# Patient Record
Sex: Male | Born: 1950 | Race: Black or African American | Hispanic: No | Marital: Married | Smoking: Never smoker
Health system: Southern US, Community
[De-identification: ages and names within clinical notes are randomized; demographics above are authoritative.]

## PROBLEM LIST (undated history)

## (undated) DIAGNOSIS — M1990 Unspecified osteoarthritis, unspecified site: Secondary | ICD-10-CM

## (undated) DIAGNOSIS — K219 Gastro-esophageal reflux disease without esophagitis: Secondary | ICD-10-CM

## (undated) DIAGNOSIS — E7800 Pure hypercholesterolemia, unspecified: Secondary | ICD-10-CM

## (undated) DIAGNOSIS — G4730 Sleep apnea, unspecified: Secondary | ICD-10-CM

## (undated) DIAGNOSIS — I2510 Atherosclerotic heart disease of native coronary artery without angina pectoris: Secondary | ICD-10-CM

## (undated) DIAGNOSIS — E119 Type 2 diabetes mellitus without complications: Secondary | ICD-10-CM

## (undated) DIAGNOSIS — E669 Obesity, unspecified: Secondary | ICD-10-CM

## (undated) DIAGNOSIS — I219 Acute myocardial infarction, unspecified: Secondary | ICD-10-CM

## (undated) DIAGNOSIS — Z98811 Dental restoration status: Secondary | ICD-10-CM

## (undated) DIAGNOSIS — K759 Inflammatory liver disease, unspecified: Secondary | ICD-10-CM

## (undated) DIAGNOSIS — I10 Essential (primary) hypertension: Secondary | ICD-10-CM

## (undated) DIAGNOSIS — I5042 Chronic combined systolic (congestive) and diastolic (congestive) heart failure: Secondary | ICD-10-CM

## (undated) HISTORY — DX: Atherosclerotic heart disease of native coronary artery without angina pectoris: I25.10

## (undated) HISTORY — DX: Acute myocardial infarction, unspecified: I21.9

## (undated) HISTORY — DX: Obesity, unspecified: E66.9

## (undated) HISTORY — DX: Essential (primary) hypertension: I10

## (undated) HISTORY — PX: TOTAL HIP ARTHROPLASTY: SHX124

## (undated) HISTORY — DX: Chronic combined systolic (congestive) and diastolic (congestive) heart failure: I50.42

## (undated) HISTORY — DX: Pure hypercholesterolemia, unspecified: E78.00

## (undated) HISTORY — DX: Unspecified osteoarthritis, unspecified site: M19.90

## (undated) HISTORY — PX: ANKLE SURGERY: SHX546

---

## 1997-11-06 ENCOUNTER — Ambulatory Visit (HOSPITAL_COMMUNITY): Admission: RE | Admit: 1997-11-06 | Discharge: 1997-11-06 | Payer: Self-pay | Admitting: Orthopedic Surgery

## 1998-05-14 ENCOUNTER — Ambulatory Visit (HOSPITAL_COMMUNITY): Admission: RE | Admit: 1998-05-14 | Discharge: 1998-05-14 | Payer: Self-pay | Admitting: Ophthalmology

## 1998-08-28 ENCOUNTER — Encounter: Payer: Self-pay | Admitting: Orthopedic Surgery

## 1998-08-28 ENCOUNTER — Ambulatory Visit (HOSPITAL_COMMUNITY): Admission: RE | Admit: 1998-08-28 | Discharge: 1998-08-28 | Payer: Self-pay | Admitting: Orthopedic Surgery

## 1999-01-13 ENCOUNTER — Encounter: Payer: Self-pay | Admitting: Orthopedic Surgery

## 1999-01-13 ENCOUNTER — Ambulatory Visit (HOSPITAL_COMMUNITY): Admission: RE | Admit: 1999-01-13 | Discharge: 1999-01-13 | Payer: Self-pay | Admitting: Orthopedic Surgery

## 1999-02-09 ENCOUNTER — Encounter: Payer: Self-pay | Admitting: Orthopedic Surgery

## 1999-02-09 ENCOUNTER — Ambulatory Visit (HOSPITAL_COMMUNITY): Admission: RE | Admit: 1999-02-09 | Discharge: 1999-02-09 | Payer: Self-pay | Admitting: Orthopedic Surgery

## 1999-04-03 ENCOUNTER — Encounter: Payer: Self-pay | Admitting: Orthopedic Surgery

## 1999-04-09 ENCOUNTER — Encounter: Payer: Self-pay | Admitting: Orthopedic Surgery

## 1999-04-09 ENCOUNTER — Inpatient Hospital Stay (HOSPITAL_COMMUNITY): Admission: RE | Admit: 1999-04-09 | Discharge: 1999-04-14 | Payer: Self-pay | Admitting: Orthopedic Surgery

## 1999-04-09 HISTORY — PX: TOTAL HIP ARTHROPLASTY: SHX124

## 1999-04-13 ENCOUNTER — Encounter: Payer: Self-pay | Admitting: Orthopedic Surgery

## 2002-05-24 DIAGNOSIS — I219 Acute myocardial infarction, unspecified: Secondary | ICD-10-CM

## 2002-05-24 HISTORY — DX: Acute myocardial infarction, unspecified: I21.9

## 2002-10-27 ENCOUNTER — Inpatient Hospital Stay (HOSPITAL_COMMUNITY): Admission: EM | Admit: 2002-10-27 | Discharge: 2002-10-31 | Payer: Self-pay | Admitting: Emergency Medicine

## 2002-10-27 ENCOUNTER — Encounter: Payer: Self-pay | Admitting: Emergency Medicine

## 2002-10-27 HISTORY — PX: CARDIAC CATHETERIZATION: SHX172

## 2002-10-27 HISTORY — PX: CORONARY STENT PLACEMENT: SHX1402

## 2002-10-28 ENCOUNTER — Encounter: Payer: Self-pay | Admitting: Cardiology

## 2002-10-29 ENCOUNTER — Encounter (INDEPENDENT_AMBULATORY_CARE_PROVIDER_SITE_OTHER): Payer: Self-pay | Admitting: Interventional Cardiology

## 2002-11-12 ENCOUNTER — Encounter (HOSPITAL_COMMUNITY): Admission: RE | Admit: 2002-11-12 | Discharge: 2003-02-10 | Payer: Self-pay | Admitting: Emergency Medicine

## 2003-02-11 ENCOUNTER — Encounter (HOSPITAL_COMMUNITY): Admission: RE | Admit: 2003-02-11 | Discharge: 2003-05-12 | Payer: Self-pay | Admitting: Interventional Cardiology

## 2005-11-04 ENCOUNTER — Encounter: Admission: RE | Admit: 2005-11-04 | Discharge: 2005-11-04 | Payer: Self-pay | Admitting: Neurosurgery

## 2010-04-13 ENCOUNTER — Ambulatory Visit (HOSPITAL_COMMUNITY): Admission: RE | Admit: 2010-04-13 | Discharge: 2010-04-13 | Payer: Self-pay | Admitting: Gastroenterology

## 2010-08-04 LAB — GLUCOSE, CAPILLARY: Glucose-Capillary: 140 mg/dL — ABNORMAL HIGH (ref 70–99)

## 2010-10-09 NOTE — Cardiovascular Report (Signed)
NAME:  Ryan Walsh, Ryan Walsh                        ACCOUNT NO.:  [id number]   MEDICAL RECORD NO.:  [id number]                   PATIENT TYPE:  INP   LOCATION:  1825                                 FACILITY:  [facility]   PHYSICIAN:  Peter M. Swaziland, M.D.               DATE OF BIRTH:  Jan 10, 1951   DATE OF PROCEDURE:  10/27/2002  DATE OF DISCHARGE:                              CARDIAC CATHETERIZATION   PROCEDURES PERFORMED:  1. Cardiac catheterization.  2. Stent procedure.   CARDIOLOGIST:  Peter M. Swaziland, M.D.   INDICATIONS FOR PROCEDURE:  [age]-year-old black male who presents  with acute anterior myocardial infarction, history of diabetes and  hypertension.   ACCESS:  Via the right femoral artery using the standard Seldinger  technique.   EQUIPMENT:  Six French 4 cm right and left Judkins catheters, 6 French  pigtail catheter, 7 [place] arterial sheath, 7 [place] JL-4 guide, 0.014 Hi-  Torque floppy wire, a 3.0 x 15 mm Maverick balloon, and a 4.0 x 15 mm Zeta  stent.   MEDICATIONS:  Local anesthesia with 1% Xylocaine.  The patient was on IV  nitroglycerin at six drops per hour.  He was already IV heparin.  He was  given an additional 1,000 units of heparin IV.  Integrelin double bolus of  0.18 mcg/kg followed by a continuous infusion of 1.99 mcg/kg/minute.  Nitroglycerin 200 mcg intracoronary times one and verapamil 200 mg  intracoronary times three.  Plavix 300 mg p.o.   CONTRAST:  One-hundred-eighty-five millimeters of Omnipaque.   HEMODYNAMIC DATA:  Aortic pressure was 107/82 with a mean of 94.  Left  ventricular pressure was 107 with an EDP of 38 mmHg.   ANGIOGRAPHIC DATA:  The left coronary artery arises and distributes  normally.   The left main coronary artery is without significant disease.   The left anterior descending artery is a very large branch and is occluded  in the midvessel after the takeoff of the first septal perforator and first  diagonal branches.   It has a positive nipple sign.  There is faint left-to-  left collaterals to the far distal LAD.   The left circumflex coronary artery has moderate wall irregularities, less  than 10%.  It gives rise to two large marginal branches.   The right coronary artery arises and distributes normally.  It appears  normal.   Left ventricular angiography was performed at the end of the procedure and  demonstrated normal left ventricular  size with anterolateral akinesia.  Overall left ventricular function was moderately reduced with ejection  fraction estimated at 40%.  There is no mitral regurgitation or prolapse.   We proceeded with emergent stenting of the mid LAD.  This lesion was crossed  with moderate difficulty with the floppy wire.  We then performed  predilatation with a 3.0 mm balloon up to eight atmospheres.  This resulted  in restoration of flow down  the LAD, which is a very large vessel wrapping  around the apex.  Initially, the patient's ST segment changes improved, but  then he suddenly developed hyper acute ST elevation followed by worsening  chest pain.  We proceeded with stenting of the mid LAD.   We used a 4.0 x 15 mm Zeta stent.  There were no appropriate size of drug  eluding stents to use.  The stent was deployed at eight atmospheres and post  dilated to 12 atmospheres.  This yielded excellent stent expansion with  excellent angiographic result with 0% residual stenosis and TIMI grade III  flow to the distal LAD.  The patient's pain progressively improved.  He  still had some ST elevation on monitor, but this also appeared to improve.  He was given intracoronary nitroglycerin and intracoronary verapamil for no  reflow.  He remained hemodynamically stable throughout his case with no  significant arrhythmias.   FINAL INTERPRETATION:  1. Single-vessel occlusive atherosclerotic coronary artery disease.  2. Moderate left ventricular dysfunction.  3. Successful stenting of the  mid left anterior descending.                                                  Peter M. Swaziland, M.D.    PMJ/MEDQ  D:  10/27/2002  T:  10/27/2002  Job:  [id number]   cc:   Lesleigh Noe, M.D.  [address]  [address]  [place]  [place] [address]  Fax: [phone]

## 2010-10-09 NOTE — Op Note (Signed)
[place]. [facility]  Patient:    Ryan Walsh                      MRN: [id number] Proc. Date: 04/09/99 Adm. 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  PROCEDURE:  Left total hip replacement utilizing Osteonics prosthesis. Press-fit 52 mm acetabular component with screw fixation x two and Crossfire polyethylene  insert 28 mm internal diameter, 10 degree overhang, press-fit #9 femoral component Omniflex plus type, AH coated.  A 15 mm distal tibia, +5 head.  SURGEON:  Loreta Ave, M.D.  ASSISTANT:  Arlys John D. Petrarca, P.A.-C.  ANESTHESIA:  General.  BLOOD LOSS:  500 cc.  BLOOD GIVEN:  None.  SPECIMEN:  Excised bone and soft tissue.  CULTURES:  None.  COMPLICATIONS:  None.  DRESSING:  Soft compressive with abduction pillow.  PROCEDURE:  Patient brought to the operating room, placed on the operating table in supine position.  After adequate anesthesia had been obtained, turned to a lateral position, prepped and draped in usual sterile fashion.  Lateral incision extending posterior-superior from trochanter.  Skin and subcutaneous tissue divided. Hemostasis obtained with electrocautery.  Iliotibial band exposed, incised and Charnley retractor put in place.  Neurovascular structures identified, protected and retracted.  External rotators and capsule taken down off the back of the intertrochanteric groove, tagged with #1 Ethibond.  Hip dislocated.  Grade IV changes throughout.  Femoral head removed one fingerbreadth above the lesser trochanter in line with a definitive component.  Acetabulum exposed.  Debris clear.  Sequential reaming up to 52 mm, which gave me nice bleeding bone throughout and appropriate central and medialization.   Definitive cup placed at 45 degrees of abduction, 20 of anteversion.  Excellent capturing and fixation.  For further fixation, two screws placed, a 20 mm screw above and a 16 mm posterosuperior. Polyethylene attached with the 10 degree overhang placed posterosuperior. Excellent capturing, fixation and alignment.  Femur exposed.  Sequential reaming for a #9 component.  Power distal reamers to 15 mm.  At completion, I had excellent capturing, fixation and fitting throughout.  I tested for stability with reduction and for length.  A +5 head deemed as the best with the #9 component.  All trials removed.   Copious irrigation.  Definitive components assembled and the #9 component hammered in place with a 15 mm distal tip attached.  A +5 head attached. The hip was reduced.  Stable in flexion and extension.  Good restoration of leg  lengths bilaterally.  Wound copiously irrigated.  External rotators and capsule  repaired through drill holes at the back of the intertrochanteric groove and sutures tied over bone.  Wound irrigated.  Charnley retractor removed.  The iliotibial band closed with #1 Vicryl, skin and subcutaneous tissue with Vicryl and staples.  Margins of the wound injected with Marcaine.  Sterile compressive dressing applied, returned to the supine position, abduction pillow placed. Anesthesia reversed, brought to recovery room, tolerated surgery well, no complications. DD:  04/09/99 TD:  04/10/99 Job: [id number]

## 2010-10-09 NOTE — Procedures (Signed)
[place]. [facility]  Patient:    Ryan Walsh                      MRN: [id number] Proc. Date: 04/09/99 Adm. Date:  [id number] Attending:  Colbert Ewing                           Procedure Report  PROCEDURE:  Epidural catheter placement.  HISTORY:  Mr. Axel Frisk is a 60 year old black male who was status post total hip replacement but was unable to get comfortable on his IV morphine.  Dr. Eulah Pont requested that I place a postoperative epidural catheter in the PACU.  DESCRIPTION OF PROCEDURE:  The patient was placed in the right lateral decubitus position.  This procedure was discussed with the patient and he gave informed consent to proceed.  The lumbar spine was sterilely prepped and draped. Lidocaine 1% was used to anesthetize the skin.  A 70 gauge Tuohy needle was advanced through loss of resistance at the L4-L5 interspace.  The epidural space was located and the epidural needle did not aspirate blood or CSF.  An epidural catheter was then inserted into the epidural space.  The needle was removed and the catheter did ot aspirate blood or CSF.  The catheter was secured in place.  A 5 cc test dose of 2% lidocaine plus epinephrine was negative.  Then the patient was started on a Marcaine/fentanyl infusion.  The patient tolerated the procedure well. DD:  04/09/99 TD:  04/09/99 Job: [id number]

## 2010-10-09 NOTE — H&P (Signed)
NAME:  Ryan Walsh, Ryan Walsh NO.:  [id number]   MEDICAL RECORD NO.:  [id number]                   PATIENT TYPE:  INP   LOCATION:  1825                                 FACILITY:  [facility]   PHYSICIAN:  Peter M. Swaziland, M.D.               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  He presents with sudden onset of severe substernal chest pain  at 4 a.m. today.  He had no radiation of his pain.  No nausea, vomiting,  shortness of breath, or diaphoresis.  Pain is located in the mid sternal  area, described as severe, crushing pain.  He took Tums and ibuprofen at  home without relief and then presented to the emergency room.  He has  persistent, 9/10 pain.  He has no prior history of myocardial infarction.  He does report one episode of chest pain approximately one week ago that was  not as severe.   PAST MEDICAL HISTORY:  1. Significant for hypertension.  2. Diabetes mellitus type 2.  3. Osteoarthritis.   PAST SURGICAL HISTORY:  1. He is status post bilateral hip replacement surgery.  2. He has also had a previous surgery for a fractured left ankle.   ALLERGIES:  He has no known allergies.   MEDICATIONS:  1. Norvasc 10 mg per day.  2. Amaryl 2 mg per day.  3. Glucophage 1000 mg per day.  4. HCTZ 25 mg per day.   SOCIAL HISTORY:  Patient is married.  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  He has two children.  He denies smoking or alcohol use.   FAMILY HISTORY:  Noncontributory.   REVIEW OF SYSTEMS:  Otherwise negative.   PHYSICAL EXAMINATION:  GENERAL:  Patient is a well-developed black male in  moderate distress.  VITAL SIGNS:  Blood pressure is 137/90, pulse 96 and regular,  respirations  are 22.  HEENT EXAM:  Pupils are equal, round, and reactive to light and  accommodation.  Extraocular movements are full.  Oropharynx is clear.  NECK:  Without JVD, adenopathy, or bruits.  LUNGS:  Clear.  CARDIAC EXAM:  Reveals regular rate and rhythm, normal S1 and S2 with a  positive S4.  There are no murmurs or S3.  ABDOMEN:  Soft and nontender without masses or bruits.  EXTREMITIES:  Femoral and pedal pulses are 2+ and symmetric.  He has no  edema or cyanosis.  NEUROLOGIC EXAM:  Grossly intact.   LABORATORY DATA:  ECG shows normal sinus rhythm with LVH.  There is ST  elevation in leads I, aVL, V2 through V5, consistent with acute  anterolateral myocardial infarction.  IMPRESSION:  1. Acute anterolateral myocardial infarction.  2. Diabetes mellitus type 2.  3. Hypertension.  4. Status post bilateral hip replacement surgery.   PLAN:  The patient is treated emergently with aspirin, IV heparin,  nitroglycerin, IV Lopressor, and morphine.  He will be taken emergently for  cardiac catheterization and angioplasty.                                               Peter M. Swaziland, M.D.    PMJ/MEDQ  D:  10/27/2002  T:  10/27/2002  Job:  [id number]   cc:   Lesleigh Noe, M.D.  [address]  [address]  [place]  [place] [address]  Fax: [phone]

## 2010-10-09 NOTE — Discharge Summary (Signed)
NAME:  Ryan Walsh, Ryan Walsh                        ACCOUNT NO.:  [id number]   MEDICAL RECORD NO.:  [id number]                   PATIENT TYPE:  INP   LOCATION:  2036                                 FACILITY:  [facility]   PHYSICIAN:  Lyn Records, M.D.                DATE OF BIRTH:  [birth year]   DATE OF ADMISSION:  10/27/2002  DATE OF DISCHARGE:  10/31/2002                                 DISCHARGE SUMMARY   ADMISSION DIAGNOSES:  1. Acute myocardial infarction - anterior.  2. Hypertension.  3. Diabetes mellitus type 2.  4. Osteoarthritis.   DISCHARGE DIAGNOSES:  1. Acute anterior myocardial infarction, status post ERLAD percutaneous     coronary intervention.  2. Hyperlipidemia - statin therapy initiated this hospitalization.  3. Elevated C reactive protein at 4.8, normal homocystine level.  4. Left ventricular dysfunction, ejection fraction about 40%.   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  No radiation of the discomfort.  He denied nausea, vomiting,  shortness of breath or diaphoresis.  He took Tums and ibuprofen without  relief.  The pain was persistent at a 9/10.  No prior history of MI.  He had  an episode of chest pain last week but not as severe.   In the emergency room, EKG showed normal sinus rhythm with LVH and ST  elevation in I, aVL, V2 through V5 consistent with anterolateral MI.   The patient will be treated with aspirin, IV heparin, nitroglycerin, IV  Lopressor, morphine.  He will be taken emergently for cardiac  catheterization intervention.  The risks and benefits were reviewed with the  patient and he agreed to proceed.  This was performed by Dr. Swaziland on call  for Dr. Katrinka Blazing.   PROCEDURE:   Emergency cardiac catheterization with intervention to the LAD  by Dr. Swaziland on October 27, 2002.   COMPLICATIONS:  None.   CONSULTATIONS:  1. Cardiac Rehab.  2. Diabetes coordinator.   HOSPITAL COURSE:  Ryan Walsh was admitted to [facility]  on October 27, 2002, with diagnosis of acute myocardial infarction.  He was  treated with IV heparin, nitroglycerin, morphine, Lopressor, and aspirin.   Cardiac catheterization performed by Dr. Swaziland on call for Dr. Katrinka Blazing  revealed the left main to be okay, LAD 100% occluded in the mid vessel after  the FP-1.  Circumflex normal. RCA normal.  LV with anterolateral akinesis,  EF about 40%.  EDP 38 mmHg.   Dr. Swaziland preceded with intervention to the LAD after double bolus of  Integrelin and then infusion.  The patient was also given 300 mg of Plavix.  A  4.0 x 15 Zeta stent was placed.  Initially the patient developed  hyperacute ST elevation with worsening chest pain and chest pain resolved  after balloon inflation.  He was given 200 mcg of intracoronary  nitroglycerin and 600 mg of intracoronary verapamil.  He had an excellent  angiographic result with reduction of lesion to 0% residual in TIMI-3 flow.  Chest pain resolved.  ST elevation persisted but improved.  The patient was  transferred to the CCU for further management.   Cardiac enzyme series as follows.  CK 210, 4487, 2669; MB 4.2, 345.6, 211.1;  troponin I 0.15.  C reactive protein elevated at 4.8 - high risk.  Homocystine 10.07 (within normal limits).  Hemoglobin A1C 6.5.   Admission labs showed a WBC of 9.6, hemoglobin 15, platelets 221.  Sodium  141, potassium 3.5, BUN 8, creatinine 1.0.   Total cholesterol 219, triglycerides 65, ACL 54, and LDL 152.   Ryan Walsh remained cardiovascularly stable post catheterization.  No  problems with sheath pull.  No further chest pain.   The patient was evaluated by cardiac rehab on October 29, 2002, and the patient  was referred  to cardiac rehab, phase II.  He was also seen by a diabetes  coordinator who will help to arrange a one on one follow-up with  nutritionist for diabetes and nutrition management.   On October 30, 2002, the patient was transferred out of the unit and was  ambulating without difficulty.  No recurrent chest pain.  A 2-D echo  performed on October 29, 2002, revealed mildly decreased LV systolic function  with an EF of 40 to 50%.  Moderate HK of the anterior apical wall.  Left  ventricular wall thickness was moderately increased.  Mild left atrial  enlargement.  Aortic valve thickness mildly increased.   LFTs were drawn on October 31, 2002, and showed an SGOT of 61 and SGPT of 67.  Other LFTs were within normal limits.  These two values were about three  times normal.  For this reason, we decreased the dose of Lipitor to 20 mg a  day and will follow up statin profile in about six weeks.   Dr. Katrinka Blazing felt the patient was stable for discharge to home on October 31, 2002.   DISCHARGE MEDICATIONS:  1. Nitroglycerin 0.4 mg one under the tongue every five minutes as needed     for chest pain.  Call 911 if no relief with three.  2. Altace 10 mg a day.  3. Lipitor 20 mg a day.  4. Coreg 12.5 mg b.i.d.  5. Plavix 75 mg a day.  6. Ambien 10 mg at bedtime as needed for sleep.  7. Enteric coated aspirin 325 mg a day.  8. Amaryl 2 mg a day.  9. Hydrochlorothiazide 25 mg one half pill a day.  10.      Glucophage 1000 mg at bedtime.  11.      The patient is to stop his Norvasc.   ACTIVITY:  No strenuous activity or lifting more than five pounds.  No work  until seen by Dr. Katrinka Blazing.   DIET:  Low fat, low cholesterol, diabetic diet.  Low salt.   FOLLOW UP:  1. Recommend cardiac rehab phase II.  2. [facility] will call the patient to     schedule one on one dietician follow-up.  3. Dr. Katrinka Blazing on Monday, November 12, 2002, at 2 o'clock. 4. Dr. Lurene Shadow on Thursday, November 15, 2002, at 9:15 at  [facility] at     [facility].  5. The patient is to have a fasting cholesterol profile drawn on Tuesday,     November 27, 2002, any time after 7:30 in the morning.  6. On Wednesday, November 28, 2002, at 10:15, the patient will be seen in the     cholesterol clinic by [facility] __________ at Dr. Michaelle Copas office.   DISCHARGE MEDICATIONS:  Prescriptions were given for the patient including:  1. Nitroglycerin 0.4 mg one pill as needed for chest pain #25 with 11     refills.  2. Coreg 12.5 mg one p.o. b.i.d. #60 with 11 refills.  3. Plavix 75 mg one p.o. daily #30 with 11 refills.  4. Lipitor 20 mg one p.o. q.h.s. #30 with 11 refills.  5. Altace 10 mg one p.o. daily. #30 with 11 refills.  6. Ambien one p.o. q.h.s. p.r.n. #30 with one refill.     Georgiann Cocker Jernejcic, P.A.                   Lyn Records, M.D.    TCJ/MEDQ  D:  10/31/2002  T:  10/31/2002  Job:  [id number]   cc:   Leonie Man, M.D.  [address]  [place]  [place] [address]  Fax: [phone]

## 2010-11-09 ENCOUNTER — Other Ambulatory Visit (HOSPITAL_COMMUNITY): Payer: Self-pay | Admitting: Interventional Cardiology

## 2010-11-09 DIAGNOSIS — I2510 Atherosclerotic heart disease of native coronary artery without angina pectoris: Secondary | ICD-10-CM

## 2010-12-17 ENCOUNTER — Other Ambulatory Visit (HOSPITAL_COMMUNITY): Payer: Self-pay | Admitting: Interventional Cardiology

## 2010-12-17 ENCOUNTER — Ambulatory Visit (HOSPITAL_COMMUNITY)
Admission: RE | Admit: 2010-12-17 | Discharge: 2010-12-17 | Disposition: A | Discharge status: Home / Self Care | Payer: 59 | Source: Ambulatory Visit | Attending: Interventional Cardiology | Admitting: Interventional Cardiology

## 2010-12-17 ENCOUNTER — Encounter (HOSPITAL_COMMUNITY)
Admission: RE | Admit: 2010-12-17 | Discharge: 2010-12-17 | Disposition: A | Discharge status: Home / Self Care | Payer: 59 | Source: Ambulatory Visit | Attending: Interventional Cardiology | Admitting: Interventional Cardiology

## 2010-12-17 DIAGNOSIS — E119 Type 2 diabetes mellitus without complications: Secondary | ICD-10-CM | POA: Insufficient documentation

## 2010-12-17 DIAGNOSIS — E7800 Pure hypercholesterolemia, unspecified: Secondary | ICD-10-CM | POA: Insufficient documentation

## 2010-12-17 DIAGNOSIS — I10 Essential (primary) hypertension: Secondary | ICD-10-CM | POA: Insufficient documentation

## 2010-12-17 DIAGNOSIS — I2510 Atherosclerotic heart disease of native coronary artery without angina pectoris: Secondary | ICD-10-CM

## 2010-12-17 DIAGNOSIS — R079 Chest pain, unspecified: Secondary | ICD-10-CM | POA: Insufficient documentation

## 2010-12-17 DIAGNOSIS — I252 Old myocardial infarction: Secondary | ICD-10-CM | POA: Insufficient documentation

## 2010-12-17 MED ORDER — TECHNETIUM TC 99M TETROFOSMIN IV KIT
30.0000 | PACK | Freq: Once | INTRAVENOUS | Status: AC | PRN
  Administered 2010-12-17: 30 via INTRAVENOUS

## 2010-12-17 MED ORDER — TECHNETIUM TC 99M TETROFOSMIN IV KIT
10.0000 | PACK | Freq: Once | INTRAVENOUS | Status: AC | PRN
Start: 2010-12-17 — End: 2010-12-17
  Administered 2010-12-17: 10 via INTRAVENOUS

## 2011-01-22 ENCOUNTER — Ambulatory Visit (HOSPITAL_COMMUNITY)
Admission: RE | Admit: 2011-01-22 | Discharge: 2011-01-22 | Disposition: A | Discharge status: Home / Self Care | Payer: 59 | Source: Ambulatory Visit | Attending: Orthopedic Surgery | Admitting: Orthopedic Surgery

## 2011-01-22 ENCOUNTER — Other Ambulatory Visit (HOSPITAL_COMMUNITY): Payer: Self-pay | Admitting: Orthopedic Surgery

## 2011-01-22 ENCOUNTER — Other Ambulatory Visit: Payer: Self-pay | Admitting: Orthopedic Surgery

## 2011-01-22 ENCOUNTER — Encounter (HOSPITAL_COMMUNITY): Payer: 59

## 2011-01-22 DIAGNOSIS — M1710 Unilateral primary osteoarthritis, unspecified knee: Secondary | ICD-10-CM | POA: Insufficient documentation

## 2011-01-22 DIAGNOSIS — Z01818 Encounter for other preprocedural examination: Secondary | ICD-10-CM

## 2011-01-22 DIAGNOSIS — Z01812 Encounter for preprocedural laboratory examination: Secondary | ICD-10-CM | POA: Insufficient documentation

## 2011-01-22 DIAGNOSIS — E119 Type 2 diabetes mellitus without complications: Secondary | ICD-10-CM | POA: Insufficient documentation

## 2011-01-22 DIAGNOSIS — I10 Essential (primary) hypertension: Secondary | ICD-10-CM | POA: Insufficient documentation

## 2011-01-22 LAB — CBC: Platelets: 193 10*3/uL (ref 150–400)

## 2011-01-22 LAB — COMPREHENSIVE METABOLIC PANEL
AST: 25 U/L (ref 0–37)
CO2: 28 mEq/L (ref 19–32)
Chloride: 99 mEq/L (ref 96–112)
Creatinine, Ser: 0.68 mg/dL (ref 0.50–1.35)
Sodium: 137 mEq/L (ref 135–145)
Total Bilirubin: 0.4 mg/dL (ref 0.3–1.2)

## 2011-01-22 LAB — URINALYSIS, ROUTINE W REFLEX MICROSCOPIC
Bilirubin Urine: NEGATIVE
Hgb urine dipstick: NEGATIVE
Leukocytes, UA: NEGATIVE

## 2011-01-22 LAB — PROTIME-INR: INR: 1.15 (ref 0.00–1.49)

## 2011-01-22 LAB — SURGICAL PCR SCREEN: Staphylococcus aureus: NEGATIVE

## 2011-02-03 ENCOUNTER — Inpatient Hospital Stay (HOSPITAL_COMMUNITY)
Admission: RE | Admit: 2011-02-03 | Discharge: 2011-02-08 | DRG: 470 | Disposition: A | Discharge status: Home Health | Payer: 59 | Source: Ambulatory Visit | Attending: Orthopedic Surgery | Admitting: Orthopedic Surgery

## 2011-02-03 DIAGNOSIS — I252 Old myocardial infarction: Secondary | ICD-10-CM

## 2011-02-03 DIAGNOSIS — Z6838 Body mass index (BMI) 38.0-38.9, adult: Secondary | ICD-10-CM

## 2011-02-03 DIAGNOSIS — E119 Type 2 diabetes mellitus without complications: Secondary | ICD-10-CM | POA: Diagnosis present

## 2011-02-03 DIAGNOSIS — Z96649 Presence of unspecified artificial hip joint: Secondary | ICD-10-CM

## 2011-02-03 DIAGNOSIS — I5020 Unspecified systolic (congestive) heart failure: Secondary | ICD-10-CM | POA: Diagnosis present

## 2011-02-03 DIAGNOSIS — I2510 Atherosclerotic heart disease of native coronary artery without angina pectoris: Secondary | ICD-10-CM | POA: Diagnosis present

## 2011-02-03 DIAGNOSIS — M1710 Unilateral primary osteoarthritis, unspecified knee: Principal | ICD-10-CM | POA: Diagnosis present

## 2011-02-03 DIAGNOSIS — E669 Obesity, unspecified: Secondary | ICD-10-CM | POA: Diagnosis present

## 2011-02-03 DIAGNOSIS — Z01812 Encounter for preprocedural laboratory examination: Secondary | ICD-10-CM

## 2011-02-03 DIAGNOSIS — I509 Heart failure, unspecified: Secondary | ICD-10-CM | POA: Diagnosis present

## 2011-02-03 DIAGNOSIS — M21069 Valgus deformity, not elsewhere classified, unspecified knee: Secondary | ICD-10-CM | POA: Diagnosis present

## 2011-02-03 DIAGNOSIS — E785 Hyperlipidemia, unspecified: Secondary | ICD-10-CM | POA: Diagnosis present

## 2011-02-03 DIAGNOSIS — Z9861 Coronary angioplasty status: Secondary | ICD-10-CM

## 2011-02-03 DIAGNOSIS — Z79899 Other long term (current) drug therapy: Secondary | ICD-10-CM

## 2011-02-03 DIAGNOSIS — I10 Essential (primary) hypertension: Secondary | ICD-10-CM | POA: Diagnosis present

## 2011-02-03 DIAGNOSIS — Z7902 Long term (current) use of antithrombotics/antiplatelets: Secondary | ICD-10-CM

## 2011-02-03 HISTORY — PX: TOTAL KNEE ARTHROPLASTY: SHX125

## 2011-02-03 LAB — GLUCOSE, CAPILLARY
Glucose-Capillary: 205 mg/dL — ABNORMAL HIGH (ref 70–99)
Glucose-Capillary: 171 mg/dL — ABNORMAL HIGH (ref 70–99)
Glucose-Capillary: 185 mg/dL — ABNORMAL HIGH (ref 70–99)
Glucose-Capillary: 182 mg/dL — ABNORMAL HIGH (ref 70–99)

## 2011-02-03 LAB — TYPE AND SCREEN: Antibody Screen: NEGATIVE

## 2011-02-03 LAB — ABO/RH: ABO/RH(D): B POS

## 2011-02-04 LAB — CBC
HCT: 32 % — ABNORMAL LOW (ref 39.0–52.0)
Hemoglobin: 10.6 g/dL — ABNORMAL LOW (ref 13.0–17.0)
MCH: 27.5 pg (ref 26.0–34.0)
MCHC: 33.1 g/dL (ref 30.0–36.0)
MCV: 82.9 fL (ref 78.0–100.0)
Platelets: 126 K/uL — ABNORMAL LOW (ref 150–400)
RBC: 3.86 MIL/uL — ABNORMAL LOW (ref 4.22–5.81)
RDW: 14 % (ref 11.5–15.5)
WBC: 9.2 K/uL (ref 4.0–10.5)

## 2011-02-04 LAB — GLUCOSE, CAPILLARY
Glucose-Capillary: 235 mg/dL — ABNORMAL HIGH (ref 70–99)
Glucose-Capillary: 181 mg/dL — ABNORMAL HIGH (ref 70–99)

## 2011-02-04 LAB — BASIC METABOLIC PANEL
Creatinine, Ser: 0.92 mg/dL (ref 0.50–1.35)
GFR calc Af Amer: >60 mL/min (ref >60)
Sodium: 135 mEq/L (ref 135–145)

## 2011-02-05 LAB — BASIC METABOLIC PANEL
BUN: 8 mg/dL (ref 6–23)
Chloride: 97 mEq/L (ref 96–112)
GFR calc Af Amer: >60 mL/min (ref >60)
Potassium: 3.1 mEq/L — ABNORMAL LOW (ref 3.5–5.1)
Sodium: 133 mEq/L — ABNORMAL LOW (ref 135–145)

## 2011-02-05 LAB — GLUCOSE, CAPILLARY
Glucose-Capillary: 185 mg/dL — ABNORMAL HIGH (ref 70–99)
Glucose-Capillary: 256 mg/dL — ABNORMAL HIGH (ref 70–99)
Glucose-Capillary: 245 mg/dL — ABNORMAL HIGH (ref 70–99)
Glucose-Capillary: 197 mg/dL — ABNORMAL HIGH (ref 70–99)

## 2011-02-05 LAB — CBC
HCT: 29 % — ABNORMAL LOW (ref 39.0–52.0)
MCHC: 33.1 g/dL (ref 30.0–36.0)
Platelets: 108 10*3/uL — ABNORMAL LOW (ref 150–400)
WBC: 9.9 10*3/uL (ref 4.0–10.5)

## 2011-02-06 LAB — GLUCOSE, CAPILLARY
Glucose-Capillary: 244 mg/dL — ABNORMAL HIGH (ref 70–99)
Glucose-Capillary: 227 mg/dL — ABNORMAL HIGH (ref 70–99)
Glucose-Capillary: 243 mg/dL — ABNORMAL HIGH (ref 70–99)

## 2011-02-06 LAB — BASIC METABOLIC PANEL
Chloride: 97 mEq/L (ref 96–112)
GFR calc Af Amer: >60 mL/min (ref >60)
GFR calc non Af Amer: >60 mL/min (ref >60)
Potassium: 3.8 mEq/L (ref 3.5–5.1)
Sodium: 135 mEq/L (ref 135–145)

## 2011-02-06 LAB — CBC
MCHC: 32.3 g/dL (ref 30.0–36.0)
RDW: 13.6 % (ref 11.5–15.5)
WBC: 10 10*3/uL (ref 4.0–10.5)

## 2011-02-07 ENCOUNTER — Inpatient Hospital Stay (HOSPITAL_COMMUNITY): Payer: 59

## 2011-02-07 LAB — URINALYSIS, ROUTINE W REFLEX MICROSCOPIC
Ketones, ur: NEGATIVE mg/dL
Leukocytes, UA: NEGATIVE
Nitrite: NEGATIVE
Specific Gravity, Urine: 1.009 (ref 1.005–1.030)
pH: 6.5 (ref 5.0–8.0)

## 2011-02-07 LAB — GLUCOSE, CAPILLARY
Glucose-Capillary: 290 mg/dL — ABNORMAL HIGH (ref 70–99)
Glucose-Capillary: 215 mg/dL — ABNORMAL HIGH (ref 70–99)

## 2011-02-07 LAB — URINE MICROSCOPIC-ADD ON: Urine-Other: NONE SEEN

## 2011-02-10 NOTE — Op Note (Signed)
NAMECLAYSON, Ryan Walsh NO.:  [id number]  MEDICAL RECORD NO.:  [id number]  LOCATION:  1619                         FACILITY:  [facility]  PHYSICIAN:  Ollen Gross, M.D.    DATE OF BIRTH:  [birth year]  DATE OF PROCEDURE: DATE OF DISCHARGE:                              OPERATIVE REPORT   PREOPERATIVE DIAGNOSIS:  Osteoarthritis, left knee.  POSTOPERATIVE DIAGNOSIS:  Osteoarthritis, left knee.  PROCEDURE:  Left total knee arthroplasty.  SURGEON:  Ollen Gross, M.D.  ASSISTANT:  Alexzandrew L. Perkins, P.A.C.  ANESTHESIA:  General.  ESTIMATED BLOOD LOSS:  Minimal.  DRAIN:  Hemovac x1.  TOURNIQUET TIMES:  60 minutes at 300 mmHg.  COMPLICATIONS:  None.  CONDITION:  Stable to recovery.  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  He has a significant valgus deformity with bone-on-bone in his lateral patellofemoral compartments.  He has failed nonoperative management and presents for total knee arthroplasty.  PROCEDURE IN DETAIL:  After successful administration of general anesthetic, a tourniquet was placed high on his left thigh and his left lower extremity was prepped and draped in the usual sterile fashion. Extremity was wrapped in an Esmarch, knee flexed, tourniquet inflated to 300 mmHg.  Midline incision was made with a 10 blade through subcutaneous tissue to the level of the extensor mechanism.  Fresh blade was used to make a medial parapatellar arthrotomy.  We did not do any soft tissue stripping medially given that the defect was lateral.  I then made a small lateral arthrotomy just adjacent to the lateral edge of patellar tendon and coursing up to below the area of the inferolateral geniculate.  I subperiosteally elevated all the soft tissue off the proximal lateral tibia around to the posterolateral corner, but not including the structures of  the posterolateral corner. We then everted the patella laterally, knees was flexed to 90 degrees. Horrific tricompartmental arthritis with a large defect in the tibia laterally.  The ACL was already gone.  I checked out the PCL.  Drill was used to create a starting hole in the distal femur and the canal was thoroughly irrigated.  5 degree left valgus alignment guide was placed. The distal femoral cutting block was pinned to remove 12 mm off the distal femur.  At the 12 because of a large flexion contracture preoperatively.  Distal femoral resection was made with an oscillating saw.  Sizing blocks placed, size 5 was most appropriate.  Rotation was marked off the epicondylar axis.  The size 5 cutting block was placed and the anterior and posterior chamfer cuts were made.  Tibia subluxed forward and the menisci removed.  The extramedullary tibial alignment guide was placed referencing proximally at the medial aspect of the tibial tubercle and distally along the second metatarsal axis and tibial crest.  Block was pinned to remove about 4 mm off the medial side.  Medial side was somewhat deficient, but nowhere there was much as the lateral.  If I had resected off the lateral side we would be taking close to a centimeter and a half of the tibia.  Resection was  made with an oscillating saw.  Size 5 was most appropriate.  I prepared proximally with the modular drill and then the drill for the MBT revision component of the size 5.  We then did a keel punch.  I needed to cut down approximately five more mm laterally to get to the base of the defect.  I did a resection and then we placed a 5 mm augment on to the size 5 MBT trial.  This augments on the lateral side.  I then placed a 13 x 30 stem extension also.  I placed a trial and it was perfect fit on the cut bone surfaces.  We then did a keel punch and left the trial in place.  Then the intercondylar cut for the femoral component with a size 5.   Size 5 posterior stabilized femoral trial was then placed.  A 12.5 mm posterior stabilized rotating platform insert trial was placed with a 12.5 full extension was then achieved with excellent varus-valgus the anterior-posterior balance throughout full range of motion.  The alignment was corrected back to neutral alignment.  Patella was again everted, thickness measured to be 25 mm.  Freehand resection taken to 15 mm, 38 template was placed, lug holes were drilled, trial patella was placed and it tracks normally.  Osteophytes removed off the posterior femur with the trial in place.  All trials were removed and the cut bone surfaces prepared with pulsatile lavage.  I trialed the cement restrictor and size 4  is most appropriate, it was placed in the appropriate depth in the tibial canal.  Cement was mixed and once ready for implantation, was placed into the tibia and then the tibial components impacted and all extruded cement removed.  Then impacted the cemented and femoral component and patella, held the patella with a clamp.  A 12.5-mm trial inserts placed, knee held in full extension all extruded cement removed.  Cement fully hardened, then the permanent 12.5 mm posterior stabilized rotating platform insert was placed in the tibial tray.  The wound was copiously irrigated with saline solution and the medial arthrotomy closed with interrupted #1 PDS.  Flexion against gravity to 135 degrees.  I then closed hardened lateral arthrotomy leaving open the area adjacent to the inferior pole of patella.  Flexion against gravity still 135 degrees patella tracks normally.  Tourniquet released total time of 60 minutes.  Very minimal bleeding was encountered.  Subcu was then closed with interrupted 2-0 Vicryl and subcuticular running 4-0 Monocryl.  Catheter for the Marcaine pain pump was placed and pumps initiated.  Drains hooked to suction.  Incision cleaned and dried and Steri-Strips and a bulky  sterile dressing were applied.  He is then awakened and transported to recovery in stable condition.     Ollen Gross, M.D.     FA/MEDQ  D:  02/03/2011  T:  02/03/2011  Job:  [id number]  Electronically Signed by Ollen Gross M.D. on 02/10/2011 10:09:06 AM

## 2011-02-10 NOTE — H&P (Signed)
Ryan Walsh, Ryan Walsh NO.:  [id number]  MEDICAL RECORD NO.:  [id number]  LOCATION:  0004                         FACILITY:  [facility]  PHYSICIAN:  Ryan Walsh, M.D.    DATE OF BIRTH:  07-10-1950  DATE OF ADMISSION:  02/03/2011 DATE OF DISCHARGE:                             HISTORY & PHYSICAL   CHIEF COMPLAINT:  Left knee pain.  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 Lequita Walsh for ongoing knee pain.  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  His knee pain has progressively gotten worse with time and is started to interfere his mobility and activity.  It is limiting what he can and cannot do and he is still able to work, but it has been becoming much more difficult at times.  He had both hips replaced in the past by Dr. Eulah Walsh, and is doing fine with those and knees are his biggest problem at this point.  He is seen in the office where x-rays have shown severe end-stage arthritis of the left knee with about 20-degree valgus deformity.  He also has tricompartmental bone-on-bone with the valgus deformity.  The right knee shows a lot of arthritic changes, but to a lesser degree as compared to the left knee.  It is felt that he would benefit undergoing surgical intervention.  Risks and benefits have been discussed.  He elects to proceed with surgery.  There is no contraindications such as ongoing infection or any kind of neurological progressive disease.  ALLERGIES:  No known drug allergies.  CURRENT MEDICATIONS: 1. Plavix 75 mg daily, stop 5 days prior to surgery. 2. Coreg 25 mg p.o. b.i.d. 3. Ramipril 10 mg p.o. b.i.d. 4. Amlodipine 5 mg p.o. daily. 5. Hydrochlorothiazide 25 mg daily. 6. Metformin 2000 mg daily with dinner. 7. Lipitor 40 mg p.o. q.h.s.  PAST MEDICAL HISTORY: 1. Coronary arterial disease. 2. History of  myocardial infarction in 2004, which was an anterior MI. 3. Hypertension. 4. Non-insulin-dependent diabetes mellitus. 5. Hyperlipidemia. 6. History of systolic heart failure, ejection fraction approximately     40% per echo in December 2011. 7. Obesity.  PAST SURGICAL HISTORY: 1. Bilateral total hip replacements in 1998 and 2000 per Ryan Walsh. 2. Cardiac catheterization with one stent. 3. Left ankle ORIF during his childhood years.  FAMILY HISTORY:  Father deceased at age 64.  Mother also deceased. Grandfather with diabetes.  SOCIAL HISTORY:  Married.  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 Past smoker, 1 to 2 drinks of wine daily.  He has 2 children.  He does have a caregiver lined up.  He has 2 steps entering his home.  He also has a living will.  REVIEW OF SYSTEMS:  GENERAL:  No fevers, chills, or night sweats. NEURO:  No seizures, syncope, or paralysis.  RESPIRATORY:  No shortness breath, productive cough, or hemoptysis.  CARDIOVASCULAR:  No chest pain or orthopnea.  GI:  No nausea, vomiting, diarrhea, or constipation.  GU: No dysuria, hematuria, or discharge.  MUSCULOSKELETAL:  Knee pain.  PHYSICAL EXAMINATION:  VITAL SIGNS:  Pulse 72, respirations 12, blood pressure 130/90. GENERAL:  A 60 year old [place] American male, well-nourished, well- developed, no acute distress.  He is alert, oriented, cooperative, overweight.  Excellent historian. HEENT:  Normocephalic, atraumatic.  Pupils are round and reactive.  EOMs intact.  Noted to wear glasses. NECK:  Supple.  No carotid bruits are appreciated. CHEST:  Clear anterior posterior chest walls.  No rhonchi, rales, or wheezing. HEART:  Regular rate and rhythm without murmur.  S1 and S2 noted. ABDOMEN:  Round, slight protuberant abdomen.  Bowel sounds present. RECTAL/BREASTS/GENITALIA:  Not done, not pertinent to present illness. EXTREMITIES:  Right knee, range of motion 5 to 125 with moderate crepitus  noted.  Left knee range of motion 10 to 120, marked crepitus noted, had a severe valgus malalignment deformity.  IMPRESSION:  Osteoarthritis left knee greater than right knee.  PLAN:  The patient admitted to [facility] to undergo a left total knee replacement arthroplasty.  Surgery will be performed by Dr. Ollen Walsh.  His cardiologist is Dr. Verdis Walsh.  He will be notified of the room number and admission, be consulted if needed for any medical assistance with the patient throughout the hospital course. The patient has been seen preoperatively by Dr. Katrinka Walsh and felt to be cleared from a cardiovascular standpoint.  He does have a history of systolic heart failure and will be at a risk for acute pulmonary congestion and volume overloaded, so we will pay close attention to his postoperative I's and O's.     Ryan Walsh, P.A.C.   ______________________________ Ryan Walsh, M.D.    ALP/MEDQ  D:  02/02/2011  T:  02/03/2011  Job:  [id number]  cc:   Ryan Walsh, M.D. Fax: [phone]  Electronically Signed by Ryan Walsh P.A.C. on 02/04/2011 10:53:23 AM Electronically Signed by Ryan Walsh M.D. on 02/10/2011 10:09:04 AM

## 2011-03-03 ENCOUNTER — Ambulatory Visit: Payer: 59 | Attending: Orthopedic Surgery | Admitting: Physical Therapy

## 2011-03-03 DIAGNOSIS — IMO0001 Reserved for inherently not codable concepts without codable children: Secondary | ICD-10-CM | POA: Insufficient documentation

## 2011-03-03 DIAGNOSIS — M25669 Stiffness of unspecified knee, not elsewhere classified: Secondary | ICD-10-CM | POA: Insufficient documentation

## 2011-03-03 DIAGNOSIS — M6281 Muscle weakness (generalized): Secondary | ICD-10-CM | POA: Insufficient documentation

## 2011-03-03 DIAGNOSIS — R262 Difficulty in walking, not elsewhere classified: Secondary | ICD-10-CM | POA: Insufficient documentation

## 2011-03-03 DIAGNOSIS — M25569 Pain in unspecified knee: Secondary | ICD-10-CM | POA: Insufficient documentation

## 2011-03-03 NOTE — Discharge Summary (Signed)
NAMEKENRIC, GINGER NO.:  [id number]  MEDICAL RECORD NO.:  [id number]  LOCATION:  1619                         FACILITY:  [facility]  PHYSICIAN:  Ollen Gross, M.D.    DATE OF BIRTH:  [birth year]  DATE OF ADMISSION:  02/03/2011 DATE OF DISCHARGE:  02/08/2011                              DISCHARGE SUMMARY   ADMITTING DIAGNOSES: 1. Osteoarthritis, left knee greater than right knee. 2. Coronary arterial disease. 3. History of myocardial infarction in 2004.  She had an anterior     myocardial infarction. 4. Hypertension. 5. Non-insulin-dependent diabetes mellitus. 6. Hyperlipidemia. 7. History of systolic heart failure with ejection fracture of     approximately 40% per echocardiogram on December 2011. 8. Obesity.  DISCHARGE DIAGNOSES: 1. Osteoarthritis, left knee; status post left total knee replacement     and arthroplasty. 2. Mild postoperative acute blood loss anemia. 3. Postoperative hyponatremia, improved. 4. Postop hypokalemia, improved. 5. Coronary arterial disease. 6. History of myocardial infarction in 2004.  She had an anterior     myocardial infarction. 7. Hypertension. 8. Non-insulin-dependent diabetes mellitus. 9. Hyperlipidemia. 10.History of systolic heart failure with ejection fracture of     approximately 40% per echocardiogram on December 2011. 11.Obesity.  PROCEDURE:  February 03, 2011, left total knee.  SURGEON:  Ollen Gross, M.D.  ASSISTANT:  Alexzandrew L. Perkins, P.A.C.  CONSULTS:  None.  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  He failed nonoperative management, now presents for total knee arthroplasty.  LABORATORY DATA:  CBC on admission not scanned in the chart, but postop hemoglobin down to 10.6 and 9.6.  Last H and H 9.3  and 28.8.  The Chem panel on admission not scanned in the chart, but serial BMETs were followed.  Sodium dropped from 135 to 133 back up to 135.  Potassium dropped from 3.5 down to 3.1 back to 3.8.  Electrolytes remained within normal limits.  Urinalysis taken on February 07, 2011, showed some small hemoglobin, otherwise negative.  We took a chest x-ray on February 07, 2011; lungs clear, no pleural effusion or pneumothorax.  No evidence of acute cardiopulmonary disease.  HOSPITAL COURSE:  The patient admitted to [facility], taken to OR, underwent above-stated procedure without complication.  The patient tolerated the procedure well, later transferred to recovery room on orthopedic floor, started on p.o. and IV analgesic for pain control following surgery.  Doing fairly well on the morning of day 1.  He was to wear with any mask just to keep his oxygen sats up, questionable mouth breather so we kept that on at night.  He was weaned over to p.o. meds.  He was started back on his home medications.  We watched his I's and O's very closely because his history of systolic heart failure with ejection fracture 40%.  His blood pressure meds were renewed except the ACE inhibitor.  We held that and just to insure he had good diuresis. We held his oral hydrochlorothiazide on day 1 and gave him a dose of IV  Lasix.  His Coreg was reinitiated.  We held his metformin initially, postoperatively just to ensure good p.o. intake.  Once that was ensured and established with good renal function, the metformin was renewed postoperatively.  Started to getting up in bed on day 1.  By day 2, he was doing a little bit better.  Pain was under good control.  Continued to work with his therapy, but he did have a little bit of temperature on day 1.  We encouraged incentive spirometers.  He was starting to get up and progressing with therapy.  Sodium and potassium were both low, felt to be a dilutional  component.  I will put him on little bit of oral potassium supplements and we resumed his HCTZ for continued diuresis of his fluids.  Starting getting up more with therapy and by day 3, he had spiked a temperature and we did check a chest x-ray to make sure nothing was going on.  He did have a clear chest x-ray with no acute cardiopulmonary disease process.  Noted that due to his temperature, his discharge was held over the weekend.  He was stayed and receive therapy on Saturday and Sunday.  Incision continued to heal well.  He progressed, his temperature came back down, felt to be more of an atelectasis type condition; and by postop day #5, on February 08, 2011, he was doing better.  He was afebrile.  Incision was continued to heal well.  He met his goals with therapy and was discharged home that day.  DISCHARGE/PLAN: 1. He was discharged home on February 08, 2011. 2. Discharge diagnoses, please see above. 3. Discharge meds:  Nu-Iron, Robaxin, OxyIR, and Xarelto.  Continue     Altace, Coreg, hydrochlorothiazide, Lipitor, and metformin.  DIET:  Heart-healthy diet.  ACTIVITY:  Weightbearing as tolerated.  Total knee protocol.  FOLLOWUP:  In 2 weeks.  DISPOSITION:  Home.  CONDITION ON DISCHARGE:  Improved.     [name], P.A.C.   ______________________________ Ollen Gross, M.D.    ALP/MEDQ  D:  02/18/2011  T:  02/18/2011  Job:  [id number]  cc:   Lyn Records, M.D. Fax: [phone]  Electronically Signed by Patrica Duel P.A.C. on 02/22/2011 04:30:13 PM Electronically Signed by Ollen Gross M.D. on 03/03/2011 11:16:13 AM

## 2011-03-04 ENCOUNTER — Ambulatory Visit: Payer: 59 | Admitting: Physical Therapy

## 2011-03-09 ENCOUNTER — Ambulatory Visit: Payer: 59 | Admitting: Physical Therapy

## 2011-03-11 ENCOUNTER — Ambulatory Visit: Payer: 59 | Admitting: Physical Therapy

## 2011-03-12 ENCOUNTER — Ambulatory Visit: Payer: 59 | Admitting: Physical Therapy

## 2011-03-16 ENCOUNTER — Ambulatory Visit: Payer: 59 | Admitting: Physical Therapy

## 2011-03-18 ENCOUNTER — Ambulatory Visit: Payer: 59 | Admitting: Physical Therapy

## 2011-03-19 ENCOUNTER — Ambulatory Visit: Payer: 59 | Admitting: Physical Therapy

## 2011-03-22 ENCOUNTER — Ambulatory Visit: Payer: 59 | Admitting: Physical Therapy

## 2011-03-23 ENCOUNTER — Ambulatory Visit: Payer: 59 | Admitting: Physical Therapy

## 2011-03-24 ENCOUNTER — Ambulatory Visit: Payer: 59

## 2011-03-25 ENCOUNTER — Encounter: Payer: 59 | Admitting: Physical Therapy

## 2011-03-26 ENCOUNTER — Encounter: Payer: 59 | Admitting: Physical Therapy

## 2011-03-29 ENCOUNTER — Ambulatory Visit: Payer: 59 | Attending: Orthopedic Surgery | Admitting: Physical Therapy

## 2011-03-29 DIAGNOSIS — R262 Difficulty in walking, not elsewhere classified: Secondary | ICD-10-CM | POA: Insufficient documentation

## 2011-03-29 DIAGNOSIS — M6281 Muscle weakness (generalized): Secondary | ICD-10-CM | POA: Insufficient documentation

## 2011-03-29 DIAGNOSIS — M25669 Stiffness of unspecified knee, not elsewhere classified: Secondary | ICD-10-CM | POA: Insufficient documentation

## 2011-03-29 DIAGNOSIS — IMO0001 Reserved for inherently not codable concepts without codable children: Secondary | ICD-10-CM | POA: Insufficient documentation

## 2011-03-29 DIAGNOSIS — M25569 Pain in unspecified knee: Secondary | ICD-10-CM | POA: Insufficient documentation

## 2011-03-30 ENCOUNTER — Encounter: Payer: 59 | Admitting: Physical Therapy

## 2011-03-30 ENCOUNTER — Ambulatory Visit: Payer: 59 | Admitting: Physical Therapy

## 2011-04-01 ENCOUNTER — Encounter: Payer: 59 | Admitting: Physical Therapy

## 2011-04-01 ENCOUNTER — Ambulatory Visit: Payer: 59 | Admitting: Physical Therapy

## 2011-04-02 ENCOUNTER — Encounter: Payer: 59 | Admitting: Physical Therapy

## 2011-04-05 ENCOUNTER — Ambulatory Visit: Payer: 59 | Admitting: Physical Therapy

## 2011-04-07 ENCOUNTER — Ambulatory Visit: Payer: 59 | Admitting: Physical Therapy

## 2011-04-08 ENCOUNTER — Ambulatory Visit: Payer: 59 | Admitting: Physical Therapy

## 2011-04-12 ENCOUNTER — Ambulatory Visit: Payer: 59 | Admitting: Physical Therapy

## 2011-04-14 ENCOUNTER — Ambulatory Visit: Payer: 59 | Admitting: Physical Therapy

## 2011-04-19 ENCOUNTER — Ambulatory Visit: Payer: 59 | Admitting: Physical Therapy

## 2011-04-20 ENCOUNTER — Ambulatory Visit: Payer: 59 | Admitting: Physical Therapy

## 2011-04-22 ENCOUNTER — Encounter: Payer: 59 | Admitting: Physical Therapy

## 2011-04-26 ENCOUNTER — Ambulatory Visit: Payer: 59 | Attending: Orthopedic Surgery | Admitting: Physical Therapy

## 2011-04-26 DIAGNOSIS — IMO0001 Reserved for inherently not codable concepts without codable children: Secondary | ICD-10-CM | POA: Insufficient documentation

## 2011-04-26 DIAGNOSIS — R262 Difficulty in walking, not elsewhere classified: Secondary | ICD-10-CM | POA: Insufficient documentation

## 2011-04-26 DIAGNOSIS — M6281 Muscle weakness (generalized): Secondary | ICD-10-CM | POA: Insufficient documentation

## 2011-04-26 DIAGNOSIS — M25669 Stiffness of unspecified knee, not elsewhere classified: Secondary | ICD-10-CM | POA: Insufficient documentation

## 2011-04-26 DIAGNOSIS — M25569 Pain in unspecified knee: Secondary | ICD-10-CM | POA: Insufficient documentation

## 2011-04-27 ENCOUNTER — Ambulatory Visit: Payer: 59 | Admitting: Physical Therapy

## 2011-04-29 ENCOUNTER — Encounter: Payer: 59 | Admitting: Physical Therapy

## 2011-05-03 ENCOUNTER — Encounter: Payer: 59 | Admitting: Physical Therapy

## 2011-05-04 ENCOUNTER — Encounter: Payer: 59 | Admitting: Physical Therapy

## 2011-05-06 ENCOUNTER — Encounter: Payer: 59 | Admitting: Physical Therapy

## 2012-01-07 ENCOUNTER — Encounter (HOSPITAL_COMMUNITY): Payer: Self-pay

## 2012-01-07 ENCOUNTER — Emergency Department (HOSPITAL_COMMUNITY): Payer: PRIVATE HEALTH INSURANCE

## 2012-01-07 ENCOUNTER — Emergency Department (HOSPITAL_COMMUNITY)
Admission: EM | Admit: 2012-01-07 | Discharge: 2012-01-07 | Disposition: A | Discharge status: Home / Self Care | Payer: PRIVATE HEALTH INSURANCE | Attending: Emergency Medicine | Admitting: Emergency Medicine

## 2012-01-07 DIAGNOSIS — Y9210 Unspecified residential institution as the place of occurrence of the external cause: Secondary | ICD-10-CM | POA: Insufficient documentation

## 2012-01-07 DIAGNOSIS — I2510 Atherosclerotic heart disease of native coronary artery without angina pectoris: Secondary | ICD-10-CM | POA: Insufficient documentation

## 2012-01-07 DIAGNOSIS — I10 Essential (primary) hypertension: Secondary | ICD-10-CM | POA: Insufficient documentation

## 2012-01-07 DIAGNOSIS — W010XXA Fall on same level from slipping, tripping and stumbling without subsequent striking against object, initial encounter: Secondary | ICD-10-CM | POA: Insufficient documentation

## 2012-01-07 DIAGNOSIS — S0990XA Unspecified injury of head, initial encounter: Secondary | ICD-10-CM | POA: Insufficient documentation

## 2012-01-07 DIAGNOSIS — E119 Type 2 diabetes mellitus without complications: Secondary | ICD-10-CM | POA: Insufficient documentation

## 2012-01-07 DIAGNOSIS — Z79899 Other long term (current) drug therapy: Secondary | ICD-10-CM | POA: Insufficient documentation

## 2012-01-07 NOTE — ED Provider Notes (Signed)
History   This chart was scribed for Ryan B. Bernette Mayers, MD by Melba Coon. The patient was seen in room TR07C/TR07C and the patient's care was started at 5:02PM.    CSN: [id number]  Arrival date & time 01/07/12  1641   First MD Initiated Contact with Patient 01/07/12 1659      Chief Complaint  Patient presents with  . Fall    (Consider location/radiation/quality/duration/timing/severity/associated sxs/prior treatment) HPI Ryan Walsh is a 61 y.o. male who presents to the Emergency Department complaining of constant, moderate to severe headache with head injury following a fall today;  no LOC . 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 No blurred vision. No fever, neck pain, sore throat, rash, back pain, CP, SOB, abd pain, n/v/d, dysuria, or extremity pain, edema, weakness, numbness, or tingling. Pt takes Plavix. No known allergies. No other pertinent medical symptoms.   Past Medical History  Diagnosis Date  . Coronary artery disease   . Diabetes mellitus   . Hypertension     Past Surgical History  Procedure Date  . Coronary stent placement     History reviewed. No pertinent family history.  History  Substance Use Topics  . Smoking status: Never Smoker   . Smokeless tobacco: Not on file  . Alcohol Use: Yes     occasionally      Review of Systems 10 Systems reviewed and all are negative for acute change except as noted in the HPI.   Allergies  Review of patient's allergies indicates no known allergies.  Home Medications   Current Outpatient Rx  Name Route Sig Dispense Refill  . AMLODIPINE BESYLATE 5 MG PO TABS Oral Take 5 mg by mouth daily.    . ATORVASTATIN CALCIUM 40 MG PO TABS Oral Take 40 mg by mouth daily.    Marland Kitchen CARVEDILOL 25 MG PO TABS Oral Take 25 mg by mouth 2 (two) times daily with a meal.    . CLOPIDOGREL BISULFATE 75 MG PO TABS Oral  Take 75 mg by mouth daily.    Marland Kitchen EXENATIDE 2 MG [place] SUSR Subcutaneous Inject 2 mg into the skin once a week.    Marland Kitchen HYDROCHLOROTHIAZIDE 25 MG PO TABS Oral Take 25 mg by mouth daily.    Marland Kitchen METFORMIN HCL 1000 MG PO TABS Oral Take 1,000 mg by mouth 2 (two) times daily with a meal.    . RAMIPRIL 10 MG PO CAPS Oral Take 10 mg by mouth daily.      BP 140/93  Pulse 85  Temp 98.6 F (37 C) (Oral)  Resp 18  SpO2 97%  Physical Exam  Nursing note and vitals reviewed. Constitutional: He is oriented to person, place, and time. He appears well-developed and well-nourished.  HENT:  Head: Normocephalic.       Hematoma to posterior scalp  Eyes: EOM are normal. Pupils are equal, round, and reactive to light.  Neck: Normal range of motion. Neck supple.  Cardiovascular: Normal rate, normal heart sounds and intact distal pulses.   Pulmonary/Chest: Effort normal and breath sounds normal.  Abdominal: Bowel sounds are normal. He exhibits no distension. There is no tenderness.  Musculoskeletal: Normal range of motion. He exhibits no edema and no tenderness.  Neurological: He is alert and oriented to person, place, and time. He has normal strength. He displays normal reflexes. No  cranial nerve deficit or sensory deficit. He exhibits normal muscle tone. Coordination abnormal.  Skin: Skin is warm and dry. No rash noted.  Psychiatric: He has a normal mood and affect.    ED Course  Procedures (including critical care time)  DIAGNOSTIC STUDIES: Oxygen Saturation is 97% on room air, normal by my interpretation.    COORDINATION OF CARE:  5:05PM - head CT will be ordered for the pt. 6:07Pm - imaging results reviewed; findings are negative; pt advised about warning signs that might indicate a need to return to the ED; pt ready for d/c.   Labs Reviewed - No data to display Ct Head Wo Contrast  01/07/2012  *RADIOLOGY REPORT*  Clinical Data: Fall with a blow to the back of the head.  CT HEAD WITHOUT CONTRAST   Technique:  Contiguous axial images were obtained from the base of the skull through the vertex without contrast.  Comparison: None.  Findings: The brain appears normal without evidence of infarction, hemorrhage, mass lesion, mass effect, midline shift or abnormal extra-axial fluid collection.  No hydrocephalus or pneumocephalus. The calvarium is intact.  Imaged paranasal sinuses and mastoid air cells are clear.  IMPRESSION: Negative study.  Original Report Authenticated By: Bernadene Bell. D'ALESSIO, M.D.     1. Head injury       MDM  Head injury without concussion on Plavix with neg head CT.Pt given head injury instructions and advised to return for any concerning symptoms.  I personally performed the services described in the documentation, which were scribed in my presence. The recorded information has been reviewed and considered.          Ryan B. Bernette Mayers, MD 01/07/12 Barry Brunner

## 2012-01-07 NOTE — ED Notes (Signed)
Pt slipped in OR and struck head on floor, pt is on plavix, denies loc.

## 2012-02-16 ENCOUNTER — Ambulatory Visit (HOSPITAL_COMMUNITY): Admission: RE | Admit: 2012-02-16 | Payer: 59 | Source: Ambulatory Visit

## 2012-03-15 ENCOUNTER — Ambulatory Visit (HOSPITAL_COMMUNITY)
Admission: RE | Admit: 2012-03-15 | Discharge: 2012-03-15 | Disposition: A | Discharge status: Home / Self Care | Payer: 59 | Source: Ambulatory Visit | Attending: Interventional Cardiology | Admitting: Interventional Cardiology

## 2012-03-15 DIAGNOSIS — I509 Heart failure, unspecified: Secondary | ICD-10-CM | POA: Insufficient documentation

## 2012-03-15 NOTE — Progress Notes (Signed)
  Echocardiogram 2D Echocardiogram has been performed.  Ryan Walsh 03/15/2012, 10:52 AM

## 2013-03-20 ENCOUNTER — Encounter: Payer: Self-pay | Admitting: Interventional Cardiology

## 2013-03-21 ENCOUNTER — Encounter: Payer: Self-pay | Admitting: Interventional Cardiology

## 2013-03-21 ENCOUNTER — Ambulatory Visit (INDEPENDENT_AMBULATORY_CARE_PROVIDER_SITE_OTHER): Payer: 59 | Admitting: Interventional Cardiology

## 2013-03-21 VITALS — BP 137/88 | HR 100 | Ht 67.0 in | Wt 238.0 lb

## 2013-03-21 DIAGNOSIS — I2510 Atherosclerotic heart disease of native coronary artery without angina pectoris: Secondary | ICD-10-CM

## 2013-03-21 DIAGNOSIS — I5042 Chronic combined systolic (congestive) and diastolic (congestive) heart failure: Secondary | ICD-10-CM

## 2013-03-21 DIAGNOSIS — E785 Hyperlipidemia, unspecified: Secondary | ICD-10-CM

## 2013-03-21 DIAGNOSIS — I10 Essential (primary) hypertension: Secondary | ICD-10-CM | POA: Insufficient documentation

## 2013-03-21 DIAGNOSIS — E1165 Type 2 diabetes mellitus with hyperglycemia: Secondary | ICD-10-CM | POA: Insufficient documentation

## 2013-03-21 DIAGNOSIS — E669 Obesity, unspecified: Secondary | ICD-10-CM

## 2013-03-21 DIAGNOSIS — I252 Old myocardial infarction: Secondary | ICD-10-CM

## 2013-03-21 DIAGNOSIS — I509 Heart failure, unspecified: Secondary | ICD-10-CM

## 2013-03-21 NOTE — Progress Notes (Signed)
Patient ID: Ryan Walsh, male   DOB: 01-13-1951, 62 y.o.   MRN: [id number]    [address], [address] [place], [place]  [address] Phone: [phone] Fax:  [phone]  Date:  03/21/2013   ID:  Ryan Walsh, DOB 1951-02-20, MRN [id number]  PCP:  Lesleigh Noe, MD   ASSESSMENT:  1. Coronary atherosclerosis with prior LAD angioplasty during MI, stable without angina 2. Old anterior myocardial infarction, LVEF 40-45% by echo January 2013 3. Combined systolic and diastolic heart failure, chronic and stable 4. Hypertension, controlled 5. Diabetes mellitus, followed by primary physician 6. Moderate obesity. I have concerns that he may have sleep apnea.  PLAN:  1. Continue aspirin daily 2. I've encouraged weight loss through aerobic activity and caloric reduction 3. Decrease salt and saturated fat in diet 4. No testing is needed at this time. 5. I have concerns that he may have sleep apnea and probably needs to have a sleep study.   SUBJECTIVE: Ryan Walsh is a 62 y.o. male is here today for followup. He had a myocardial infarction involving his anterior wall 10 years ago. Echocardiography one year ago demonstrated EF of 40-45%. There are no cardiopulmonary symptoms. He has gained weight. He has been sedentary. He has difficulty sleeping. His wife tells him that he snores when he is extremely tired. He awakens from sleep feeling fatigued. He has not needed nitroglycerin. He denies orthopnea, PND, peripheral edema, and dyspnea on exertion. He is not exercising.   Wt Readings from Last 3 Encounters:  03/21/13 238 lb (107.956 kg)     Past Medical History  Diagnosis Date  . Diabetes mellitus   . Pure hypercholesterolemia   . Coronary atherosclerosis of native coronary artery   . Essential hypertension, benign   . MI (myocardial infarction) 2004    Anterior wall MI treated with bare-metal stent in 2004. No ischemia by nuclear 7/12  . Chronic combined systolic and  diastolic CHF (congestive heart failure)     LVEF 40-45% by echo 0'2725  . Obesity   . Osteoarthritis     Both hips and knees    Current Outpatient Prescriptions  Medication Sig Dispense Refill  . amLODipine (NORVASC) 5 MG tablet Take 5 mg by mouth daily.      . carvedilol (COREG) 25 MG tablet Take 25 mg by mouth 2 (two) times daily with a meal.      . clopidogrel (PLAVIX) 75 MG tablet Take 75 mg by mouth daily.      . metFORMIN (GLUCOPHAGE) 1000 MG tablet Take 1,000 mg by mouth 2 (two) times daily with a meal.      . ramipril (ALTACE) 10 MG capsule Take 10 mg by mouth daily.      Marland Kitchen triamterene-hydrochlorothiazide (DYAZIDE) 37.5-25 MG per capsule Take 1 capsule by mouth every morning.       No current facility-administered medications for this visit.    Allergies:   No Active Allergies  Social History:  The patient  reports that he has never smoked. He does not have any smokeless tobacco history on file. He reports that he drinks alcohol. He reports that he does not use illicit drugs.   ROS:  Please see the history of present illness.   Weight gain, insomnia/difficulty sleeping, acid reflux, sedentary lifestyle   All other systems reviewed and negative.   OBJECTIVE: VS:  BP 137/88  Pulse 100  Ht 5\' 7"  (1.702 m)  Wt 238 lb (107.956  kg)  BMI 37.27 kg/m2 Well nourished, well developed, in no acute distress, obese having gained weight since the last office visit HEENT: normal Neck: JVD flat with the patient lying at 30. Carotid bruit absent  Cardiac:  normal S1, S2; RRR; no murmur. An S4 gallop is audible Lungs:  clear to auscultation bilaterally, no wheezing, rhonchi or rales Abd: soft, nontender, no hepatomegaly Ext: Edema trace ankle. Pulses 2+ and symmetric bilateral upper and lower Skin: warm and dry Neuro:  CNs 2-12 intact, no focal abnormalities noted  EKG:  Normal sinus rhythm with left atrial abnormality, left axis deviation, and poor R-wave progression V1 through V4  consistent with prior anterior MI       Signed, Darci Needle III, MD 03/21/2013 4:53 PM

## 2013-03-21 NOTE — Patient Instructions (Signed)
Your physician recommends that you continue on your current medications as directed. Please refer to the Current Medication list given to you today.  Your physician wants you to follow-up in: 1 year. You will receive a reminder letter in the mail two months in advance. If you don't receive a letter, please call our office to schedule the follow-up appointment.  

## 2013-03-29 ENCOUNTER — Other Ambulatory Visit: Payer: Self-pay | Admitting: Interventional Cardiology

## 2013-04-27 ENCOUNTER — Other Ambulatory Visit: Payer: Self-pay | Admitting: Interventional Cardiology

## 2013-05-09 ENCOUNTER — Ambulatory Visit (INDEPENDENT_AMBULATORY_CARE_PROVIDER_SITE_OTHER): Payer: 59 | Admitting: *Deleted

## 2013-05-09 DIAGNOSIS — Z23 Encounter for immunization: Secondary | ICD-10-CM

## 2013-08-07 ENCOUNTER — Other Ambulatory Visit: Payer: Self-pay | Admitting: Interventional Cardiology

## 2013-08-07 NOTE — Telephone Encounter (Signed)
Is the patient still supposed to be on this? Please advise. Thanks, MI

## 2013-10-18 ENCOUNTER — Other Ambulatory Visit: Payer: Self-pay | Admitting: Obstetrics & Gynecology

## 2013-11-02 ENCOUNTER — Other Ambulatory Visit: Payer: Self-pay | Admitting: Orthopedic Surgery

## 2013-11-09 ENCOUNTER — Other Ambulatory Visit: Payer: Self-pay | Admitting: Interventional Cardiology

## 2013-11-15 NOTE — Progress Notes (Signed)
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

## 2013-11-21 ENCOUNTER — Encounter (HOSPITAL_BASED_OUTPATIENT_CLINIC_OR_DEPARTMENT_OTHER)
Admission: RE | Disposition: A | Discharge status: Home / Self Care | Payer: Self-pay | Source: Ambulatory Visit | Attending: Orthopedic Surgery

## 2013-11-21 ENCOUNTER — Ambulatory Visit (HOSPITAL_BASED_OUTPATIENT_CLINIC_OR_DEPARTMENT_OTHER)
Admission: RE | Admit: 2013-11-21 | Discharge: 2013-11-21 | Disposition: A | Discharge status: Home / Self Care | Payer: 59 | Source: Ambulatory Visit | Attending: Orthopedic Surgery | Admitting: Orthopedic Surgery

## 2013-11-21 ENCOUNTER — Encounter (HOSPITAL_BASED_OUTPATIENT_CLINIC_OR_DEPARTMENT_OTHER): Payer: Self-pay | Admitting: *Deleted

## 2013-11-21 DIAGNOSIS — M1710 Unilateral primary osteoarthritis, unspecified knee: Secondary | ICD-10-CM | POA: Insufficient documentation

## 2013-11-21 DIAGNOSIS — I509 Heart failure, unspecified: Secondary | ICD-10-CM | POA: Insufficient documentation

## 2013-11-21 DIAGNOSIS — I5042 Chronic combined systolic (congestive) and diastolic (congestive) heart failure: Secondary | ICD-10-CM | POA: Insufficient documentation

## 2013-11-21 DIAGNOSIS — E7800 Pure hypercholesterolemia, unspecified: Secondary | ICD-10-CM | POA: Insufficient documentation

## 2013-11-21 DIAGNOSIS — Z9861 Coronary angioplasty status: Secondary | ICD-10-CM | POA: Insufficient documentation

## 2013-11-21 DIAGNOSIS — G5600 Carpal tunnel syndrome, unspecified upper limb: Secondary | ICD-10-CM | POA: Insufficient documentation

## 2013-11-21 DIAGNOSIS — Z96649 Presence of unspecified artificial hip joint: Secondary | ICD-10-CM | POA: Insufficient documentation

## 2013-11-21 DIAGNOSIS — I10 Essential (primary) hypertension: Secondary | ICD-10-CM | POA: Insufficient documentation

## 2013-11-21 DIAGNOSIS — E669 Obesity, unspecified: Secondary | ICD-10-CM | POA: Insufficient documentation

## 2013-11-21 DIAGNOSIS — G5601 Carpal tunnel syndrome, right upper limb: Secondary | ICD-10-CM

## 2013-11-21 DIAGNOSIS — I252 Old myocardial infarction: Secondary | ICD-10-CM | POA: Insufficient documentation

## 2013-11-21 DIAGNOSIS — E119 Type 2 diabetes mellitus without complications: Secondary | ICD-10-CM | POA: Insufficient documentation

## 2013-11-21 DIAGNOSIS — I2510 Atherosclerotic heart disease of native coronary artery without angina pectoris: Secondary | ICD-10-CM | POA: Insufficient documentation

## 2013-11-21 DIAGNOSIS — Z7902 Long term (current) use of antithrombotics/antiplatelets: Secondary | ICD-10-CM | POA: Insufficient documentation

## 2013-11-21 HISTORY — PX: CARPAL TUNNEL RELEASE: SHX101

## 2013-11-21 SURGERY — MINOR CARPAL TUNNEL
Anesthesia: LOCAL | Site: Hand | Laterality: Right

## 2013-11-21 MED ORDER — SODIUM BICARBONATE 4 % IV SOLN
INTRAVENOUS | Status: AC
  Filled 2013-11-21: qty 5

## 2013-11-21 MED ORDER — OXYCODONE-ACETAMINOPHEN 5-325 MG PO TABS: 1.0000 | ORAL_TABLET | ORAL | Status: DC | PRN

## 2013-11-21 MED ORDER — LIDOCAINE HCL 2 % IJ SOLN
INTRAMUSCULAR | Status: AC
  Filled 2013-11-21: qty 20

## 2013-11-21 SURGICAL SUPPLY — 40 items
APL SKNCLS STERI-STRIP NONHPOA (GAUZE/BANDAGES/DRESSINGS) ×1
BANDAGE ELASTIC 4 VELCRO ST LF (GAUZE/BANDAGES/DRESSINGS) ×2 IMPLANT
BENZOIN TINCTURE PRP APPL 2/3 (GAUZE/BANDAGES/DRESSINGS) ×2 IMPLANT
BLADE SURG 15 STRL LF DISP TIS (BLADE) ×1 IMPLANT
BLADE SURG 15 STRL SS (BLADE) ×2
BNDG CMPR 9X4 STRL LF SNTH (GAUZE/BANDAGES/DRESSINGS)
BNDG ESMARK 4X9 LF (GAUZE/BANDAGES/DRESSINGS) IMPLANT
BNDG GAUZE ELAST 4 BULKY (GAUZE/BANDAGES/DRESSINGS) ×2 IMPLANT
CORDS BIPOLAR (ELECTRODE) IMPLANT
COVER TABLE BACK 60X90 (DRAPES) ×2 IMPLANT
DRAPE EXTREMITY T 121X128X90 (DRAPE) ×2 IMPLANT
DRAPE SURG 17X23 STRL (DRAPES) ×1 IMPLANT
DURAPREP 26ML APPLICATOR (WOUND CARE) ×2 IMPLANT
GAUZE SPONGE 4X4 12PLY STRL (GAUZE/BANDAGES/DRESSINGS) ×2 IMPLANT
GLOVE BIOGEL PI IND STRL 7.0 (GLOVE) IMPLANT
GLOVE BIOGEL PI INDICATOR 7.0 (GLOVE) ×2
GLOVE ECLIPSE 6.5 STRL STRAW (GLOVE) ×2 IMPLANT
GLOVE SURG SYN 8.0 (GLOVE) ×4 IMPLANT
GLOVE SURG SYN 8.0 PF PI (GLOVE) ×2 IMPLANT
GOWN STRL REUS W/ TWL LRG LVL3 (GOWN DISPOSABLE) ×1 IMPLANT
GOWN STRL REUS W/TWL LRG LVL3 (GOWN DISPOSABLE) ×4
GOWN STRL REUS W/TWL XL LVL3 (GOWN DISPOSABLE) ×2 IMPLANT
NDL HYPO 25X1 1.5 SAFETY (NEEDLE) IMPLANT
NEEDLE HYPO 25X1 1.5 SAFETY (NEEDLE) IMPLANT
NS IRRIG 1000ML POUR BTL (IV SOLUTION) ×2 IMPLANT
PACK BASIN DAY SURGERY FS (CUSTOM PROCEDURE TRAY) ×2 IMPLANT
PAD CAST 3X4 CTTN HI CHSV (CAST SUPPLIES) ×1 IMPLANT
PADDING CAST ABS 4INX4YD NS (CAST SUPPLIES) ×1
PADDING CAST ABS COTTON 4X4 ST (CAST SUPPLIES) ×1 IMPLANT
PADDING CAST COTTON 3X4 STRL (CAST SUPPLIES) ×2
SHEET MEDIUM DRAPE 40X70 STRL (DRAPES) ×2 IMPLANT
STOCKINETTE 4X48 STRL (DRAPES) ×2 IMPLANT
STRIP CLOSURE SKIN 1/2X4 (GAUZE/BANDAGES/DRESSINGS) ×2 IMPLANT
SUT PROLENE 3 0 PS 2 (SUTURE) ×2 IMPLANT
SUT VICRYL RAPIDE 4-0 (SUTURE) IMPLANT
SUT VICRYL RAPIDE 4/0 PS 2 (SUTURE) IMPLANT
SYR BULB 3OZ (MISCELLANEOUS) ×2 IMPLANT
SYRINGE 12CC LL (MISCELLANEOUS) IMPLANT
TOWEL OR 17X24 6PK STRL BLUE (TOWEL DISPOSABLE) ×2 IMPLANT
UNDERPAD 30X30 INCONTINENT (UNDERPADS AND DIAPERS) ×2 IMPLANT

## 2013-11-21 NOTE — H&P (Signed)
Ryan Walsh is an 63 y.o. male.   Chief Complaint:right carpal tunnel  Past Medical History  Diagnosis Date  . Diabetes mellitus   . Pure hypercholesterolemia   . Coronary atherosclerosis of native coronary artery   . Essential hypertension, benign   . MI (myocardial infarction) 2004    Anterior wall MI treated with bare-metal stent in 2004. No ischemia by nuclear 7/12  . Chronic combined systolic and diastolic CHF (congestive heart failure)     LVEF 40-45% by echo 9'4174  . Obesity   . Osteoarthritis     Both hips and knees    Past Surgical History  Procedure Laterality Date  . Coronary stent placement  2004    Bare-metal stent for treatment of anterior wall MI   . Total hip arthroplasty Left   . Total hip arthroplasty Right     History reviewed. No pertinent family history. Social History:  reports that he has never smoked. He does not have any smokeless tobacco history on file. He reports that he drinks alcohol. He reports that he does not use illicit drugs.  Allergies: No Active Allergies  Medications Prior to Admission  Medication Sig Dispense Refill  . amLODipine (NORVASC) 5 MG tablet TAKE 1 TABLET BY MOUTH DAILY  90 tablet  PRN  . atorvastatin (LIPITOR) 40 MG tablet TAKE 1 TABLET BY MOUTH ONCE DAILY  90 tablet  1  . carvedilol (COREG) 25 MG tablet TAKE 1 TABLET BY MOUTH TWICE DAILY WITH FOOD  180 tablet  0  . clopidogrel (PLAVIX) 75 MG tablet Take 75 mg by mouth daily.      . metFORMIN (GLUCOPHAGE) 1000 MG tablet Take 1,000 mg by mouth 2 (two) times daily with a meal.      . ramipril (ALTACE) 10 MG capsule Take 10 mg by mouth daily.      Marland Kitchen triamterene-hydrochlorothiazide (DYAZIDE) 37.5-25 MG per capsule TAKE 1 CAPSULE BY MOUTH EVERY MORNING  90 capsule  2    No results found for this or any previous visit (from the past 48 hour(s)). No results found.  Review of Systems  All other systems reviewed and are negative.   Blood pressure 126/85, pulse 98,  temperature 98.2 F (36.8 C), temperature source Oral, resp. rate 20, SpO2 96.00%. Physical Exam  Constitutional: He is oriented to person, place, and time. He appears well-developed and well-nourished.  HENT:  Head: Normocephalic and atraumatic.  Cardiovascular: Normal rate.   Respiratory: Effort normal.  Musculoskeletal:       Right wrist: He exhibits tenderness.  Right carpal tunnel syndrome with positive tinels  Neurological: He is alert and oriented to person, place, and time.  Skin: Skin is warm.  Psychiatric: He has a normal mood and affect. His behavior is normal. Judgment and thought content normal.     Assessment/Plan As above   Plan right CTR under local  Kenyon Eshleman A 11/21/2013, 8:26 AM

## 2013-11-21 NOTE — Op Note (Signed)
See note [phone]

## 2013-11-21 NOTE — Op Note (Signed)
[name], Ryan Walsh NO.:  [id number]  MEDICAL RECORD NO.:  [id number]  LOCATION:                               FACILITY:  [facility]  PHYSICIAN:  Sheral Apley. Cassady Turano, M.D.DATE OF BIRTH:  18-Jan-1951  DATE OF PROCEDURE:  11/21/2013 DATE OF DISCHARGE:  11/21/2013                              OPERATIVE REPORT   PREOPERATIVE DIAGNOSIS:  Chronic right carpal tunnel syndrome.  POSTOPERATIVE DIAGNOSIS:  Chronic right carpal tunnel syndrome.  PROCEDURE:  Right carpal tunnel release.  SURGEON:  Sheral Apley. Burney Gauze, M.D.  ASSISTANT:  None.  ANESTHESIA:  Local; 20 mL of 1% lidocaine with epinephrine 1:100,000; 2 mL of bicarb.  TOURNIQUET:  None.  COMPLICATIONS:  None.  DRAINS:  None.  DESCRIPTION OF PROCEDURE:  A 20 minutes prior to being taken to the operating suite, I injected 20 mL of 1% lidocaine with epinephrine 1:100,000 and 2 mL of bicarb solution into the carpal tunnel and thenar crease area.  The patient after 20 minutes was then taken to the operating suite, prepped and draped in usual sterile fashion.  A 2-cm incision was made in the palmar aspect of right hand in line with long metacarpal, starting Kaplan's cardinal line.  Skin was incised.  Palmar fascia was identified and split.  Distal edge of the transverse carpal ligament was identified and split with a #15 blade.  Median nerve was identified and protected with a Soil scientist.  Remaining aspects of the transverse carpal ligament were then divided under direct vision using curved blunt scissors.  Canal was inspected.  There were no osseous lesion again was present.  It was irrigated and loosely closed with 3-0 Prolene subcuticular stitch.  Steri-Strips, 4x4s, fluffs, and compressive dressing was applied.  The patient tolerated the procedure well and went to the recovery room in stable fashion.     Sheral Apley Burney Gauze, M.D.    MAW/MEDQ  D:  11/21/2013  T:  11/21/2013  Job:  [id number]

## 2014-03-12 ENCOUNTER — Other Ambulatory Visit: Payer: Self-pay | Admitting: Interventional Cardiology

## 2014-03-15 ENCOUNTER — Telehealth: Payer: Self-pay | Admitting: Interventional Cardiology

## 2014-03-15 NOTE — Telephone Encounter (Signed)
New problem    Pt need an appt later November or December. Please call pt with appt.

## 2014-03-19 NOTE — Telephone Encounter (Signed)
pt appt with Dr.Smith scheduled on 12/16 @8am . pt is aware of appt.

## 2014-05-08 ENCOUNTER — Other Ambulatory Visit: Payer: Self-pay | Admitting: *Deleted

## 2014-05-08 ENCOUNTER — Ambulatory Visit (INDEPENDENT_AMBULATORY_CARE_PROVIDER_SITE_OTHER): Payer: 59 | Admitting: Interventional Cardiology

## 2014-05-08 ENCOUNTER — Encounter: Payer: Self-pay | Admitting: Interventional Cardiology

## 2014-05-08 VITALS — BP 126/82 | HR 88 | Ht 67.0 in | Wt 241.4 lb

## 2014-05-08 DIAGNOSIS — B372 Candidiasis of skin and nail: Secondary | ICD-10-CM

## 2014-05-08 DIAGNOSIS — E1165 Type 2 diabetes mellitus with hyperglycemia: Secondary | ICD-10-CM

## 2014-05-08 DIAGNOSIS — IMO0002 Reserved for concepts with insufficient information to code with codable children: Secondary | ICD-10-CM

## 2014-05-08 DIAGNOSIS — I252 Old myocardial infarction: Secondary | ICD-10-CM

## 2014-05-08 DIAGNOSIS — I10 Essential (primary) hypertension: Secondary | ICD-10-CM

## 2014-05-08 DIAGNOSIS — E785 Hyperlipidemia, unspecified: Secondary | ICD-10-CM

## 2014-05-08 DIAGNOSIS — I2510 Atherosclerotic heart disease of native coronary artery without angina pectoris: Secondary | ICD-10-CM | POA: Insufficient documentation

## 2014-05-08 DIAGNOSIS — I5042 Chronic combined systolic (congestive) and diastolic (congestive) heart failure: Secondary | ICD-10-CM

## 2014-05-08 DIAGNOSIS — I25119 Atherosclerotic heart disease of native coronary artery with unspecified angina pectoris: Secondary | ICD-10-CM

## 2014-05-08 DIAGNOSIS — K219 Gastro-esophageal reflux disease without esophagitis: Secondary | ICD-10-CM

## 2014-05-08 DIAGNOSIS — IMO0001 Reserved for inherently not codable concepts without codable children: Secondary | ICD-10-CM

## 2014-05-08 MED ORDER — CICLOPIROX OLAMINE 0.77 % EX CREA: TOPICAL_CREAM | Freq: Two times a day (BID) | CUTANEOUS | Status: DC

## 2014-05-08 MED ORDER — RANITIDINE HCL 150 MG PO TABS: 150.0000 mg | ORAL_TABLET | Freq: Two times a day (BID) | ORAL | Status: DC

## 2014-05-08 MED ORDER — CICLOPIROX 8 % EX SOLN: Freq: Every day | CUTANEOUS | Status: DC

## 2014-05-08 NOTE — Progress Notes (Signed)
Patient ID: Ryan Bombard, MD, male   DOB: 1951-02-07, 63 y.o.   MRN: [id number]    [address] [place], [place]  [address] Phone: [phone] Fax:  [phone]  Date:  05/08/2014    ID:  Ryan Bombard, MD, DOB [birth year], MRN [id number]  PCP:  Sinclair Grooms, MD   ASSESSMENT:  1. Coronary artery disease with prior anterior myocardial infarction and PCI on the LAD, stable without angina 2. Hyperlipidemia followed by primary care, Dr. Brigitte Pulse 3. Hypertension with excellent control 4. Chronic combined systolic and diastolic heart, asymptomatic on medical therapy  5. Morbid obesity  PLAN:  1. Continue Ace, diuretic, and beta blocker therapy 2. Exercises needed to optimally control risk factors 3. Weight loss is discussed. 4. 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 He had right carpal tunnel surgery earlier this year. He denies neurological symptoms either peripheral or central. There is no claudication. Still limited by joint ailments. Did have a successful right knee replacement. Has bilateral hip replacements. He is frustrated by the fact that his weight is continuing to go up. He is not exercising. He denies orthopnea, PND, and peripheral edema.   Wt Readings from Last 3 Encounters:  05/08/14 241 lb 6.4 oz (109.498 kg)  03/21/13 238 lb (107.956 kg)     Past Medical History  Diagnosis Date  . Diabetes mellitus   . Pure hypercholesterolemia   . Coronary atherosclerosis of native coronary artery   . Essential hypertension, benign   . MI (myocardial infarction) 2004    Anterior wall MI treated with bare-metal stent in 2004. No ischemia by nuclear 7/12  . Chronic combined systolic and diastolic CHF (congestive heart failure)     LVEF 40-45% by echo 1'3244  . Obesity   . Osteoarthritis     Both hips and knees    Current Outpatient Prescriptions  Medication Sig Dispense  Refill  . amLODipine (NORVASC) 5 MG tablet TAKE 1 TABLET BY MOUTH DAILY 90 tablet PRN  . atorvastatin (LIPITOR) 40 MG tablet TAKE 1 TABLET BY MOUTH ONCE DAILY 90 tablet 0  . carvedilol (COREG) 25 MG tablet TAKE 1 TABLET BY MOUTH TWICE DAILY WITH FOOD 180 tablet 0  . clopidogrel (PLAVIX) 75 MG tablet Take 75 mg by mouth daily.    . Exenatide ER (BYDUREON) 2 MG PEN Inject into the skin.    . metFORMIN (GLUCOPHAGE) 1000 MG tablet Take 1,000 mg by mouth 2 (two) times daily with a meal.    . oxyCODONE-acetaminophen (ROXICET) 5-325 MG per tablet Take 1 tablet by mouth every 4 (four) hours as needed for severe pain. 30 tablet 0  . ramipril (ALTACE) 10 MG capsule Take 10 mg by mouth daily.    Marland Kitchen triamterene-hydrochlorothiazide (DYAZIDE) 37.5-25 MG per capsule TAKE 1 CAPSULE BY MOUTH EVERY MORNING 90 capsule 2   No current facility-administered medications for this visit.    Allergies:   No Known Allergies  Social History:  The patient  reports that he has never smoked. He does not have any smokeless tobacco history on file. He reports that he drinks alcohol. He reports that he does not use illicit drugs.   ROS:  Please see the history of present illness.   No transient ischemic symptoms. No orthopnea, PND, palpitations, syncope, or hematuria.   All other systems reviewed and negative.   OBJECTIVE: VS:  BP 126/82 mmHg  Pulse 88  Ht 5\' 7"  (1.702 m)  Wt 241 lb 6.4 oz (109.498 kg)  BMI 37.80 kg/m2 Well nourished, well developed, in no acute distress, obese HEENT: normal Neck: JVD flat. Carotid bruit absent  Cardiac:  normal S1, S2; RRR; no murmur Lungs:  clear to auscultation bilaterally, no wheezing, rhonchi or rales Abd: soft, nontender, no hepatomegaly Ext: Edema trace bilateral. Pulses 2+ and symmetric Skin: warm and dry Neuro:  CNs 2-12 intact, no focal abnormalities noted  EKG:  Normal sinus rhythm. Poor R-wave progression compatible with history of prior anterior infarction. Biatrial  abnormality. Left axis deviation.       Signed, Illene Labrador III, MD 05/08/2014 8:23 AM

## 2014-05-08 NOTE — Patient Instructions (Signed)
Your physician recommends that you continue on your current medications as directed. Please refer to the Current Medication list given to you today.  Your physician wants you to follow-up in: 1 year. You will receive a reminder letter in the mail two months in advance. If you don't receive a letter, please call our office to schedule the follow-up appointment.  

## 2014-05-21 ENCOUNTER — Encounter: Payer: Self-pay | Admitting: Obstetrics & Gynecology

## 2014-06-03 ENCOUNTER — Other Ambulatory Visit: Payer: Self-pay | Admitting: Interventional Cardiology

## 2014-07-23 ENCOUNTER — Other Ambulatory Visit: Payer: Self-pay | Admitting: Interventional Cardiology

## 2014-09-18 ENCOUNTER — Other Ambulatory Visit: Payer: Self-pay

## 2014-09-18 MED ORDER — TRIAMTERENE-HCTZ 37.5-25 MG PO CAPS: 1.0000 | ORAL_CAPSULE | Freq: Every morning | ORAL | Status: DC

## 2015-04-21 ENCOUNTER — Other Ambulatory Visit: Payer: Self-pay | Admitting: Interventional Cardiology

## 2015-04-30 ENCOUNTER — Other Ambulatory Visit: Payer: Self-pay | Admitting: *Deleted

## 2015-04-30 ENCOUNTER — Other Ambulatory Visit: Payer: 59

## 2015-04-30 DIAGNOSIS — I10 Essential (primary) hypertension: Secondary | ICD-10-CM

## 2015-04-30 DIAGNOSIS — E119 Type 2 diabetes mellitus without complications: Secondary | ICD-10-CM

## 2015-04-30 LAB — CBC
HEMATOCRIT: 38.9 % — AB (ref 39.0–52.0)
Hemoglobin: 13.4 g/dL (ref 13.0–17.0)
MCH: 27.7 pg (ref 26.0–34.0)
MCHC: 34.4 g/dL (ref 30.0–36.0)
MCV: 80.5 fL (ref 78.0–100.0)
MPV: 10.7 fL (ref 8.6–12.4)
PLATELETS: 142 10*3/uL — AB (ref 150–400)
RBC: 4.83 MIL/uL (ref 4.22–5.81)
RDW: 14 % (ref 11.5–15.5)
WBC: 7 10*3/uL (ref 4.0–10.5)

## 2015-05-07 ENCOUNTER — Encounter: Payer: Self-pay | Admitting: Interventional Cardiology

## 2015-05-14 ENCOUNTER — Telehealth: Payer: Self-pay

## 2015-05-14 NOTE — Telephone Encounter (Signed)
Pt aware that Dr.Smith has reviewed the lab results that were fwd to him. Pt sts that he is due to have a lipid and hepatic labs in a couple of months, he will have a copy of the results fwd to our office for Dr.Smith to review.

## 2015-05-14 NOTE — Telephone Encounter (Signed)
-----   Message from Belva Crome, MD sent at 05/08/2015  5:12 PM EST ----- His electrolytes and kidney function are normal.  Was a lipid panel done.

## 2015-05-14 NOTE — Telephone Encounter (Signed)
Cardiac clearance placed in MR nurse fax box to be faxed to [place] attn: Dr.Mann

## 2015-05-16 ENCOUNTER — Other Ambulatory Visit: Payer: Self-pay | Admitting: Certified Nurse Midwife

## 2015-05-16 DIAGNOSIS — L309 Dermatitis, unspecified: Secondary | ICD-10-CM

## 2015-05-16 MED ORDER — FLUOCINONIDE 0.1 % EX CREA: 1.0000 "application " | TOPICAL_CREAM | Freq: Every day | CUTANEOUS | Status: DC

## 2015-05-23 ENCOUNTER — Other Ambulatory Visit: Payer: Self-pay | Admitting: Certified Nurse Midwife

## 2015-05-23 DIAGNOSIS — R21 Rash and other nonspecific skin eruption: Secondary | ICD-10-CM

## 2015-05-23 DIAGNOSIS — L309 Dermatitis, unspecified: Secondary | ICD-10-CM

## 2015-05-23 MED ORDER — PREDNISONE 10 MG (21) PO TBPK: ORAL_TABLET | ORAL | Status: DC

## 2015-06-13 MED FILL — BYDUREON 2 MG PEN INJECT: 2 | 56 days supply | Qty: 8 | Fill #2

## 2015-06-13 MED FILL — RAMIPRIL 10 MG CAPSULE: 10 | 90 days supply | Qty: 180 | Fill #1

## 2015-06-13 MED FILL — METFORMIN HCL ER 500 MG TAB: 500 | 90 days supply | Qty: 360 | Fill #2

## 2015-06-13 MED FILL — FLUOCINONIDE 0.1% CREAM: 0.1 | 14 days supply | Qty: 120 | Fill #1

## 2015-06-18 ENCOUNTER — Encounter: Payer: Self-pay | Admitting: Interventional Cardiology

## 2015-06-18 ENCOUNTER — Ambulatory Visit (INDEPENDENT_AMBULATORY_CARE_PROVIDER_SITE_OTHER): Payer: 59 | Admitting: Interventional Cardiology

## 2015-06-18 VITALS — BP 134/96 | HR 104 | Ht 66.0 in | Wt 241.4 lb

## 2015-06-18 DIAGNOSIS — E669 Obesity, unspecified: Secondary | ICD-10-CM | POA: Diagnosis not present

## 2015-06-18 DIAGNOSIS — I25119 Atherosclerotic heart disease of native coronary artery with unspecified angina pectoris: Secondary | ICD-10-CM

## 2015-06-18 DIAGNOSIS — I5042 Chronic combined systolic (congestive) and diastolic (congestive) heart failure: Secondary | ICD-10-CM | POA: Diagnosis not present

## 2015-06-18 DIAGNOSIS — I10 Essential (primary) hypertension: Secondary | ICD-10-CM | POA: Diagnosis not present

## 2015-06-18 DIAGNOSIS — E785 Hyperlipidemia, unspecified: Secondary | ICD-10-CM | POA: Diagnosis not present

## 2015-06-18 DIAGNOSIS — IMO0002 Reserved for concepts with insufficient information to code with codable children: Secondary | ICD-10-CM

## 2015-06-18 DIAGNOSIS — E1165 Type 2 diabetes mellitus with hyperglycemia: Secondary | ICD-10-CM

## 2015-06-18 DIAGNOSIS — E1159 Type 2 diabetes mellitus with other circulatory complications: Secondary | ICD-10-CM

## 2015-06-18 NOTE — Patient Instructions (Signed)
Medication Instructions:  Your physician recommends that you continue on your current medications as directed. Please refer to the Current Medication list given to you today.   Labwork: Please forward [place] a copy of your recent Lipid panel  Testing/Procedures: Your physician has requested that you have an echocardiogram. Echocardiography is a painless test that uses sound waves to create images of your heart. It provides your doctor with information about the size and shape of your heart and how well your heart's chambers and valves are working. This procedure takes approximately one hour. There are no restrictions for this procedure.   Follow-Up: Your physician wants you to follow-up in: 1 year with Dr.Smith You will receive a reminder letter in the mail two months in advance. If you don't receive a letter, please call our office to schedule the follow-up appointment.   Any Other Special Instructions Will Be Listed Below (If Applicable). Your physician encouraged you to lose weight for better health.  Your physician discussed the importance of regular exercise and recommended that you start or continue a regular exercise program for good health.  Please limit your sodium intake     If you need a refill on your cardiac medications before your next appointment, please call your pharmacy.

## 2015-06-18 NOTE — Progress Notes (Signed)
Cardiology Office Note   Date:  06/18/2015   ID:  Ryan Bombard, MD, DOB 13-Nov-1950, MRN [id number]  PCP:  Marton Redwood, MD  Cardiologist:  Sinclair Grooms, MD   Chief Complaint  Patient presents with  . 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  Dr. Jodi Mourning denies angina. He has mild exertional dyspnea. He denies orthopnea. He still has difficulty with ambulation due to arthritis in the hips and knees despite replacement of all 4 lower extremity joints. He has not had syncope. There are no specific medication side effects.    Past Medical History  Diagnosis Date  . Diabetes mellitus   . Pure hypercholesterolemia   . Coronary atherosclerosis of native coronary artery   . Essential hypertension, benign   . MI (myocardial infarction) ([place]) 2004    Anterior wall MI treated with bare-metal stent in 2004. No ischemia by nuclear 7/12  . Chronic combined systolic and diastolic CHF (congestive heart failure) (HCC)     LVEF 40-45% by echo [id number]  . Obesity   . Osteoarthritis     Both hips and knees    Past Surgical History  Procedure Laterality Date  . Coronary stent placement  2004    Bare-metal stent for treatment of anterior wall MI   . Total hip arthroplasty Left   . Total hip arthroplasty Right      Current Outpatient Prescriptions  Medication Sig Dispense Refill  . amLODipine (NORVASC) 5 MG tablet TAKE 1 TABLET BY MOUTH ONCE DAILY 90 tablet 0  . atorvastatin (LIPITOR) 40 MG tablet TAKE 1 TABLET BY MOUTH ONCE DAILY 90 tablet 0  . carvedilol (COREG) 25 MG tablet TAKE 1 TABLET BY MOUTH TWICE DAILY WITH FOOD 180 tablet 3  . ciclopirox (LOPROX) 0.77 % cream Apply topically 2 (two) times daily. 90 g prn  . ciclopirox  (PENLAC) 8 % solution Apply topically at bedtime. Apply over nail and surrounding skin. Apply daily over previous coat. After seven (7) days, may remove with alcohol and continue cycle. 6.6 mL prn  . clopidogrel (PLAVIX) 75 MG tablet Take 75 mg by mouth daily.    . Exenatide ER (BYDUREON) 2 MG PEN Inject into the skin.    . Fluocinonide 0.1 % CREA Apply 1 application topically daily. For 14 days. 120 g 4  . metFORMIN (GLUCOPHAGE) 1000 MG tablet Take 1,000 mg by mouth 2 (two) times daily with a meal.    . oxyCODONE-acetaminophen (ROXICET) 5-325 MG per tablet Take 1 tablet by mouth every 4 (four) hours as needed for severe pain. 30 tablet 0  . ramipril (ALTACE) 10 MG capsule Take 10 mg by mouth daily.    . ranitidine (ZANTAC) 150 MG tablet Take 1 tablet (150 mg total) by mouth 2 (two) times daily. 60 tablet 11  . triamterene-hydrochlorothiazide (DYAZIDE) 37.5-25 MG per capsule Take 1 each (1 capsule total) by mouth every morning. 90 capsule 6   No current facility-administered medications for this visit.    Allergies:   Review of patient's allergies indicates no known allergies.    Social History:  The patient  reports that he has never smoked. He has never used smokeless tobacco. He reports that he drinks alcohol. He reports that he does not use illicit  drugs.   Family History:  The patient's family history includes Healthy in his father and mother.    ROS:  Please see the history of present illness.   Otherwise, review of systems are positive for back discomfort, occasional wheezing due to asthma, dyspnea on exertion, but otherwise no complaints. He specifically denies snoring. He has not yet had his medications this morning..   All other systems are reviewed and negative.    PHYSICAL EXAM: VS:  BP 134/96 mmHg  Pulse 104  Ht 5\' 6"  (1.676 m)  Wt 241 lb 6.4 oz (109.498 kg)  BMI 38.98 kg/m2 , BMI Body mass index is 38.98 kg/(m^2). GEN: Well nourished, well developed, in no acute  distress HEENT: normal Neck: no JVD, carotid bruits, or masses Cardiac: Rapid  RRR.  There is no murmur, rub, or gallop. There is no edema. Respiratory:  clear to auscultation bilaterally, normal work of breathing. GI: soft, nontender, nondistended, + BS MS: no deformity or atrophy Skin: warm and dry, no rash Neuro:  Strength and sensation are intact Psych: euthymic mood, full affect   EKG:  EKG is ordered today. The ekg reveals sinus tachycardia at 104 bpm (has not had his beta blocker yet this morning), large anteroseptal myocardial infarction with left anterior hemiblock also noted. Left ventricular hypertrophy also noted.   Recent Labs: 04/30/2015: Hemoglobin 13.4; Platelets 142*    Lipid Panel No results found for: CHOL, TRIG, HDL, CHOLHDL, VLDL, LDLCALC, LDLDIRECT    Wt Readings from Last 3 Encounters:  06/18/15 241 lb 6.4 oz (109.498 kg)  05/08/14 241 lb 6.4 oz (109.498 kg)  03/21/13 238 lb (107.956 kg)      Other studies Reviewed: Additional studies/ records that were reviewed today include: Identified that the most recent myocardial perfusion image was 2012. Fixed anteroapical defect.. The findings include most recent 2-D Doppler echocardiogram. 2013 with evidence of anteroapical infarct and EF of 40%.    ASSESSMENT AND PLAN:  1. Coronary artery disease involving native coronary artery of native heart with angina pectoris (HCC) No symptoms of angina.  2. Chronic combined systolic and diastolic CHF (congestive heart failure) (HCC) Exertional dyspnea possibly related to progression of heart failure.  3. Obesity (BMI 30-39.9) Morbid and unchanged.  4. Hyperlipidemia No recent data.  5. Essential hypertension, benign Controlled.  6. Uncontrolled type 2 diabetes mellitus with other circulatory complication, without long-term current use of insulin ([place]) Followed by primary care.    Current medicines are reviewed at length with the patient today.  The  patient has the following concerns regarding medicines: None. .  The following changes/actions have been instituted:    2-D Doppler echocardiogram to assess LV function  Weight loss via aerobic activity and caloric reduction  Consider stress imaging within the next 12 months.  Lipid panel in [place] to be done soon.   Labs/ tests ordered today include:   Orders Placed This Encounter  Procedures  . EKG 12-Lead  . Echocardiogram     Disposition:   FU with HS in 1 years  Signed, Sinclair Grooms, MD  06/18/2015 9:32 AM    [address], [place], [place]  [address] Phone: [phone]; Fax: [phone]

## 2015-07-02 ENCOUNTER — Telehealth: Payer: Self-pay

## 2015-07-02 NOTE — Telephone Encounter (Signed)
Pt cleared for Endoscopy procedure Letter faxed to their office [phone]; [facility] Attn: Dr. Francesca Jewett Bisc  Clearance per verbal orders with [name]; pt already has go instructions on medication adjustments from GI MD.

## 2015-07-03 MED FILL — GAVILYTE-G SOLUTION: 236 | 1 days supply | Qty: 4000 | Fill #0

## 2015-07-04 DIAGNOSIS — K5730 Diverticulosis of large intestine without perforation or abscess without bleeding: Secondary | ICD-10-CM | POA: Diagnosis not present

## 2015-07-04 DIAGNOSIS — Z1211 Encounter for screening for malignant neoplasm of colon: Secondary | ICD-10-CM | POA: Diagnosis not present

## 2015-07-04 DIAGNOSIS — K6389 Other specified diseases of intestine: Secondary | ICD-10-CM | POA: Diagnosis not present

## 2015-07-04 DIAGNOSIS — D124 Benign neoplasm of descending colon: Secondary | ICD-10-CM | POA: Diagnosis not present

## 2015-07-04 DIAGNOSIS — K635 Polyp of colon: Secondary | ICD-10-CM | POA: Diagnosis not present

## 2015-07-08 ENCOUNTER — Other Ambulatory Visit: Payer: Self-pay | Admitting: Certified Nurse Midwife

## 2015-07-08 DIAGNOSIS — R3989 Other symptoms and signs involving the genitourinary system: Secondary | ICD-10-CM

## 2015-07-08 MED ORDER — CLINDAMYCIN HCL 300 MG PO CAPS
300.0000 mg | ORAL_CAPSULE | Freq: Three times a day (TID) | ORAL | Status: DC
Start: 2015-07-08 — End: 2016-06-11

## 2015-07-08 MED FILL — CLINDAMYCIN HCL 300 MG CAPS: 300 | 14 days supply | Qty: 42 | Fill #0

## 2015-07-10 ENCOUNTER — Other Ambulatory Visit: Payer: Self-pay | Admitting: Interventional Cardiology

## 2015-07-28 ENCOUNTER — Other Ambulatory Visit: Payer: Self-pay | Admitting: Interventional Cardiology

## 2015-07-28 MED FILL — BYDUREON 2 MG PEN INJECT: 2 | 56 days supply | Qty: 8 | Fill #3

## 2015-07-28 MED FILL — TRIAMTERENE/HCTZ 37.5/25 CP: 37.5-25 | 90 days supply | Qty: 90 | Fill #1

## 2015-07-29 MED FILL — CARVEDILOL 25 MG TABLET: 25 | 90 days supply | Qty: 180 | Fill #0

## 2015-08-15 ENCOUNTER — Other Ambulatory Visit: Payer: Self-pay

## 2015-08-15 ENCOUNTER — Ambulatory Visit (HOSPITAL_COMMUNITY): Payer: 59 | Attending: Cardiovascular Disease

## 2015-08-15 DIAGNOSIS — I5042 Chronic combined systolic (congestive) and diastolic (congestive) heart failure: Secondary | ICD-10-CM

## 2015-09-19 ENCOUNTER — Other Ambulatory Visit: Payer: Self-pay | Admitting: Interventional Cardiology

## 2015-09-26 MED FILL — CLOPIDOGREL 75 MG TABLET: 75 | 90 days supply | Qty: 90 | Fill #0

## 2015-09-26 MED FILL — AMLODIPINE BESYLATE 5 MG TA: 5 | 90 days supply | Qty: 90 | Fill #0

## 2015-09-26 MED FILL — METFORMIN HCL ER 500 MG TAB: 500 | 90 days supply | Qty: 360 | Fill #3

## 2015-09-26 MED FILL — BYDUREON 2 MG PEN INJECT: 2 | 84 days supply | Qty: 12 | Fill #4

## 2015-09-26 MED FILL — RAMIPRIL 10 MG CAPSULE: 10 | 90 days supply | Qty: 180 | Fill #2

## 2015-11-14 ENCOUNTER — Other Ambulatory Visit: Payer: Self-pay | Admitting: Interventional Cardiology

## 2015-11-14 MED FILL — ATORVASTATIN 40 MG TABLET: 40 | 90 days supply | Qty: 90 | Fill #0

## 2015-11-14 MED FILL — FLUOCINONIDE 0.1% CREAM: 0.1 | 14 days supply | Qty: 120 | Fill #2

## 2015-11-14 MED FILL — TRIAMTERENE/HCTZ 37.5/25 CP: 37.5-25 | 90 days supply | Qty: 90 | Fill #0

## 2015-11-14 MED FILL — CARVEDILOL 25 MG TABLET: 25 | 90 days supply | Qty: 180 | Fill #1

## 2016-01-09 MED FILL — BYDUREON 2 MG PEN INJECT: 2 | 55 days supply | Qty: 8 | Fill #5

## 2016-01-09 MED FILL — METFORMIN HCL ER 500 MG TAB: 500 | 90 days supply | Qty: 360 | Fill #0

## 2016-01-09 MED FILL — CLOPIDOGREL 75 MG TABLET: 75 | 90 days supply | Qty: 90 | Fill #1

## 2016-01-09 MED FILL — RAMIPRIL 10 MG CAPSULE: 10 | 90 days supply | Qty: 180 | Fill #3

## 2016-01-09 MED FILL — AMLODIPINE BESYLATE 5 MG TA: 5 | 90 days supply | Qty: 90 | Fill #1

## 2016-01-12 DIAGNOSIS — G5622 Lesion of ulnar nerve, left upper limb: Secondary | ICD-10-CM | POA: Diagnosis not present

## 2016-01-28 DIAGNOSIS — G5622 Lesion of ulnar nerve, left upper limb: Secondary | ICD-10-CM | POA: Diagnosis not present

## 2016-02-03 DIAGNOSIS — G5602 Carpal tunnel syndrome, left upper limb: Secondary | ICD-10-CM | POA: Diagnosis not present

## 2016-03-02 MED FILL — CARVEDILOL 25 MG TABLET: 25 | 90 days supply | Qty: 180 | Fill #2

## 2016-03-02 MED FILL — TRIAMTERENE/HCTZ 37.5/25 CP: 37.5-25 | 90 days supply | Qty: 90 | Fill #1

## 2016-03-02 MED FILL — BYDUREON 2 MG PEN INJECT: 2 | 84 days supply | Qty: 12 | Fill #0

## 2016-03-03 ENCOUNTER — Other Ambulatory Visit: Payer: Self-pay | Admitting: Certified Nurse Midwife

## 2016-03-03 DIAGNOSIS — R399 Unspecified symptoms and signs involving the genitourinary system: Principal | ICD-10-CM

## 2016-03-03 DIAGNOSIS — N429 Disorder of prostate, unspecified: Secondary | ICD-10-CM

## 2016-03-03 MED ORDER — CEFIXIME 400 MG PO TABS: 400.0000 mg | ORAL_TABLET | Freq: Every day | ORAL | 2 refills | Status: DC

## 2016-04-02 DIAGNOSIS — M7061 Trochanteric bursitis, right hip: Secondary | ICD-10-CM | POA: Diagnosis not present

## 2016-04-02 DIAGNOSIS — Z96652 Presence of left artificial knee joint: Secondary | ICD-10-CM | POA: Diagnosis not present

## 2016-04-02 DIAGNOSIS — M1711 Unilateral primary osteoarthritis, right knee: Secondary | ICD-10-CM | POA: Diagnosis not present

## 2016-04-02 DIAGNOSIS — Z96641 Presence of right artificial hip joint: Secondary | ICD-10-CM | POA: Diagnosis not present

## 2016-04-02 DIAGNOSIS — Z471 Aftercare following joint replacement surgery: Secondary | ICD-10-CM | POA: Diagnosis not present

## 2016-04-13 DIAGNOSIS — M1711 Unilateral primary osteoarthritis, right knee: Secondary | ICD-10-CM | POA: Diagnosis not present

## 2016-04-14 MED FILL — RAMIPRIL 10 MG CAPSULE: 10 | 90 days supply | Qty: 180 | Fill #0

## 2016-04-14 MED FILL — CLOPIDOGREL 75 MG TABLET: 75 | 90 days supply | Qty: 90 | Fill #2

## 2016-04-14 MED FILL — METFORMIN HCL ER 500 MG TAB: 500 | 90 days supply | Qty: 360 | Fill #1

## 2016-04-14 MED FILL — AMLODIPINE BESYLATE 5 MG TA: 5 | 90 days supply | Qty: 90 | Fill #2

## 2016-05-25 MED FILL — BYDUREON 2 MG PEN INJECT: 2 | 84 days supply | Qty: 12 | Fill #1

## 2016-05-25 MED FILL — CARVEDILOL 25 MG TABLET: 25 | 90 days supply | Qty: 180 | Fill #3

## 2016-05-25 MED FILL — TRIAMTERENE/HCTZ 37.5/25 CP: 37.5-25 | 90 days supply | Qty: 90 | Fill #2

## 2016-06-08 ENCOUNTER — Other Ambulatory Visit: Payer: Self-pay | Admitting: Orthopedic Surgery

## 2016-06-11 ENCOUNTER — Encounter (HOSPITAL_BASED_OUTPATIENT_CLINIC_OR_DEPARTMENT_OTHER): Payer: Self-pay | Admitting: *Deleted

## 2016-06-11 NOTE — Progress Notes (Signed)
   06/11/16 1309  OBSTRUCTIVE SLEEP APNEA  Have you ever been diagnosed with sleep apnea through a sleep study? No  Do you snore loudly (loud enough to be heard through closed doors)?  0  Do you often feel tired, fatigued, or sleepy during the daytime (such as falling asleep during driving or talking to someone)? 0  Has anyone observed you stop breathing during your sleep? 0  Do you have, or are you being treated for high blood pressure? 1  BMI more than 35 kg/m2? 1  Age > 57 (1-yes) 1  Male Gender (Yes=1) 1  Obstructive Sleep Apnea Score 4  Score 5 or greater  Results sent to PCP (Dr. Marton Redwood)

## 2016-06-11 NOTE — Pre-Procedure Instructions (Signed)
To come for BMET and EKG 

## 2016-06-17 ENCOUNTER — Encounter (HOSPITAL_BASED_OUTPATIENT_CLINIC_OR_DEPARTMENT_OTHER)
Admission: RE | Admit: 2016-06-17 | Discharge: 2016-06-17 | Disposition: A | Discharge status: Home / Self Care | Payer: 59 | Source: Ambulatory Visit | Attending: Orthopedic Surgery | Admitting: Orthopedic Surgery

## 2016-06-17 DIAGNOSIS — I110 Hypertensive heart disease with heart failure: Secondary | ICD-10-CM | POA: Diagnosis not present

## 2016-06-17 DIAGNOSIS — I25119 Atherosclerotic heart disease of native coronary artery with unspecified angina pectoris: Secondary | ICD-10-CM

## 2016-06-17 DIAGNOSIS — Z79899 Other long term (current) drug therapy: Secondary | ICD-10-CM | POA: Diagnosis not present

## 2016-06-17 DIAGNOSIS — I252 Old myocardial infarction: Secondary | ICD-10-CM | POA: Diagnosis not present

## 2016-06-17 DIAGNOSIS — E119 Type 2 diabetes mellitus without complications: Secondary | ICD-10-CM | POA: Diagnosis not present

## 2016-06-17 DIAGNOSIS — I10 Essential (primary) hypertension: Secondary | ICD-10-CM

## 2016-06-17 DIAGNOSIS — I2510 Atherosclerotic heart disease of native coronary artery without angina pectoris: Secondary | ICD-10-CM | POA: Diagnosis not present

## 2016-06-17 DIAGNOSIS — Z7984 Long term (current) use of oral hypoglycemic drugs: Secondary | ICD-10-CM | POA: Diagnosis not present

## 2016-06-17 DIAGNOSIS — Z6836 Body mass index (BMI) 36.0-36.9, adult: Secondary | ICD-10-CM | POA: Diagnosis not present

## 2016-06-17 DIAGNOSIS — M1990 Unspecified osteoarthritis, unspecified site: Secondary | ICD-10-CM | POA: Diagnosis not present

## 2016-06-17 DIAGNOSIS — E669 Obesity, unspecified: Secondary | ICD-10-CM | POA: Diagnosis not present

## 2016-06-17 DIAGNOSIS — I5042 Chronic combined systolic (congestive) and diastolic (congestive) heart failure: Secondary | ICD-10-CM | POA: Insufficient documentation

## 2016-06-17 DIAGNOSIS — Z955 Presence of coronary angioplasty implant and graft: Secondary | ICD-10-CM | POA: Diagnosis not present

## 2016-06-17 DIAGNOSIS — E7800 Pure hypercholesterolemia, unspecified: Secondary | ICD-10-CM | POA: Diagnosis not present

## 2016-06-17 DIAGNOSIS — G5622 Lesion of ulnar nerve, left upper limb: Secondary | ICD-10-CM | POA: Diagnosis not present

## 2016-06-17 DIAGNOSIS — Z96643 Presence of artificial hip joint, bilateral: Secondary | ICD-10-CM | POA: Diagnosis not present

## 2016-06-17 DIAGNOSIS — Z96652 Presence of left artificial knee joint: Secondary | ICD-10-CM | POA: Diagnosis not present

## 2016-06-17 LAB — BASIC METABOLIC PANEL
Anion gap: 8 (ref 5–15)
BUN: 8 mg/dL (ref 6–20)
CHLORIDE: 101 mmol/L (ref 101–111)
CO2: 29 mmol/L (ref 22–32)
Calcium: 9 mg/dL (ref 8.9–10.3)
Creatinine, Ser: 1.1 mg/dL (ref 0.61–1.24)
GFR calc Af Amer: >60 mL/min (ref >60)
GFR calc non Af Amer: >60 mL/min (ref >60)
GLUCOSE: 211 mg/dL — AB (ref 65–99)
POTASSIUM: 5.1 mmol/L (ref 3.5–5.1)
Sodium: 138 mmol/L (ref 135–145)

## 2016-06-17 NOTE — Progress Notes (Signed)
EKG reviewed by Dr. Hollis, will proceed with surgery as scheduled.  

## 2016-06-18 ENCOUNTER — Ambulatory Visit (HOSPITAL_BASED_OUTPATIENT_CLINIC_OR_DEPARTMENT_OTHER): Payer: 59 | Admitting: Anesthesiology

## 2016-06-18 ENCOUNTER — Encounter (HOSPITAL_BASED_OUTPATIENT_CLINIC_OR_DEPARTMENT_OTHER)
Admission: RE | Disposition: A | Discharge status: Home / Self Care | Payer: Self-pay | Source: Ambulatory Visit | Attending: Orthopedic Surgery

## 2016-06-18 ENCOUNTER — Encounter (HOSPITAL_BASED_OUTPATIENT_CLINIC_OR_DEPARTMENT_OTHER): Payer: Self-pay | Admitting: *Deleted

## 2016-06-18 ENCOUNTER — Ambulatory Visit (HOSPITAL_BASED_OUTPATIENT_CLINIC_OR_DEPARTMENT_OTHER)
Admission: RE | Admit: 2016-06-18 | Discharge: 2016-06-18 | Disposition: A | Discharge status: Home / Self Care | Payer: 59 | Source: Ambulatory Visit | Attending: Orthopedic Surgery | Admitting: Orthopedic Surgery

## 2016-06-18 DIAGNOSIS — I110 Hypertensive heart disease with heart failure: Secondary | ICD-10-CM | POA: Diagnosis not present

## 2016-06-18 DIAGNOSIS — E119 Type 2 diabetes mellitus without complications: Secondary | ICD-10-CM | POA: Diagnosis not present

## 2016-06-18 DIAGNOSIS — E7800 Pure hypercholesterolemia, unspecified: Secondary | ICD-10-CM | POA: Insufficient documentation

## 2016-06-18 DIAGNOSIS — Z6836 Body mass index (BMI) 36.0-36.9, adult: Secondary | ICD-10-CM | POA: Insufficient documentation

## 2016-06-18 DIAGNOSIS — E669 Obesity, unspecified: Secondary | ICD-10-CM | POA: Diagnosis not present

## 2016-06-18 DIAGNOSIS — Z96643 Presence of artificial hip joint, bilateral: Secondary | ICD-10-CM | POA: Insufficient documentation

## 2016-06-18 DIAGNOSIS — I2510 Atherosclerotic heart disease of native coronary artery without angina pectoris: Secondary | ICD-10-CM | POA: Diagnosis not present

## 2016-06-18 DIAGNOSIS — I509 Heart failure, unspecified: Secondary | ICD-10-CM | POA: Diagnosis not present

## 2016-06-18 DIAGNOSIS — I5042 Chronic combined systolic (congestive) and diastolic (congestive) heart failure: Secondary | ICD-10-CM | POA: Insufficient documentation

## 2016-06-18 DIAGNOSIS — M1990 Unspecified osteoarthritis, unspecified site: Secondary | ICD-10-CM | POA: Diagnosis not present

## 2016-06-18 DIAGNOSIS — Z7984 Long term (current) use of oral hypoglycemic drugs: Secondary | ICD-10-CM | POA: Insufficient documentation

## 2016-06-18 DIAGNOSIS — G5622 Lesion of ulnar nerve, left upper limb: Secondary | ICD-10-CM | POA: Diagnosis not present

## 2016-06-18 DIAGNOSIS — Z79899 Other long term (current) drug therapy: Secondary | ICD-10-CM | POA: Insufficient documentation

## 2016-06-18 DIAGNOSIS — I252 Old myocardial infarction: Secondary | ICD-10-CM | POA: Insufficient documentation

## 2016-06-18 DIAGNOSIS — Z955 Presence of coronary angioplasty implant and graft: Secondary | ICD-10-CM | POA: Insufficient documentation

## 2016-06-18 DIAGNOSIS — Z96652 Presence of left artificial knee joint: Secondary | ICD-10-CM | POA: Insufficient documentation

## 2016-06-18 HISTORY — PX: ULNAR TUNNEL RELEASE: SHX820

## 2016-06-18 HISTORY — DX: Dental restoration status: Z98.811

## 2016-06-18 HISTORY — DX: Type 2 diabetes mellitus without complications: E11.9

## 2016-06-18 LAB — GLUCOSE, CAPILLARY
Glucose-Capillary: 135 mg/dL — ABNORMAL HIGH (ref 65–99)
Glucose-Capillary: 121 mg/dL — ABNORMAL HIGH (ref 65–99)

## 2016-06-18 SURGERY — RELEASE, CUBITAL TUNNEL
Anesthesia: General | Site: Elbow | Laterality: Left

## 2016-06-18 MED ORDER — FENTANYL CITRATE (PF) 100 MCG/2ML IJ SOLN
50.0000 ug | INTRAMUSCULAR | Status: DC | PRN
  Administered 2016-06-18: 100 ug via INTRAVENOUS

## 2016-06-18 MED ORDER — CEFAZOLIN SODIUM-DEXTROSE 2-4 GM/100ML-% IV SOLN
INTRAVENOUS | Status: AC
  Filled 2016-06-18: qty 100

## 2016-06-18 MED ORDER — PROPOFOL 10 MG/ML IV BOLUS
INTRAVENOUS | Status: DC | PRN
  Administered 2016-06-18: 150 mg via INTRAVENOUS
  Administered 2016-06-18: 50 mg via INTRAVENOUS

## 2016-06-18 MED ORDER — LIDOCAINE HCL (CARDIAC) 20 MG/ML IV SOLN
INTRAVENOUS | Status: DC | PRN
  Administered 2016-06-18: 50 mg via INTRAVENOUS

## 2016-06-18 MED ORDER — ONDANSETRON HCL 4 MG/2ML IJ SOLN
INTRAMUSCULAR | Status: DC | PRN
  Administered 2016-06-18: 4 mg via INTRAVENOUS

## 2016-06-18 MED ORDER — DEXAMETHASONE SODIUM PHOSPHATE 10 MG/ML IJ SOLN
INTRAMUSCULAR | Status: DC | PRN
  Administered 2016-06-18: 4 mg via INTRAVENOUS

## 2016-06-18 MED ORDER — CEFAZOLIN SODIUM-DEXTROSE 2-4 GM/100ML-% IV SOLN
2.0000 g | INTRAVENOUS | Status: AC
  Administered 2016-06-18: 2 g via INTRAVENOUS

## 2016-06-18 MED ORDER — HYDROMORPHONE HCL 1 MG/ML IJ SOLN
INTRAMUSCULAR | Status: AC
  Filled 2016-06-18: qty 1

## 2016-06-18 MED ORDER — FENTANYL CITRATE (PF) 100 MCG/2ML IJ SOLN
INTRAMUSCULAR | Status: AC
  Filled 2016-06-18: qty 2

## 2016-06-18 MED ORDER — MIDAZOLAM HCL 2 MG/2ML IJ SOLN
1.0000 mg | INTRAMUSCULAR | Status: DC | PRN
  Administered 2016-06-18 (×2): 1 mg via INTRAVENOUS

## 2016-06-18 MED ORDER — CHLORHEXIDINE GLUCONATE 4 % EX LIQD: 60.0000 mL | Freq: Once | CUTANEOUS | Status: DC

## 2016-06-18 MED ORDER — BUPIVACAINE HCL (PF) 0.25 % IJ SOLN
INTRAMUSCULAR | Status: DC | PRN
  Administered 2016-06-18: 20 mL

## 2016-06-18 MED ORDER — LACTATED RINGERS IV SOLN
INTRAVENOUS | Status: DC
Start: 2016-06-18 — End: 2016-06-18
  Administered 2016-06-18: 12:00:00 via INTRAVENOUS

## 2016-06-18 MED ORDER — OXYCODONE-ACETAMINOPHEN 5-325 MG PO TABS: 1.0000 | ORAL_TABLET | ORAL | 0 refills | Status: DC | PRN

## 2016-06-18 MED ORDER — SCOPOLAMINE 1 MG/3DAYS TD PT72: 1.0000 | MEDICATED_PATCH | Freq: Once | TRANSDERMAL | Status: DC | PRN

## 2016-06-18 MED ORDER — MIDAZOLAM HCL 2 MG/2ML IJ SOLN
INTRAMUSCULAR | Status: AC
  Filled 2016-06-18: qty 2

## 2016-06-18 MED ORDER — HYDROMORPHONE HCL 1 MG/ML IJ SOLN
0.2500 mg | INTRAMUSCULAR | Status: DC | PRN
  Administered 2016-06-18: 0.25 mg via INTRAVENOUS

## 2016-06-18 MED FILL — OXYCODONE W/APAP 5/325 TAB: 5-325 | 5 days supply | Qty: 30 | Fill #0

## 2016-06-18 SURGICAL SUPPLY — 58 items
APL SKNCLS STERI-STRIP NONHPOA (GAUZE/BANDAGES/DRESSINGS)
BANDAGE ACE 4X5 VEL STRL LF (GAUZE/BANDAGES/DRESSINGS) ×1 IMPLANT
BENZOIN TINCTURE PRP APPL 2/3 (GAUZE/BANDAGES/DRESSINGS) IMPLANT
BLADE SURG 15 STRL LF DISP TIS (BLADE) ×1 IMPLANT
BLADE SURG 15 STRL SS (BLADE) ×2
BNDG CMPR 9X4 STRL LF SNTH (GAUZE/BANDAGES/DRESSINGS) ×1
BNDG ESMARK 4X9 LF (GAUZE/BANDAGES/DRESSINGS) ×2 IMPLANT
BNDG GAUZE ELAST 4 BULKY (GAUZE/BANDAGES/DRESSINGS) ×2 IMPLANT
CORDS BIPOLAR (ELECTRODE) ×2 IMPLANT
COVER BACK TABLE 60X90IN (DRAPES) ×2 IMPLANT
CUFF TOURN SGL LL 18 NRW (TOURNIQUET CUFF) ×1 IMPLANT
CUFF TOURNIQUET SINGLE 24IN (TOURNIQUET CUFF) IMPLANT
DECANTER SPIKE VIAL GLASS SM (MISCELLANEOUS) IMPLANT
DRAPE EXTREMITY T 121X128X90 (DRAPE) ×2 IMPLANT
DRAPE SURG 17X23 STRL (DRAPES) ×2 IMPLANT
DRSG PAD ABDOMINAL 8X10 ST (GAUZE/BANDAGES/DRESSINGS) ×1 IMPLANT
DURAPREP 26ML APPLICATOR (WOUND CARE) ×2 IMPLANT
GAUZE SPONGE 4X4 12PLY STRL (GAUZE/BANDAGES/DRESSINGS) ×2 IMPLANT
GAUZE SPONGE 4X4 16PLY XRAY LF (GAUZE/BANDAGES/DRESSINGS) IMPLANT
GLOVE BIO SURGEON STRL SZ 6.5 (GLOVE) ×1 IMPLANT
GLOVE BIOGEL PI IND STRL 7.0 (GLOVE) IMPLANT
GLOVE BIOGEL PI IND STRL 8 (GLOVE) IMPLANT
GLOVE BIOGEL PI INDICATOR 7.0 (GLOVE) ×1
GLOVE BIOGEL PI INDICATOR 8 (GLOVE) ×1
GLOVE SURG SYN 8.0 (GLOVE) ×2 IMPLANT
GLOVE SURG SYN 8.0 PF PI (GLOVE) ×1 IMPLANT
GOWN STRL REUS W/ TWL LRG LVL3 (GOWN DISPOSABLE) ×1 IMPLANT
GOWN STRL REUS W/TWL LRG LVL3 (GOWN DISPOSABLE) ×2
GOWN STRL REUS W/TWL XL LVL3 (GOWN DISPOSABLE) ×2 IMPLANT
LOOP VESSEL MAXI BLUE (MISCELLANEOUS) IMPLANT
NDL HYPO 25X1 1.5 SAFETY (NEEDLE) IMPLANT
NEEDLE HYPO 25X1 1.5 SAFETY (NEEDLE) IMPLANT
NS IRRIG 1000ML POUR BTL (IV SOLUTION) ×2 IMPLANT
PACK BASIN DAY SURGERY FS (CUSTOM PROCEDURE TRAY) ×2 IMPLANT
PAD CAST 3X4 CTTN HI CHSV (CAST SUPPLIES) IMPLANT
PAD CAST 4YDX4 CTTN HI CHSV (CAST SUPPLIES) IMPLANT
PADDING CAST ABS 4INX4YD NS (CAST SUPPLIES) ×1
PADDING CAST ABS COTTON 4X4 ST (CAST SUPPLIES) ×1 IMPLANT
PADDING CAST COTTON 3X4 STRL (CAST SUPPLIES)
PADDING CAST COTTON 4X4 STRL (CAST SUPPLIES)
SHEET MEDIUM DRAPE 40X70 STRL (DRAPES) ×2 IMPLANT
SLEEVE SCD COMPRESS KNEE MED (MISCELLANEOUS) ×1 IMPLANT
SLING ARM FOAM STRAP LRG (SOFTGOODS) IMPLANT
SLING ARM FOAM STRAP MED (SOFTGOODS) ×1 IMPLANT
SLING ARM MED ADULT FOAM STRAP (SOFTGOODS) IMPLANT
STOCKINETTE 4X48 STRL (DRAPES) ×2 IMPLANT
STRIP CLOSURE SKIN 1/2X4 (GAUZE/BANDAGES/DRESSINGS) IMPLANT
SUT ETHIBOND 2 OS 4 DA (SUTURE) IMPLANT
SUT PROLENE 3 0 PS 2 (SUTURE) IMPLANT
SUT VIC AB 2-0 SH 27 (SUTURE)
SUT VIC AB 2-0 SH 27XBRD (SUTURE) IMPLANT
SUT VIC AB 3-0 X1 27 (SUTURE) IMPLANT
SUT VICRYL RAPIDE 4-0 (SUTURE) IMPLANT
SUT VICRYL RAPIDE 4/0 PS 2 (SUTURE) IMPLANT
SYR 10ML LL (SYRINGE) IMPLANT
SYR BULB 3OZ (MISCELLANEOUS) ×2 IMPLANT
TOWEL OR 17X24 6PK STRL BLUE (TOWEL DISPOSABLE) ×3 IMPLANT
UNDERPAD 30X30 (UNDERPADS AND DIAPERS) ×2 IMPLANT

## 2016-06-18 NOTE — Op Note (Signed)
Please see the dictated note [phone]

## 2016-06-18 NOTE — Discharge Instructions (Signed)

## 2016-06-18 NOTE — Anesthesia Postprocedure Evaluation (Signed)
Anesthesia Post Note  Patient: Ryan Bombard, MD  Procedure(s) Performed: Procedure(s) (LRB): LEFT CUBITAL TUNNEL RELEASE (Left)  Patient location during evaluation: PACU Anesthesia Type: General Level of consciousness: awake Pain management: pain level controlled Vital Signs Assessment: post-procedure vital signs reviewed and stable Respiratory status: spontaneous breathing Cardiovascular status: stable Anesthetic complications: no       Last Vitals:  Vitals:   06/18/16 1430 06/18/16 1445  BP: 116/77 115/85  Pulse: 83 81  Resp: 19 20  Temp:      Last Pain:  Vitals:   06/18/16 1445  TempSrc:   PainSc: 4                  Tequila Rottmann

## 2016-06-18 NOTE — Transfer of Care (Signed)
Immediate Anesthesia Transfer of Care Note  Patient: Ryan Bombard, MD  Procedure(s) Performed: Procedure(s): LEFT CUBITAL TUNNEL RELEASE (Left)  Patient Location: PACU  Anesthesia Type:General  Level of Consciousness: awake, alert  and oriented  Airway & Oxygen Therapy: Patient Spontanous Breathing and Patient connected to face mask oxygen  Post-op Assessment: Report given to RN and Post -op Vital signs reviewed and stable  Post vital signs: Reviewed and stable  Last Vitals:  Vitals:   06/18/16 1123  BP: 138/87  Pulse: 92  Resp: 18  Temp: 36.7 C    Last Pain:  Vitals:   06/18/16 1123  TempSrc: Oral         Complications: No apparent anesthesia complications

## 2016-06-18 NOTE — Anesthesia Preprocedure Evaluation (Addendum)
Anesthesia Evaluation  Patient identified by MRN, date of birth, ID band Patient awake    Reviewed: Allergy & Precautions, NPO status , Patient's Chart, lab work & pertinent test results  Airway Mallampati: II  TM Distance: >3 FB     Dental   Pulmonary    breath sounds clear to auscultation       Cardiovascular hypertension, + CAD, + Past MI and +CHF   Rhythm:Regular Rate:Normal     Neuro/Psych    GI/Hepatic negative GI ROS, Neg liver ROS, Patient did not received Oral Contrast Agents,  Endo/Other  diabetes  Renal/GU negative Renal ROS     Musculoskeletal   Abdominal   Peds  Hematology   Anesthesia Other Findings   Reproductive/Obstetrics                            Anesthesia Physical Anesthesia Plan  ASA: III  Anesthesia Plan: General   Post-op Pain Management:    Induction: Intravenous  Airway Management Planned: LMA  Additional Equipment:   Intra-op Plan:   Post-operative Plan:   Informed Consent: I have reviewed the patients History and Physical, chart, labs and discussed the procedure including the risks, benefits and alternatives for the proposed anesthesia with the patient or authorized representative who has indicated his/her understanding and acceptance.   Dental advisory given  Plan Discussed with: CRNA and Anesthesiologist  Anesthesia Plan Comments:         Anesthesia Quick Evaluation

## 2016-06-18 NOTE — Anesthesia Procedure Notes (Signed)
Procedure Name: LMA Insertion Date/Time: 06/18/2016 1:31 PM Performed by: Melynda Ripple D Pre-anesthesia Checklist: Patient identified, Emergency Drugs available, Suction available and Patient being monitored Patient Re-evaluated:Patient Re-evaluated prior to inductionOxygen Delivery Method: Circle system utilized Preoxygenation: Pre-oxygenation with 100% oxygen Intubation Type: IV induction Ventilation: Mask ventilation without difficulty LMA: LMA inserted LMA Size: 5.0 Number of attempts: 1 Airway Equipment and Method: Bite block Placement Confirmation: positive ETCO2 Tube secured with: Tape Dental Injury: Teeth and Oropharynx as per pre-operative assessment

## 2016-06-18 NOTE — H&P (Signed)
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   Chief Complaint: Persistent numbness and tingling in the left ring and small fingers. 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  Past Medical History:  Diagnosis Date  . Chronic combined systolic and diastolic CHF (congestive heart failure) ([place])   . Coronary atherosclerosis of native coronary artery   . Dental crowns present   . Essential hypertension, benign    states under control with meds., has been on med. x 15 yr.  . MI (myocardial infarction) 10/27/2002  . Non-insulin dependent type 2 diabetes mellitus ([place])   . Obesity   . Osteoarthritis    hips and knees  . Pure hypercholesterolemia     Past Surgical History:  Procedure Laterality Date  . CARDIAC CATHETERIZATION  10/27/2002  . CARPAL TUNNEL RELEASE Right 11/21/2013  . CORONARY STENT PLACEMENT  10/27/2002   mid LAD  . TOTAL HIP ARTHROPLASTY Left 04/09/1999  . TOTAL HIP ARTHROPLASTY Right   . TOTAL KNEE ARTHROPLASTY Left 02/03/2011    Family History  Problem Relation Age of Onset  . Healthy Mother   . Healthy Father    Social History:  reports that he has never smoked. He has never used smokeless tobacco. He reports that he drinks alcohol. He reports that he does not use drugs.  Allergies: No Known Allergies  Medications Prior to Admission  Medication Sig Dispense Refill  . amLODipine (NORVASC) 5 MG tablet TAKE 1 TABLET BY MOUTH ONCE DAILY 90 tablet 3  . carvedilol (COREG) 25 MG tablet TAKE 1 TABLET BY MOUTH TWICE A DAY WITH FOOD 180 tablet 3  . clopidogrel (PLAVIX) 75 MG tablet Take 75 mg by mouth daily.    . Exenatide ER (BYDUREON) 2 MG PEN Inject into the skin once a week.     . metFORMIN (GLUCOPHAGE) 1000 MG tablet Take 1,000 mg by mouth 2 (two) times daily with a meal.     . ramipril (ALTACE) 10 MG capsule Take 10 mg by mouth 2 (two) times daily.     Marland Kitchen triamterene-hydrochlorothiazide (DYAZIDE) 37.5-25 MG capsule TAKE 1 CAPSULE BY MOUTH EVERY MORNING. 90 capsule 3  . ciclopirox (LOPROX) 0.77 % cream Apply topically 2 (two) times daily. 90 g prn    Results for orders placed or performed during the hospital encounter of 06/18/16 (from the past 48 hour(s))  Basic metabolic panel     Status: Abnormal   Collection Time: 06/17/16  3:06 PM  Result Value Ref Range   Sodium 138 135 - 145 mmol/L   Potassium 5.1 3.5 - 5.1 mmol/L   Chloride 101 101 - 111 mmol/L   CO2 29 22 - 32 mmol/L   Glucose, Bld 211 (H) 65 - 99 mg/dL   BUN 8 6 - 20 mg/dL   Creatinine, Ser 1.10 0.61 - 1.24 mg/dL   Calcium 9.0 8.9 - 10.3 mg/dL   GFR calc non Af Amer >60 >60 mL/min   GFR calc Af Amer >60 >60 mL/min    Comment: (NOTE) The eGFR has been calculated using the CKD EPI equation. This calculation has not been validated in all clinical situations. eGFR's persistently <60 mL/min signify possible Chronic Kidney Disease.    Anion gap 8 5 - 15  Glucose, capillary     Status: Abnormal   Collection Time:  06/18/16 11:50 AM  Result Value Ref Range   Glucose-Capillary 135 (H) 65 - 99 mg/dL   No results found.  Review of Systems  All other systems reviewed and are negative.   Blood pressure 138/87, pulse 92, temperature 98.1 F (36.7 C), temperature source Oral, resp. rate 18, height 5' 6" (1.676 m), weight 103 kg (227 lb), SpO2 99 %. Physical Exam  Constitutional: He is oriented to person, place, and time. He appears well-developed and well-nourished.  HENT:  Head: Normocephalic and atraumatic.  Neck: Normal range of motion.  Cardiovascular: Normal rate.   Respiratory: Effort normal.  Musculoskeletal:       Left elbow: Tenderness found. Medial epicondyle tenderness noted.  Left elbow medial tenderness with positive Tinel's with symptoms in the ring and small finger distally.   Neurological: He is alert and oriented to person, place, and time.  Skin: Skin is warm.  Psychiatric: He has a normal mood and affect. His behavior is normal. Judgment and thought content normal.     Assessment/Plan As above. Plan left cubital tunnel release.  Schuyler Amor, MD 06/18/2016, 1:13 PM

## 2016-06-21 ENCOUNTER — Encounter (HOSPITAL_BASED_OUTPATIENT_CLINIC_OR_DEPARTMENT_OTHER): Payer: Self-pay | Admitting: Orthopedic Surgery

## 2016-06-21 NOTE — Op Note (Signed)
Ryan Walsh, Ryan Walsh NO.:  [id number]  MEDICAL RECORD NO.:  [id number]  LOCATION:                                 FACILITY:  PHYSICIAN:  Sheral Apley. 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  PROCEDURE:  Left cubital tunnel release.  SURGEON:  Sheral Apley. Burney Gauze, M.D..  ASSISTANT:  None.  ANESTHESIA:  General.  COMPLICATION:  No.  DRAINS:  No drains.  DESCRIPTION OF PROCEDURE:  The patient was taken to the operating suite. After induction of adequate general anesthetic, left upper extremity was prepped and draped in usual sterile fashion.  An Esmarch was used to exsanguinate the limb.  The tourniquet was then inflated to 250 mmHg. At this point in time, incision was made on the medial aspect of the left elbow centered between the olecranon process and the tip of the medial epicondyle.  Skin was incised sharply.  Blunt dissection was carried down to the medial aspect of the elbow with care to identify and retract branches of the medial antebrachial cutaneous nerve.  The cubital tunnel was identified and reduced from distal to proximal.  We traced the ulnar nerve under a skin bridge for 6 to 8 cm, releasing all pressure points including release of the intermuscular septum off the medial epicondyle.  Distally, we decompressed the nerve into the layer of fascia overlying the flexor carpi ulnaris muscle.  All pressure points were relieved.  The nerve was stable in the groove with flexion extension.  The wound was irrigated and loosely closed in layers of 2-0 and I Vicryl and a 3-0 Prolene subcuticular stitch on the skin.  Steri-Strips, 4x4s, fluffs, and compressive dressing was applied.  The patient tolerated  this procedure well and went to recovery room in stable fashion.     Sheral Apley Burney Gauze, M.D.   ______________________________ Sheral Apley. Burney Gauze, M.D.    MAW/MEDQ  D:  06/18/2016  T:  06/19/2016  Job:  [id number]

## 2016-06-24 DIAGNOSIS — R972 Elevated prostate specific antigen [PSA]: Secondary | ICD-10-CM | POA: Diagnosis not present

## 2016-06-28 DIAGNOSIS — R972 Elevated prostate specific antigen [PSA]: Secondary | ICD-10-CM | POA: Diagnosis not present

## 2016-06-28 DIAGNOSIS — N5201 Erectile dysfunction due to arterial insufficiency: Secondary | ICD-10-CM | POA: Diagnosis not present

## 2016-06-29 ENCOUNTER — Ambulatory Visit: Payer: Self-pay | Admitting: Interventional Cardiology

## 2016-06-29 DIAGNOSIS — G5622 Lesion of ulnar nerve, left upper limb: Secondary | ICD-10-CM | POA: Insufficient documentation

## 2016-07-26 ENCOUNTER — Other Ambulatory Visit: Payer: Self-pay | Admitting: Certified Nurse Midwife

## 2016-07-26 ENCOUNTER — Other Ambulatory Visit: Payer: Self-pay | Admitting: Interventional Cardiology

## 2016-07-26 DIAGNOSIS — J029 Acute pharyngitis, unspecified: Secondary | ICD-10-CM

## 2016-07-26 MED ORDER — AMOXICILLIN-POT CLAVULANATE 875-125 MG PO TABS: 1.0000 | ORAL_TABLET | Freq: Two times a day (BID) | ORAL | 0 refills | Status: DC

## 2016-07-26 MED FILL — CLOPIDOGREL 75 MG TABLET: 75 | 90 days supply | Qty: 90 | Fill #3

## 2016-07-26 MED FILL — METFORMIN HCL ER 500 MG TAB: 500 | 90 days supply | Qty: 360 | Fill #0

## 2016-07-26 MED FILL — RAMIPRIL 10 MG CAPSULE: 10 | 90 days supply | Qty: 180 | Fill #1

## 2016-07-27 MED FILL — AMLODIPINE BESYLATE 5 MG TA: 5 | 90 days supply | Qty: 90 | Fill #0

## 2016-07-28 ENCOUNTER — Other Ambulatory Visit: Payer: Self-pay | Admitting: Certified Nurse Midwife

## 2016-07-28 DIAGNOSIS — J029 Acute pharyngitis, unspecified: Secondary | ICD-10-CM

## 2016-07-28 MED ORDER — AMOXICILLIN-POT CLAVULANATE 875-125 MG PO TABS: 1.0000 | ORAL_TABLET | Freq: Two times a day (BID) | ORAL | 0 refills | Status: DC

## 2016-07-29 ENCOUNTER — Other Ambulatory Visit: Payer: Self-pay

## 2016-07-29 DIAGNOSIS — R972 Elevated prostate specific antigen [PSA]: Secondary | ICD-10-CM | POA: Diagnosis not present

## 2016-07-29 DIAGNOSIS — D696 Thrombocytopenia, unspecified: Secondary | ICD-10-CM | POA: Diagnosis not present

## 2016-07-29 DIAGNOSIS — E119 Type 2 diabetes mellitus without complications: Secondary | ICD-10-CM | POA: Diagnosis not present

## 2016-07-29 DIAGNOSIS — Z0000 Encounter for general adult medical examination without abnormal findings: Secondary | ICD-10-CM | POA: Diagnosis not present

## 2016-07-29 DIAGNOSIS — E785 Hyperlipidemia, unspecified: Secondary | ICD-10-CM | POA: Diagnosis not present

## 2016-07-29 DIAGNOSIS — I10 Essential (primary) hypertension: Secondary | ICD-10-CM | POA: Diagnosis not present

## 2016-07-29 DIAGNOSIS — Z7689 Persons encountering health services in other specified circumstances: Secondary | ICD-10-CM | POA: Diagnosis not present

## 2016-07-29 DIAGNOSIS — Z9861 Coronary angioplasty status: Secondary | ICD-10-CM | POA: Diagnosis not present

## 2016-07-30 DIAGNOSIS — Z0000 Encounter for general adult medical examination without abnormal findings: Secondary | ICD-10-CM | POA: Diagnosis not present

## 2016-07-30 DIAGNOSIS — Z1389 Encounter for screening for other disorder: Secondary | ICD-10-CM | POA: Diagnosis not present

## 2016-07-30 DIAGNOSIS — E784 Other hyperlipidemia: Secondary | ICD-10-CM | POA: Diagnosis not present

## 2016-07-30 DIAGNOSIS — E119 Type 2 diabetes mellitus without complications: Secondary | ICD-10-CM | POA: Diagnosis not present

## 2016-07-30 DIAGNOSIS — I252 Old myocardial infarction: Secondary | ICD-10-CM | POA: Diagnosis not present

## 2016-07-30 DIAGNOSIS — Z9861 Coronary angioplasty status: Secondary | ICD-10-CM | POA: Diagnosis not present

## 2016-07-30 DIAGNOSIS — I10 Essential (primary) hypertension: Secondary | ICD-10-CM | POA: Diagnosis not present

## 2016-07-30 DIAGNOSIS — R972 Elevated prostate specific antigen [PSA]: Secondary | ICD-10-CM | POA: Diagnosis not present

## 2016-07-30 DIAGNOSIS — G5601 Carpal tunnel syndrome, right upper limb: Secondary | ICD-10-CM | POA: Diagnosis not present

## 2016-07-30 LAB — URINALYSIS, ROUTINE W REFLEX MICROSCOPIC
Bilirubin, UA: NEGATIVE
Glucose, UA: NEGATIVE
Ketones, UA: NEGATIVE
LEUKOCYTES UA: NEGATIVE
Nitrite, UA: NEGATIVE
PH UA: 7 (ref 5.0–7.5)
PROTEIN UA: NEGATIVE
RBC, UA: NEGATIVE
Specific Gravity, UA: <=1.005 — AB (ref 1.005–1.030)
Urobilinogen, Ur: 0.2 mg/dL (ref 0.2–1.0)

## 2016-07-30 LAB — COMPREHENSIVE METABOLIC PANEL
A/G RATIO: 1.6 (ref 1.2–2.2)
ALBUMIN: 3.9 g/dL (ref 3.6–4.8)
ALT: 31 IU/L (ref 0–44)
AST: 21 IU/L (ref 0–40)
Alkaline Phosphatase: 98 IU/L (ref 39–117)
BUN / CREAT RATIO: 13 (ref 10–24)
BUN: 10 mg/dL (ref 8–27)
Bilirubin Total: 0.4 mg/dL (ref 0.0–1.2)
CALCIUM: 8.7 mg/dL (ref 8.6–10.2)
CO2: 28 mmol/L (ref 18–29)
Chloride: 91 mmol/L — ABNORMAL LOW (ref 96–106)
Creatinine, Ser: 0.79 mg/dL (ref 0.76–1.27)
GFR, EST AFRICAN AMERICAN: 107 mL/min/{1.73_m2} (ref >59)
GFR, EST NON AFRICAN AMERICAN: 93 mL/min/{1.73_m2} (ref >59)
GLOBULIN, TOTAL: 2.5 g/dL (ref 1.5–4.5)
Glucose: 131 mg/dL — ABNORMAL HIGH (ref 65–99)
POTASSIUM: 4.5 mmol/L (ref 3.5–5.2)
SODIUM: 136 mmol/L (ref 134–144)
TOTAL PROTEIN: 6.4 g/dL (ref 6.0–8.5)

## 2016-07-30 LAB — CBC
HEMATOCRIT: 38.4 % (ref 37.5–51.0)
HEMOGLOBIN: 12.9 g/dL — AB (ref 13.0–17.7)
MCH: 27.7 pg (ref 26.6–33.0)
MCHC: 33.6 g/dL (ref 31.5–35.7)
MCV: 83 fL (ref 79–97)
PLATELETS: 155 10*3/uL (ref 150–379)
RBC: 4.65 x10E6/uL (ref 4.14–5.80)
RDW: 14.6 % (ref 12.3–15.4)
WBC: 7.7 10*3/uL (ref 3.4–10.8)

## 2016-07-30 LAB — LIPID PANEL W/O CHOL/HDL RATIO
Cholesterol, Total: 190 mg/dL (ref 100–199)
HDL: 55 mg/dL (ref >39)
LDL Calculated: 116 mg/dL — ABNORMAL HIGH (ref 0–99)
TRIGLYCERIDES: 93 mg/dL (ref 0–149)
VLDL Cholesterol Cal: 19 mg/dL (ref 5–40)

## 2016-07-30 LAB — HGB A1C W/O EAG: Hgb A1c MFr Bld: 8.2 % — ABNORMAL HIGH (ref 4.8–5.6)

## 2016-07-30 LAB — TSH: TSH: 1.1 u[IU]/mL (ref 0.450–4.500)

## 2016-07-30 MED FILL — AMOX TR-K CLV 875-125 MG TA: 875-125 | 14 days supply | Qty: 28 | Fill #0

## 2016-07-30 MED FILL — BYDUREON BCise 2 MG/0.85ML: 2 | 90 days supply | Qty: 10 | Fill #0

## 2016-07-31 ENCOUNTER — Other Ambulatory Visit: Payer: Self-pay | Admitting: Obstetrics

## 2016-08-02 LAB — MICROALBUMIN, URINE: Microalbumin, Urine: <3 ug/mL

## 2016-08-02 LAB — SPECIMEN STATUS REPORT

## 2016-08-06 ENCOUNTER — Ambulatory Visit (INDEPENDENT_AMBULATORY_CARE_PROVIDER_SITE_OTHER): Payer: 59 | Admitting: Interventional Cardiology

## 2016-08-06 ENCOUNTER — Encounter (INDEPENDENT_AMBULATORY_CARE_PROVIDER_SITE_OTHER): Payer: Self-pay

## 2016-08-06 ENCOUNTER — Telehealth: Payer: Self-pay | Admitting: Interventional Cardiology

## 2016-08-06 ENCOUNTER — Encounter: Payer: Self-pay | Admitting: Interventional Cardiology

## 2016-08-06 VITALS — BP 140/88 | HR 92 | Ht 65.0 in | Wt 233.0 lb

## 2016-08-06 DIAGNOSIS — E1100 Type 2 diabetes mellitus with hyperosmolarity without nonketotic hyperglycemic-hyperosmolar coma (NKHHC): Secondary | ICD-10-CM

## 2016-08-06 DIAGNOSIS — E784 Other hyperlipidemia: Secondary | ICD-10-CM | POA: Diagnosis not present

## 2016-08-06 DIAGNOSIS — I5042 Chronic combined systolic (congestive) and diastolic (congestive) heart failure: Secondary | ICD-10-CM

## 2016-08-06 DIAGNOSIS — I25118 Atherosclerotic heart disease of native coronary artery with other forms of angina pectoris: Secondary | ICD-10-CM | POA: Diagnosis not present

## 2016-08-06 DIAGNOSIS — I10 Essential (primary) hypertension: Secondary | ICD-10-CM | POA: Diagnosis not present

## 2016-08-06 DIAGNOSIS — I252 Old myocardial infarction: Secondary | ICD-10-CM

## 2016-08-06 DIAGNOSIS — E7849 Other hyperlipidemia: Secondary | ICD-10-CM

## 2016-08-06 DIAGNOSIS — R0683 Snoring: Secondary | ICD-10-CM

## 2016-08-06 DIAGNOSIS — F5101 Primary insomnia: Secondary | ICD-10-CM | POA: Diagnosis not present

## 2016-08-06 MED ORDER — ATORVASTATIN CALCIUM 20 MG PO TABS: 20.0000 mg | ORAL_TABLET | Freq: Every day | ORAL | 3 refills | Status: DC

## 2016-08-06 MED ORDER — ZOLPIDEM TARTRATE 5 MG PO TABS: 5.0000 mg | ORAL_TABLET | Freq: Every evening | ORAL | 0 refills | Status: DC | PRN

## 2016-08-06 MED FILL — ATORVASTATIN 20 MG TABLET: 20 | 90 days supply | Qty: 90 | Fill #0

## 2016-08-06 MED FILL — ZOLPIDEM TARTRATE 5 MG TAB: 5 | 30 days supply | Qty: 30 | Fill #0

## 2016-08-06 NOTE — Telephone Encounter (Signed)
Dr. Jodi Mourning is calling to see if he can be worked in around 1-2pm today,he scheduled to come in at 3:30 but has some time conflicts with his schedule. Thanks.

## 2016-08-06 NOTE — Telephone Encounter (Signed)
Spoke with [name], ok to move appt. Spoke with pt and he is agreeable to come at 1:30pm.  Pt appreciative for assistance.

## 2016-08-06 NOTE — Patient Instructions (Signed)
Medication Instructions:  1) START Atorvastatin 20mg  once daily. 2) START Ambien 5mg - one tablet at night.    Labwork: Your physician recommends that you return for lab work in: 2-3 months for Lipid and liver panel.    Testing/Procedures: Your physician has recommended that you have a sleep study. This test records several body functions during sleep, including: brain activity, eye movement, oxygen and carbon dioxide blood levels, heart rate and rhythm, breathing rate and rhythm, the flow of air through your mouth and nose, snoring, body muscle movements, and chest and belly movement.    Follow-Up: Your physician wants you to follow-up in: 1 year with [name].  You will receive a reminder letter in the mail two months in advance. If you don't receive a letter, please call our office to schedule the follow-up appointment.   Any Other Special Instructions Will Be Listed Below (If Applicable).     If you need a refill on your cardiac medications before your next appointment, please call your pharmacy.

## 2016-08-06 NOTE — Progress Notes (Signed)
Cardiology Office Note    Date:  08/06/2016   ID:  [name], MD, DOB Jan 19, 1951, MRN [id number]  PCP:  Marton Redwood, MD  Cardiologist: Sinclair Grooms, MD   Chief Complaint  Patient presents with  . 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  Simona Huh has concerns about potential for side effects/complications on statin therapy. Therefore, for the pain she year he has discontinued atorvastatin. As a result, the most recent LDL by his primary physician was 116. His target metric is 70 or less. He has no cardiac complaints. He has not exercising. A1c is greater than 8. He is unable to sleep soundly. He feels he needs help falling off to sleep.   Past Medical History:  Diagnosis Date  . 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 Coronary atherosclerosis of native coronary artery   . Dental crowns present   . Essential hypertension, benign    states under control with meds., has been on med. x 15 yr.  . MI (myocardial infarction) 10/27/2002  . Non-insulin dependent type 2 diabetes mellitus ([place])   . Obesity   . Osteoarthritis    hips and knees  . Pure hypercholesterolemia     Past Surgical History:  Procedure Laterality Date  . CARDIAC CATHETERIZATION  10/27/2002  . CARPAL TUNNEL RELEASE Right 11/21/2013  . CORONARY STENT PLACEMENT  10/27/2002   mid LAD  . TOTAL HIP ARTHROPLASTY Left 04/09/1999  . TOTAL HIP ARTHROPLASTY Right   . TOTAL KNEE ARTHROPLASTY Left 02/03/2011  . ULNAR TUNNEL RELEASE Left 06/18/2016   Procedure: LEFT CUBITAL TUNNEL RELEASE;  Surgeon: [name], MD;  Location: [facility];  Service: Orthopedics;  Laterality: Left;    Current  Medications: Outpatient Medications Prior to Visit  Medication Sig Dispense Refill  . amLODipine (NORVASC) 5 MG tablet TAKE 1 TABLET BY MOUTH ONCE DAILY 90 tablet 0  . amoxicillin-clavulanate (AUGMENTIN) 875-125 MG tablet Take 1 tablet by mouth 2 (two) times daily. 28 tablet 0  . carvedilol (COREG) 25 MG tablet TAKE 1 TABLET BY MOUTH TWICE A DAY WITH FOOD 180 tablet 3  . ciclopirox (LOPROX) 0.77 % cream Apply topically 2 (two) times daily. 90 g prn  . clopidogrel (PLAVIX) 75 MG tablet Take 75 mg by mouth daily.    . Exenatide ER (BYDUREON) 2 MG PEN Inject into the skin once a week.     . metFORMIN (GLUCOPHAGE) 1000 MG tablet Take 1,000 mg by mouth 2 (two) times daily with a meal.    . oxyCODONE-acetaminophen (ROXICET) 5-325 MG tablet Take 1 tablet by mouth every 4 (four) hours as needed for severe pain. 30 tablet 0  . ramipril (ALTACE) 10 MG capsule Take 10 mg by mouth 2 (two) times daily.     Marland Kitchen triamterene-hydrochlorothiazide (DYAZIDE) 37.5-25 MG capsule TAKE 1 CAPSULE BY MOUTH EVERY MORNING. 90 capsule 3   No facility-administered medications prior to visit.      Allergies:   Patient has no known allergies.   Social History   Social History  . Marital status: Married    Spouse name: N/A  . Number of children: N/A  . Years of education: N/A   Social History Main Topics  . Smoking  status: Never Smoker  . Smokeless tobacco: Never Used  . Alcohol use 0.0 oz/week     Comment: occasionally  . Drug use: No  . Sexual activity: Not Asked   Other Topics Concern  . None   Social History Narrative  . None     Family History:  The patient's family history includes Healthy in his father and mother.   ROS:   Please see the history of present illness.    Some difficulty urinating. Difficulty sleeping. Snoring has been identified by his wife.  All other systems reviewed and are negative.   PHYSICAL EXAM:   VS:  BP 140/88 (BP Location: Left Arm)   Pulse 92   Ht 5\' 5"  (1.651 m)    Wt 233 lb (105.7 kg)   BMI 38.77 kg/m    GEN: Well nourished, well developed, in no acute distress  HEENT: normal  Neck: no JVD, carotid bruits, or masses Cardiac: RRR; no murmurs, rubs. An S4 gallop present.,. There is moderate bilateral lower extremity pitting edema. This is not acute Respiratory:  clear to auscultation bilaterally, normal work of breathing GI: soft, nontender, nondistended, + BS MS: no deformity or atrophy  Skin: warm and dry, no rash Neuro:  Alert and Oriented x 3, Strength and sensation are intact Psych: euthymic mood, full affect  Wt Readings from Last 3 Encounters:  08/06/16 233 lb (105.7 kg)  06/18/16 227 lb (103 kg)  06/18/15 241 lb 6.4 oz (109.5 kg)      Studies/Labs Reviewed:   EKG:  EKG  Rhythm/sinus tachycardia with poor R-wave progression compatible with known prior history of anterior MI.  Recent Labs: 06/17/2016: BUN 8; Creatinine, Ser 1.10; Potassium 5.1; Sodium 138   Lipid Panel No results found for: CHOL, TRIG, HDL, CHOLHDL, VLDL, LDLCALC, LDLDIRECT  Additional studies/ records that were reviewed today include:  Echocardiogram March 2017: ------------------------------------------------------------------- Study Conclusions  - Left ventricle: The cavity size was normal. Wall thickness was   increased in a pattern of moderate LVH. There was focal basal   hypertrophy. Systolic function was mildly to moderately reduced.   The estimated ejection fraction was in the range of 40% to 45%.   Doppler parameters are consistent with abnormal left ventricular   relaxation (grade 1 diastolic dysfunction). - Right atrium: The atrium was mildly dilated.   ASSESSMENT:    1. Coronary artery disease involving native coronary artery of native heart with other form of angina pectoris ([place])   2. Chronic combined systolic and diastolic CHF (congestive heart failure) ([place])   3. Essential hypertension, benign   4. Old MI (myocardial infarction)   5.  Uncontrolled type 2 diabetes mellitus with hyperosmolarity without coma, without long-term current use of insulin ([place])   6. Other hyperlipidemia   7. Primary insomnia   8. Snoring      PLAN:  In order of problems listed above:  1. Stable without anginal complaints or ischemic equivalent. 2. Chronic combined systolic and diastolic heart failure. Volume overload the noted by lower extremity edema but clear lung fields and no evidence of JVD. 3. Above target. I do not believe further increase in medication should be recommended at this time. Decrease salt in diet, aerobic activity, weight loss or obvious needs. 4. Old anterior MI with decreased LV function as noted above. 5. We discussed the A1c which should be targeted less than 7. 6. LDL was 116 on blood work done recently by his primary physician. We will restart atorvastatin but  used 20 mg daily. Lab work should be done in 8-12 weeks. 7. A limited prescription of Ambien 5 mg is written. Total of 30 tablets. Needs to follow-up with his primary physician concerning sleep difficulty in the future. No further refills on this. 8. Because of obesity, regulation of snoring as observed by his wife, I recommended a sleep study to exclude sleep apnea. This could be having some impact on problem #7.    Medication Adjustments/Labs and Tests Ordered: Current medicines are reviewed at length with the patient today.  Concerns regarding medicines are outlined above.  Medication changes, Labs and Tests ordered today are listed in the Patient Instructions below. There are no Patient Instructions on file for this visit.   Signed, Sinclair Grooms, MD  08/06/2016 1:49 PM    [facility] [address], [place], [place]  [address] Phone: [phone]; Fax: [phone]

## 2016-09-13 ENCOUNTER — Other Ambulatory Visit: Payer: Self-pay | Admitting: Interventional Cardiology

## 2016-09-13 MED FILL — TRIAMTERENE/HCTZ 37.5/25 CP: 37.5-25 | 90 days supply | Qty: 90 | Fill #3

## 2016-09-14 MED FILL — CARVEDILOL 25 MG TABLET: 25 | 90 days supply | Qty: 180 | Fill #0

## 2016-09-23 ENCOUNTER — Encounter (HOSPITAL_BASED_OUTPATIENT_CLINIC_OR_DEPARTMENT_OTHER): Payer: Self-pay

## 2016-09-24 ENCOUNTER — Ambulatory Visit (HOSPITAL_BASED_OUTPATIENT_CLINIC_OR_DEPARTMENT_OTHER): Payer: 59 | Attending: Interventional Cardiology | Admitting: Cardiology

## 2016-09-24 VITALS — Ht 65.0 in | Wt 240.0 lb

## 2016-09-24 DIAGNOSIS — I509 Heart failure, unspecified: Secondary | ICD-10-CM | POA: Insufficient documentation

## 2016-09-24 DIAGNOSIS — I110 Hypertensive heart disease with heart failure: Secondary | ICD-10-CM | POA: Insufficient documentation

## 2016-09-24 DIAGNOSIS — I491 Atrial premature depolarization: Secondary | ICD-10-CM | POA: Insufficient documentation

## 2016-09-24 DIAGNOSIS — G4733 Obstructive sleep apnea (adult) (pediatric): Secondary | ICD-10-CM | POA: Insufficient documentation

## 2016-09-24 DIAGNOSIS — R0683 Snoring: Secondary | ICD-10-CM | POA: Diagnosis not present

## 2016-09-24 DIAGNOSIS — Z6841 Body Mass Index (BMI) 40.0 and over, adult: Secondary | ICD-10-CM | POA: Diagnosis not present

## 2016-09-24 DIAGNOSIS — E669 Obesity, unspecified: Secondary | ICD-10-CM | POA: Insufficient documentation

## 2016-09-24 DIAGNOSIS — G4736 Sleep related hypoventilation in conditions classified elsewhere: Secondary | ICD-10-CM | POA: Diagnosis not present

## 2016-09-24 DIAGNOSIS — E119 Type 2 diabetes mellitus without complications: Secondary | ICD-10-CM | POA: Insufficient documentation

## 2016-09-24 DIAGNOSIS — I493 Ventricular premature depolarization: Secondary | ICD-10-CM | POA: Diagnosis not present

## 2016-09-28 NOTE — Procedures (Signed)
   Patient Name: Ryan Walsh, Mannis Date: 09/24/2016 Gender: Male D.O.B: 04-14-1951 Age (years): 65 Referring Provider: Daneen Schick Height (inches): 31 Interpreting Physician: Fransico Him MD, ABSM Weight (lbs): 240 RPSGT: Baxter Flattery BMI: 40 MRN: [id number] Neck Size: 18.50  CLINICAL INFORMATION Sleep Study Type: NPSG  Indication for sleep study: Congestive Heart Failure, Diabetes, Hypertension, Obesity, Snoring, Witnessed Apneas  Epworth Sleepiness Score: 9  SLEEP STUDY TECHNIQUE As per the AASM Manual for the Scoring of Sleep and Associated Events v2.3 (April 2016) with a hypopnea requiring 4% desaturations.  The channels recorded and monitored were frontal, central and occipital EEG, electrooculogram (EOG), submentalis EMG (chin), nasal and oral airflow, thoracic and abdominal wall motion, anterior tibialis EMG, snore microphone, electrocardiogram, and pulse oximetry.  MEDICATIONS Medications self-administered by patient taken the night of the study : N/A  SLEEP ARCHITECTURE The study was initiated at 9:53:55 PM and ended at 4:16:49 AM.  Sleep onset time was 15.6 minutes and the sleep efficiency was 75.2%. The total sleep time was 287.8 minutes.  Stage REM latency was 71.5 minutes.  The patient spent 31.96% of the night in stage N1 sleep, 51.19% in stage N2 sleep, 0.00% in stage N3 and 16.85% in REM.  Alpha intrusion was absent.  Supine sleep was 49.51%.  RESPIRATORY PARAMETERS The overall apnea/hypopnea index (AHI) was 29.6 per hour. There were 15 total apneas, including 13 obstructive, 2 central and 0 mixed apneas. There were 127 hypopneas and 4 RERAs.  The AHI during Stage REM sleep was 30.9 per hour.  AHI while supine was 33.3 per hour.  The mean oxygen saturation was 90.86%. The minimum SpO2 during sleep was 63.00%.  Loud snoring was noted during this study.  CARDIAC DATA The 2 lead EKG demonstrated sinus rhythm. The mean heart rate was 81.65  beats per minute. Other EKG findings include: PVCs and PACS.  LEG MOVEMENT DATA The total PLMS were 0 with a resulting PLMS index of 0.00. Associated arousal with leg movement index was 0.0 .  IMPRESSIONS - Severe obstructive sleep apnea occurred during this study (AHI = 29.6/h). - No significant central sleep apnea occurred during this study (CAI = 0.4/h). - Severe oxygen desaturation was noted during this study (Min O2 = 63.00%). - The patient snored with Loud snoring volume. - PVCs and PACs were noted during this study. - Clinically significant periodic limb movements did not occur during sleep. No significant associated arousals.  DIAGNOSIS - Obstructive Sleep Apnea (327.23 [G47.33 ICD-10]) - Nocturnal Hypoxemia (327.26 [G47.36 ICD-10])  RECOMMENDATIONS - Therapeutic CPAP titration to determine optimal pressure required to alleviate sleep disordered breathing. - Avoid alcohol, sedatives and other CNS depressants that may worsen sleep apnea and disrupt normal sleep architecture. - Sleep hygiene should be reviewed to assess factors that may improve sleep quality. - Weight management and regular exercise should be initiated or continued if appropriate.   [place], American Board of Sleep Medicine  ELECTRONICALLY SIGNED ON:  09/28/2016, 7:02 AM [place] PH: [phone]   FX: [phone] [place]

## 2016-10-01 ENCOUNTER — Telehealth: Payer: Self-pay | Admitting: *Deleted

## 2016-10-01 DIAGNOSIS — G4733 Obstructive sleep apnea (adult) (pediatric): Secondary | ICD-10-CM

## 2016-10-01 NOTE — Telephone Encounter (Signed)
-----   Message from Sueanne Margarita, MD sent at 09/28/2016  7:05 AM EDT ----- Please let patient know that they have sleep apnea and recommend CPAP titration. Please set up titration in the sleep lab.

## 2016-10-01 NOTE — Telephone Encounter (Signed)
Called patient LMTCB. 

## 2016-10-06 NOTE — Telephone Encounter (Signed)
Informed patient of sleep study results and patient understanding was verbalized. Patient understands Dr Radford Pax has recommends a in lab CPAP titration.

## 2016-10-22 DIAGNOSIS — E119 Type 2 diabetes mellitus without complications: Secondary | ICD-10-CM | POA: Diagnosis not present

## 2016-10-22 DIAGNOSIS — H25013 Cortical age-related cataract, bilateral: Secondary | ICD-10-CM | POA: Diagnosis not present

## 2016-10-22 DIAGNOSIS — H40013 Open angle with borderline findings, low risk, bilateral: Secondary | ICD-10-CM | POA: Diagnosis not present

## 2016-10-22 DIAGNOSIS — H2513 Age-related nuclear cataract, bilateral: Secondary | ICD-10-CM | POA: Diagnosis not present

## 2016-10-22 DIAGNOSIS — H524 Presbyopia: Secondary | ICD-10-CM | POA: Diagnosis not present

## 2016-10-29 ENCOUNTER — Other Ambulatory Visit: Payer: Self-pay | Admitting: Interventional Cardiology

## 2016-10-29 MED FILL — METFORMIN HCL ER 500 MG TAB: 500 | 90 days supply | Qty: 360 | Fill #1

## 2016-10-29 MED FILL — ATORVASTATIN 20 MG TABLET: 20 | 90 days supply | Qty: 90 | Fill #1

## 2016-10-29 MED FILL — AMLODIPINE BESYLATE 5 MG TA: 5 | 90 days supply | Qty: 90 | Fill #0

## 2016-10-29 MED FILL — CLOPIDOGREL 75 MG TABLET: 75 | 90 days supply | Qty: 90 | Fill #0

## 2016-10-29 MED FILL — BYDUREON BCise 2 MG/0.85ML: 2 | 90 days supply | Qty: 10 | Fill #1

## 2016-10-29 MED FILL — RAMIPRIL 10 MG CAPSULE: 10 | 90 days supply | Qty: 180 | Fill #2

## 2016-11-12 ENCOUNTER — Encounter: Payer: Self-pay | Admitting: *Deleted

## 2016-11-12 NOTE — Telephone Encounter (Signed)
Informed patient of upcoming CPAP TITRATION study and patient understanding was verbalized. Patient understands his test is scheduled for Friday December 03 2016. Patient understands his sleep study will be done at [facility] sleep lab. Patient understands he will receive a sleep packet in a week or so. Patient understands to call if he does not receive the sleep packet in a timely manner. Patient agrees with treatment and thanked me for call

## 2016-12-03 ENCOUNTER — Ambulatory Visit (HOSPITAL_BASED_OUTPATIENT_CLINIC_OR_DEPARTMENT_OTHER): Payer: 59 | Attending: Cardiology | Admitting: Cardiology

## 2016-12-03 VITALS — Ht 66.0 in | Wt 241.0 lb

## 2016-12-03 DIAGNOSIS — G4736 Sleep related hypoventilation in conditions classified elsewhere: Secondary | ICD-10-CM | POA: Diagnosis not present

## 2016-12-03 DIAGNOSIS — G4733 Obstructive sleep apnea (adult) (pediatric): Secondary | ICD-10-CM | POA: Diagnosis not present

## 2016-12-09 NOTE — Procedures (Signed)
   Patient Name: Ryan Walsh, Ryan Walsh Date: 12/03/2016 Gender: Male D.O.B: 05/29/1950 Age (years): 65 Referring Provider: Daneen Schick Height (inches): 65 Interpreting Physician: Fransico Him MD, ABSM Weight (lbs): 240 RPSGT: Baxter Flattery BMI: 40 MRN: [id number] Neck Size: 18.50  CLINICAL INFORMATION The patient is referred for a CPAP titration to treat sleep apnea. Date of NPSG, Split Night or HST:09/24/2016  SLEEP STUDY TECHNIQUE As per the AASM Manual for the Scoring of Sleep and Associated Events v2.3 (April 2016) with a hypopnea requiring 4% desaturations.  The channels recorded and monitored were frontal, central and occipital EEG, electrooculogram (EOG), submentalis EMG (chin), nasal and oral airflow, thoracic and abdominal wall motion, anterior tibialis EMG, snore microphone, electrocardiogram, and pulse oximetry. Continuous positive airway pressure (CPAP) was initiated at the beginning of the study and titrated to treat sleep-disordered breathing.  MEDICATIONS Medications self-administered by patient taken the night of the study : N/A  TECHNICIAN COMMENTS Comments added by technician: Patient had difficulty initiating sleep.  Comments added by scorer: N/A  RESPIRATORY PARAMETERS Optimal PAP Pressure (cm): 11  AHI at Optimal Pressure (/hr):3.5 Overall Minimal O2 (%):79.00 Supine % at Optimal Pressure (%):26 Minimal O2 at Optimal Pressure (%): 85.0    SLEEP ARCHITECTURE The study was initiated at 11:02:00 PM and ended at 5:02:34 AM.  Sleep onset time was 6.7 minutes and the sleep efficiency was 82.6%. The total sleep time was 297.8 minutes.  The patient spent 11.58% of the night in stage N1 sleep, 54.84% in stage N2 sleep, 0.00% in stage N3 and 33.57% in REM.Stage REM latency was 38.5 minutes  Wake after sleep onset was 56.0. Alpha intrusion was absent. Supine sleep was 34.54%.  CARDIAC DATA The 2 lead EKG demonstrated sinus rhythm. The mean heart rate was  83.17 beats per minute. Other EKG findings include: None.  LEG MOVEMENT DATA The total Periodic Limb Movements of Sleep (PLMS) were 249. The PLMS index was 50.16. A PLMS index of <15 is considered normal in adults.  IMPRESSIONS - The optimal PAP pressure was 11 cm of water. - Central sleep apnea was not noted during this titration (CAI = 0.0/h). - Severe oxygen desaturations were observed during this titration (min O2 = 79.00%). - No snoring was audible during this study. - No cardiac abnormalities were observed during this study. 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 Arousals associated with PLMs were rare.  DIAGNOSIS - Obstructive Sleep Apnea (327.23 [G47.33 ICD-10]) - Nocturnal Hypoxemia  RECOMMENDATIONS - Trial of CPAP therapy on 11 cm H2O with a Medium size Fisher&Paykel Full Face Mask Simplus mask and heated humidification. - Avoid alcohol, sedatives and other CNS depressants that may worsen sleep apnea and disrupt normal sleep architecture. - Sleep hygiene should be reviewed to assess factors that may improve sleep quality. - Weight management and regular exercise should be initiated or continued. - Return to Sleep Center for re-evaluation after 10 weeks of therapy - Recommend overnight pulse ox on CPAP to determined in supplemental oxygen will be needed.  [place], American Board of Sleep Medicine  ELECTRONICALLY SIGNED ON:  12/09/2016, 10:05 PM [place] PH: [phone]   FX: [phone] [place]

## 2016-12-10 ENCOUNTER — Telehealth: Payer: Self-pay | Admitting: *Deleted

## 2016-12-10 DIAGNOSIS — G4734 Idiopathic sleep related nonobstructive alveolar hypoventilation: Secondary | ICD-10-CM

## 2016-12-10 NOTE — Progress Notes (Signed)
Let him know he has severe sleep apnea which likely contributes to his difficulty sleeping. Glad we have info and now can optimize therapy.

## 2016-12-10 NOTE — Telephone Encounter (Signed)
Informed patient of Titration study results and patient understanding was verbalized. Patient understands he will be set up with a CPAP unit. Patient understands an overnight pulse oximetry on CPAP for nocturnal hypoxemia will be done. Patient understands he will be contacted by [facility] to set up his CPAP and ONO. He understands to call if [facility] does not contact him with new setup in a timely manner. He understands he will be called once confirmation has been received from [facility] that he has received his new machine to schedule 10 week follow up appointment.  Stella notified of new cpap order in epic Please add to Maryfrances Bunnell He was grateful for the call and thanked me

## 2016-12-10 NOTE — Telephone Encounter (Signed)
-----   Message from Sueanne Margarita, MD sent at 12/09/2016 10:09 PM EDT ----- Please let patient know that they had successful CPAP titration and will be set up with CPAP unit.  Please let DME know that order is in EPIC.  Please set patient up for OV in 10 weeks.  Please get an overnight pulse oximetry on CPAP for nocturnal hypoxemia

## 2016-12-17 ENCOUNTER — Other Ambulatory Visit: Payer: Self-pay | Admitting: Interventional Cardiology

## 2016-12-17 MED FILL — CARVEDILOL 25 MG TABLET: 25 | 90 days supply | Qty: 180 | Fill #1

## 2016-12-17 MED FILL — TRIAMTERENE/HCTZ 37.5/25 CP: 37.5-25 | 90 days supply | Qty: 90 | Fill #0

## 2016-12-31 DIAGNOSIS — G4733 Obstructive sleep apnea (adult) (pediatric): Secondary | ICD-10-CM | POA: Diagnosis not present

## 2017-01-31 DIAGNOSIS — G4733 Obstructive sleep apnea (adult) (pediatric): Secondary | ICD-10-CM | POA: Diagnosis not present

## 2017-02-02 MED FILL — CLOPIDOGREL 75 MG TABLET: 75 | 90 days supply | Qty: 90 | Fill #1

## 2017-02-02 MED FILL — BYDUREON BCise 2 MG/0.85ML: 2 | 90 days supply | Qty: 10 | Fill #2

## 2017-02-02 MED FILL — AMLODIPINE BESYLATE 5 MG TA: 5 | 90 days supply | Qty: 90 | Fill #1

## 2017-02-02 MED FILL — ATORVASTATIN 20 MG TABLET: 20 | 90 days supply | Qty: 90 | Fill #2

## 2017-02-03 ENCOUNTER — Other Ambulatory Visit: Payer: PRIVATE HEALTH INSURANCE | Admitting: *Deleted

## 2017-02-03 DIAGNOSIS — Z23 Encounter for immunization: Secondary | ICD-10-CM

## 2017-02-03 MED FILL — SHINGRIX 50 MCG SUS: 50 | 1 days supply | Qty: 1 | Fill #0

## 2017-02-03 NOTE — Progress Notes (Addendum)
Pt received Shigrix injection today. Pt tolerated well.  Pt supplied vaccine for today's visit.  Pt made aware next injection due in December 2018.  Immunization History  Administered Date(s) Administered  . Tdap 05/09/2013  . Zoster Recombinat (Shingrix) 02/03/2017

## 2017-02-03 NOTE — Addendum Note (Signed)
Addended by: Lewie Loron D on: 02/03/2017 08:49 AM   Modules accepted: Level of Service

## 2017-02-04 MED FILL — RAMIPRIL 10 MG CAPSULE: 10 | 90 days supply | Qty: 180 | Fill #3

## 2017-02-11 MED FILL — METFORMIN HCL ER 500 MG TAB: 500 | 90 days supply | Qty: 360 | Fill #0

## 2017-02-16 NOTE — Telephone Encounter (Addendum)
Called the patient on February 17 2017 to schedule his 10 week sleep follow up appointment and was informed by the patient that he had not begun to use his CPAP machine yet but that he would start on October 7.   Patient has a compliance range of 02/01/17-04/02/17.  Patient promised to start wearing his machine on 9/8 but when I reached out to him he stated he did not start as of yet.

## 2017-03-02 DIAGNOSIS — G4733 Obstructive sleep apnea (adult) (pediatric): Secondary | ICD-10-CM | POA: Diagnosis not present

## 2017-03-04 NOTE — Telephone Encounter (Signed)
Reached out to the patient to schedule his 10 week compliance appointment but the patient still has not started his CPAP therapy yet. Patient states he does not have power now but he will start next week 03/07/17 after his power is restored.

## 2017-03-31 MED FILL — TRIAMTERENE/HCTZ 37.5/25 CP: 37.5-25 | 90 days supply | Qty: 90 | Fill #1

## 2017-03-31 MED FILL — CARVEDILOL 25 MG TABLET: 25 | 90 days supply | Qty: 180 | Fill #2

## 2017-04-02 DIAGNOSIS — G4733 Obstructive sleep apnea (adult) (pediatric): Secondary | ICD-10-CM | POA: Diagnosis not present

## 2017-04-25 MED FILL — SHINGRIX 50 MCG SUS: 50 | 1 days supply | Qty: 1 | Fill #1

## 2017-04-26 MED FILL — CLOPIDOGREL 75 MG TABLET: 75 | 90 days supply | Qty: 90 | Fill #2

## 2017-04-26 MED FILL — RAMIPRIL 10 MG CAPSULE: 10 | 90 days supply | Qty: 180 | Fill #0

## 2017-04-26 MED FILL — AMLODIPINE BESYLATE 5 MG TA: 5 | 90 days supply | Qty: 90 | Fill #2

## 2017-04-27 DIAGNOSIS — E119 Type 2 diabetes mellitus without complications: Secondary | ICD-10-CM | POA: Diagnosis not present

## 2017-04-27 DIAGNOSIS — Z0000 Encounter for general adult medical examination without abnormal findings: Secondary | ICD-10-CM | POA: Diagnosis not present

## 2017-04-27 DIAGNOSIS — Z9861 Coronary angioplasty status: Secondary | ICD-10-CM | POA: Diagnosis not present

## 2017-04-27 DIAGNOSIS — I10 Essential (primary) hypertension: Secondary | ICD-10-CM | POA: Diagnosis not present

## 2017-04-28 DIAGNOSIS — I10 Essential (primary) hypertension: Secondary | ICD-10-CM | POA: Diagnosis not present

## 2017-04-28 DIAGNOSIS — D696 Thrombocytopenia, unspecified: Secondary | ICD-10-CM | POA: Diagnosis not present

## 2017-04-28 DIAGNOSIS — Z6838 Body mass index (BMI) 38.0-38.9, adult: Secondary | ICD-10-CM | POA: Diagnosis not present

## 2017-04-28 DIAGNOSIS — E1165 Type 2 diabetes mellitus with hyperglycemia: Secondary | ICD-10-CM | POA: Diagnosis not present

## 2017-04-28 DIAGNOSIS — E7849 Other hyperlipidemia: Secondary | ICD-10-CM | POA: Diagnosis not present

## 2017-04-28 DIAGNOSIS — Z9861 Coronary angioplasty status: Secondary | ICD-10-CM | POA: Diagnosis not present

## 2017-04-28 MED FILL — JARDIANCE 25 MG TABLET: 25 | 90 days supply | Qty: 90 | Fill #0

## 2017-04-28 MED FILL — ATORVASTATIN 40 MG TABLET: 40 | 90 days supply | Qty: 90 | Fill #0

## 2017-04-28 MED FILL — TRULICITY 1.5 MG/0.5 ML PEN: 1.5 | 84 days supply | Qty: 6 | Fill #0

## 2017-05-03 MED FILL — METFORMIN HCL ER 500 MG TAB: 500 | 90 days supply | Qty: 360 | Fill #1

## 2017-05-06 ENCOUNTER — Ambulatory Visit: Payer: 59 | Admitting: Podiatry

## 2017-05-06 ENCOUNTER — Encounter: Payer: Self-pay | Admitting: Podiatry

## 2017-05-06 VITALS — BP 105/68 | HR 94 | Resp 16

## 2017-05-06 DIAGNOSIS — M76829 Posterior tibial tendinitis, unspecified leg: Secondary | ICD-10-CM | POA: Diagnosis not present

## 2017-05-06 DIAGNOSIS — M7742 Metatarsalgia, left foot: Secondary | ICD-10-CM | POA: Diagnosis not present

## 2017-05-06 DIAGNOSIS — M7741 Metatarsalgia, right foot: Secondary | ICD-10-CM

## 2017-05-06 DIAGNOSIS — B351 Tinea unguium: Secondary | ICD-10-CM | POA: Diagnosis not present

## 2017-05-06 DIAGNOSIS — E119 Type 2 diabetes mellitus without complications: Secondary | ICD-10-CM | POA: Diagnosis not present

## 2017-05-06 DIAGNOSIS — E1151 Type 2 diabetes mellitus with diabetic peripheral angiopathy without gangrene: Secondary | ICD-10-CM | POA: Diagnosis not present

## 2017-05-06 DIAGNOSIS — L601 Onycholysis: Secondary | ICD-10-CM | POA: Diagnosis not present

## 2017-05-06 DIAGNOSIS — M779 Enthesopathy, unspecified: Secondary | ICD-10-CM | POA: Diagnosis not present

## 2017-05-06 NOTE — Progress Notes (Signed)
Subjective:  Patient ID: [name], MD, male    DOB: [birth year],  MRN: [id number]  Chief Complaint  Patient presents with  . Diabetes    Diabetic Foot Exam - concerned about callused, dry, cracked skin, nails are thick and dark   66 y.o. male presents with the above complaint.  Complains of callus dry skin to the bottom of both feet.  States he also experiences pain in the ball of his foot worst at the end of the day.  Aching in nature.  Past Medical History:  Diagnosis Date  . Chronic combined systolic and diastolic CHF (congestive heart failure) ([place])   . Coronary atherosclerosis of native coronary artery   . Dental crowns present   . Essential hypertension, benign    states under control with meds., has been on med. x 15 yr.  . MI (myocardial infarction) ([place]) 10/27/2002  . Non-insulin dependent type 2 diabetes mellitus ([place])   . Obesity   . Osteoarthritis    hips and knees  . Pure hypercholesterolemia    Past Surgical History:  Procedure Laterality Date  . CARDIAC CATHETERIZATION  10/27/2002  . CARPAL TUNNEL RELEASE Right 11/21/2013  . CORONARY STENT PLACEMENT  10/27/2002   mid LAD  . TOTAL HIP ARTHROPLASTY Left 04/09/1999  . TOTAL HIP ARTHROPLASTY Right   . TOTAL KNEE ARTHROPLASTY Left 02/03/2011  . ULNAR TUNNEL RELEASE Left 06/18/2016   Procedure: LEFT CUBITAL TUNNEL RELEASE;  Surgeon: [name], MD;  Location: [facility];  Service: Orthopedics;  Laterality: Left;    Current Outpatient Medications:  .  amLODipine (NORVASC) 5 MG tablet, TAKE 1 TABLET BY MOUTH ONCE DAILY, Disp: 90 tablet, Rfl: 3 .  atorvastatin (LIPITOR) 20 MG tablet, Take 1 tablet (20 mg total) by mouth daily., Disp: 90 tablet, Rfl: 3 .  atorvastatin (LIPITOR) 20 MG tablet, , Disp: , Rfl: 3 .  carvedilol (COREG) 25 MG tablet, TAKE 1 TABLET BY MOUTH TWICE DAILY WITH FOOD, Disp: 180 tablet, Rfl: 3 .  ciclopirox (LOPROX) 0.77 % cream, Apply topically 2 (two) times daily., Disp:  90 g, Rfl: prn .  clopidogrel (PLAVIX) 75 MG tablet, Take 75 mg by mouth daily., Disp: , Rfl:  .  JARDIANCE 25 MG TABS tablet, , Disp: , Rfl: 3 .  metFORMIN (GLUCOPHAGE) 1000 MG tablet, Take 1,000 mg by mouth 2 (two) times daily with a meal., Disp: , Rfl:  .  ramipril (ALTACE) 10 MG capsule, Take 10 mg by mouth 2 (two) times daily. , Disp: , Rfl:  .  SHINGRIX injection, , Disp: , Rfl: 1 .  triamterene-hydrochlorothiazide (DYAZIDE) 37.5-25 MG capsule, TAKE 1 CAPSULE BY MOUTH EVERY MORNING., Disp: 90 capsule, Rfl: 2 .  TRULICITY 1.5 AT/5.5DD SOPN, , Disp: , Rfl: 3  No Known Allergies   Review of Systems  All other systems reviewed and are negative.   Objective:   Vitals:   05/06/17 1443  BP: 105/68  Pulse: 94  Resp: 16   General AA&O x3. Normal mood and affect.  Vascular Dorsalis pedis pulses present 1+ bilaterally  Posterior tibial pulses absent bilaterally  Capillary refill normal to all digits. Pedal hair growth diminished.  Neurologic Epicritic sensation present bilaterally. Protective sensation with 5.07 monofilament  present bilaterally. Vibratory sensation present bilaterally.  Dermatologic No open lesions. Interspaces clear of maceration.  Normal skin temperature and turgor. Hyperkeratotic lesions: none bilaterally. Nails: Elongated and thickened with dystrophic changes, subungual debris  Xerosis to the plantar foot bilateral  Orthopedic: No history of amputation. MMT 5/5 in dorsiflexion, plantarflexion, inversion, and eversion. Flattened arch bilaterally with pain to palpation of the forefoot Normal lower extremity joint ROM without pain or crepitus.   Assessment & Plan:  Patient was evaluated and treated and all questions answered.  Diabetes with PAD, Onychomycosis -Diabetic risk level 1 -At risk foot care provided as below. -Educated on proper moisturizing the foot to treat xerosis -Culture taken and sent for fungal culture  Metatarsalgia , PTTD -Will  fabricate custom molded orthotics.  Will aggressively pad the forefoot  Procedure: Nail Debridement Rationale: Patient meets criteria for routine foot care due to class B findings Type of Debridement: manual, sharp debridement. Instrumentation: Nail nipper, rotary burr. Number of Nails: 10  Return in about 3 months (around 08/04/2017) for Diabetic Foot Care.

## 2017-06-10 ENCOUNTER — Encounter: Payer: Self-pay | Admitting: Podiatry

## 2017-06-10 ENCOUNTER — Ambulatory Visit: Payer: 59 | Admitting: Podiatry

## 2017-06-10 DIAGNOSIS — M7742 Metatarsalgia, left foot: Secondary | ICD-10-CM

## 2017-06-10 DIAGNOSIS — E1151 Type 2 diabetes mellitus with diabetic peripheral angiopathy without gangrene: Secondary | ICD-10-CM

## 2017-06-10 DIAGNOSIS — B351 Tinea unguium: Secondary | ICD-10-CM

## 2017-06-10 DIAGNOSIS — M7741 Metatarsalgia, right foot: Secondary | ICD-10-CM

## 2017-06-10 DIAGNOSIS — Z79899 Other long term (current) drug therapy: Secondary | ICD-10-CM

## 2017-06-10 DIAGNOSIS — M76829 Posterior tibial tendinitis, unspecified leg: Secondary | ICD-10-CM

## 2017-06-10 MED ORDER — TERBINAFINE HCL 250 MG PO TABS: 250.0000 mg | ORAL_TABLET | Freq: Every day | ORAL | 0 refills | Status: DC

## 2017-06-10 MED FILL — TERBINAFINE HCL 250 MG TABS: 250 | 90 days supply | Qty: 90 | Fill #0

## 2017-06-12 NOTE — Progress Notes (Signed)
  Subjective:  Patient ID: Ryan Bombard, MD, male    DOB: Mar 14, 1951,  MRN: [id number]  Chief Complaint  Patient presents with  . Callouses    trim   67 y.o. male returns for the above complaint.  Here for continued care of calluses.  Also here to pick up orthotics and discuss results of nail culture.  Objective:  There were no vitals filed for this visit. General AA&O x3. Normal mood and affect.  Vascular Pedal pulses palpable.  Neurologic Epicritic sensation grossly intact.  Dermatologic No open lesions. Skin normal texture and turgor. Xerosis bilaterally with some areas of hyperkeratotic buildup  Orthopedic: No pain to palpation either foot.   Assessment & Plan:  Patient was evaluated and treated and all questions answered.  Onychomycosis -Discussed results of nail culture including possible oral versus topical therapy.  Discussed weekly fluconazole versus daily terbinafine.  Patient elects to go with terbinafine.  Advised low risk of liver injury.  LFTs ordered today.  Will recheck at next visit.  Rx sent to pharmacy  Pes Planus, Metatarsalgia -Custom molded orthotics dispensed today educated on proper wearing.  Advised of break in period.  Callosities -Recommend urea cream for calluses.  15 minutes of face to face time were spent with the patient. >50% of this was spent on counseling and coordination of care. Specifically discussed with patient the above diagnoses and treatment plans.   Return in about 9 weeks (around 08/12/2017) for Diabetic Foot Care, Nail Fungus.

## 2017-07-06 MED FILL — TRIAMTERENE/HCTZ 37.5/25 CP: 37.5-25 | 90 days supply | Qty: 90 | Fill #2

## 2017-07-06 MED FILL — CARVEDILOL 25 MG TABS: 25 | 90 days supply | Qty: 180 | Fill #3

## 2017-07-07 ENCOUNTER — Other Ambulatory Visit: Payer: Self-pay | Admitting: Certified Nurse Midwife

## 2017-07-07 DIAGNOSIS — T753XXS Motion sickness, sequela: Secondary | ICD-10-CM

## 2017-07-07 MED ORDER — SCOPOLAMINE 1 MG/3DAYS TD PT72: 1.0000 | MEDICATED_PATCH | TRANSDERMAL | 2 refills | Status: DC

## 2017-07-28 MED FILL — RAMIPRIL 10 MG CAPSULE: 10 | 90 days supply | Qty: 180 | Fill #1

## 2017-07-28 MED FILL — JARDIANCE 25 MG TABLET: 25 | 90 days supply | Qty: 90 | Fill #1

## 2017-07-28 MED FILL — CLOPIDOGREL 75 MG TABLET: 75 | 90 days supply | Qty: 90 | Fill #3

## 2017-07-28 MED FILL — AMLODIPINE BESYLATE 5 MG TA: 5 | 90 days supply | Qty: 90 | Fill #3

## 2017-07-28 MED FILL — TRULICITY 1.5 MG/0.5 ML PEN: 1.5 | 84 days supply | Qty: 6 | Fill #1

## 2017-07-28 MED FILL — METFORMIN HCL ER 500 MG TAB: 500 | 90 days supply | Qty: 360 | Fill #2

## 2017-07-28 MED FILL — ATORVASTATIN 40 MG TABLET: 40 | 90 days supply | Qty: 90 | Fill #1

## 2017-08-05 ENCOUNTER — Ambulatory Visit: Payer: 59 | Admitting: Podiatry

## 2017-08-11 NOTE — Telephone Encounter (Signed)
Return call: Patient returned my call to understand why he had to have 1 sleep study and a titration done. I explained to patient that the sleep study showed that he had sleep apnea then the titration was to treat the sleep apnea. An optimal pressure was reached at 11 cm to treat patients 29.6 times an hour that he quit breathing. I also went over the patients compliance report with him and encoraged him to improve on his compliance of only using his machine 9/30 days and only 8 days of usage at 4 or more hours.. Patient verbalized understanding and agrees with treatment.Marland Kitchen

## 2017-08-11 NOTE — Telephone Encounter (Signed)
-----   Message -----  From: Delma Officer  Sent: 08/11/2017  8:52 AM  To: Belva Crome, MD, Sueanne Margarita, MD  Subject: Sleep study                    I received a phone call from Dr Jodi Mourning regarding his sleep studies. He was questioning why he was billed for two. I tried to explain that the initial done in May 2018 was to diagnose and the 2nd in July 2018 was for CPAP titration. He really didn't understand why 2 were needed and I am limited in being able to explain. I'll be happy to call him back with an explanation if one of you can help me to understand. If one of you would call him to advise him why two were necessary, it would be more helpful. Please let me know how.   Thank you,  [facility]

## 2017-08-11 NOTE — Telephone Encounter (Signed)
Called patient to address his concerns about his sleep study. LMTCB with office hours and office phone numbers to reach pap assistant.

## 2017-08-12 ENCOUNTER — Ambulatory Visit: Payer: 59 | Admitting: Podiatry

## 2017-08-12 DIAGNOSIS — E1151 Type 2 diabetes mellitus with diabetic peripheral angiopathy without gangrene: Secondary | ICD-10-CM | POA: Diagnosis not present

## 2017-08-12 DIAGNOSIS — B351 Tinea unguium: Secondary | ICD-10-CM | POA: Diagnosis not present

## 2017-08-12 DIAGNOSIS — M76829 Posterior tibial tendinitis, unspecified leg: Secondary | ICD-10-CM

## 2017-08-12 DIAGNOSIS — M7742 Metatarsalgia, left foot: Secondary | ICD-10-CM

## 2017-08-12 DIAGNOSIS — M779 Enthesopathy, unspecified: Secondary | ICD-10-CM

## 2017-08-12 DIAGNOSIS — M7741 Metatarsalgia, right foot: Secondary | ICD-10-CM | POA: Diagnosis not present

## 2017-08-16 NOTE — Progress Notes (Signed)
  Subjective:  Patient ID: [name], MD, male    DOB: 1950-07-07,  MRN: [id number]  Chief Complaint  Patient presents with  . Nail Problem    3 month debride   67 y.o. male returns for the above complaint.  Here for routine foot care.  Denies new issues with his diabetes.  States that he is happy with his orthotics and would like a new pair.  Objective:   General AA&O x3. Normal mood and affect.  Vascular Dorsalis pedis pulses present 1+ bilaterally  Posterior tibial pulses absent bilaterally  Capillary refill normal to all digits. Pedal hair growth diminished.  Neurologic Epicritic sensation present bilaterally. Protective sensation with 5.07 monofilament  present bilaterally. Vibratory sensation present bilaterally.  Dermatologic No open lesions. Interspaces clear of maceration.  Normal skin temperature and turgor. Hyperkeratotic lesions: none bilaterally. Nails: Elongated and thickened with dystrophic changes, subungual debris  Xerosis to the plantar foot bilateral  Orthopedic: No history of amputation. MMT 5/5 in dorsiflexion, plantarflexion, inversion, and eversion. Flattened arch bilaterally with pain to palpation of the forefoot Normal lower extremity joint ROM without pain or crepitus.   Assessment & Plan:  Patient was evaluated and treated and all questions answered.  Diabetes with PAD , Onychomycosis -Educated on diabetic footcare. Diabetic risk level  1 -Nails x10 debrided sharply and manually with large nail nipper and rotary burr.  Procedure: Nail Debridement Rationale: Patient meets criteria for routine foot care due to Class B findings. Type of Debridement: manual, sharp debridement. Instrumentation: Nail nipper, rotary burr. Number of Nails: 10  Pes Planus with Metatarsalgia -Will fabricate CMOS. Patient pleased with the orthotics he has would like same style.  Return in about 3 months (around 11/12/2017).

## 2017-09-19 ENCOUNTER — Other Ambulatory Visit: Payer: Self-pay | Admitting: Certified Nurse Midwife

## 2017-09-19 DIAGNOSIS — R21 Rash and other nonspecific skin eruption: Secondary | ICD-10-CM

## 2017-09-19 MED ORDER — CLOBETASOL PROPIONATE 0.05 % EX OINT: 1.0000 "application " | TOPICAL_OINTMENT | Freq: Two times a day (BID) | CUTANEOUS | 0 refills | Status: DC

## 2017-09-30 DIAGNOSIS — R972 Elevated prostate specific antigen [PSA]: Secondary | ICD-10-CM | POA: Diagnosis not present

## 2017-10-06 DIAGNOSIS — E785 Hyperlipidemia, unspecified: Secondary | ICD-10-CM | POA: Diagnosis not present

## 2017-10-06 DIAGNOSIS — E1165 Type 2 diabetes mellitus with hyperglycemia: Secondary | ICD-10-CM | POA: Diagnosis not present

## 2017-10-07 DIAGNOSIS — E119 Type 2 diabetes mellitus without complications: Secondary | ICD-10-CM | POA: Diagnosis not present

## 2017-10-07 DIAGNOSIS — I252 Old myocardial infarction: Secondary | ICD-10-CM | POA: Diagnosis not present

## 2017-10-07 DIAGNOSIS — Z0000 Encounter for general adult medical examination without abnormal findings: Secondary | ICD-10-CM | POA: Diagnosis not present

## 2017-10-07 DIAGNOSIS — I10 Essential (primary) hypertension: Secondary | ICD-10-CM | POA: Diagnosis not present

## 2017-10-07 DIAGNOSIS — N401 Enlarged prostate with lower urinary tract symptoms: Secondary | ICD-10-CM | POA: Diagnosis not present

## 2017-10-07 DIAGNOSIS — N5201 Erectile dysfunction due to arterial insufficiency: Secondary | ICD-10-CM | POA: Diagnosis not present

## 2017-10-07 DIAGNOSIS — E7849 Other hyperlipidemia: Secondary | ICD-10-CM | POA: Diagnosis not present

## 2017-10-07 DIAGNOSIS — R350 Frequency of micturition: Secondary | ICD-10-CM | POA: Diagnosis not present

## 2017-10-07 DIAGNOSIS — Z9861 Coronary angioplasty status: Secondary | ICD-10-CM | POA: Diagnosis not present

## 2017-10-07 DIAGNOSIS — R972 Elevated prostate specific antigen [PSA]: Secondary | ICD-10-CM | POA: Diagnosis not present

## 2017-10-07 DIAGNOSIS — D696 Thrombocytopenia, unspecified: Secondary | ICD-10-CM | POA: Diagnosis not present

## 2017-10-10 ENCOUNTER — Other Ambulatory Visit: Payer: Self-pay | Admitting: Interventional Cardiology

## 2017-10-10 MED FILL — TRIAMTERENE/HCTZ 37.5/25 CP: 37.5-25 | 30 days supply | Qty: 30 | Fill #0

## 2017-10-10 MED FILL — CARVEDILOL 25 MG TABS: 25 | 30 days supply | Qty: 60 | Fill #0

## 2017-10-12 DIAGNOSIS — G4733 Obstructive sleep apnea (adult) (pediatric): Secondary | ICD-10-CM | POA: Diagnosis not present

## 2017-10-21 ENCOUNTER — Ambulatory Visit: Payer: 59 | Admitting: Interventional Cardiology

## 2017-10-21 ENCOUNTER — Encounter (INDEPENDENT_AMBULATORY_CARE_PROVIDER_SITE_OTHER): Payer: Self-pay

## 2017-10-21 ENCOUNTER — Encounter: Payer: Self-pay | Admitting: Interventional Cardiology

## 2017-10-21 VITALS — BP 134/86 | HR 89 | Ht 65.0 in | Wt 225.0 lb

## 2017-10-21 DIAGNOSIS — I5042 Chronic combined systolic (congestive) and diastolic (congestive) heart failure: Secondary | ICD-10-CM

## 2017-10-21 DIAGNOSIS — I25118 Atherosclerotic heart disease of native coronary artery with other forms of angina pectoris: Secondary | ICD-10-CM | POA: Diagnosis not present

## 2017-10-21 DIAGNOSIS — E7849 Other hyperlipidemia: Secondary | ICD-10-CM | POA: Diagnosis not present

## 2017-10-21 DIAGNOSIS — I10 Essential (primary) hypertension: Secondary | ICD-10-CM | POA: Diagnosis not present

## 2017-10-21 NOTE — Progress Notes (Signed)
Cardiology Office Note    Date:  10/21/2017   ID:  Ryan Bombard, MD, DOB [birth year], MRN [id number]  PCP:  Marton Redwood, MD  Cardiologist: Sinclair Grooms, MD   Chief Complaint  Patient presents with  . Coronary Artery Disease  . Congestive Heart Failure    Systolic dysfunction  . 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  Ryan Walsh is still disappointed he is unable to be more physically active.  He has held back by exertional joint discomfort, long work hours, and decreased interest.  He denies angina.  No episodes of syncope, nitroglycerin use, palpitations, orthopnea, PND, but does have chronic lower extremity swelling that fluctuates in severity dependent upon how long he is on his feet.  Past Medical History:  Diagnosis Date  . Chronic combined systolic and diastolic CHF (congestive heart failure) ([facility])   . Coronary atherosclerosis of native coronary artery   . Dental crowns present   . Essential hypertension, benign    states under control with meds., has been on med. x 15 yr.  . MI (myocardial infarction) ([place]) 10/27/2002  . Non-insulin dependent type 2 diabetes mellitus ([place])   . Obesity   . Osteoarthritis    hips and knees  . Pure hypercholesterolemia     Past Surgical History:  Procedure Laterality Date  . CARDIAC CATHETERIZATION  10/27/2002  . CARPAL TUNNEL RELEASE Right 11/21/2013  . CORONARY STENT PLACEMENT  10/27/2002   mid LAD  . TOTAL HIP ARTHROPLASTY Left 04/09/1999  . TOTAL HIP ARTHROPLASTY Right   . TOTAL KNEE ARTHROPLASTY Left 02/03/2011  . ULNAR TUNNEL RELEASE Left 06/18/2016   Procedure: LEFT CUBITAL TUNNEL RELEASE;  Surgeon: [name], MD;  Location: [facility];  Service: Orthopedics;   Laterality: Left;    Current Medications: Outpatient Medications Prior to Visit  Medication Sig Dispense Refill  . amLODipine (NORVASC) 5 MG tablet TAKE 1 TABLET BY MOUTH ONCE DAILY 90 tablet 3  . carvedilol (COREG) 25 MG tablet TAKE 1 TABLET BY MOUTH TWICE DAILY WITH FOOD 60 tablet 0  . ciclopirox (LOPROX) 0.77 % cream Apply topically 2 (two) times daily. 90 g prn  . clobetasol ointment (TEMOVATE) 3.81 % Apply 1 application topically 2 (two) times daily. 30 g 0  . clopidogrel (PLAVIX) 75 MG tablet Take 75 mg by mouth daily.    Marland Kitchen JARDIANCE 25 MG TABS tablet Take 25 mg by mouth daily.   3  . metFORMIN (GLUCOPHAGE) 1000 MG tablet Take 1,000 mg by mouth 2 (two) times daily with a meal.    . ramipril (ALTACE) 10 MG capsule Take 10 mg by mouth 2 (two) times daily.     Marland Kitchen scopolamine (TRANSDERM-SCOP, 1.5 MG,) 1 MG/3DAYS Place 1 patch (1.5 mg total) onto the skin every 3 (three) days. 10 patch 2  . terbinafine (LAMISIL) 250 MG tablet Take 1 tablet (250 mg total) by mouth daily. 90 tablet 0  . triamterene-hydrochlorothiazide (DYAZIDE) 37.5-25 MG capsule TAKE 1 CAPSULE BY MOUTH EVERY MORNING 30 capsule 0  . TRULICITY 1.5 OF/7.5ZW SOPN Inject 1.5 mg into the skin once a week.   3  . atorvastatin (LIPITOR) 20 MG tablet Take 1 tablet (20 mg total) by mouth daily. 90 tablet 3  . atorvastatin (LIPITOR) 20 MG  tablet   3  . SHINGRIX injection   1   No facility-administered medications prior to visit.      Allergies:   Patient has no known allergies.   Social History   Socioeconomic History  . Marital status: Married    Spouse name: Not on file  . Number of children: Not on file  . Years of education: Not on file  . Highest education level: Not on file  Occupational History  . Not on file  Social Needs  . Financial resource strain: Not on file  . Food insecurity:    Worry: Not on file    Inability: Not on file  . Transportation needs:    Medical: Not on file    Non-medical: Not on file    Tobacco Use  . Smoking status: Never Smoker  . Smokeless tobacco: Never Used  Substance and Sexual Activity  . Alcohol use: Yes    Alcohol/week: 0.0 oz    Comment: occasionally  . Drug use: No  . Sexual activity: Not on file  Lifestyle  . Physical activity:    Days per week: Not on file    Minutes per session: Not on file  . Stress: Not on file  Relationships  . Social connections:    Talks on phone: Not on file    Gets together: Not on file    Attends religious service: Not on file    Active member of club or organization: Not on file    Attends meetings of clubs or organizations: Not on file    Relationship status: Not on file  Other Topics Concern  . Not on file  Social History Narrative  . Not on file     Family History:  The patient's family history includes Healthy in his father and mother.   ROS:   Please see the history of present illness.    Joint swelling, difficulty with balance, and inability to lose weight.  Sleeping is markedly improved since starting to wear CPAP. All other systems reviewed and are negative.   PHYSICAL EXAM:   VS:  BP 134/86   Pulse 89   Ht 5\' 5"  (1.651 m)   Wt 225 lb (102.1 kg)   BMI 37.44 kg/m    GEN: Well nourished, well developed, in no acute distress  HEENT: normal  Neck: no JVD, carotid bruits, or masses Cardiac: RRR; no murmurs, rubs, or gallops,no edema  Respiratory:  clear to auscultation bilaterally, normal work of breathing GI: soft, nontender, nondistended, + BS MS: no deformity or atrophy  Skin: warm and dry, no rash Neuro:  Alert and Oriented x 3, Strength and sensation are intact Psych: euthymic mood, full affect  Wt Readings from Last 3 Encounters:  10/21/17 225 lb (102.1 kg)  12/03/16 241 lb (109.3 kg)  09/24/16 240 lb (108.9 kg)      Studies/Labs Reviewed:   EKG:  EKG sinus rhythm, left atrial abnormality, inferior Q waves, anteroseptal infarction with QS pattern V1 through V4.  When compared to prior  tracings,  Recent Labs: No results found for requested labs within last 8760 hours.   Lipid Panel    Component Value Date/Time   CHOL 190 07/29/2016 0000   TRIG 93 07/29/2016 0000   HDL 55 07/29/2016 0000   LDLCALC 116 (H) 07/29/2016 0000    Additional studies/ records that were reviewed today include:   Doppler echocardiogram August 15, 2015: Study Conclusions  - Left ventricle: The cavity size was  normal. Wall thickness was   increased in a pattern of moderate LVH. There was focal basal   hypertrophy. Systolic function was mildly to moderately reduced.   The estimated ejection fraction was in the range of 40% to 45%.   Doppler parameters are consistent with abnormal left ventricular   relaxation (grade 1 diastolic dysfunction). - Right atrium: The atrium was mildly dilated.  ASSESSMENT:    1. Chronic combined systolic and diastolic CHF (congestive heart failure) ([place])   2. Coronary artery disease involving native coronary artery of native heart with other form of angina pectoris ([place])   3. Other hyperlipidemia   4. Essential hypertension, benign      PLAN:  In order of problems listed above:  1. No evidence of volume overload.  Continue current therapy.  Continue protective therapy with ACE inhibitor and beta-blocker therapy.  Continue excellent blood pressure control with target 130/80 mmHg or less.  Consider functional imaging on next office visit. 2. Stable without angina.  Relatively sedentary.  Last myocardial imaging study was 7 years ago.  Initially felt that he did not need imaging but I will recommend that he undergo a myocardial perfusion study sometime over the next 2 to 3 months to make sure we are not missing progression of disease.  Only heart catheterization was performed by Dr. Martinique in 2004 at which time single-vessel coronary disease was noted in therapy with a mid LAD stent was performed. 3. LDL target less than 70.  Most recent in May 2019 was 95 mg/dL.   Being followed and managed by Dr. Marton Redwood.  He is on moderate intensity statin therapy.  We should consider increasing to high intensity therapy of at least 40 mg daily. 4. Target 130/80 mmHg or less.  Low-salt diet.  Weight loss.  Compliance with CPAP.  Aerobic activity.    After looking over data from the past 10 years, I believe we need at least a myocardial perfusion study to exclude progression of coronary disease.  Medication Adjustments/Labs and Tests Ordered: Current medicines are reviewed at length with the patient today.  Concerns regarding medicines are outlined above.  Medication changes, Labs and Tests ordered today are listed in the Patient Instructions below. Patient Instructions  Medication Instructions:  Labwork: No Changes  Testing/Procedures: None Ordered  Follow-Up: Your physician wants you to follow-up in: one year with [name]. You will receive a reminder letter in the mail two months in advance. If you don't receive a letter, please call our office to schedule the follow-up appointment.   Any Other Special Instructions Will Be Listed Below (If Applicable).     If you need a refill on your cardiac medications before your next appointment, please call your pharmacy.     Signed, Sinclair Grooms, MD  10/21/2017 5:39 PM    [address], [place], [place]  [address] Phone: [phone]; Fax: [phone]

## 2017-10-21 NOTE — Patient Instructions (Addendum)
Medication Instructions:  Labwork: No Changes  Testing/Procedures: None Ordered  Follow-Up: Your physician wants you to follow-up in: one year with [name]. You will receive a reminder letter in the mail two months in advance. If you don't receive a letter, please call our office to schedule the follow-up appointment.   Any Other Special Instructions Will Be Listed Below (If Applicable).     If you need a refill on your cardiac medications before your next appointment, please call your pharmacy.

## 2017-10-24 ENCOUNTER — Encounter: Payer: Self-pay | Admitting: *Deleted

## 2017-10-24 ENCOUNTER — Telehealth: Payer: Self-pay | Admitting: *Deleted

## 2017-10-24 DIAGNOSIS — I25118 Atherosclerotic heart disease of native coronary artery with other forms of angina pectoris: Secondary | ICD-10-CM

## 2017-10-24 NOTE — Telephone Encounter (Signed)
Spoke with pt and he asked that I call back later.

## 2017-10-24 NOTE — Telephone Encounter (Signed)
Spoke with pt and went over recommendations per [name].  Pt would like for me to mail the instructions to him.  Advised I will place order for test and have scheduler contact him with appt.  Advised to call if any questions about instructions once he receives them.  Pt verbalized understanding and was in agreement with this plan.

## 2017-10-24 NOTE — Telephone Encounter (Signed)
-----   Message from Belva Crome, MD sent at 10/21/2017  6:08 PM EDT ----- Regarding: Ischemia work-up Upon completing Dr. Jacelyn Grip note for the office visit on 10/21/2017, I realize it is been more than 7 years since an ischemia work-up was done.  He needs to have a pharmacologic nuclear study done sometime over the next 3 months to make sure we are not missing silent ischemia/progression.  He is diabetic.

## 2017-10-28 ENCOUNTER — Other Ambulatory Visit: Payer: Self-pay | Admitting: Interventional Cardiology

## 2017-10-28 DIAGNOSIS — H40013 Open angle with borderline findings, low risk, bilateral: Secondary | ICD-10-CM | POA: Diagnosis not present

## 2017-10-28 DIAGNOSIS — E119 Type 2 diabetes mellitus without complications: Secondary | ICD-10-CM | POA: Diagnosis not present

## 2017-10-28 DIAGNOSIS — H25013 Cortical age-related cataract, bilateral: Secondary | ICD-10-CM | POA: Diagnosis not present

## 2017-10-28 DIAGNOSIS — H2513 Age-related nuclear cataract, bilateral: Secondary | ICD-10-CM | POA: Diagnosis not present

## 2017-10-28 DIAGNOSIS — H524 Presbyopia: Secondary | ICD-10-CM | POA: Diagnosis not present

## 2017-10-28 MED FILL — RAMIPRIL 10 MG CAPS: 10 | 90 days supply | Qty: 180 | Fill #2

## 2017-10-28 MED FILL — CLOPIDOGREL 75 MG TABLET: 75 | 90 days supply | Qty: 90 | Fill #0

## 2017-10-28 MED FILL — JARDIANCE 25 MG TABLET: 25 | 90 days supply | Qty: 90 | Fill #2

## 2017-10-28 MED FILL — METFORMIN HCL ER 500 MG TAB: 500 | 90 days supply | Qty: 360 | Fill #0

## 2017-10-28 MED FILL — AMLODIPINE BESYLATE 5 MG TA: 5 | 90 days supply | Qty: 90 | Fill #0

## 2017-10-28 MED FILL — TRULICITY 1.5 MG/0.5 ML PEN: 1.5 | 84 days supply | Qty: 6 | Fill #2

## 2017-10-28 MED FILL — ATORVASTATIN 40 MG TABLET: 40 | 90 days supply | Qty: 90 | Fill #2

## 2017-11-11 ENCOUNTER — Ambulatory Visit: Payer: 59 | Admitting: Podiatry

## 2017-11-18 ENCOUNTER — Ambulatory Visit: Payer: 59 | Admitting: Podiatry

## 2017-11-18 ENCOUNTER — Encounter: Payer: Self-pay | Admitting: Podiatry

## 2017-11-18 DIAGNOSIS — M7741 Metatarsalgia, right foot: Secondary | ICD-10-CM | POA: Diagnosis not present

## 2017-11-18 DIAGNOSIS — E1151 Type 2 diabetes mellitus with diabetic peripheral angiopathy without gangrene: Secondary | ICD-10-CM | POA: Diagnosis not present

## 2017-11-18 DIAGNOSIS — B351 Tinea unguium: Secondary | ICD-10-CM | POA: Diagnosis not present

## 2017-11-18 DIAGNOSIS — M7742 Metatarsalgia, left foot: Secondary | ICD-10-CM | POA: Diagnosis not present

## 2017-11-20 NOTE — Progress Notes (Signed)
  Subjective:  Patient ID: Ryan Bombard, MD, male    DOB: [birth year],  MRN: [id number]  Chief Complaint  Patient presents with  . Nail Problem    nail trim   67 y.o. male returns for the above complaint.  Denies new pedal issues.  Did not get the orthotics discussed last visit  Objective:   General AA&O x3. Normal mood and affect.  Vascular Dorsalis pedis pulses present 1+ bilaterally  Posterior tibial pulses absent bilaterally  Capillary refill normal to all digits. Pedal hair growth diminished.  Neurologic Epicritic sensation present bilaterally. Protective sensation with 5.07 monofilament  present bilaterally. Vibratory sensation present bilaterally.  Dermatologic No open lesions. Interspaces clear of maceration.  Normal skin temperature and turgor. Hyperkeratotic lesions: none bilaterally. Nails: Elongated and thickened with dystrophic changes, subungual debris  Xerosis to the plantar foot bilateral  Orthopedic: No history of amputation. MMT 5/5 in dorsiflexion, plantarflexion, inversion, and eversion. Flattened arch bilaterally with pain to palpation of the forefoot Normal lower extremity joint ROM without pain or crepitus.   Assessment & Plan:  Patient was evaluated and treated and all questions answered.  Diabetes with PAD , Onychomycosis -Educated on diabetic footcare. Diabetic risk level  1 -Nails x10 debrided sharply and manually with large nail nipper and rotary burr.  Procedure: Nail Debridement Rationale: Patient meets criteria for routine foot care due to Class B findings. Type of Debridement: manual, sharp debridement. Instrumentation: Nail nipper, rotary burr. Number of Nails: 10  Reordered custom molded orthotics  No follow-ups on file.

## 2017-11-22 ENCOUNTER — Other Ambulatory Visit: Payer: Self-pay

## 2017-11-22 ENCOUNTER — Emergency Department (HOSPITAL_COMMUNITY): Payer: 59

## 2017-11-22 ENCOUNTER — Encounter (HOSPITAL_COMMUNITY): Payer: Self-pay | Admitting: Emergency Medicine

## 2017-11-22 ENCOUNTER — Other Ambulatory Visit: Payer: Self-pay | Admitting: Interventional Cardiology

## 2017-11-22 ENCOUNTER — Emergency Department (HOSPITAL_COMMUNITY)
Admission: EM | Admit: 2017-11-22 | Discharge: 2017-11-22 | Disposition: A | Discharge status: Home / Self Care | Payer: 59 | Attending: Emergency Medicine | Admitting: Emergency Medicine

## 2017-11-22 DIAGNOSIS — I2510 Atherosclerotic heart disease of native coronary artery without angina pectoris: Secondary | ICD-10-CM | POA: Diagnosis not present

## 2017-11-22 DIAGNOSIS — R079 Chest pain, unspecified: Secondary | ICD-10-CM | POA: Diagnosis not present

## 2017-11-22 DIAGNOSIS — R600 Localized edema: Secondary | ICD-10-CM | POA: Diagnosis not present

## 2017-11-22 DIAGNOSIS — Z79899 Other long term (current) drug therapy: Secondary | ICD-10-CM | POA: Diagnosis not present

## 2017-11-22 DIAGNOSIS — I499 Cardiac arrhythmia, unspecified: Secondary | ICD-10-CM | POA: Diagnosis not present

## 2017-11-22 DIAGNOSIS — I252 Old myocardial infarction: Secondary | ICD-10-CM | POA: Insufficient documentation

## 2017-11-22 DIAGNOSIS — R0789 Other chest pain: Secondary | ICD-10-CM | POA: Diagnosis not present

## 2017-11-22 DIAGNOSIS — I5042 Chronic combined systolic (congestive) and diastolic (congestive) heart failure: Secondary | ICD-10-CM | POA: Diagnosis not present

## 2017-11-22 DIAGNOSIS — F19939 Other psychoactive substance use, unspecified with withdrawal, unspecified: Secondary | ICD-10-CM | POA: Insufficient documentation

## 2017-11-22 DIAGNOSIS — R0602 Shortness of breath: Secondary | ICD-10-CM | POA: Diagnosis not present

## 2017-11-22 DIAGNOSIS — R002 Palpitations: Secondary | ICD-10-CM | POA: Diagnosis not present

## 2017-11-22 DIAGNOSIS — R000 Tachycardia, unspecified: Secondary | ICD-10-CM | POA: Diagnosis not present

## 2017-11-22 DIAGNOSIS — Z7902 Long term (current) use of antithrombotics/antiplatelets: Secondary | ICD-10-CM | POA: Diagnosis not present

## 2017-11-22 DIAGNOSIS — I471 Supraventricular tachycardia: Secondary | ICD-10-CM | POA: Insufficient documentation

## 2017-11-22 DIAGNOSIS — E119 Type 2 diabetes mellitus without complications: Secondary | ICD-10-CM | POA: Diagnosis not present

## 2017-11-22 DIAGNOSIS — Z7984 Long term (current) use of oral hypoglycemic drugs: Secondary | ICD-10-CM | POA: Diagnosis not present

## 2017-11-22 DIAGNOSIS — F19239 Other psychoactive substance dependence with withdrawal, unspecified: Secondary | ICD-10-CM

## 2017-11-22 DIAGNOSIS — I110 Hypertensive heart disease with heart failure: Secondary | ICD-10-CM | POA: Diagnosis not present

## 2017-11-22 DIAGNOSIS — I498 Other specified cardiac arrhythmias: Secondary | ICD-10-CM | POA: Diagnosis not present

## 2017-11-22 DIAGNOSIS — I4719 Other supraventricular tachycardia: Secondary | ICD-10-CM | POA: Insufficient documentation

## 2017-11-22 DIAGNOSIS — I4891 Unspecified atrial fibrillation: Secondary | ICD-10-CM | POA: Diagnosis not present

## 2017-11-22 LAB — COMPREHENSIVE METABOLIC PANEL
ALT: 24 U/L (ref 0–44)
AST: 50 U/L — ABNORMAL HIGH (ref 15–41)
Albumin: 3.3 g/dL — ABNORMAL LOW (ref 3.5–5.0)
Alkaline Phosphatase: 56 U/L (ref 38–126)
Anion gap: 11 (ref 5–15)
BUN: 9 mg/dL (ref 8–23)
CHLORIDE: 111 mmol/L (ref 98–111)
CO2: 24 mmol/L (ref 22–32)
CREATININE: 0.9 mg/dL (ref 0.61–1.24)
Calcium: 8.3 mg/dL — ABNORMAL LOW (ref 8.9–10.3)
GFR calc non Af Amer: >60 mL/min (ref >60)
GLUCOSE: 187 mg/dL — AB (ref 70–99)
Potassium: 4.1 mmol/L (ref 3.5–5.1)
SODIUM: 146 mmol/L — AB (ref 135–145)
Total Bilirubin: 0.9 mg/dL (ref 0.3–1.2)
Total Protein: 5.5 g/dL — ABNORMAL LOW (ref 6.5–8.1)

## 2017-11-22 LAB — MAGNESIUM: Magnesium: 1.7 mg/dL (ref 1.7–2.4)

## 2017-11-22 LAB — CBC WITH DIFFERENTIAL/PLATELET
Basophils Absolute: 0.1 10*3/uL (ref 0.0–0.1)
Basophils Relative: 1 %
EOS PCT: 3 %
Eosinophils Absolute: 0.2 10*3/uL (ref 0.0–0.7)
HCT: 41 % (ref 39.0–52.0)
Hemoglobin: 12.6 g/dL — ABNORMAL LOW (ref 13.0–17.0)
Lymphocytes Relative: 17 %
Lymphs Abs: 1 10*3/uL (ref 0.7–4.0)
MCH: 27.3 pg (ref 26.0–34.0)
MCHC: 30.7 g/dL (ref 30.0–36.0)
MCV: 88.7 fL (ref 78.0–100.0)
MONO ABS: 0.7 10*3/uL (ref 0.1–1.0)
Monocytes Relative: 12 %
NEUTROS ABS: 4.1 10*3/uL (ref 1.7–7.7)
NEUTROS PCT: 67 %
PLATELETS: UNDETERMINED 10*3/uL (ref 150–400)
RBC: 4.62 MIL/uL (ref 4.22–5.81)
RDW: 15.2 % (ref 11.5–15.5)
WBC: 6.1 10*3/uL (ref 4.0–10.5)

## 2017-11-22 LAB — I-STAT TROPONIN, ED
Troponin i, poc: 0.01 ng/mL (ref 0.00–0.08)
Troponin i, poc: 0 ng/mL (ref 0.00–0.08)

## 2017-11-22 LAB — BRAIN NATRIURETIC PEPTIDE: B Natriuretic Peptide: 79.5 pg/mL (ref 0.0–100.0)

## 2017-11-22 MED ORDER — TRIAMTERENE-HCTZ 37.5-25 MG PO CAPS: 1.0000 | ORAL_CAPSULE | Freq: Every morning | ORAL | 3 refills | Status: DC

## 2017-11-22 MED ORDER — CARVEDILOL 25 MG PO TABS: 25.0000 mg | ORAL_TABLET | Freq: Two times a day (BID) | ORAL | 3 refills | Status: DC

## 2017-11-22 MED ORDER — CARVEDILOL 12.5 MG PO TABS
25.0000 mg | ORAL_TABLET | Freq: Once | ORAL | Status: AC
  Administered 2017-11-22: 25 mg via ORAL
  Filled 2017-11-22: qty 2

## 2017-11-22 MED FILL — TRIAMTERENE/HCTZ 37.5/25 CP: 37.5-25 | 90 days supply | Qty: 90 | Fill #0

## 2017-11-22 MED FILL — CARVEDILOL 25 MG TABLET: 25 | 90 days supply | Qty: 180 | Fill #0

## 2017-11-22 MED FILL — CLOBETASOL 0.05% OINTMENT: 0.05 | 15 days supply | Qty: 30 | Fill #0

## 2017-11-22 NOTE — ED Provider Notes (Signed)
Medical screening examination/treatment/procedure(s) were conducted as a shared visit with non-physician practitioner(s) and myself.  I personally evaluated the patient during the encounter.  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  EMS initially noted with heart rate was 170.  Patient was given 5 mg metoprolol and 500 cc of fluids.  Heart rate decreased down to 95 and is remained in sinus rhythm here in the emergency room.  Initial laboratories without any significant abnormalities.  Considering the patient's prior history of coronary artery disease, myocardial infarction and CHF plan is to consult with cardiology and get their recommendations   [name], MD 11/22/17 1432

## 2017-11-22 NOTE — ED Provider Notes (Signed)
[name] Provider Note   CSN: [id number] Arrival date & time: 11/22/17  1019     History   Chief Complaint Chief Complaint  Patient presents with  . 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  Was sitting at rest when he began to have chest pain, central chest, dull, 5/10, nonradiating.  Pain lasted for approximately 30 minutes.  He took his vital signs and found his heart rate to be around 170.  EMS administered 5 mg metoprolol and 500 mL NS.  Patient symptom-free upon arrival in the ED. Denies caffeine use today.  Denies changes in medication, precipitating event, or supplement use.  He states he has been taking all his medications, as prescribed. Denies shortness of breath, N/V/D, recent illness, abdominal pain, dizziness, diaphoresis, acute lower extremity edema, orthopnea, or any other complaints.  Cardiologist: [name]  Past Medical History:  Diagnosis Date  . Chronic combined systolic and diastolic CHF (congestive heart failure) ([place])   . Coronary atherosclerosis of native coronary artery   . Dental crowns present   . Essential hypertension, benign    states under control with meds., has been on med. x 15 yr.  . MI (myocardial infarction) ([place]) 10/27/2002  . Non-insulin dependent type 2 diabetes mellitus ([place])   . Obesity   . Osteoarthritis    hips and knees  . Pure hypercholesterolemia     Patient Active Problem List   Diagnosis Date Noted  . AVNRT (AV nodal re-entry tachycardia) ([place])   . Withdrawal arrhythmia ([place])   . Cubital tunnel syndrome, left 06/29/2016  . CAD (coronary artery disease), native coronary artery 05/08/2014  . Hyperlipidemia 03/21/2013    Class: Chronic  . Old MI (myocardial infarction) 03/21/2013    Class: Chronic  . DM (diabetes mellitus),  type 2, uncontrolled ([place]) 03/21/2013    Class: Chronic  . Essential hypertension, benign 03/21/2013    Class: Chronic  . Obesity (BMI 30-39.9) 03/21/2013  . Chronic combined systolic and diastolic CHF (congestive heart failure) ([place])     Past Surgical History:  Procedure Laterality Date  . CARDIAC CATHETERIZATION  10/27/2002  . CARPAL TUNNEL RELEASE Right 11/21/2013  . CORONARY STENT PLACEMENT  10/27/2002   mid LAD  . TOTAL HIP ARTHROPLASTY Left 04/09/1999  . TOTAL HIP ARTHROPLASTY Right   . TOTAL KNEE ARTHROPLASTY Left 02/03/2011  . ULNAR TUNNEL RELEASE Left 06/18/2016   Procedure: LEFT CUBITAL TUNNEL RELEASE;  Surgeon: [name], MD;  Location: [facility];  Service: Orthopedics;  Laterality: Left;        Home Medications    Prior to Admission medications   Medication Sig Start Date End Date Taking? Authorizing Provider  amLODipine (NORVASC) 5 MG tablet TAKE 1 TABLET BY MOUTH ONCE DAILY 10/28/17  Yes Belva Crome, MD  atorvastatin (LIPITOR) 40 MG tablet Take 40 mg by mouth daily. 10/28/17  Yes [provider]  calcium carbonate (TUMS EX) 750 MG chewable tablet Chew 2 tablets by mouth as needed for heartburn.   Yes [provider]  ciclopirox (LOPROX) 0.77 % cream Apply topically 2 (two) times daily. 05/08/14  Yes Lahoma Crocker, MD  clopidogrel (PLAVIX) 75 MG tablet Take 75 mg by mouth daily.   Yes [provider]  JARDIANCE 25 MG TABS tablet Take 25 mg by mouth daily.  04/28/17  Yes [provider]  Menthol, Topical Analgesic, (BIOFREEZE ROLL-ON EX) Apply 1 application topically as needed (arthritis).   Yes [provider]  metFORMIN (GLUCOPHAGE-XR) 500 MG 24 hr tablet Take 1,000 mg by mouth 2 (two) times daily. 10/28/17  Yes [provider]  naproxen sodium (ALEVE) 220 MG tablet Take 220 mg by mouth as needed (pain).   Yes [provider]  Prenatal Vit-Fe Fumarate-FA (PRENATAL MULTIVITAMIN)  TABS tablet Take 1 tablet by mouth daily at 12 noon.   Yes [provider]  ramipril (ALTACE) 10 MG capsule Take 10 mg by mouth 2 (two) times daily.    Yes [provider]  TRULICITY 1.5 ZG/0.1VC SOPN Inject 1.5 mg into the skin once a week.  04/28/17  Yes [provider]  atorvastatin (LIPITOR) 20 MG tablet Take 1 tablet (20 mg total) by mouth daily. Patient not taking: Reported on 11/22/2017 08/06/16 11/22/17  Belva Crome, MD  carvedilol (COREG) 25 MG tablet Take 1 tablet (25 mg total) by mouth 2 (two) times daily with a meal. 11/22/17   Reino Bellis B, NP  clobetasol ointment (TEMOVATE) 9.44 % Apply 1 application topically 2 (two) times daily. Patient not taking: Reported on 11/22/2017 09/19/17   Kandis Cocking A, CNM  terbinafine (LAMISIL) 250 MG tablet Take 1 tablet (250 mg total) by mouth daily. Patient not taking: Reported on 11/22/2017 06/10/17   Evelina Bucy, DPM  triamterene-hydrochlorothiazide (DYAZIDE) 37.5-25 MG capsule Take 1 each (1 capsule total) by mouth every morning. 11/22/17   Cheryln Manly, NP    Family History Family History  Problem Relation Age of Onset  . Healthy Mother   . Healthy Father     Social History Social History   Tobacco Use  . Smoking status: Never Smoker  . Smokeless tobacco: Never Used  Substance Use Topics  . Alcohol use: Yes    Alcohol/week: 0.0 oz    Comment: occasionally  . Drug use: No     Allergies   Patient has no known allergies.   Review of Systems Review of Systems  Constitutional: Negative for chills, diaphoresis and fever.  Respiratory: Negative for shortness of breath.   Cardiovascular: Positive for chest pain and palpitations.  Gastrointestinal: Negative for abdominal pain, diarrhea, nausea and vomiting.  Neurological: Negative for dizziness, syncope and light-headedness.  All other systems reviewed and are negative.    Physical Exam Updated Vital Signs BP (!) 136/117 (BP Location:  Left Arm)   Pulse (!) 149   Temp 97.6 F (36.4 C) (Oral)   Resp (!) 27   Ht 5\' 5"  (1.651 m)   Wt 104.3 kg (230 lb)   SpO2 97%   BMI 38.27 kg/m   Physical Exam  Constitutional: He appears well-developed and well-nourished. No distress.  HENT:  Head: Normocephalic and atraumatic.  Eyes: Conjunctivae are normal.  Neck: Neck supple.  Cardiovascular: Normal rate, regular rhythm, normal heart sounds and intact distal pulses.  Patient no longer tachycardic upon my assessment.  Pulmonary/Chest: Effort normal and breath sounds normal. No respiratory distress.  No increased work of breathing.  Speaks in full sentences without difficulty.  Abdominal: Soft. There is no tenderness. There is no guarding.  Musculoskeletal: He exhibits edema.  Bilateral lower extremity edema with chronic appearance.  Lymphadenopathy:    He has no cervical adenopathy.  Neurological: He is alert.  Skin: Skin is warm and  dry. He is not diaphoretic.  Psychiatric: He has a normal mood and affect. His behavior is normal.  Nursing note and vitals reviewed.    ED Treatments / Results  Labs (all labs ordered are listed, but only abnormal results are displayed) Labs Reviewed  COMPREHENSIVE METABOLIC PANEL - Abnormal; Notable for the following components:      Result Value   Sodium 146 (*)    Glucose, Bld 187 (*)    Calcium 8.3 (*)    Total Protein 5.5 (*)    Albumin 3.3 (*)    AST 50 (*)    All other components within normal limits  CBC WITH DIFFERENTIAL/PLATELET - Abnormal; Notable for the following components:   Hemoglobin 12.6 (*)    All other components within normal limits  BRAIN NATRIURETIC PEPTIDE  MAGNESIUM  I-STAT TROPONIN, ED  I-STAT TROPONIN, ED    EKG EKG Interpretation  Date/Time:  Tuesday November 22 2017 10:25:23 EDT Ventricular Rate:  150 PR Interval:    QRS Duration: 86 QT Interval:  359 QTC Calculation: 568 R Axis:   -64 Text Interpretation:  Sinus or ectopic atrial tachycardia  Left anterior fascicular block Probable anterior infarct, age indeterminate Prolonged QT interval Since last tracing rate faster Confirmed by [name] [phone]) on 11/22/2017 10:43:23 AM   Radiology Dg Chest 2 View  Result Date: 11/22/2017 CLINICAL DATA:  Midline chest pain since this morning. EXAM: CHEST - 2 VIEW COMPARISON:  02/07/2011 FINDINGS: The heart is borderline enlarged but stable. There is moderate tortuosity of the thoracic aorta. Mild central vascular congestion but no edema, infiltrates or effusions. The bony thorax is intact. IMPRESSION: Mild cardiac enlargement but no acute pulmonary findings. Electronically Signed   By: Marijo Sanes M.D.   On: 11/22/2017 12:19    Procedures Procedures (including critical care time)  Medications Ordered in ED Medications  carvedilol (COREG) tablet 25 mg (25 mg Oral Given 11/22/17 1606)     Initial Impression / Assessment and Plan / ED Course  I have reviewed the triage vital signs and the nursing notes.  Pertinent labs & imaging results that were available during my care of the patient were reviewed by me and considered in my medical decision making (see chart for details).  Clinical Course as of Nov 22 1608  Tue Nov 22, 2017  1133 Spoke with Wannetta Sender, Cards Master. They will send someone to assess patient.    [SJ]  4270 Reevaluated patient and reviewed labs.  Patient continues to deny complaints.   [SJ]  6237 Patient continues to deny complaints.    [SJ]  6283 Spoke with Reino Bellis, Cardiology NP. States she will get in contact with [name] and advise.    [SJ]  52 Spoke with [name], cardiologist. States patient did suddenly remember that he did not take his carvedilol this morning.  Patient is to get a dose of carvedilol and then he may be discharged.  He already has appropriate follow-up scheduled.   [SJ]    Clinical Course User Index [SJ] Joy, Shawn C, PA-C    Patient presents with an episode of chest pain in the  setting of tachycardia.  Chest pain resolved prior to arrival.  Tachycardia resolved shortly after arrival.  Delta troponins negative.  Patient remained complaint free throughout ED course.  Patient did eventually note that he thinks he has not been taking his carvedilol.  He was given a dose of carvedilol and will follow up with cardiology outpatient.  Return  precautions discussed.  Patient voices understanding of these instructions, accepts the plan, and is comfortable with discharge.   Findings and plan of care discussed with [name], MD. Dr. Tomi Bamberger personally evaluated and examined this patient.  Vitals:   11/22/17 1027 11/22/17 1030 11/22/17 1037 11/22/17 1045  BP: (!) 136/117 125/87  116/82  Pulse: (!) 149 (!) 141 95 93  Resp: (!) 27 (!) 23 (!) 22 17  Temp: 97.6 F (36.4 C)     TempSrc: Oral     SpO2: 97% 95% 96% 95%  Weight: 104.3 kg (230 lb)     Height: 5\' 5"  (1.651 m)        Final Clinical Impressions(s) / ED Diagnoses   Final diagnoses:  Tachycardia    ED Discharge Orders        Ordered    triamterene-hydrochlorothiazide (DYAZIDE) 37.5-25 MG capsule   Every morning - 10a     11/22/17 1527    carvedilol (COREG) 25 MG tablet  2 times daily with meals     11/22/17 1527       Lorayne Bender, PA-C 11/22/17 1610    [name], MD 11/23/17 1400

## 2017-11-22 NOTE — ED Notes (Signed)
ED Provider at bedside. 

## 2017-11-22 NOTE — Discharge Instructions (Signed)
Follow-up with the cardiologist, as planned.  Be sure to take your medications, as prescribed.  Return to the ED should symptoms recur.

## 2017-11-22 NOTE — ED Triage Notes (Signed)
Pt at work and had a sudden on set of chest discomfort pt has his vital signs taken and his heart rate was 170, on EMS arrival pt was asymptomatic with heart rate of 170 in route pt received 5 mg metoprolol and 500 mL fluid

## 2017-11-22 NOTE — Consult Note (Signed)
Cardiology Consult    Patient ID: Ryan Bombard, MD MRN: [id number], DOB/AGE: May 19, 1951   Admit date: 11/22/2017 Date of Consult: 11/22/2017  Primary Physician: Marton Redwood, MD Primary Cardiologist: [name] Requesting Provider: Dr. 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 Tomi Bamberger.   Patient Profile    67 yo male with PMH of chronic combined HF, CAD s/p mLAD stenting in 2004, HTN, HL, and NIDDM who presented with tachycardia and chest discomfort.     Past Medical History   Past Medical History:  Diagnosis Date  . Chronic combined systolic and diastolic CHF (congestive heart failure) ([place])   . Coronary atherosclerosis of native coronary artery   . Dental crowns present   . Essential hypertension, benign    states under control with meds., has been on med. x 15 yr.  . MI (myocardial infarction) ([place]) 10/27/2002  . Non-insulin dependent type 2 diabetes mellitus ([place])   . Obesity   . Osteoarthritis    hips and knees  . Pure hypercholesterolemia     Past Surgical History:  Procedure Laterality Date  . CARDIAC CATHETERIZATION  10/27/2002  . CARPAL TUNNEL RELEASE Right 11/21/2013  . CORONARY STENT PLACEMENT  10/27/2002   mid LAD  . TOTAL HIP ARTHROPLASTY Left 04/09/1999  . TOTAL HIP ARTHROPLASTY Right   . TOTAL KNEE ARTHROPLASTY Left 02/03/2011  . 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 He is followed by [name] as an outpatient. Underwent cath back in 2004 and had stent placed to  the mLAD with Dr. [place]. Last echo was back in 3/17 showing continued EF of 40-45% with G1DD and mildly dilated. He was recently seen in the office with [name] back in may and planned for a stress test as outpatient for follow up of his CAD.   States he has been in his usual state of health up until this morning while he was at work. Around 9:30 developed some chest discomfort and generally unwell feeling. Thought this might have been his arthritis and took naprosyn. Symptoms persisted and his nurse ended up taking his vitals and noted his HR in the 180s. EMS was called. States his HR improved to around 150s prior their arrival and he actually did feel slightly better. EMS reported SVT and was given 5mg  lopressor. In the ED his rate was around 150s and appears to be ST on EKG obtained here. HR slowly drifted down into the 80-90s. Trop neg x2. Currently asymptomatic at the time of exam. Other labs showed stable electrolytes, and Hgb 12.6. Denies any caffeine use today.  Inpatient Medications      Family History    Family History  Problem Relation Age of Onset  . Healthy Mother   . Healthy Father     Social History    Social History   Socioeconomic History  . Marital status: Married    Spouse name: Not on file  . Number of children: Not on file  .  Years of education: Not on file  . Highest education level: Not on file  Occupational History  . Not on file  Social Needs  . Financial resource strain: Not on file  . Food insecurity:    Worry: Not on file    Inability: Not on file  . Transportation needs:    Medical: Not on file    Non-medical: Not on file  Tobacco Use  . Smoking status: Never Smoker  . Smokeless tobacco: Never Used  Substance and Sexual Activity  . Alcohol use: Yes    Alcohol/week: 0.0 oz    Comment: occasionally  . Drug use: No  . Sexual activity: Not on file  Lifestyle  . Physical activity:    Days per week: Not on file    Minutes per session: Not on  file  . Stress: Not on file  Relationships  . Social connections:    Talks on phone: Not on file    Gets together: Not on file    Attends religious service: Not on file    Active member of club or organization: Not on file    Attends meetings of clubs or organizations: Not on file    Relationship status: Not on file  . Intimate partner violence:    Fear of current or ex partner: Not on file    Emotionally abused: Not on file    Physically abused: Not on file    Forced sexual activity: Not on file  Other Topics Concern  . Not on file  Social History Narrative  . Not on file     Review of Systems    See HPI  All other systems reviewed and are otherwise negative except as noted above.  Physical Exam    Blood pressure 134/88, pulse 90, temperature 97.6 F (36.4 C), temperature source Oral, resp. rate (!) 22, height 5\' 5"  (1.651 m), weight 230 lb (104.3 kg), SpO2 95 %.  General: Pleasant, older AAM, NAD Psych: Normal affect. Neuro: Alert and oriented X 3. Moves all extremities spontaneously. HEENT: Normal  Neck: Supple without bruits or JVD. Lungs:  Resp regular and unlabored, CTA. Heart: RRR no s3, s4, or murmurs. Abdomen: Soft, non-tender, non-distended, BS + x 4.  Extremities: No clubbing, cyanosis or edema. DP/PT/Radials 2+ and equal bilaterally.  Labs    Troponin (Point of Care Test) Recent Labs    11/22/17 1350  TROPIPOC 0.00   No results for input(s): CKTOTAL, CKMB, TROPONINI in the last 72 hours. Lab Results  Component Value Date   WBC 6.1 11/22/2017   HGB 12.6 (L) 11/22/2017   HCT 41.0 11/22/2017   MCV 88.7 11/22/2017   PLT PLATELET CLUMPS NOTED ON SMEAR, UNABLE TO ESTIMATE 11/22/2017    Recent Labs  Lab 11/22/17 1034  NA 146*  K 4.1  CL 111  CO2 24  BUN 9  CREATININE 0.90  CALCIUM 8.3*  PROT 5.5*  BILITOT 0.9  ALKPHOS 56  ALT 24  AST 50*  GLUCOSE 187*   Lab Results  Component Value Date   CHOL 190 07/29/2016   HDL 55 07/29/2016    LDLCALC 116 (H) 07/29/2016   TRIG 93 07/29/2016   No results found for: [facility]   Radiology Studies    Dg Chest 2 View  Result Date: 11/22/2017 CLINICAL DATA:  Midline chest pain since this morning. EXAM: CHEST - 2 VIEW COMPARISON:  02/07/2011 FINDINGS: The heart is borderline enlarged but stable. There is moderate tortuosity of the thoracic  aorta. Mild central vascular congestion but no edema, infiltrates or effusions. The bony thorax is intact. IMPRESSION: Mild cardiac enlargement but no acute pulmonary findings. Electronically Signed   By: Marijo Sanes M.D.   On: 11/22/2017 12:19    ECG & Cardiac Imaging    EKG:  The EKG was personally reviewed and demonstrates ST  Echo: 08/15/15  Study Conclusions  - Left ventricle: The cavity size was normal. Wall thickness was   increased in a pattern of moderate LVH. There was focal basal   hypertrophy. Systolic function was mildly to moderately reduced.   The estimated ejection fraction was in the range of 40% to 45%.   Doppler parameters are consistent with abnormal left ventricular   relaxation (grade 1 diastolic dysfunction). - Right atrium: The atrium was mildly dilated.  Assessment & Plan    67 yo male with PMH of chronic combined HF, CAD s/p mLAD stenting in 2004, HTN, HL, and NIDDM who presented with tachycardia and chest discomfort.  1. Chest discomfort with tachycardia: Onset at 930am while at work today. Noted to be in SVT and given lopressor 5mg  with HR eventually returning to baseline. Trop neg x2. Asymptomatic in the ED. Pt did inform [name] that he remembered being out of his coreg since Sunday. Suspect this may be related to rebound tachycardia given his missed doses.  -- give coreg 25mg  once now and will send in Rx to his Pharmacy.  -- already has follow up arranged for outpatient stress test.   2. HTN: has been out of his coreg and dyazide for several days. Will send in refills to his pharmacy  3. CAD s/p mLAD: Trop  neg x2, and EKG without ischemia. Set up for outpatient stress test.   4. HL: continue on Lipitor 40mg  daily.  Barnet Pall, NP-C Pager [phone] 11/22/2017, 2:46 PM

## 2017-11-22 NOTE — ED Notes (Signed)
Cards at bedside

## 2018-01-05 MED FILL — SULFAMETHOXAZOLE-TMP DS TAB: 800-160 | 7 days supply | Qty: 14 | Fill #0

## 2018-01-05 MED FILL — MUPIROCIN 2% OINTMENT: 2 | 10 days supply | Qty: 22 | Fill #0

## 2018-02-08 MED FILL — AMLODIPINE BESYLATE 5 MG TA: 5 | 90 days supply | Qty: 90 | Fill #1

## 2018-02-08 MED FILL — METFORMIN HCL ER 500 MG TAB: 500 | 90 days supply | Qty: 360 | Fill #1

## 2018-02-08 MED FILL — CLOPIDOGREL 75 MG TABLET: 75 | 90 days supply | Qty: 90 | Fill #1

## 2018-02-08 MED FILL — RAMIPRIL 10 MG CAPSULE: 10 | 90 days supply | Qty: 180 | Fill #3

## 2018-02-08 MED FILL — ATORVASTATIN 40 MG TABLET: 40 | 90 days supply | Qty: 90 | Fill #3

## 2018-02-08 MED FILL — TRULICITY 1.5 MG/0.5 ML PEN: 1.5 | 84 days supply | Qty: 6 | Fill #3

## 2018-02-10 ENCOUNTER — Encounter (HOSPITAL_COMMUNITY): Payer: Self-pay

## 2018-02-22 MED FILL — CARVEDILOL 25 MG TABLET: 25 | 180 days supply | Qty: 180 | Fill #0

## 2018-02-22 MED FILL — TRIAMTERENE/HCTZ 37.5/25 CP: 37.5-25 | 90 days supply | Qty: 90 | Fill #0

## 2018-02-24 ENCOUNTER — Ambulatory Visit: Payer: 59 | Admitting: Podiatry

## 2018-03-03 ENCOUNTER — Ambulatory Visit: Payer: 59 | Admitting: Podiatry

## 2018-03-03 DIAGNOSIS — E1151 Type 2 diabetes mellitus with diabetic peripheral angiopathy without gangrene: Secondary | ICD-10-CM | POA: Diagnosis not present

## 2018-03-03 DIAGNOSIS — B351 Tinea unguium: Secondary | ICD-10-CM | POA: Diagnosis not present

## 2018-03-13 ENCOUNTER — Telehealth (HOSPITAL_COMMUNITY): Payer: Self-pay | Admitting: *Deleted

## 2018-03-13 NOTE — Telephone Encounter (Signed)
Left message on voicemail in reference to upcoming appointment scheduled for 03/18/18. Phone number given for a call back so details instructions can be given. Kirstie Peri

## 2018-03-17 ENCOUNTER — Encounter (HOSPITAL_COMMUNITY): Payer: Self-pay

## 2018-03-21 NOTE — Progress Notes (Signed)
Subjective:  Patient ID: Ryan Bombard, MD, male    DOB: 01-31-1951,  MRN: [id number]  Chief Complaint  Patient presents with  . Nail Problem    3 month nail trim    67 y.o. male presents  for diabetic foot care. Last AMBS was unknown. Denies numbness and tingling in their feet. Denies cramping in legs and thighs.  Review of Systems: Negative except as noted in the HPI. Denies N/V/F/Ch.  Past Medical History:  Diagnosis Date  . Chronic combined systolic and diastolic CHF (congestive heart failure) ([place])   . Coronary atherosclerosis of native coronary artery   . Dental crowns present   . Essential hypertension, benign    states under control with meds., has been on med. x 15 yr.  . MI (myocardial infarction) ([place]) 10/27/2002  . Non-insulin dependent type 2 diabetes mellitus ([place])   . Obesity   . Osteoarthritis    hips and knees  . Pure hypercholesterolemia     Current Outpatient Medications:  .  amLODipine (NORVASC) 5 MG tablet, TAKE 1 TABLET BY MOUTH ONCE DAILY, Disp: 90 tablet, Rfl: 1 .  atorvastatin (LIPITOR) 20 MG tablet, Take 1 tablet (20 mg total) by mouth daily. (Patient not taking: Reported on 11/22/2017), Disp: 90 tablet, Rfl: 3 .  atorvastatin (LIPITOR) 40 MG tablet, Take 40 mg by mouth daily., Disp: , Rfl: 3 .  calcium carbonate (TUMS EX) 750 MG chewable tablet, Chew 2 tablets by mouth as needed for heartburn., Disp: , Rfl:  .  carvedilol (COREG) 25 MG tablet, Take 1 tablet (25 mg total) by mouth 2 (two) times daily with a meal., Disp: 60 tablet, Rfl: 3 .  carvedilol (COREG) 25 MG tablet, TAKE 1 TABLET BY MOUTH TWICE DAILY WITH FOOD, Disp: 180 tablet, Rfl: 2 .  ciclopirox (LOPROX) 0.77 % cream, Apply topically 2 (two) times daily., Disp: 90 g, Rfl: prn .  clobetasol ointment (TEMOVATE) 7.82 %, Apply 1 application topically 2 (two) times daily. (Patient not taking: Reported on 11/22/2017), Disp: 30 g, Rfl: 0 .  clopidogrel (PLAVIX) 75 MG tablet, Take 75 mg by mouth  daily., Disp: , Rfl:  .  JARDIANCE 25 MG TABS tablet, Take 25 mg by mouth daily. , Disp: , Rfl: 3 .  Menthol, Topical Analgesic, (BIOFREEZE ROLL-ON EX), Apply 1 application topically as needed (arthritis)., Disp: , Rfl:  .  metFORMIN (GLUCOPHAGE-XR) 500 MG 24 hr tablet, Take 1,000 mg by mouth 2 (two) times daily., Disp: , Rfl: 5 .  naproxen sodium (ALEVE) 220 MG tablet, Take 220 mg by mouth as needed (pain)., Disp: , Rfl:  .  Prenatal Vit-Fe Fumarate-FA (PRENATAL MULTIVITAMIN) TABS tablet, Take 1 tablet by mouth daily at 12 noon., Disp: , Rfl:  .  ramipril (ALTACE) 10 MG capsule, Take 10 mg by mouth 2 (two) times daily. , Disp: , Rfl:  .  terbinafine (LAMISIL) 250 MG tablet, Take 1 tablet (250 mg total) by mouth daily. (Patient not taking: Reported on 11/22/2017), Disp: 90 tablet, Rfl: 0 .  triamterene-hydrochlorothiazide (DYAZIDE) 37.5-25 MG capsule, Take 1 each (1 capsule total) by mouth every morning., Disp: 30 capsule, Rfl: 3 .  triamterene-hydrochlorothiazide (DYAZIDE) 37.5-25 MG capsule, TAKE 1 CAPSULE BY MOUTH EVERY MORNING, Disp: 90 capsule, Rfl: 2 .  TRULICITY 1.5 NF/6.2ZH SOPN, Inject 1.5 mg into the skin once a week. , Disp: , Rfl: 3  Social History   Tobacco Use  Smoking Status Never Smoker  Smokeless Tobacco Never Used  No Known Allergies Objective:  There were no vitals filed for this visit. There is no height or weight on file to calculate BMI. Constitutional Well developed. Well nourished.  Vascular Dorsalis pedis pulses present 2+ bilaterally  Posterior tibial pulses absent bilaterally  Pedal hair growth diminished. Capillary refill normal to all digits.  No cyanosis or clubbing noted.  Neurologic Normal speech. Oriented to person, place, and time. Epicritic sensation to light touch grossly present bilaterally. Protective sensation with 5.07 monofilament  present bilaterally. Vibratory sensation present bilaterally.  Dermatologic Nails elongated, thickened,  dystrophic. No open wounds. No skin lesions.  Orthopedic: Normal joint ROM without pain or crepitus bilaterally. No visible deformities. No bony tenderness.   Assessment:   1. Diabetes mellitus type 2 with peripheral artery disease ([place])   2. Onychomycosis    Plan:  Patient was evaluated and treated and all questions answered.  Diabetes with PAD, Onychomycosis -Educated on diabetic footcare. Diabetic risk level 1 -Nails x10 debrided sharply and manually with large nail nipper and rotary burr.   Procedure: Nail Debridement Rationale: Patient meets criteria for routine foot care due to PAD Type of Debridement: manual, sharp debridement. Instrumentation: Nail nipper, rotary burr. Number of Nails: 10  Return in about 3 months (around 06/03/2018) for Diabetic Foot Care.

## 2018-03-31 DIAGNOSIS — N401 Enlarged prostate with lower urinary tract symptoms: Secondary | ICD-10-CM | POA: Diagnosis not present

## 2018-03-31 DIAGNOSIS — R350 Frequency of micturition: Secondary | ICD-10-CM | POA: Diagnosis not present

## 2018-04-24 ENCOUNTER — Telehealth (HOSPITAL_COMMUNITY): Payer: Self-pay | Admitting: *Deleted

## 2018-04-24 NOTE — Telephone Encounter (Signed)
Patient given detailed instructions per Myocardial Perfusion Study Information Sheet for the test on 04/28/18 Patient notified to arrive 15 minutes early and that it is imperative to arrive on time for appointment to keep from having the test rescheduled.  If you need to cancel or reschedule your appointment, please call the office within 24 hours of your appointment. . Patient verbalized understanding. Kieanna Rollo Jacqueline    

## 2018-04-25 DIAGNOSIS — D696 Thrombocytopenia, unspecified: Secondary | ICD-10-CM | POA: Diagnosis not present

## 2018-04-25 DIAGNOSIS — E119 Type 2 diabetes mellitus without complications: Secondary | ICD-10-CM | POA: Diagnosis not present

## 2018-04-27 MED FILL — DIAZEPAM 10 MG TABS: 10 | 1 days supply | Qty: 1 | Fill #0

## 2018-04-27 MED FILL — levoFLOXacin 750 MG TABS: 750 | 1 days supply | Qty: 1 | Fill #0

## 2018-04-28 ENCOUNTER — Ambulatory Visit (HOSPITAL_COMMUNITY): Payer: 59 | Attending: Cardiology

## 2018-04-28 ENCOUNTER — Encounter (INDEPENDENT_AMBULATORY_CARE_PROVIDER_SITE_OTHER): Payer: Self-pay

## 2018-04-28 DIAGNOSIS — E119 Type 2 diabetes mellitus without complications: Secondary | ICD-10-CM | POA: Diagnosis not present

## 2018-04-28 DIAGNOSIS — I25118 Atherosclerotic heart disease of native coronary artery with other forms of angina pectoris: Secondary | ICD-10-CM | POA: Diagnosis not present

## 2018-04-28 DIAGNOSIS — Z6841 Body Mass Index (BMI) 40.0 and over, adult: Secondary | ICD-10-CM | POA: Diagnosis not present

## 2018-04-28 DIAGNOSIS — I10 Essential (primary) hypertension: Secondary | ICD-10-CM | POA: Diagnosis not present

## 2018-04-28 DIAGNOSIS — E7849 Other hyperlipidemia: Secondary | ICD-10-CM | POA: Diagnosis not present

## 2018-04-28 DIAGNOSIS — Z9861 Coronary angioplasty status: Secondary | ICD-10-CM | POA: Diagnosis not present

## 2018-04-28 DIAGNOSIS — R972 Elevated prostate specific antigen [PSA]: Secondary | ICD-10-CM | POA: Diagnosis not present

## 2018-04-28 LAB — MYOCARDIAL PERFUSION IMAGING
CHL CUP NUCLEAR SRS: 8
CHL CUP NUCLEAR SSS: 13
LV dias vol: 136 mL (ref 62–150)
LV sys vol: 80 mL
NUC STRESS TID: 1.06
Peak HR: 99 {beats}/min
Rest HR: 79 {beats}/min
SDS: 5

## 2018-04-28 MED ORDER — TECHNETIUM TC 99M TETROFOSMIN IV KIT
32.6000 | PACK | Freq: Once | INTRAVENOUS | Status: AC | PRN
  Administered 2018-04-28: 32.6 via INTRAVENOUS
  Filled 2018-04-28: qty 33

## 2018-04-28 MED ORDER — TECHNETIUM TC 99M TETROFOSMIN IV KIT
10.7000 | PACK | Freq: Once | INTRAVENOUS | Status: AC | PRN
  Administered 2018-04-28: 10.7 via INTRAVENOUS
  Filled 2018-04-28: qty 11

## 2018-04-28 MED ORDER — REGADENOSON 0.4 MG/5ML IV SOLN
0.4000 mg | Freq: Once | INTRAVENOUS | Status: AC
  Administered 2018-04-28: 0.4 mg via INTRAVENOUS

## 2018-05-09 DIAGNOSIS — R972 Elevated prostate specific antigen [PSA]: Secondary | ICD-10-CM | POA: Diagnosis not present

## 2018-05-12 ENCOUNTER — Other Ambulatory Visit: Payer: Self-pay | Admitting: Interventional Cardiology

## 2018-05-12 DIAGNOSIS — R972 Elevated prostate specific antigen [PSA]: Secondary | ICD-10-CM | POA: Diagnosis not present

## 2018-05-12 MED FILL — TRIAMTERENE/HCTZ 37.5/25 CP: 37.5-25 | 90 days supply | Qty: 90 | Fill #1

## 2018-05-12 MED FILL — AMLODIPINE BESYLATE 5 MG TA: 5 | 90 days supply | Qty: 90 | Fill #0

## 2018-05-15 MED FILL — RAMIPRIL 10 MG CAPSULE: 10 | 90 days supply | Qty: 180 | Fill #0

## 2018-05-15 MED FILL — ATORVASTATIN CALCIUM 40 MG: 40 | 90 days supply | Qty: 90 | Fill #0

## 2018-05-15 MED FILL — TRULICITY 1.5 MG/0.5 ML PEN: 1.5 | 84 days supply | Qty: 6 | Fill #0

## 2018-05-15 MED FILL — metFORMIN HCL ER 500 MG TB2: 500 | 90 days supply | Qty: 360 | Fill #0

## 2018-05-15 MED FILL — CLOPIDOGREL 75 MG TABLET: 75 | 90 days supply | Qty: 90 | Fill #0

## 2018-05-26 DIAGNOSIS — G4733 Obstructive sleep apnea (adult) (pediatric): Secondary | ICD-10-CM | POA: Diagnosis not present

## 2018-05-29 MED FILL — CARVEDILOL 25 MG TABLET: 25 | 30 days supply | Qty: 60 | Fill #1

## 2018-06-08 MED FILL — SCOPOLAMINE 1 MG/3DAYS PT72: 1 | 30 days supply | Qty: 10 | Fill #0

## 2018-06-09 ENCOUNTER — Ambulatory Visit: Payer: 59 | Admitting: Podiatry

## 2018-06-23 ENCOUNTER — Ambulatory Visit (INDEPENDENT_AMBULATORY_CARE_PROVIDER_SITE_OTHER): Payer: 59 | Admitting: Podiatry

## 2018-06-23 DIAGNOSIS — B351 Tinea unguium: Secondary | ICD-10-CM

## 2018-06-23 DIAGNOSIS — L600 Ingrowing nail: Secondary | ICD-10-CM | POA: Diagnosis not present

## 2018-06-23 DIAGNOSIS — M7741 Metatarsalgia, right foot: Secondary | ICD-10-CM

## 2018-06-23 DIAGNOSIS — M7742 Metatarsalgia, left foot: Secondary | ICD-10-CM

## 2018-06-23 DIAGNOSIS — M779 Enthesopathy, unspecified: Secondary | ICD-10-CM | POA: Diagnosis not present

## 2018-06-23 DIAGNOSIS — E1151 Type 2 diabetes mellitus with diabetic peripheral angiopathy without gangrene: Secondary | ICD-10-CM

## 2018-06-23 DIAGNOSIS — M76829 Posterior tibial tendinitis, unspecified leg: Secondary | ICD-10-CM

## 2018-06-23 NOTE — Patient Instructions (Signed)

## 2018-06-23 NOTE — Progress Notes (Signed)
Subjective:  Patient ID: Ryan Bombard, MD, male    DOB: Jul 31, 1950,  MRN: [id number]  Chief Complaint  Patient presents with  . Foot Problem    left big toe pain. Pt states that it is a superficial pain. And that the pain doesn't go to the bone    68 y.o. male presents  for diabetic foot care.  Also reports pain to the outside of the left great toe states that he thinks is outside the nail.  Has tried applying triple antibiotic ointment without relief.  Review of Systems: Negative except as noted in the HPI. Denies N/V/F/Ch.  Past Medical History:  Diagnosis Date  . Chronic combined systolic and diastolic CHF (congestive heart failure) ([place])   . Coronary atherosclerosis of native coronary artery   . Dental crowns present   . Essential hypertension, benign    states under control with meds., has been on med. x 15 yr.  . MI (myocardial infarction) ([place]) 10/27/2002  . Non-insulin dependent type 2 diabetes mellitus ([place])   . Obesity   . Osteoarthritis    hips and knees  . Pure hypercholesterolemia     Current Outpatient Medications:  .  amLODipine (NORVASC) 5 MG tablet, TAKE 1 TABLET BY MOUTH ONCE DAILY, Disp: 90 tablet, Rfl: 1 .  atorvastatin (LIPITOR) 40 MG tablet, Take 40 mg by mouth daily., Disp: , Rfl: 3 .  calcium carbonate (TUMS EX) 750 MG chewable tablet, Chew 2 tablets by mouth as needed for heartburn., Disp: , Rfl:  .  carvedilol (COREG) 25 MG tablet, Take 1 tablet (25 mg total) by mouth 2 (two) times daily with a meal., Disp: 60 tablet, Rfl: 3 .  carvedilol (COREG) 25 MG tablet, TAKE 1 TABLET BY MOUTH TWICE DAILY WITH FOOD, Disp: 180 tablet, Rfl: 2 .  ciclopirox (LOPROX) 0.77 % cream, Apply topically 2 (two) times daily., Disp: 90 g, Rfl: prn .  clobetasol ointment (TEMOVATE) 8.46 %, Apply 1 application topically 2 (two) times daily., Disp: 30 g, Rfl: 0 .  clopidogrel (PLAVIX) 75 MG tablet, Take 75 mg by mouth daily., Disp: , Rfl:  .  diazepam (VALIUM) 10 MG tablet, ,  Disp: , Rfl: 0 .  JARDIANCE 25 MG TABS tablet, Take 25 mg by mouth daily. , Disp: , Rfl: 3 .  levofloxacin (LEVAQUIN) 750 MG tablet, , Disp: , Rfl: 0 .  Menthol, Topical Analgesic, (BIOFREEZE ROLL-ON EX), Apply 1 application topically as needed (arthritis)., Disp: , Rfl:  .  metFORMIN (GLUCOPHAGE-XR) 500 MG 24 hr tablet, Take 1,000 mg by mouth 2 (two) times daily., Disp: , Rfl: 5 .  naproxen sodium (ALEVE) 220 MG tablet, Take 220 mg by mouth as needed (pain)., Disp: , Rfl:  .  Prenatal Vit-Fe Fumarate-FA (PRENATAL MULTIVITAMIN) TABS tablet, Take 1 tablet by mouth daily at 12 noon., Disp: , Rfl:  .  ramipril (ALTACE) 10 MG capsule, Take 10 mg by mouth 2 (two) times daily. , Disp: , Rfl:  .  scopolamine (TRANSDERM-SCOP) 1 MG/3DAYS, , Disp: , Rfl:  .  terbinafine (LAMISIL) 250 MG tablet, Take 1 tablet (250 mg total) by mouth daily., Disp: 90 tablet, Rfl: 0 .  triamterene-hydrochlorothiazide (DYAZIDE) 37.5-25 MG capsule, Take 1 each (1 capsule total) by mouth every morning., Disp: 30 capsule, Rfl: 3 .  triamterene-hydrochlorothiazide (DYAZIDE) 37.5-25 MG capsule, TAKE 1 CAPSULE BY MOUTH EVERY MORNING, Disp: 90 capsule, Rfl: 2 .  TRULICITY 1.5 NG/2.9BM SOPN, Inject 1.5 mg into the skin once a week. ,  Disp: , Rfl: 3 .  atorvastatin (LIPITOR) 20 MG tablet, Take 1 tablet (20 mg total) by mouth daily. (Patient not taking: Reported on 11/22/2017), Disp: 90 tablet, Rfl: 3  Social History   Tobacco Use  Smoking Status Never Smoker  Smokeless Tobacco Never Used    No Known Allergies Objective:  There were no vitals filed for this visit. There is no height or weight on file to calculate BMI. Constitutional Well developed. Well nourished.  Vascular Dorsalis pedis pulses present 1+ bilaterally  Posterior tibial pulses absent bilaterally  Pedal hair growth diminished. Capillary refill normal to all digits.  No cyanosis or clubbing noted.  Neurologic Normal speech. Oriented to person, place, and  time. Epicritic sensation to light touch grossly present bilaterally. Protective sensation with 5.07 monofilament  present bilaterally. Vibratory sensation absent bilaterally.  Dermatologic Nails elongated, thickened, dystrophic. No open wounds. No skin lesions. Ingrown nail left great toe lateral border with slight local drainage and warmth no erythema  Orthopedic: Normal joint ROM without pain or crepitus bilaterally. No visible deformities. No bony tenderness.   Assessment:   1. Diabetes mellitus type 2 with peripheral artery disease ([place])   2. Onychomycosis   3. PTTD (posterior tibial tendon dysfunction)   4. Metatarsalgia of both feet   5. Capsulitis   6. Ingrown nail    Plan:  Patient was evaluated and treated and all questions answered.  Diabetes with PAD, Onychomycosis -Educated on diabetic footcare. Diabetic risk level 1 -Nails x10 debrided sharply and manually with large nail nipper and rotary burr.   Procedure: Nail Debridement Rationale: Patient meets criteria for routine foot care due to PAD Type of Debridement: manual, sharp debridement. Instrumentation: Nail nipper, rotary burr. Number of Nails: 9   Ingrown Nail, left -Patient elects to proceed with ingrown toenail removal today -Ingrown nail excised. See procedure note. -Educated on post-procedure care including soaking. Written instructions provided. -Patient to follow up in 2 weeks for nail check.  Procedure: Excision of Ingrown Toenail Location: Left 1st toe lateral nail borders. Anesthesia: Lidocaine 1% plain; 1.57mL and Marcaine 0.5% plain; 1.47mL, digital block. Skin Prep: Betadine. Dressing: Silvadene; telfa; dry, sterile, compression dressing. Technique: Following skin prep, the affected nail border was freed, split with a nail splitter, and excised.  The area was cleansed and sterile dressing applied. Disposition: Patient tolerated procedure well. Patient to return in 2 weeks for follow-up.     Pes planus, metatarsalgia, capsulitis -We will fabricate new custom orthotics.  Patient will please states that they are greatly alleviating his feet pain  Return in about 2 weeks (around 07/07/2018) for Nail Check.

## 2018-06-26 MED FILL — CARVEDILOL 25 MG TABLET: 25 | 90 days supply | Qty: 180 | Fill #1

## 2018-07-07 ENCOUNTER — Encounter: Payer: Self-pay | Admitting: Podiatry

## 2018-07-07 ENCOUNTER — Ambulatory Visit (INDEPENDENT_AMBULATORY_CARE_PROVIDER_SITE_OTHER): Payer: 59 | Admitting: Podiatry

## 2018-07-07 DIAGNOSIS — Z9889 Other specified postprocedural states: Secondary | ICD-10-CM

## 2018-07-22 NOTE — Progress Notes (Signed)
  Subjective:  Patient ID: Ryan Bombard, MD, male    DOB: 31-Jan-1951,  MRN: [id number]  Chief Complaint  Patient presents with  . Ingrown Toenail    Follow up nail procedure left hallux  "Its feels fine"   68 y.o. male returns for the above complaint.   Objective:   General AA&O x3. Normal mood and affect.  Vascular Foot warm and well perfused with good capillary refill.  Neurologic Sensation grossly intact.  Dermatologic Nail avulsion site healing well without drainage or erythema. Nail bed with overlying soft crust. Left intact. No signs of local infection.  Orthopedic: No tenderness to palpation of the toe.   Assessment & Plan:  Patient was evaluated and treated and all questions answered.  S/p Ingrown Toenail Excision, left -Healing well without issue. -Discussed return precautions. -F/u PRN

## 2018-08-09 MED FILL — CLOPIDOGREL 75 MG TABLET: 75 | 90 days supply | Qty: 90 | Fill #1

## 2018-08-09 MED FILL — ATORVASTATIN 40 MG TABLET: 40 | 90 days supply | Qty: 90 | Fill #1

## 2018-08-09 MED FILL — AMLODIPINE BESYLATE 5 MG TA: 5 | 90 days supply | Qty: 90 | Fill #1

## 2018-08-09 MED FILL — RAMIPRIL 10 MG CAPSULE: 10 | 90 days supply | Qty: 180 | Fill #1

## 2018-08-09 MED FILL — TRIAMTERENE/HCTZ 37.5/25 CP: 37.5-25 | 90 days supply | Qty: 90 | Fill #2

## 2018-08-09 MED FILL — metFORMIN HCL ER 500 MG TB2: 500 | 90 days supply | Qty: 360 | Fill #1

## 2018-08-28 MED FILL — TRULICITY 1.5 MG/0.5 ML PEN: 1.5 | 84 days supply | Qty: 6 | Fill #1

## 2018-08-28 MED FILL — CARVEDILOL 25 MG TABLET: 25 | 90 days supply | Qty: 180 | Fill #2

## 2018-08-29 DIAGNOSIS — G4733 Obstructive sleep apnea (adult) (pediatric): Secondary | ICD-10-CM | POA: Diagnosis not present

## 2018-10-06 ENCOUNTER — Ambulatory Visit: Payer: 59 | Admitting: Podiatry

## 2018-10-13 DIAGNOSIS — R972 Elevated prostate specific antigen [PSA]: Secondary | ICD-10-CM | POA: Diagnosis not present

## 2018-10-13 DIAGNOSIS — E119 Type 2 diabetes mellitus without complications: Secondary | ICD-10-CM | POA: Diagnosis not present

## 2018-10-13 DIAGNOSIS — I252 Old myocardial infarction: Secondary | ICD-10-CM | POA: Diagnosis not present

## 2018-10-13 DIAGNOSIS — Z9861 Coronary angioplasty status: Secondary | ICD-10-CM | POA: Diagnosis not present

## 2018-10-13 DIAGNOSIS — E785 Hyperlipidemia, unspecified: Secondary | ICD-10-CM | POA: Diagnosis not present

## 2018-10-13 DIAGNOSIS — I110 Hypertensive heart disease with heart failure: Secondary | ICD-10-CM | POA: Diagnosis not present

## 2018-10-13 DIAGNOSIS — Z1331 Encounter for screening for depression: Secondary | ICD-10-CM | POA: Diagnosis not present

## 2018-10-13 DIAGNOSIS — Z0000 Encounter for general adult medical examination without abnormal findings: Secondary | ICD-10-CM | POA: Diagnosis not present

## 2018-10-13 DIAGNOSIS — I5022 Chronic systolic (congestive) heart failure: Secondary | ICD-10-CM | POA: Diagnosis not present

## 2018-11-02 NOTE — Progress Notes (Signed)
Virtual Visit via Video Note   This visit type was conducted due to national recommendations for restrictions regarding the COVID-19 Pandemic (e.g. social distancing) in an effort to limit this patient's exposure and mitigate transmission in our community.  Due to his co-morbid illnesses, this patient is at least at moderate risk for complications without adequate follow up.  This format is felt to be most appropriate for this patient at this time.  All issues noted in this document were discussed and addressed.  A limited physical exam was performed with this format.  Please refer to the patient's chart for his consent to telehealth for [facility].   Date:  11/03/2018   ID:  Ryan Bombard, MD, DOB 09/04/1950, MRN [id number]  Patient Location: Home Provider Location: Home  PCP:  Marton Redwood, MD  Cardiologist:  No primary care provider on file.  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  Jardiance caused balanitis.  Last hemoglobin A1c was 7.5.  He asked about the keto diet.  I explained I do not have much information on the benefits or potential hazards from the standpoint of diabetes and coronary disease.  He has no cardiac complaints.  He is an active.  He denies chest pain or dyspnea.  Weight has been stable but still in the obese range.  No claudication or neurological complaints.  The patient does not have symptoms concerning for COVID-19 infection (fever, chills, cough, or new shortness of breath).    Past Medical History:  Diagnosis Date  . Chronic combined systolic and diastolic CHF (congestive heart failure) ([place])   . Coronary atherosclerosis of  native coronary artery   . Dental crowns present   . Essential hypertension, benign    states under control with meds., has been on med. x 15 yr.  . MI (myocardial infarction) ([place]) 10/27/2002  . Non-insulin dependent type 2 diabetes mellitus ([place])   . Obesity   . Osteoarthritis    hips and knees  . Pure hypercholesterolemia    Past Surgical History:  Procedure Laterality Date  . CARDIAC CATHETERIZATION  10/27/2002  . CARPAL TUNNEL RELEASE Right 11/21/2013  . CORONARY STENT PLACEMENT  10/27/2002   mid LAD  . TOTAL HIP ARTHROPLASTY Left 04/09/1999  . TOTAL HIP ARTHROPLASTY Right   . TOTAL KNEE ARTHROPLASTY Left 02/03/2011  . ULNAR TUNNEL RELEASE Left 06/18/2016   Procedure: LEFT CUBITAL TUNNEL RELEASE;  Surgeon: [name], MD;  Location: [facility];  Service: Orthopedics;  Laterality: Left;     Current Meds  Medication Sig  . amLODipine (NORVASC) 5 MG tablet TAKE 1 TABLET BY MOUTH ONCE DAILY  . atorvastatin (LIPITOR) 40 MG tablet Take 40 mg by mouth daily.  . calcium carbonate (TUMS EX) 750 MG chewable tablet Chew 2 tablets by mouth as needed for heartburn.  . carvedilol (COREG) 25 MG tablet Take 1 tablet (25 mg total) by mouth 2 (two) times daily with a meal.  . ciclopirox (LOPROX) 0.77 % cream Apply topically 2 (two) times daily.  . clopidogrel (PLAVIX) 75 MG tablet Take 75 mg by mouth daily.  . Menthol, Topical Analgesic, (BIOFREEZE ROLL-ON EX) Apply 1 application topically as needed (arthritis).  Marland Kitchen  metFORMIN (GLUCOPHAGE-XR) 500 MG 24 hr tablet Take 1,000 mg by mouth 2 (two) times daily.  . naproxen sodium (ALEVE) 220 MG tablet Take 220 mg by mouth as needed (pain).  . Prenatal Vit-Fe Fumarate-FA (PRENATAL MULTIVITAMIN) TABS tablet Take 1 tablet by mouth daily at 12 noon.  . ramipril (ALTACE) 10 MG capsule Take 10 mg by mouth 2 (two) times daily.   Marland Kitchen triamterene-hydrochlorothiazide (DYAZIDE) 37.5-25 MG capsule Take 1 each (1 capsule total) by mouth every  morning.  . TRULICITY 1.5 KP/5.4SF SOPN Inject 1.5 mg into the skin once a week.      Allergies:   Patient has no known allergies.   Social History   Tobacco Use  . Smoking status: Never Smoker  . Smokeless tobacco: Never Used  Substance Use Topics  . Alcohol use: Yes    Alcohol/week: 0.0 standard drinks    Comment: occasionally  . Drug use: No     Family Hx: The patient's family history includes Healthy in his father and mother.  ROS:   Please see the history of present illness.    Still has joint concerns, knees and hips.  Not exercising.  Not following diet as he should. All other systems reviewed and are negative.   Prior CV studies:   The following studies were reviewed today:  No recent cardiovascular evaluation.  Last stress test revealed an EF of 41%  Labs/Other Tests and Data Reviewed:    EKG:  No ECG reviewed.  Recent Labs: 11/22/2017: ALT 24; B Natriuretic Peptide 79.5; BUN 9; Creatinine, Ser 0.90; Hemoglobin 12.6; Magnesium 1.7; Platelets PLATELET CLUMPS NOTED ON SMEAR, UNABLE TO ESTIMATE; Potassium 4.1; Sodium 146   Recent Lipid Panel Lab Results  Component Value Date/Time   CHOL 190 07/29/2016 12:00 AM   TRIG 93 07/29/2016 12:00 AM   HDL 55 07/29/2016 12:00 AM   LDLCALC 116 (H) 07/29/2016 12:00 AM    Wt Readings from Last 3 Encounters:  11/03/18 230 lb (104.3 kg)  11/22/17 230 lb (104.3 kg)  10/21/17 225 lb (102.1 kg)     Objective:    Vital Signs:  BP 127/75   Pulse 90   Ht 5\' 5"  (1.651 m)   Wt 230 lb (104.3 kg)   BMI 38.27 kg/m    VITAL SIGNS:  reviewed GEN:  Obese CARDIOVASCULAR:  no peripheral edema  ASSESSMENT & PLAN:    1. Coronary artery disease involving native coronary artery of native heart with other form of angina pectoris ([place])   2. Chronic combined systolic and diastolic CHF (congestive heart failure) ([facility])   3. Other hyperlipidemia   4. Uncontrolled type 2 diabetes mellitus with hyperglycemia ([place])   5. Essential  hypertension, benign   6. Educated About Covid-19 Virus Infection    PLAN:  1. Secondary prevention discussed. 2. Guideline directed therapy discussed.  3. Target LDL 55 4. SGLT-2, Jardiance was tried but caused balanitis.  Last A1c was greater than 7.  I strongly encourage weight loss and moderate aerobic activity. 5. Target blood pressure is 130/80 mmHg.  Overall education and awareness concerning primary/secondary risk prevention was discussed in detail: LDL less than 70, hemoglobin A1c less than 7, blood pressure target less than 130/80 mmHg, >150 minutes of moderate aerobic activity per week, avoidance of smoking, weight control (via diet and exercise), and continued surveillance/management of/for obstructive sleep apnea.  Major areas to target for his individual risk reduction is sedentary lifestyle, diet, and weight.  Plan to see him back  in 1 year.  COVID-19 Education: The signs and symptoms of COVID-19 were discussed with the patient and how to seek care for testing (follow up with PCP or arrange E-visit).  The importance of social distancing was discussed today.  Time:   Today, I have spent 15 minutes with the patient with telehealth technology discussing the above problems.     Medication Adjustments/Labs and Tests Ordered: Current medicines are reviewed at length with the patient today.  Concerns regarding medicines are outlined above.   Tests Ordered: No orders of the defined types were placed in this encounter.   Medication Changes: No orders of the defined types were placed in this encounter.   Follow Up:  Virtual Visit in 1 year(s)  Signed, Sinclair Grooms, MD  11/03/2018 8:29 AM    [place]

## 2018-11-03 ENCOUNTER — Telehealth (INDEPENDENT_AMBULATORY_CARE_PROVIDER_SITE_OTHER): Payer: 59 | Admitting: Interventional Cardiology

## 2018-11-03 ENCOUNTER — Encounter: Payer: Self-pay | Admitting: Interventional Cardiology

## 2018-11-03 VITALS — BP 127/75 | HR 90 | Ht 65.0 in | Wt 230.0 lb

## 2018-11-03 DIAGNOSIS — Z7189 Other specified counseling: Secondary | ICD-10-CM

## 2018-11-03 DIAGNOSIS — E7849 Other hyperlipidemia: Secondary | ICD-10-CM

## 2018-11-03 DIAGNOSIS — E1165 Type 2 diabetes mellitus with hyperglycemia: Secondary | ICD-10-CM

## 2018-11-03 DIAGNOSIS — I5042 Chronic combined systolic (congestive) and diastolic (congestive) heart failure: Secondary | ICD-10-CM

## 2018-11-03 DIAGNOSIS — I25118 Atherosclerotic heart disease of native coronary artery with other forms of angina pectoris: Secondary | ICD-10-CM

## 2018-11-03 DIAGNOSIS — I10 Essential (primary) hypertension: Secondary | ICD-10-CM

## 2018-11-03 NOTE — Patient Instructions (Signed)
Medication Instructions:  Your physician recommends that you continue on your current medications as directed. Please refer to the Current Medication list given to you today.  If you need a refill on your cardiac medications before your next appointment, please call your pharmacy.   Lab work: None If you have labs (blood work) drawn today and your tests are completely normal, you will receive your results only by: Marland Kitchen MyChart Message (if you have MyChart) OR . A paper copy in the mail If you have any lab test that is abnormal or we need to change your treatment, we will call you to review the results.  Testing/Procedures: None  Follow-Up: At [facility], you and your health needs are our priority.  As part of our continuing mission to provide you with exceptional heart care, we have created designated Provider Care Teams.  These Care Teams include your primary Cardiologist (physician) and Advanced Practice Providers (APPs -  Physician Assistants and Nurse Practitioners) who all work together to provide you with the care you need, when you need it. You will need a follow up appointment in 12 months.  Please call our office 2 months in advance to schedule this appointment.  You may see [name] or one of the following Advanced Practice Providers on your designated Care Team:   Truitt Merle, NP Cecilie Kicks, NP . Kathyrn Drown, NP  Any Other Special Instructions Will Be Listed Below (If Applicable).  Your provider recommends that you maintain 150 minutes per week of moderate aerobic activity.

## 2018-11-13 ENCOUNTER — Other Ambulatory Visit: Payer: Self-pay | Admitting: Interventional Cardiology

## 2018-11-13 MED FILL — CARVEDILOL 25 MG TABLET: 25 | 90 days supply | Qty: 180 | Fill #0

## 2018-11-13 MED FILL — RAMIPRIL 10 MG CAPSULE: 10 | 90 days supply | Qty: 180 | Fill #2

## 2018-11-13 MED FILL — ATORVASTATIN 40 MG TABLET: 40 | 90 days supply | Qty: 90 | Fill #2

## 2018-11-13 MED FILL — TRIAMTERENE/HCTZ 37.5/25 CP: 37.5-25 | 90 days supply | Qty: 90 | Fill #0

## 2018-11-13 MED FILL — METFORMIN HCL ER 500 MG TB2: 500 | 30 days supply | Qty: 120 | Fill #2

## 2018-11-13 MED FILL — CLOPIDOGREL 75 MG TABLET: 75 | 90 days supply | Qty: 90 | Fill #2

## 2018-11-13 MED FILL — TRULICITY 1.5 MG/0.5 ML PEN: 1.5 | 84 days supply | Qty: 6 | Fill #2

## 2018-11-13 MED FILL — AMLODIPINE BESYLATE 5 MG TA: 5 | 90 days supply | Qty: 90 | Fill #0

## 2018-12-04 DIAGNOSIS — G4733 Obstructive sleep apnea (adult) (pediatric): Secondary | ICD-10-CM | POA: Diagnosis not present

## 2018-12-22 DIAGNOSIS — M545 Low back pain: Secondary | ICD-10-CM | POA: Diagnosis not present

## 2018-12-22 DIAGNOSIS — M25551 Pain in right hip: Secondary | ICD-10-CM | POA: Diagnosis not present

## 2018-12-22 DIAGNOSIS — Z96659 Presence of unspecified artificial knee joint: Secondary | ICD-10-CM | POA: Diagnosis not present

## 2018-12-22 DIAGNOSIS — M25561 Pain in right knee: Secondary | ICD-10-CM | POA: Diagnosis not present

## 2018-12-22 DIAGNOSIS — M25562 Pain in left knee: Secondary | ICD-10-CM | POA: Diagnosis not present

## 2018-12-22 MED FILL — HYDROCODON-APAP 5-325: 5-325 | 10 days supply | Qty: 20 | Fill #0

## 2018-12-22 MED FILL — METHOCARBAMOL 500 MG TABS: 500 | 10 days supply | Qty: 30 | Fill #0

## 2018-12-28 MED FILL — METFORMIN HCL ER 500 MG TB2: 500 | 30 days supply | Qty: 120 | Fill #3

## 2019-01-19 ENCOUNTER — Ambulatory Visit: Payer: 59 | Admitting: Podiatry

## 2019-01-22 MED FILL — metFORMIN HCL ER 500 MG TB2: 500 | 30 days supply | Qty: 120 | Fill #4

## 2019-02-16 MED FILL — TRULICITY 1.5 MG/0.5 ML PEN: 1.5 | 84 days supply | Qty: 6 | Fill #3

## 2019-02-16 MED FILL — CLOPIDOGREL 75 MG TABLET: 75 | 90 days supply | Qty: 90 | Fill #3

## 2019-02-16 MED FILL — RAMIPRIL 10 MG CAPSULE: 10 | 90 days supply | Qty: 180 | Fill #3

## 2019-02-16 MED FILL — AMLODIPINE BESYLATE 5 MG TA: 5 | 90 days supply | Qty: 90 | Fill #1

## 2019-02-16 MED FILL — CARVEDILOL 25 MG TABLET: 25 | 90 days supply | Qty: 180 | Fill #1

## 2019-02-16 MED FILL — ATORVASTATIN 40 MG TABLET: 40 | 90 days supply | Qty: 90 | Fill #3

## 2019-02-16 MED FILL — METFORMIN HCL ER 500 MG TB2: 500 | 30 days supply | Qty: 120 | Fill #5

## 2019-02-16 MED FILL — TRIAMTERENE/HCTZ 37.5/25 CP: 37.5-25 | 90 days supply | Qty: 90 | Fill #1

## 2019-03-06 DIAGNOSIS — G4733 Obstructive sleep apnea (adult) (pediatric): Secondary | ICD-10-CM | POA: Diagnosis not present

## 2019-03-23 ENCOUNTER — Ambulatory Visit: Payer: 59 | Admitting: Podiatry

## 2019-03-27 DIAGNOSIS — R972 Elevated prostate specific antigen [PSA]: Secondary | ICD-10-CM | POA: Diagnosis not present

## 2019-03-30 ENCOUNTER — Ambulatory Visit: Payer: 59 | Admitting: Podiatry

## 2019-04-03 DIAGNOSIS — E785 Hyperlipidemia, unspecified: Secondary | ICD-10-CM | POA: Diagnosis not present

## 2019-04-03 DIAGNOSIS — I10 Essential (primary) hypertension: Secondary | ICD-10-CM | POA: Diagnosis not present

## 2019-04-03 DIAGNOSIS — E119 Type 2 diabetes mellitus without complications: Secondary | ICD-10-CM | POA: Diagnosis not present

## 2019-04-03 DIAGNOSIS — D696 Thrombocytopenia, unspecified: Secondary | ICD-10-CM | POA: Diagnosis not present

## 2019-04-03 MED FILL — metFORMIN HCL ER 500 MG TB2: 500 | 30 days supply | Qty: 120 | Fill #6

## 2019-04-04 DIAGNOSIS — N3941 Urge incontinence: Secondary | ICD-10-CM | POA: Diagnosis not present

## 2019-04-04 DIAGNOSIS — N401 Enlarged prostate with lower urinary tract symptoms: Secondary | ICD-10-CM | POA: Diagnosis not present

## 2019-04-04 DIAGNOSIS — R972 Elevated prostate specific antigen [PSA]: Secondary | ICD-10-CM | POA: Diagnosis not present

## 2019-04-06 ENCOUNTER — Other Ambulatory Visit: Payer: Self-pay

## 2019-04-06 ENCOUNTER — Ambulatory Visit: Payer: 59 | Admitting: Podiatry

## 2019-04-06 ENCOUNTER — Encounter

## 2019-04-06 DIAGNOSIS — E1151 Type 2 diabetes mellitus with diabetic peripheral angiopathy without gangrene: Secondary | ICD-10-CM

## 2019-04-06 DIAGNOSIS — E119 Type 2 diabetes mellitus without complications: Secondary | ICD-10-CM | POA: Diagnosis not present

## 2019-04-06 DIAGNOSIS — R972 Elevated prostate specific antigen [PSA]: Secondary | ICD-10-CM | POA: Diagnosis not present

## 2019-04-06 DIAGNOSIS — E1169 Type 2 diabetes mellitus with other specified complication: Secondary | ICD-10-CM

## 2019-04-06 DIAGNOSIS — Z9861 Coronary angioplasty status: Secondary | ICD-10-CM | POA: Diagnosis not present

## 2019-04-06 DIAGNOSIS — M79676 Pain in unspecified toe(s): Secondary | ICD-10-CM

## 2019-04-06 DIAGNOSIS — I5022 Chronic systolic (congestive) heart failure: Secondary | ICD-10-CM | POA: Diagnosis not present

## 2019-04-06 DIAGNOSIS — B351 Tinea unguium: Secondary | ICD-10-CM

## 2019-04-06 DIAGNOSIS — M7742 Metatarsalgia, left foot: Secondary | ICD-10-CM

## 2019-04-06 DIAGNOSIS — M76829 Posterior tibial tendinitis, unspecified leg: Secondary | ICD-10-CM

## 2019-04-06 DIAGNOSIS — M7741 Metatarsalgia, right foot: Secondary | ICD-10-CM

## 2019-04-06 DIAGNOSIS — I110 Hypertensive heart disease with heart failure: Secondary | ICD-10-CM | POA: Diagnosis not present

## 2019-04-06 DIAGNOSIS — E785 Hyperlipidemia, unspecified: Secondary | ICD-10-CM | POA: Diagnosis not present

## 2019-04-30 ENCOUNTER — Other Ambulatory Visit: Payer: Self-pay | Admitting: Interventional Cardiology

## 2019-04-30 MED FILL — AMLODIPINE BESYLATE 5 MG TA: 5 | 90 days supply | Qty: 90 | Fill #0

## 2019-04-30 MED FILL — metFORMIN HCL ER 500 MG TB2: 500 | 30 days supply | Qty: 120 | Fill #7

## 2019-05-20 NOTE — Progress Notes (Signed)
Subjective:  Patient ID: Ryan Bombard, MD, male    DOB: 1950-08-20,  MRN: [id number]  Chief Complaint  Patient presents with  . Follow-up    nail trim , diabetic foot care.     68 y.o. male presents  for diabetic foot care.  States the left great toenail is painful again concerned it could be ingrown.  Review of Systems: Negative except as noted in the HPI. Denies N/V/F/Ch.  Past Medical History:  Diagnosis Date  . Chronic combined systolic and diastolic CHF (congestive heart failure) ([place])   . Coronary atherosclerosis of native coronary artery   . Dental crowns present   . Essential hypertension, benign    states under control with meds., has been on med. x 15 yr.  . MI (myocardial infarction) ([place]) 10/27/2002  . Non-insulin dependent type 2 diabetes mellitus ([place])   . Obesity   . Osteoarthritis    hips and knees  . Pure hypercholesterolemia     Current Outpatient Medications:  .  atorvastatin (LIPITOR) 40 MG tablet, Take 40 mg by mouth daily., Disp: , Rfl: 3 .  calcium carbonate (TUMS EX) 750 MG chewable tablet, Chew 2 tablets by mouth as needed for heartburn., Disp: , Rfl:  .  carvedilol (COREG) 25 MG tablet, TAKE 1 TABLET BY MOUTH TWICE DAILY WITH FOOD, Disp: 180 tablet, Rfl: 2 .  ciclopirox (LOPROX) 0.77 % cream, Apply topically 2 (two) times daily., Disp: 90 g, Rfl: prn .  clopidogrel (PLAVIX) 75 MG tablet, Take 75 mg by mouth daily., Disp: , Rfl:  .  Menthol, Topical Analgesic, (BIOFREEZE ROLL-ON EX), Apply 1 application topically as needed (arthritis)., Disp: , Rfl:  .  metFORMIN (GLUCOPHAGE-XR) 500 MG 24 hr tablet, Take 1,000 mg by mouth 2 (two) times daily., Disp: , Rfl: 5 .  naproxen sodium (ALEVE) 220 MG tablet, Take 220 mg by mouth as needed (pain)., Disp: , Rfl:  .  Prenatal Vit-Fe Fumarate-FA (PRENATAL MULTIVITAMIN) TABS tablet, Take 1 tablet by mouth daily at 12 noon., Disp: , Rfl:  .  ramipril (ALTACE) 10 MG capsule, Take 10 mg by mouth 2 (two) times daily.  , Disp: , Rfl:  .  triamterene-hydrochlorothiazide (DYAZIDE) 37.5-25 MG capsule, TAKE 1 CAPSULE BY MOUTH EVERY MORNING, Disp: 90 capsule, Rfl: 2 .  TRULICITY 1.5 0000000 SOPN, Inject 1.5 mg into the skin once a week. , Disp: , Rfl: 3 .  amLODipine (NORVASC) 5 MG tablet, TAKE 1 TABLET BY MOUTH ONCE DAILY, Disp: 90 tablet, Rfl: 1  Social History   Tobacco Use  Smoking Status Never Smoker  Smokeless Tobacco Never Used    No Known Allergies Objective:  There were no vitals filed for this visit. There is no height or weight on file to calculate BMI. Constitutional Well developed. Well nourished.  Vascular Dorsalis pedis pulses present 1+ bilaterally  Posterior tibial pulses absent bilaterally  Pedal hair growth diminished. Capillary refill normal to all digits.  No cyanosis or clubbing noted.  Neurologic Normal speech. Oriented to person, place, and time. Epicritic sensation to light touch grossly present bilaterally. Protective sensation with 5.07 monofilament  present bilaterally. Vibratory sensation absent bilaterally.  Dermatologic Nails elongated, thickened, dystrophic. No open wounds. No skin lesions. Ingrown nail left great toe lateral border  Orthopedic: Normal joint ROM without pain or crepitus bilaterally. No visible deformities. No bony tenderness.   Assessment:   1. Onychomycosis of multiple toenails with type 2 diabetes mellitus and peripheral angiopathy ([place])    Plan:  Patient was evaluated and treated and all questions answered.  Diabetes with PAD, Onychomycosis -Educated on diabetic footcare. Diabetic risk level 1 -Nails x10 debrided sharply and manually with large nail nipper and rotary burr.   Procedure: Nail Debridement Rationale: Patient meets criteria for routine foot care due to PAD Type of Debridement: manual, sharp debridement. Instrumentation: Nail nipper, rotary burr. Number of Nails: 10      Return in about 3 months (around 07/07/2019).

## 2019-05-23 MED FILL — RAMIPRIL 10 MG CAPSULE: 10 | 90 days supply | Qty: 180 | Fill #0

## 2019-05-23 MED FILL — TRULICITY 1.5 MG/0.5 ML PEN: 1.5 | 28 days supply | Qty: 2 | Fill #0

## 2019-05-23 MED FILL — TRIAMTERENE/HCTZ 37.5/25 CP: 37.5-25 | 90 days supply | Qty: 90 | Fill #2

## 2019-05-23 MED FILL — CLOPIDOGREL 75 MG TABLET: 75 | 90 days supply | Qty: 90 | Fill #0

## 2019-05-24 MED FILL — metFORMIN HCL ER 500 MG TB2: 500 | 90 days supply | Qty: 360 | Fill #0

## 2019-06-22 DIAGNOSIS — G4733 Obstructive sleep apnea (adult) (pediatric): Secondary | ICD-10-CM | POA: Diagnosis not present

## 2019-06-27 MED FILL — TRULICITY 1.5 MG/0.5 ML PEN: 1.5 | 28 days supply | Qty: 2 | Fill #1

## 2019-06-29 DIAGNOSIS — H25013 Cortical age-related cataract, bilateral: Secondary | ICD-10-CM | POA: Diagnosis not present

## 2019-06-29 DIAGNOSIS — H40013 Open angle with borderline findings, low risk, bilateral: Secondary | ICD-10-CM | POA: Diagnosis not present

## 2019-06-29 DIAGNOSIS — E119 Type 2 diabetes mellitus without complications: Secondary | ICD-10-CM | POA: Diagnosis not present

## 2019-06-29 DIAGNOSIS — H2513 Age-related nuclear cataract, bilateral: Secondary | ICD-10-CM | POA: Diagnosis not present

## 2019-06-29 LAB — HM DIABETES EYE EXAM

## 2019-07-04 MED FILL — CARVEDILOL 25 MG TABLET: 25 | 90 days supply | Qty: 180 | Fill #2

## 2019-07-20 ENCOUNTER — Ambulatory Visit: Payer: 59 | Admitting: Podiatry

## 2019-07-20 ENCOUNTER — Other Ambulatory Visit: Payer: Self-pay

## 2019-07-20 DIAGNOSIS — E1169 Type 2 diabetes mellitus with other specified complication: Secondary | ICD-10-CM

## 2019-07-20 DIAGNOSIS — B351 Tinea unguium: Secondary | ICD-10-CM

## 2019-07-20 DIAGNOSIS — E1151 Type 2 diabetes mellitus with diabetic peripheral angiopathy without gangrene: Secondary | ICD-10-CM

## 2019-07-20 NOTE — Progress Notes (Signed)
  Subjective:  Patient ID: Ryan Bombard, MD, male    DOB: 01/11/1951,  MRN: [id number]  Chief Complaint  Patient presents with  . Nail Problem    Right 2nd toenail painful at tip with pressure, several weeks duration, no known injuries and denies drainage.  . Nail Problem    Left 1st toenail is still painful.    69 y.o. male presents with the above complaint. History confirmed with patient.   Objective:  Physical Exam: warm, good capillary refill, nail exam onychomycosis of the toenails, no trophic changes or ulcerative lesions. DP pulses palpable and protective sensation intact. PT absent bilat. Right 2nd toe, 1st toe both borders POP Left Foot: normal exam, no swelling, tenderness, instability; ligaments intact, full range of motion of all ankle/foot joints  Right Foot: normal exam, no swelling, tenderness, instability; ligaments intact, full range of motion of all ankle/foot joints   No images are attached to the encounter.  Assessment:   1. Onychomycosis of multiple toenails with type 2 diabetes mellitus and peripheral angiopathy ([place])      Plan:  Patient was evaluated and treated and all questions answered.  Onychomycosis, Diabetes and PAD -Patient is diabetic with a qualifying condition for at risk foot care.  Procedure: Nail Debridement Rationale: Patient meets criteria for routine foot care due to PAD Type of Debridement: manual, sharp debridement. Instrumentation: Nail nipper, rotary burr. Number of Nails: 10  Return in about 9 weeks (around 09/21/2019) for Diabetic Foot Care.

## 2019-07-20 NOTE — Patient Instructions (Signed)

## 2019-08-07 MED FILL — TRULICITY 1.5 MG/0.5 ML PEN: 1.5 | 28 days supply | Qty: 2 | Fill #2

## 2019-08-07 MED FILL — AMLODIPINE BESYLATE 5 MG TA: 5 | 90 days supply | Qty: 90 | Fill #1

## 2019-08-24 ENCOUNTER — Other Ambulatory Visit: Payer: Self-pay | Admitting: *Deleted

## 2019-08-28 ENCOUNTER — Other Ambulatory Visit: Payer: Self-pay | Admitting: Interventional Cardiology

## 2019-08-28 MED FILL — RAMIPRIL 10 MG CAPSULE: 10 | 90 days supply | Qty: 180 | Fill #1

## 2019-08-28 MED FILL — ATORVASTATIN 40 MG TABLET: 40 | 90 days supply | Qty: 90 | Fill #0

## 2019-08-28 MED FILL — metFORMIN HCL ER 500 MG TB2: 500 | 90 days supply | Qty: 360 | Fill #1

## 2019-08-28 MED FILL — CLOPIDOGREL 75 MG TABLET: 75 | 90 days supply | Qty: 90 | Fill #1

## 2019-08-29 MED FILL — TRIAMTERENE/HCTZ 37.5/25 CP: 37.5-25 | 90 days supply | Qty: 90 | Fill #0

## 2019-09-03 MED FILL — TRULICITY 1.5 MG/0.5 ML PEN: 1.5 | 28 days supply | Qty: 2 | Fill #3

## 2019-09-21 ENCOUNTER — Ambulatory Visit: Payer: 59 | Admitting: Podiatry

## 2019-09-27 DIAGNOSIS — G4733 Obstructive sleep apnea (adult) (pediatric): Secondary | ICD-10-CM | POA: Diagnosis not present

## 2019-09-27 MED FILL — TRULICITY 1.5 MG/0.5 ML PEN: 1.5 | 28 days supply | Qty: 2 | Fill #4

## 2019-09-28 ENCOUNTER — Other Ambulatory Visit: Payer: Self-pay

## 2019-09-28 ENCOUNTER — Ambulatory Visit: Payer: 59 | Admitting: Podiatry

## 2019-09-28 DIAGNOSIS — L600 Ingrowing nail: Secondary | ICD-10-CM

## 2019-09-28 DIAGNOSIS — M79676 Pain in unspecified toe(s): Secondary | ICD-10-CM

## 2019-09-28 MED ORDER — NEOMYCIN-POLYMYXIN-HC 3.5-10000-1 OT SOLN: OTIC | 0 refills | Status: DC

## 2019-09-28 MED FILL — NEO/POLYMYXIN/HC EAR SOLN: 3.5-10000-1 | 50 days supply | Qty: 10 | Fill #0

## 2019-09-28 NOTE — Patient Instructions (Signed)

## 2019-10-12 ENCOUNTER — Ambulatory Visit (INDEPENDENT_AMBULATORY_CARE_PROVIDER_SITE_OTHER): Payer: 59 | Admitting: Podiatry

## 2019-10-12 ENCOUNTER — Other Ambulatory Visit: Payer: Self-pay

## 2019-10-12 DIAGNOSIS — M79676 Pain in unspecified toe(s): Secondary | ICD-10-CM | POA: Diagnosis not present

## 2019-10-12 DIAGNOSIS — L600 Ingrowing nail: Secondary | ICD-10-CM | POA: Diagnosis not present

## 2019-10-14 NOTE — Progress Notes (Signed)
  Subjective:  Patient ID: Ryan Bombard, MD, male    DOB: 1950/09/22,  MRN: [id number]  Chief Complaint  Patient presents with  . Ingrown Toenail    2 week nail check    69 y.o. male presents for follow up of nail procedure. History confirmed with patient.  Objective:  Physical Exam: Ingrown nail avulsion site: overlying soft crust, no warmth, no drainage and no erythema Assessment:   1. Ingrown nail   2. Pain around toenail      Plan:  Patient was evaluated and treated and all questions answered.  S/p Ingrown Toenail Excision, right -Healing well without issue. -Left hallux nail gently debrided. -Discussed return precautions. -F/u PRN

## 2019-10-23 ENCOUNTER — Other Ambulatory Visit: Payer: Self-pay | Admitting: Interventional Cardiology

## 2019-10-23 MED FILL — TRULICITY 1.5 MG/0.5 ML PEN: 1.5 | 84 days supply | Qty: 6 | Fill #0

## 2019-10-24 MED FILL — CARVEDILOL 25 MG TABLET: 25 | 30 days supply | Qty: 60 | Fill #0

## 2019-10-26 DIAGNOSIS — E119 Type 2 diabetes mellitus without complications: Secondary | ICD-10-CM | POA: Diagnosis not present

## 2019-10-26 DIAGNOSIS — R972 Elevated prostate specific antigen [PSA]: Secondary | ICD-10-CM | POA: Diagnosis not present

## 2019-10-26 DIAGNOSIS — D696 Thrombocytopenia, unspecified: Secondary | ICD-10-CM | POA: Diagnosis not present

## 2019-10-26 DIAGNOSIS — Z0000 Encounter for general adult medical examination without abnormal findings: Secondary | ICD-10-CM | POA: Diagnosis not present

## 2019-10-26 DIAGNOSIS — E785 Hyperlipidemia, unspecified: Secondary | ICD-10-CM | POA: Diagnosis not present

## 2019-11-01 ENCOUNTER — Other Ambulatory Visit (HOSPITAL_COMMUNITY): Payer: Self-pay | Admitting: Internal Medicine

## 2019-11-01 DIAGNOSIS — I5022 Chronic systolic (congestive) heart failure: Secondary | ICD-10-CM | POA: Diagnosis not present

## 2019-11-01 DIAGNOSIS — Z9861 Coronary angioplasty status: Secondary | ICD-10-CM | POA: Diagnosis not present

## 2019-11-01 DIAGNOSIS — D696 Thrombocytopenia, unspecified: Secondary | ICD-10-CM | POA: Diagnosis not present

## 2019-11-01 DIAGNOSIS — Z1331 Encounter for screening for depression: Secondary | ICD-10-CM | POA: Diagnosis not present

## 2019-11-01 DIAGNOSIS — E119 Type 2 diabetes mellitus without complications: Secondary | ICD-10-CM | POA: Diagnosis not present

## 2019-11-01 DIAGNOSIS — I110 Hypertensive heart disease with heart failure: Secondary | ICD-10-CM | POA: Diagnosis not present

## 2019-11-01 DIAGNOSIS — I10 Essential (primary) hypertension: Secondary | ICD-10-CM | POA: Diagnosis not present

## 2019-11-01 DIAGNOSIS — E785 Hyperlipidemia, unspecified: Secondary | ICD-10-CM | POA: Diagnosis not present

## 2019-11-01 DIAGNOSIS — I252 Old myocardial infarction: Secondary | ICD-10-CM | POA: Diagnosis not present

## 2019-11-01 DIAGNOSIS — Z0000 Encounter for general adult medical examination without abnormal findings: Secondary | ICD-10-CM | POA: Diagnosis not present

## 2019-11-08 NOTE — Progress Notes (Signed)
Cardiology Office Note:    Date:  11/16/2019   ID:  Ryan Bombard, MD, DOB [birth year], MRN [id number]  PCP:  Marton Redwood, MD  Cardiologist:  Sinclair Grooms, MD   Referring MD: Marton Redwood, MD   Chief Complaint  Patient presents with  . Coronary Artery Disease  . 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  [name] is doing relatively well.  He readily admits that he has exertional intolerance that is getting to a point that it is a quality of life issue.  There is no exertional related chest pain.  He also does not complain of orthopnea or PND.  Simply states that he gets tired with minimal activity.  He is taking medications as prescribed.  Not sleeping well.  This is been an issue for quite some time.  He has sleep apnea.  No recent adjustments in sleep prescription relative to CPAP dosing.  No significant lower extremity swelling.  Wear support stockings.  He has not had palpitations or syncope.  Past Medical History:  Diagnosis Date  . Chronic combined systolic and diastolic CHF (congestive heart failure) ([place])   . Coronary atherosclerosis of native coronary artery   . Dental crowns present   . Essential hypertension, benign    states under control with meds., has been on med. x 15 yr.  . MI (myocardial infarction) ([place]) 10/27/2002  . Non-insulin dependent type 2 diabetes mellitus ([place])   . Obesity   . Osteoarthritis    hips and knees  . Pure hypercholesterolemia     Past Surgical History:  Procedure Laterality Date  . CARDIAC CATHETERIZATION  10/27/2002  . CARPAL TUNNEL RELEASE Right 11/21/2013  . CORONARY STENT PLACEMENT  10/27/2002   mid LAD  . TOTAL HIP ARTHROPLASTY Left 04/09/1999  . TOTAL HIP  ARTHROPLASTY Right   . TOTAL KNEE ARTHROPLASTY Left 02/03/2011  . ULNAR TUNNEL RELEASE Left 06/18/2016   Procedure: LEFT CUBITAL TUNNEL RELEASE;  Surgeon: [name], MD;  Location: [facility];  Service: Orthopedics;  Laterality: Left;    Current Medications: Current Meds  Medication Sig  . amLODipine (NORVASC) 5 MG tablet TAKE 1 TABLET BY MOUTH ONCE DAILY  . atorvastatin (LIPITOR) 40 MG tablet Take 40 mg by mouth daily.  . calcium carbonate (TUMS EX) 750 MG chewable tablet Chew 2 tablets by mouth as needed for heartburn.  . carvedilol (COREG) 25 MG tablet Take 1 tablet (25 mg total) by mouth 2 (two) times daily with a meal. Please make overdue appt with [name] before anymore refills. 1st attempt  . ciclopirox (LOPROX) 0.77 % cream Apply topically 2 (two) times daily.  . clopidogrel (PLAVIX) 75 MG tablet Take 75 mg by mouth daily.  . diazepam (VALIUM) 10 MG tablet diazepam 10 mg tablet  . HYDROcodone-acetaminophen (NORCO) 5-325 MG tablet Norco 5 mg-325 mg tablet  1-2 po qhs prn pain  . Menthol, Topical Analgesic, (BIOFREEZE ROLL-ON EX) Apply 1 application topically as needed (arthritis).  . metFORMIN (GLUCOPHAGE-XR) 500 MG 24 hr tablet Take 1,000 mg by mouth 2 (two) times daily.  . methocarbamol (ROBAXIN) 500 MG tablet methocarbamol 500 mg tablet  1 po tid prn spasm  . naproxen sodium (ALEVE) 220 MG tablet  Take 220 mg by mouth as needed (pain).  Marland Kitchen neomycin-polymyxin-hydrocortisone (CORTISPORIN) OTIC solution Apply 1 to 2 drops to toe BID after soaking  . Prenatal Vit-Fe Fumarate-FA (PRENATAL MULTIVITAMIN) TABS tablet Take 1 tablet by mouth daily at 12 noon.  . ramipril (ALTACE) 10 MG capsule Take 10 mg by mouth 2 (two) times daily.   Marland Kitchen triamterene-hydrochlorothiazide (DYAZIDE) 37.5-25 MG capsule Take 1 each (1 capsule total) by mouth every morning. Please keep upcoming appt with [name] in June for future refills. thankyou  . TRULICITY 1.5 ZO/1.0RU SOPN Inject 1.5  mg into the skin once a week.      Allergies:   No known allergies   Social History   Socioeconomic History  . Marital status: Married    Spouse name: Not on file  . Number of children: Not on file  . Years of education: Not on file  . Highest education level: Not on file  Occupational History  . Not on file  Tobacco Use  . Smoking status: Never Smoker  . Smokeless tobacco: Never Used  Substance and Sexual Activity  . Alcohol use: Yes    Alcohol/week: 0.0 standard drinks    Comment: occasionally  . Drug use: No  . Sexual activity: Not on file  Other Topics Concern  . Not on file  Social History Narrative  . Not on file   Social Determinants of Health   Financial Resource Strain:   . Difficulty of Paying Living Expenses:   Food Insecurity:   . Worried About Charity fundraiser in the Last Year:   . Arboriculturist in the Last Year:   Transportation Needs:   . Film/video editor (Medical):   Marland Kitchen Lack of Transportation (Non-Medical):   Physical Activity:   . Days of Exercise per Week:   . Minutes of Exercise per Session:   Stress:   . Feeling of Stress :   Social Connections:   . Frequency of Communication with Friends and Family:   . Frequency of Social Gatherings with Friends and Family:   . Attends Religious Services:   . Active Member of Clubs or Organizations:   . Attends Archivist Meetings:   Marland Kitchen Marital Status:      Family History: The patient's family history includes Healthy in his father and mother.  ROS:   Please see the history of present illness.    Gaining weight.  Unable to lose weight.  Not exercising.  Diabetes is severely out-of-control with hemoglobin A1c 10.4 in June.  LDL eighty-four.  Total cholesterol was one forty-three.  Creatinine 1.88.  All other systems reviewed and are negative.  EKGs/Labs/Other Studies Reviewed:    The following studies were reviewed today: No new data.  Last EF was 41% on nuclear  scintigraphy.  EKG:  EKG sinus rhythm, anterior, apical, and inferior Q waves consistent with prior infarction.  Incomplete right bundle branch block.  There is no change when compared to July 2019.  Recent Labs: No results found for requested labs within last 8760 hours.  Recent Lipid Panel    Component Value Date/Time   CHOL 190 07/29/2016 0000   TRIG 93 07/29/2016 0000   HDL 55 07/29/2016 0000   LDLCALC 116 (H) 07/29/2016 0000    Physical Exam:    VS:  BP 104/64   Pulse 97   Ht 5\' 5"  (1.651 m)   Wt 224 lb (101.6 kg)   SpO2 96%   BMI 37.28  kg/m     Wt Readings from Last 3 Encounters:  11/16/19 224 lb (101.6 kg)  11/03/18 230 lb (104.3 kg)  11/22/17 230 lb (104.3 kg)     GEN: Morbidly obese. No acute distress HEENT: Normal NECK: No JVD. LYMPHATICS: No lymphadenopathy CARDIAC:  RRR without murmur, gallop, with trace ankle edema. VASCULAR:  Normal Pulses. No bruits. RESPIRATORY:  Clear to auscultation without rales, wheezing or rhonchi  ABDOMEN: Soft, non-tender, non-distended, No pulsatile mass, MUSCULOSKELETAL: No deformity  SKIN: Warm and dry NEUROLOGIC:  Alert and oriented x 3 PSYCHIATRIC:  Normal affect   ASSESSMENT:    1. Coronary artery disease involving native coronary artery of native heart with other form of angina pectoris ([place])   2. Chronic combined systolic and diastolic CHF (congestive heart failure) ([facility])   3. Other hyperlipidemia   4. Uncontrolled type 2 diabetes mellitus with hyperglycemia ([place])   5. Essential hypertension, benign    PLAN:    In order of problems listed above:  1. Secondary prevention discussed.  Encouraging exercise and weight loss. 2. He has tried Jardiance in the past and had yeast infection.  I would recommend trying low-dose Farxiga, 5 mg/day.  We discussed the four pillars of heart failure therapy.  These include beta-blocker therapy, angiotensin/renin blockade, mineralocorticoid antagonist therapy, and SGLT2 therapy.  We  will do an echocardiogram to reassess LV function and make further recommendations.  I discussed that he may need to have Entresto substituted for Altace.  I am concerned that exertional fatigue could be related to systolic decompensation. 3. Continue statin therapy and LDL target less than seventy.  He is currently less than or equal to around 50 mg/dL. 4. Add Farxiga to help with sugar control at heart failure.  Increase physical activity and alter diet decreasing carbohydrate intake. 5. Blood pressure is relatively low.  Will monitor.  Guideline directed therapy for left ventricular systolic dysfunction: Angiotensin receptor-neprilysin inhibitor (ARNI)-Entresto; beta-blocker therapy - carvedilol or metoprolol succinate; mineralocorticoid receptor antagonist (MRA) therapy -spironolactone or eplerenone.  These therapies have been shown to improve clinical outcomes including reduction of rehospitalization survival, and acute heart failure.  SGLT2 therapy is indicated if he can tolerate.  1 year follow-up   Medication Adjustments/Labs and Tests Ordered: Current medicines are reviewed at length with the patient today.  Concerns regarding medicines are outlined above.  Orders Placed This Encounter  Procedures  . EKG 12-Lead  . ECHOCARDIOGRAM COMPLETE   No orders of the defined types were placed in this encounter.   Patient Instructions  Medication Instructions:  Your physician recommends that you continue on your current medications as directed. Please refer to the Current Medication list given to you today.  *If you need a refill on your cardiac medications before your next appointment, please call your pharmacy*   Lab Work: None If you have labs (blood work) drawn today and your tests are completely normal, you will receive your results only by: Marland Kitchen MyChart Message (if you have MyChart) OR . A paper copy in the mail If you have any lab test that is abnormal or we need to change your  treatment, we will call you to review the results.   Testing/Procedures: Your physician has requested that you have an echocardiogram anytime before January. Echocardiography is a painless test that uses sound waves to create images of your heart. It provides your doctor with information about the size and shape of your heart and how well your heart's chambers and valves are  working. This procedure takes approximately one hour. There are no restrictions for this procedure.    Follow-Up: At [facility], you and your health needs are our priority.  As part of our continuing mission to provide you with exceptional heart care, we have created designated Provider Care Teams.  These Care Teams include your primary Cardiologist (physician) and Advanced Practice Providers (APPs -  Physician Assistants and Nurse Practitioners) who all work together to provide you with the care you need, when you need it.  We recommend signing up for the patient portal called "MyChart".  Sign up information is provided on this After Visit Summary.  MyChart is used to connect with patients for Virtual Visits (Telemedicine).  Patients are able to view lab/test results, encounter notes, upcoming appointments, etc.  Non-urgent messages can be sent to your provider as well.   To learn more about what you can do with MyChart, go to [email or web address].    Your next appointment:   12 month(s)  The format for your next appointment:   In Person  Provider:   You may see Sinclair Grooms, MD or one of the following Advanced Practice Providers on your designated Care Team:    Truitt Merle, NP  Cecilie Kicks, NP  Kathyrn Drown, NP    Other Instructions  Talk to your Primary Care Physician about Wilder Glade.      Signed, Sinclair Grooms, MD  11/16/2019 9:06 AM    [place]

## 2019-11-16 ENCOUNTER — Encounter: Payer: Self-pay | Admitting: Interventional Cardiology

## 2019-11-16 ENCOUNTER — Ambulatory Visit: Payer: 59 | Admitting: Interventional Cardiology

## 2019-11-16 ENCOUNTER — Other Ambulatory Visit: Payer: Self-pay

## 2019-11-16 VITALS — BP 104/64 | HR 97 | Ht 65.0 in | Wt 224.0 lb

## 2019-11-16 DIAGNOSIS — I25118 Atherosclerotic heart disease of native coronary artery with other forms of angina pectoris: Secondary | ICD-10-CM | POA: Diagnosis not present

## 2019-11-16 DIAGNOSIS — E7849 Other hyperlipidemia: Secondary | ICD-10-CM | POA: Diagnosis not present

## 2019-11-16 DIAGNOSIS — I5042 Chronic combined systolic (congestive) and diastolic (congestive) heart failure: Secondary | ICD-10-CM

## 2019-11-16 DIAGNOSIS — I10 Essential (primary) hypertension: Secondary | ICD-10-CM | POA: Diagnosis not present

## 2019-11-16 DIAGNOSIS — E1165 Type 2 diabetes mellitus with hyperglycemia: Secondary | ICD-10-CM

## 2019-11-16 NOTE — Patient Instructions (Signed)
Medication Instructions:  Your physician recommends that you continue on your current medications as directed. Please refer to the Current Medication list given to you today.  *If you need a refill on your cardiac medications before your next appointment, please call your pharmacy*   Lab Work: None If you have labs (blood work) drawn today and your tests are completely normal, you will receive your results only by: Marland Kitchen MyChart Message (if you have MyChart) OR . A paper copy in the mail If you have any lab test that is abnormal or we need to change your treatment, we will call you to review the results.   Testing/Procedures: Your physician has requested that you have an echocardiogram anytime before January. Echocardiography is a painless test that uses sound waves to create images of your heart. It provides your doctor with information about the size and shape of your heart and how well your heart's chambers and valves are working. This procedure takes approximately one hour. There are no restrictions for this procedure.    Follow-Up: At [facility], you and your health needs are our priority.  As part of our continuing mission to provide you with exceptional heart care, we have created designated Provider Care Teams.  These Care Teams include your primary Cardiologist (physician) and Advanced Practice Providers (APPs -  Physician Assistants and Nurse Practitioners) who all work together to provide you with the care you need, when you need it.  We recommend signing up for the patient portal called "MyChart".  Sign up information is provided on this After Visit Summary.  MyChart is used to connect with patients for Virtual Visits (Telemedicine).  Patients are able to view lab/test results, encounter notes, upcoming appointments, etc.  Non-urgent messages can be sent to your provider as well.   To learn more about what you can do with MyChart, go to [email or web address].    Your next  appointment:   12 month(s)  The format for your next appointment:   In Person  Provider:   You may see Sinclair Grooms, MD or one of the following Advanced Practice Providers on your designated Care Team:    Truitt Merle, NP  Cecilie Kicks, NP  Kathyrn Drown, NP    Other Instructions  Talk to your Primary Care Physician about Wilder Glade.

## 2019-11-29 ENCOUNTER — Other Ambulatory Visit: Payer: Self-pay | Admitting: Interventional Cardiology

## 2019-11-29 MED FILL — CARVEDILOL 25 MG TABLET: 25 | 90 days supply | Qty: 180 | Fill #0

## 2019-11-29 MED FILL — ATORVASTATIN CALCIUM 40 MG: 40 | 90 days supply | Qty: 90 | Fill #1

## 2019-11-29 MED FILL — metFORMIN HCL ER 500 MG TB2: 500 | 90 days supply | Qty: 360 | Fill #2

## 2019-11-29 MED FILL — CLOPIDOGREL 75 MG TABLET: 75 | 90 days supply | Qty: 90 | Fill #2

## 2019-11-29 MED FILL — AMLODIPINE BESYLATE 5 MG TA: 5 | 90 days supply | Qty: 90 | Fill #0

## 2019-11-29 MED FILL — RAMIPRIL 10 MG CAPSULE: 10 | 90 days supply | Qty: 180 | Fill #2

## 2019-11-29 NOTE — Telephone Encounter (Signed)
Pt's medications were sent to pt's pharmacy as requested. Confirmation received.  

## 2019-11-30 ENCOUNTER — Encounter: Payer: Self-pay | Admitting: Podiatry

## 2019-11-30 ENCOUNTER — Ambulatory Visit (INDEPENDENT_AMBULATORY_CARE_PROVIDER_SITE_OTHER): Payer: 59 | Admitting: Podiatry

## 2019-11-30 ENCOUNTER — Other Ambulatory Visit: Payer: Self-pay

## 2019-11-30 DIAGNOSIS — L600 Ingrowing nail: Secondary | ICD-10-CM

## 2019-11-30 DIAGNOSIS — M79676 Pain in unspecified toe(s): Secondary | ICD-10-CM | POA: Diagnosis not present

## 2019-12-01 NOTE — Progress Notes (Signed)
  Subjective:  Patient ID: [name], MD, male    DOB: [birth year],  MRN: [id number]  Chief Complaint  Patient presents with  . Ingrown Toenail    left tip of great toe is sore, tender to touch since last visit    69 y.o. male presents with the above complaint. History confirmed with patient.   Objective:  Physical Exam: warm, good capillary refill, no trophic changes or ulcerative lesions, normal DP and PT pulses and normal sensory exam.  Painful ingrowing nail at  medial border of the left, hallux; without warmth, erythema or drainage  Assessment:   1. Ingrown nail   2. Pain around toenail      Plan:  Patient was evaluated and treated and all questions answered.  Ingrown Nail, left -Nail gently debrided in slant back fashion.  Other nails gently trimmed. -Follow-up as scheduled for at risk foot care No follow-ups on file.   MDM

## 2019-12-03 ENCOUNTER — Other Ambulatory Visit: Payer: Self-pay | Admitting: Interventional Cardiology

## 2019-12-04 ENCOUNTER — Other Ambulatory Visit: Payer: Self-pay | Admitting: Interventional Cardiology

## 2019-12-04 MED FILL — TRIAMTERENE/HCTZ 37.5/25 CP: 37.5-25 | 90 days supply | Qty: 90 | Fill #0

## 2019-12-05 NOTE — Progress Notes (Signed)
  Subjective:  Patient ID: Ryan Bombard, MD, male    DOB: 04-13-1951,  MRN: [id number]  Chief Complaint  Patient presents with  . Nail Problem    Bilateral nail trim 1-5  . Nail Problem    Bilateral 1st toenails medial aspect painful 3 week duration. Denies any drainage.    69 y.o. male presents with the above complaint. History confirmed with patient.   Objective:  Physical Exam: warm, good capillary refill, nail exam onychomycosis of the toenails, no trophic changes or ulcerative lesions. DP pulses palpable and protective sensation intact. PT absent bilat. Right 2nd toe, 1st toe both borders POP Left Foot: normal exam, no swelling, tenderness, instability; ligaments intact, full range of motion of all ankle/foot joints  Right Foot: normal exam, no swelling, tenderness, instability; ligaments intact, full range of motion of all ankle/foot joints. Right 1st toe medial nail border ingrown noted.  No images are attached to the encounter.  Assessment:   1. Ingrown nail   2. Pain around toenail      Plan:  Patient was evaluated and treated and all questions answered.  Ingrown Nail, right -Patient elects to proceed with ingrown toenail removal today -Ingrown nail excised. See procedure note. -Educated on post-procedure care including soaking. Written instructions provided. -Other nails debrided to patient relief. -Patient to follow up in 2 weeks for nail check.  Procedure: Excision of Ingrown Toenail Location: Right 1st toe medial nail borders. Anesthesia: Lidocaine 1% plain; 1.45mL and Marcaine 0.5% plain; 1.13mL, digital block. Skin Prep: Alcohol. Dressing: Silvadene; telfa; dry, sterile, compression dressing. Technique: Following skin prep, the toe was exsanguinated and a tourniquet was secured at the base of the toe. The affected nail border was freed, split with a nail splitter, and excised. Chemical matrixectomy was then performed with phenol and irrigated out with  alcohol. The tourniquet was then removed and sterile dressing applied. Disposition: Patient tolerated procedure well. Patient to return in 2 weeks for follow-up.       No follow-ups on file.

## 2019-12-06 ENCOUNTER — Ambulatory Visit: Payer: 59 | Admitting: Podiatry

## 2019-12-07 ENCOUNTER — Other Ambulatory Visit (HOSPITAL_COMMUNITY): Payer: 59

## 2019-12-13 DIAGNOSIS — M25552 Pain in left hip: Secondary | ICD-10-CM | POA: Diagnosis not present

## 2019-12-13 DIAGNOSIS — M1711 Unilateral primary osteoarthritis, right knee: Secondary | ICD-10-CM | POA: Diagnosis not present

## 2019-12-13 DIAGNOSIS — Z96643 Presence of artificial hip joint, bilateral: Secondary | ICD-10-CM | POA: Diagnosis not present

## 2019-12-14 ENCOUNTER — Ambulatory Visit (INDEPENDENT_AMBULATORY_CARE_PROVIDER_SITE_OTHER): Payer: 59 | Admitting: Podiatry

## 2019-12-14 ENCOUNTER — Other Ambulatory Visit: Payer: Self-pay

## 2019-12-14 ENCOUNTER — Encounter: Payer: Self-pay | Admitting: Podiatry

## 2019-12-14 ENCOUNTER — Other Ambulatory Visit (HOSPITAL_COMMUNITY): Payer: 59

## 2019-12-14 DIAGNOSIS — M79676 Pain in unspecified toe(s): Secondary | ICD-10-CM | POA: Diagnosis not present

## 2019-12-14 DIAGNOSIS — L600 Ingrowing nail: Secondary | ICD-10-CM

## 2019-12-14 NOTE — Progress Notes (Signed)
  Subjective:  Patient ID: Ryan Bombard, MD, male    DOB: 10-Oct-1950,  MRN: [id number]  Chief Complaint  Patient presents with  . Nail Problem    the left big toenail is doing alot better and i have not had to wrap it up     69 y.o. male presents for follow up of nail procedure. History confirmed with patient.   Objective:  Physical Exam: Ingrown nail avulsion site: overlying soft crust, no warmth, no drainage and no erythema Assessment:   1. Ingrown nail   2. Pain around toenail      Plan:  Patient was evaluated and treated and all questions answered.  S/p Ingrown Toenail Excision, left -Healing well without issue. -Discussed return precautions. -F/u in 9 weeks for routine foot care

## 2019-12-26 MED FILL — TRULICITY 3 MG/0.5ML SOPN: 3 | 28 days supply | Qty: 2 | Fill #0

## 2020-01-15 ENCOUNTER — Other Ambulatory Visit: Payer: Self-pay

## 2020-01-15 ENCOUNTER — Ambulatory Visit: Payer: 59 | Admitting: Podiatry

## 2020-01-15 ENCOUNTER — Encounter: Payer: Self-pay | Admitting: Podiatry

## 2020-01-15 DIAGNOSIS — L600 Ingrowing nail: Secondary | ICD-10-CM

## 2020-01-15 DIAGNOSIS — M79676 Pain in unspecified toe(s): Secondary | ICD-10-CM

## 2020-01-15 MED ORDER — NEOMYCIN-POLYMYXIN-HC 3.5-10000-1 OT SOLN
OTIC | 0 refills | Status: DC
Start: 2020-01-15 — End: 2021-04-09

## 2020-01-15 NOTE — Progress Notes (Signed)
°  Subjective:  Patient ID: Ryan Bombard, MD, male    DOB: 09/27/1950,  MRN: [id number]  Chief Complaint  Patient presents with   Toe Pain    Hallux left - lateral border - tender x 1 week, started noticing redness, swelling and darkness x few days, worked on medial border last visit   69 y.o. male presents with the above complaint. History confirmed with patient.   Objective:  Physical Exam: warm, good capillary refill, no trophic changes or ulcerative lesions, normal DP and PT pulses and normal sensory exam.  Painful ingrowing nail at lateral border of the left, hallux; without warmth, erythema or drainage  Assessment:   1. Ingrown nail   2. Pain around toenail    Plan:  Patient was evaluated and treated and all questions answered.  Ingrown Nail, left -Patient elects to proceed with ingrown toenail removal today -Ingrown nail excised. See procedure note. -Educated on post-procedure care including soaking. Written instructions provided. -Rx for Cortisporin drops  Procedure: Excision of Ingrown Toenail Location: Left 1st toe lateral nail borders. Anesthesia: Lidocaine 1% plain; 1.79mL and Marcaine 0.5% plain; 1.33mL, digital block. Skin Prep: Betadine. Dressing: Silvadene; telfa; dry, sterile, compression dressing. Technique: Following skin prep, the toe was exsanguinated and a tourniquet was secured at the base of the toe. The affected nail border was freed, split with a nail splitter, and excised. Chemical matrixectomy was then performed with phenol and irrigated out with alcohol. The tourniquet was then removed and sterile dressing applied. Disposition: Patient tolerated procedure well.   Plantar fasciitis -Discussed sandals with inserts inside them. Will look into this in discussion with the orthotist.  No follow-ups on file.

## 2020-01-15 NOTE — Patient Instructions (Signed)

## 2020-01-17 MED FILL — TRULICITY 3 MG/0.5ML SOPN: 3 | 84 days supply | Qty: 6 | Fill #1

## 2020-03-04 ENCOUNTER — Other Ambulatory Visit (HOSPITAL_COMMUNITY): Payer: Self-pay | Admitting: Internal Medicine

## 2020-03-04 MED FILL — AMLODIPINE BESYLATE 5 MG TA: 5 | 90 days supply | Qty: 90 | Fill #1

## 2020-03-04 MED FILL — CLOPIDOGREL 75 MG TABLET: 75 | 90 days supply | Qty: 90 | Fill #0

## 2020-03-04 MED FILL — CARVEDILOL 25 MG TABLET: 25 | 90 days supply | Qty: 180 | Fill #1

## 2020-03-04 MED FILL — RAMIPRIL 10 MG CAPSULE: 10 | 90 days supply | Qty: 180 | Fill #0

## 2020-03-04 MED FILL — TRIAMTERENE/HCTZ 37.5/25 CP: 37.5-25 | 90 days supply | Qty: 90 | Fill #1

## 2020-03-04 MED FILL — ATORVASTATIN 40 MG TABLET: 40 | 90 days supply | Qty: 90 | Fill #2

## 2020-03-07 ENCOUNTER — Ambulatory Visit: Payer: 59 | Admitting: Podiatry

## 2020-03-07 ENCOUNTER — Other Ambulatory Visit: Payer: Self-pay

## 2020-03-07 DIAGNOSIS — B351 Tinea unguium: Secondary | ICD-10-CM

## 2020-03-07 DIAGNOSIS — E1169 Type 2 diabetes mellitus with other specified complication: Secondary | ICD-10-CM | POA: Diagnosis not present

## 2020-03-07 DIAGNOSIS — E1151 Type 2 diabetes mellitus with diabetic peripheral angiopathy without gangrene: Secondary | ICD-10-CM | POA: Diagnosis not present

## 2020-03-20 ENCOUNTER — Other Ambulatory Visit (HOSPITAL_COMMUNITY): Payer: Self-pay | Admitting: Internal Medicine

## 2020-03-20 MED FILL — metFORMIN HCL ER 500 MG TB2: 500 | 90 days supply | Qty: 360 | Fill #0

## 2020-03-24 NOTE — Progress Notes (Signed)
  Subjective:  Patient ID: [name], MD, male    DOB: 11-08-1950,  MRN: [id number]  Chief Complaint  Patient presents with  . Routine Post Op    Nail trim 1-5 bilateral, left 1st toenail sore toenail.    69 y.o. male presents with the above complaint. History confirmed with patient.   Objective:  Physical Exam: warm, good capillary refill, nail exam onychomycosis of the toenails, no trophic changes or ulcerative lesions. DP pulses palpable and protective sensation intact. PT absent bilat. Right 2nd toe, 1st toe both borders POP Left Foot: normal exam, no swelling, tenderness, instability; ligaments intact, full range of motion of all ankle/foot joints  Right Foot: normal exam, no swelling, tenderness, instability; ligaments intact, full range of motion of all ankle/foot joints   No images are attached to the encounter.  Assessment:   1. Onychomycosis of multiple toenails with type 2 diabetes mellitus and peripheral angiopathy ([place])      Plan:  Patient was evaluated and treated and all questions answered.  Onychomycosis, Diabetes and PAD -Patient is diabetic with a qualifying condition for at risk foot care.   Procedure: Nail Debridement Rationale: Patient meets criteria for routine foot care due to PAD Type of Debridement: manual, sharp debridement. Instrumentation: Nail nipper, rotary burr. Number of Nails: 10     Return in about 3 months (around 06/07/2020) for Diabetic Foot Care.

## 2020-03-28 ENCOUNTER — Other Ambulatory Visit: Payer: Self-pay

## 2020-03-28 ENCOUNTER — Encounter (HOSPITAL_COMMUNITY): Payer: Self-pay

## 2020-03-28 ENCOUNTER — Ambulatory Visit (HOSPITAL_COMMUNITY): Payer: 59 | Attending: Interventional Cardiology

## 2020-03-28 ENCOUNTER — Encounter (HOSPITAL_COMMUNITY): Payer: Self-pay | Admitting: Cardiology

## 2020-03-28 ENCOUNTER — Encounter (HOSPITAL_COMMUNITY): Payer: Self-pay | Admitting: Interventional Cardiology

## 2020-03-28 DIAGNOSIS — I5042 Chronic combined systolic (congestive) and diastolic (congestive) heart failure: Secondary | ICD-10-CM | POA: Diagnosis not present

## 2020-03-28 DIAGNOSIS — N401 Enlarged prostate with lower urinary tract symptoms: Secondary | ICD-10-CM | POA: Diagnosis not present

## 2020-03-28 DIAGNOSIS — N3941 Urge incontinence: Secondary | ICD-10-CM | POA: Diagnosis not present

## 2020-03-28 LAB — ECHOCARDIOGRAM COMPLETE
Area-P 1/2: 4.39 cm2
S' Lateral: 3.1 cm

## 2020-03-28 NOTE — Progress Notes (Unsigned)
Patient ID: Ryan Bombard, MD, male   DOB: Aug 31, 1950, 69 y.o.   MRN: [id number]   Verified appointment "no show" status with Caryl Pina at 1:14pm.

## 2020-04-04 DIAGNOSIS — N5201 Erectile dysfunction due to arterial insufficiency: Secondary | ICD-10-CM | POA: Diagnosis not present

## 2020-04-04 DIAGNOSIS — N401 Enlarged prostate with lower urinary tract symptoms: Secondary | ICD-10-CM | POA: Diagnosis not present

## 2020-04-04 DIAGNOSIS — N3941 Urge incontinence: Secondary | ICD-10-CM | POA: Diagnosis not present

## 2020-04-04 DIAGNOSIS — R972 Elevated prostate specific antigen [PSA]: Secondary | ICD-10-CM | POA: Diagnosis not present

## 2020-05-06 MED FILL — TRULICITY 3 MG/0.5ML SOPN: 3 | 84 days supply | Qty: 6 | Fill #2

## 2020-06-04 ENCOUNTER — Other Ambulatory Visit (HOSPITAL_COMMUNITY): Payer: Self-pay | Admitting: Registered Nurse

## 2020-06-04 MED FILL — CLOPIDOGREL 75 MG TABLET: 75 | 90 days supply | Qty: 90 | Fill #1

## 2020-06-04 MED FILL — AMLODIPINE BESYLATE 5 MG TA: 5 | 90 days supply | Qty: 90 | Fill #2

## 2020-06-04 MED FILL — RAMIPRIL 10 MG CAPSULE: 10 | 90 days supply | Qty: 180 | Fill #1

## 2020-06-04 MED FILL — ATORVASTATIN 40 MG TABLET: 40 | 90 days supply | Qty: 90 | Fill #0

## 2020-06-04 MED FILL — TRIAMTERENE/HCTZ 37.5/25 CP: 37.5-25 | 90 days supply | Qty: 90 | Fill #2

## 2020-06-04 MED FILL — CARVEDILOL 25 MG TABS: 25 | 90 days supply | Qty: 180 | Fill #2

## 2020-06-13 ENCOUNTER — Ambulatory Visit: Payer: 59 | Admitting: Podiatry

## 2020-06-13 ENCOUNTER — Other Ambulatory Visit: Payer: Self-pay

## 2020-06-13 DIAGNOSIS — E1169 Type 2 diabetes mellitus with other specified complication: Secondary | ICD-10-CM | POA: Diagnosis not present

## 2020-06-13 DIAGNOSIS — E1151 Type 2 diabetes mellitus with diabetic peripheral angiopathy without gangrene: Secondary | ICD-10-CM

## 2020-06-13 DIAGNOSIS — B351 Tinea unguium: Secondary | ICD-10-CM

## 2020-06-13 NOTE — Progress Notes (Signed)
°  Subjective:  Patient ID: Ryan Bombard, MD, male    DOB: Feb 17, 1951,  MRN: [id number]  No chief complaint on file.  70 y.o. male presents with the above complaint. History confirmed with patient. Complains of ingrown nail left great toe.   Objective:  Physical Exam: warm, good capillary refill, nail exam onychomycosis of the toenails, no trophic changes or ulcerative lesions. DP pulses palpable and protective sensation intact. PT absent bilat. Left 1st toe POP medial border. Left Foot: normal exam, no swelling, tenderness, instability; ligaments intact, full range of motion of all ankle/foot joints  Right Foot: normal exam, no swelling, tenderness, instability; ligaments intact, full range of motion of all ankle/foot joints   No images are attached to the encounter.  Assessment:   1. Onychomycosis of multiple toenails with type 2 diabetes mellitus and peripheral angiopathy ([place])    Plan:  Patient was evaluated and treated and all questions answered.  Onychomycosis, Diabetes and PAD -Patient is diabetic with a qualifying condition for at risk foot care.  Procedure: Nail Debridement Type of Debridement: manual, sharp debridement. Instrumentation: Nail nipper, rotary burr. Number of Nails: 10  Return in about 9 weeks (around 08/15/2020) for Diabetic Foot Care.

## 2020-06-24 MED FILL — metFORMIN HCL ER 500 MG TB2: 500 | 90 days supply | Qty: 360 | Fill #1

## 2020-07-25 MED FILL — TRULICITY 3 MG/0.5ML SOPN: 3 | 84 days supply | Qty: 6 | Fill #3

## 2020-08-13 ENCOUNTER — Other Ambulatory Visit (HOSPITAL_BASED_OUTPATIENT_CLINIC_OR_DEPARTMENT_OTHER): Payer: Self-pay

## 2020-08-15 ENCOUNTER — Other Ambulatory Visit (HOSPITAL_BASED_OUTPATIENT_CLINIC_OR_DEPARTMENT_OTHER): Payer: Self-pay

## 2020-08-15 ENCOUNTER — Ambulatory Visit: Payer: 59 | Admitting: Podiatry

## 2020-08-22 ENCOUNTER — Other Ambulatory Visit: Payer: Self-pay

## 2020-08-22 ENCOUNTER — Ambulatory Visit (INDEPENDENT_AMBULATORY_CARE_PROVIDER_SITE_OTHER): Payer: 59 | Admitting: Podiatry

## 2020-08-22 DIAGNOSIS — E1169 Type 2 diabetes mellitus with other specified complication: Secondary | ICD-10-CM | POA: Diagnosis not present

## 2020-08-22 DIAGNOSIS — B351 Tinea unguium: Secondary | ICD-10-CM | POA: Diagnosis not present

## 2020-08-22 DIAGNOSIS — E1151 Type 2 diabetes mellitus with diabetic peripheral angiopathy without gangrene: Secondary | ICD-10-CM | POA: Diagnosis not present

## 2020-08-25 NOTE — Progress Notes (Signed)
  Subjective:  Patient ID: Ryan Bombard, MD, male    DOB: 1950/08/19,  MRN: [id number]  No chief complaint on file.  70 y.o. male presents with the above complaint. History confirmed with patient. Denies new issues with his feet.  Objective:  Physical Exam: warm, good capillary refill, nail exam onychomycosis of the toenails, no trophic changes or ulcerative lesions. DP pulses palpable and protective sensation intact. PT absent bilat. Left 1st toe POP medial border. Left Foot: normal exam, no swelling, tenderness, instability; ligaments intact, full range of motion of all ankle/foot joints  Right Foot: normal exam, no swelling, tenderness, instability; ligaments intact, full range of motion of all ankle/foot joints   No images are attached to the encounter.  Assessment:   1. Onychomycosis of multiple toenails with type 2 diabetes mellitus and peripheral angiopathy ([place])    Plan:  Patient was evaluated and treated and all questions answered.  Onychomycosis, Diabetes and PAD -Patient is diabetic with a qualifying condition for at risk foot care.  Procedure: Nail Debridement Type of Debridement: manual, sharp debridement. Instrumentation: Nail nipper, rotary burr. Number of Nails: 10     Return in about 9 weeks (around 10/24/2020) for Diabetic Foot Care.

## 2020-09-03 ENCOUNTER — Other Ambulatory Visit (HOSPITAL_COMMUNITY): Payer: Self-pay

## 2020-09-03 MED FILL — Metformin HCl Tab ER 24HR 500 MG: ORAL | 90 days supply | Qty: 360 | Fill #0 | Status: CN

## 2020-09-03 MED FILL — Triamterene & Hydrochlorothiazide Cap 37.5-25 MG: ORAL | 90 days supply | Qty: 90 | Fill #0 | Status: CN

## 2020-09-03 MED FILL — Amlodipine Besylate Tab 5 MG (Base Equivalent): ORAL | 90 days supply | Qty: 90 | Fill #0 | Status: CN

## 2020-09-03 MED FILL — Ramipril Cap 10 MG: ORAL | 90 days supply | Qty: 180 | Fill #0 | Status: CN

## 2020-09-03 MED FILL — Atorvastatin Calcium Tab 40 MG (Base Equivalent): ORAL | 90 days supply | Qty: 90 | Fill #0 | Status: AC

## 2020-09-03 MED FILL — Carvedilol Tab 25 MG: ORAL | 90 days supply | Qty: 180 | Fill #0 | Status: CN

## 2020-09-03 MED FILL — Clopidogrel Bisulfate Tab 75 MG (Base Equiv): ORAL | 90 days supply | Qty: 90 | Fill #0 | Status: CN

## 2020-09-03 MED FILL — Dulaglutide Soln Auto-injector 3 MG/0.5ML: SUBCUTANEOUS | 56 days supply | Qty: 4 | Fill #0 | Status: CN

## 2020-09-08 ENCOUNTER — Other Ambulatory Visit (HOSPITAL_COMMUNITY): Payer: Self-pay

## 2020-09-08 MED FILL — Carvedilol Tab 25 MG: ORAL | 90 days supply | Qty: 180 | Fill #0 | Status: AC

## 2020-09-08 MED FILL — Amlodipine Besylate Tab 5 MG (Base Equivalent): ORAL | 90 days supply | Qty: 90 | Fill #0 | Status: AC

## 2020-09-08 MED FILL — Clopidogrel Bisulfate Tab 75 MG (Base Equiv): ORAL | 90 days supply | Qty: 90 | Fill #0 | Status: AC

## 2020-09-08 MED FILL — Metformin HCl Tab ER 24HR 500 MG: ORAL | 90 days supply | Qty: 360 | Fill #0 | Status: AC

## 2020-09-08 MED FILL — Ramipril Cap 10 MG: ORAL | 90 days supply | Qty: 180 | Fill #0 | Status: AC

## 2020-09-08 MED FILL — Triamterene & Hydrochlorothiazide Cap 37.5-25 MG: ORAL | 90 days supply | Qty: 90 | Fill #0 | Status: AC

## 2020-09-09 ENCOUNTER — Other Ambulatory Visit (HOSPITAL_COMMUNITY): Payer: Self-pay

## 2020-09-19 DIAGNOSIS — G4733 Obstructive sleep apnea (adult) (pediatric): Secondary | ICD-10-CM | POA: Diagnosis not present

## 2020-09-25 DIAGNOSIS — M25561 Pain in right knee: Secondary | ICD-10-CM | POA: Diagnosis not present

## 2020-09-25 DIAGNOSIS — M25552 Pain in left hip: Secondary | ICD-10-CM | POA: Diagnosis not present

## 2020-10-24 ENCOUNTER — Ambulatory Visit: Payer: 59 | Admitting: Podiatry

## 2020-10-24 ENCOUNTER — Other Ambulatory Visit (HOSPITAL_COMMUNITY): Payer: Self-pay

## 2020-10-31 ENCOUNTER — Ambulatory Visit (INDEPENDENT_AMBULATORY_CARE_PROVIDER_SITE_OTHER): Payer: 59 | Admitting: Podiatry

## 2020-10-31 ENCOUNTER — Other Ambulatory Visit: Payer: Self-pay

## 2020-10-31 DIAGNOSIS — K219 Gastro-esophageal reflux disease without esophagitis: Secondary | ICD-10-CM | POA: Insufficient documentation

## 2020-10-31 DIAGNOSIS — E1169 Type 2 diabetes mellitus with other specified complication: Secondary | ICD-10-CM | POA: Diagnosis not present

## 2020-10-31 DIAGNOSIS — Z860100 Personal history of colon polyps, unspecified: Secondary | ICD-10-CM | POA: Insufficient documentation

## 2020-10-31 DIAGNOSIS — L600 Ingrowing nail: Secondary | ICD-10-CM

## 2020-10-31 DIAGNOSIS — B351 Tinea unguium: Secondary | ICD-10-CM | POA: Diagnosis not present

## 2020-10-31 DIAGNOSIS — E1151 Type 2 diabetes mellitus with diabetic peripheral angiopathy without gangrene: Secondary | ICD-10-CM | POA: Diagnosis not present

## 2020-10-31 DIAGNOSIS — Z1211 Encounter for screening for malignant neoplasm of colon: Secondary | ICD-10-CM | POA: Insufficient documentation

## 2020-10-31 NOTE — Progress Notes (Signed)
  Subjective:  Patient ID: Ryan Bombard, MD, male    DOB: [birth year],  MRN: [id number]  Chief Complaint  Patient presents with   Nail Problem    Nail trim 1-5 bilateral   70 y.o. male presents with the above complaint. History confirmed with patient. Denies new issues with his feet.  Objective:  Physical Exam: warm, good capillary refill, nail exam onychomycosis of the toenails, no trophic changes or ulcerative lesions. DP pulses palpable and protective sensation intact. PT absent bilat. Left 1st toe POP medial border. Left Foot: normal exam, no swelling, tenderness, instability; ligaments intact, full range of motion of all ankle/foot joints  Right Foot: normal exam, no swelling, tenderness, instability; ligaments intact, full range of motion of all ankle/foot joints   No images are attached to the encounter.  Assessment:   1. Onychomycosis of multiple toenails with type 2 diabetes mellitus and peripheral angiopathy ([place])   2. Ingrown nail    Plan:  Patient was evaluated and treated and all questions answered.  Onychomycosis, Diabetes and PAD -Patient is diabetic with a qualifying condition for at risk foot care.  Procedure: Nail Debridement Type of Debridement: manual, sharp debridement. Instrumentation: Nail nipper, rotary burr. Number of Nails: 10   Return in about 9 weeks (around 01/02/2021).

## 2020-11-11 ENCOUNTER — Other Ambulatory Visit (HOSPITAL_COMMUNITY): Payer: Self-pay

## 2020-11-12 ENCOUNTER — Other Ambulatory Visit (HOSPITAL_COMMUNITY): Payer: Self-pay

## 2020-11-14 ENCOUNTER — Other Ambulatory Visit (HOSPITAL_COMMUNITY): Payer: Self-pay

## 2020-11-15 ENCOUNTER — Other Ambulatory Visit (HOSPITAL_COMMUNITY): Payer: Self-pay

## 2020-11-17 ENCOUNTER — Other Ambulatory Visit (HOSPITAL_COMMUNITY): Payer: Self-pay

## 2020-11-17 MED ORDER — TRULICITY 3 MG/0.5ML ~~LOC~~ SOAJ
SUBCUTANEOUS | 1 refills | Status: DC
  Filled 2020-11-17: qty 2, 28d supply, fill #0
  Filled 2020-12-20: qty 2, 28d supply, fill #1

## 2020-11-20 ENCOUNTER — Other Ambulatory Visit (HOSPITAL_COMMUNITY): Payer: Self-pay

## 2020-11-21 ENCOUNTER — Other Ambulatory Visit (HOSPITAL_COMMUNITY): Payer: Self-pay

## 2020-11-21 DIAGNOSIS — E119 Type 2 diabetes mellitus without complications: Secondary | ICD-10-CM | POA: Diagnosis not present

## 2020-11-21 DIAGNOSIS — H40013 Open angle with borderline findings, low risk, bilateral: Secondary | ICD-10-CM | POA: Diagnosis not present

## 2020-11-21 DIAGNOSIS — H179 Unspecified corneal scar and opacity: Secondary | ICD-10-CM | POA: Diagnosis not present

## 2020-11-21 DIAGNOSIS — H2513 Age-related nuclear cataract, bilateral: Secondary | ICD-10-CM | POA: Diagnosis not present

## 2020-11-21 DIAGNOSIS — H25013 Cortical age-related cataract, bilateral: Secondary | ICD-10-CM | POA: Diagnosis not present

## 2020-12-01 ENCOUNTER — Other Ambulatory Visit (HOSPITAL_COMMUNITY): Payer: Self-pay

## 2020-12-19 DIAGNOSIS — E785 Hyperlipidemia, unspecified: Secondary | ICD-10-CM | POA: Diagnosis not present

## 2020-12-19 DIAGNOSIS — E1165 Type 2 diabetes mellitus with hyperglycemia: Secondary | ICD-10-CM | POA: Diagnosis not present

## 2020-12-19 DIAGNOSIS — I10 Essential (primary) hypertension: Secondary | ICD-10-CM | POA: Diagnosis not present

## 2020-12-19 DIAGNOSIS — Z125 Encounter for screening for malignant neoplasm of prostate: Secondary | ICD-10-CM | POA: Diagnosis not present

## 2020-12-20 ENCOUNTER — Other Ambulatory Visit: Payer: Self-pay | Admitting: Interventional Cardiology

## 2020-12-20 ENCOUNTER — Other Ambulatory Visit (HOSPITAL_COMMUNITY): Payer: Self-pay

## 2020-12-20 MED FILL — Atorvastatin Calcium Tab 40 MG (Base Equivalent): ORAL | 90 days supply | Qty: 90 | Fill #1 | Status: AC

## 2020-12-22 ENCOUNTER — Other Ambulatory Visit (HOSPITAL_COMMUNITY): Payer: Self-pay

## 2020-12-23 ENCOUNTER — Other Ambulatory Visit (HOSPITAL_COMMUNITY): Payer: Self-pay

## 2020-12-23 DIAGNOSIS — Z1389 Encounter for screening for other disorder: Secondary | ICD-10-CM | POA: Diagnosis not present

## 2020-12-23 DIAGNOSIS — Z0000 Encounter for general adult medical examination without abnormal findings: Secondary | ICD-10-CM | POA: Diagnosis not present

## 2020-12-23 DIAGNOSIS — I5022 Chronic systolic (congestive) heart failure: Secondary | ICD-10-CM | POA: Diagnosis not present

## 2020-12-23 DIAGNOSIS — I110 Hypertensive heart disease with heart failure: Secondary | ICD-10-CM | POA: Diagnosis not present

## 2020-12-23 DIAGNOSIS — E1165 Type 2 diabetes mellitus with hyperglycemia: Secondary | ICD-10-CM | POA: Diagnosis not present

## 2020-12-23 DIAGNOSIS — Z1331 Encounter for screening for depression: Secondary | ICD-10-CM | POA: Diagnosis not present

## 2020-12-23 DIAGNOSIS — I252 Old myocardial infarction: Secondary | ICD-10-CM | POA: Diagnosis not present

## 2020-12-23 DIAGNOSIS — E785 Hyperlipidemia, unspecified: Secondary | ICD-10-CM | POA: Diagnosis not present

## 2020-12-23 DIAGNOSIS — Z9861 Coronary angioplasty status: Secondary | ICD-10-CM | POA: Diagnosis not present

## 2020-12-23 DIAGNOSIS — I2510 Atherosclerotic heart disease of native coronary artery without angina pectoris: Secondary | ICD-10-CM | POA: Diagnosis not present

## 2020-12-23 DIAGNOSIS — R82998 Other abnormal findings in urine: Secondary | ICD-10-CM | POA: Diagnosis not present

## 2020-12-23 MED ORDER — FARXIGA 10 MG PO TABS
ORAL_TABLET | ORAL | 11 refills | Status: DC
  Filled 2020-12-23: qty 30, 30d supply, fill #0

## 2020-12-24 ENCOUNTER — Other Ambulatory Visit (HOSPITAL_COMMUNITY): Payer: Self-pay

## 2020-12-25 ENCOUNTER — Other Ambulatory Visit (HOSPITAL_COMMUNITY): Payer: Self-pay

## 2020-12-25 ENCOUNTER — Other Ambulatory Visit: Payer: Self-pay | Admitting: *Deleted

## 2020-12-25 ENCOUNTER — Other Ambulatory Visit: Payer: Self-pay | Admitting: Interventional Cardiology

## 2020-12-25 MED ORDER — CLOPIDOGREL BISULFATE 75 MG PO TABS
75.0000 mg | ORAL_TABLET | Freq: Every morning | ORAL | 3 refills | Status: DC
  Filled 2020-12-25: qty 90, 90d supply, fill #0

## 2020-12-25 MED ORDER — CLOPIDOGREL BISULFATE 75 MG PO TABS
ORAL_TABLET | ORAL | 2 refills | Status: DC
  Filled 2020-12-25 – 2021-03-30 (×2): qty 90, 90d supply, fill #0
  Filled 2021-07-09: qty 90, 90d supply, fill #1
  Filled 2021-10-16: qty 90, 90d supply, fill #2

## 2020-12-25 MED ORDER — RAMIPRIL 10 MG PO CAPS
10.0000 mg | ORAL_CAPSULE | Freq: Two times a day (BID) | ORAL | 2 refills | Status: DC
  Filled 2020-12-25: qty 180, 90d supply, fill #0

## 2020-12-25 MED ORDER — METFORMIN HCL ER 500 MG PO TB24
ORAL_TABLET | Freq: Two times a day (BID) | ORAL | 3 refills | Status: DC
  Filled 2020-12-25: qty 360, 90d supply, fill #0

## 2020-12-25 MED ORDER — FARXIGA 10 MG PO TABS
ORAL_TABLET | ORAL | 2 refills | Status: DC
  Filled 2020-12-25: qty 90, 90d supply, fill #0
  Filled 2021-02-09: qty 30, 30d supply, fill #0
  Filled 2021-03-30: qty 30, 30d supply, fill #1
  Filled 2021-05-02: qty 90, 90d supply, fill #2
  Filled 2021-08-22: qty 90, 90d supply, fill #3

## 2020-12-25 MED ORDER — METFORMIN HCL ER 500 MG PO TB24
ORAL_TABLET | ORAL | 2 refills | Status: DC
  Filled 2020-12-25 – 2021-03-30 (×2): qty 360, 90d supply, fill #0
  Filled 2021-07-09: qty 360, 90d supply, fill #1
  Filled 2021-10-16: qty 360, 90d supply, fill #2

## 2020-12-25 MED ORDER — TRIAMTERENE-HCTZ 37.5-25 MG PO CAPS
ORAL_CAPSULE | ORAL | 2 refills | Status: DC
  Filled 2020-12-25: qty 90, 90d supply, fill #0

## 2020-12-25 MED ORDER — TRIAMTERENE-HCTZ 37.5-25 MG PO CAPS
1.0000 | ORAL_CAPSULE | Freq: Every morning | ORAL | 0 refills | Status: DC
  Filled 2020-12-25: qty 90, 90d supply, fill #0

## 2020-12-25 MED ORDER — AMLODIPINE BESYLATE 5 MG PO TABS
ORAL_TABLET | Freq: Every day | ORAL | 0 refills | Status: DC
  Filled 2020-12-25: qty 90, 90d supply, fill #0

## 2020-12-25 MED ORDER — CARVEDILOL 25 MG PO TABS
ORAL_TABLET | Freq: Two times a day (BID) | ORAL | 0 refills | Status: DC
  Filled 2020-12-25: qty 180, 90d supply, fill #0

## 2020-12-25 MED ORDER — TRULICITY 3 MG/0.5ML ~~LOC~~ SOAJ
SUBCUTANEOUS | 2 refills | Status: DC
  Filled 2020-12-25 – 2021-01-27 (×3): qty 6, 84d supply, fill #0
  Filled 2021-05-02: qty 6, 84d supply, fill #1
  Filled 2021-08-22: qty 6, 84d supply, fill #2

## 2020-12-25 MED ORDER — AMLODIPINE BESYLATE 5 MG PO TABS
ORAL_TABLET | ORAL | 2 refills | Status: DC
  Filled 2020-12-25: qty 90, 90d supply, fill #0

## 2020-12-25 MED ORDER — ATORVASTATIN CALCIUM 40 MG PO TABS
ORAL_TABLET | ORAL | 0 refills | Status: DC
  Filled 2020-12-25 – 2021-03-30 (×2): qty 90, 90d supply, fill #0

## 2021-01-02 ENCOUNTER — Ambulatory Visit: Payer: 59 | Admitting: Podiatry

## 2021-01-09 ENCOUNTER — Ambulatory Visit: Payer: 59 | Admitting: Podiatry

## 2021-01-09 ENCOUNTER — Other Ambulatory Visit: Payer: Self-pay

## 2021-01-09 DIAGNOSIS — B351 Tinea unguium: Secondary | ICD-10-CM | POA: Diagnosis not present

## 2021-01-09 DIAGNOSIS — E1169 Type 2 diabetes mellitus with other specified complication: Secondary | ICD-10-CM

## 2021-01-09 DIAGNOSIS — E1151 Type 2 diabetes mellitus with diabetic peripheral angiopathy without gangrene: Secondary | ICD-10-CM

## 2021-01-09 NOTE — Progress Notes (Signed)
  Subjective:  Patient ID: Ryan Bombard, MD, male    DOB: [birth year],  MRN: [id number]  No chief complaint on file.  70 y.o. male presents with the above complaint. History confirmed with patient. Has a little pain in the right 3rd toenail but otherwise no complaints.  Objective:  Physical Exam: warm, good capillary refill, nail exam onychomycosis of the toenails, no trophic changes or ulcerative lesions. DP pulses palpable and protective sensation intact. PT absent bilat. Left Foot: normal exam, no swelling, tenderness, instability; ligaments intact, full range of motion of all ankle/foot joints  Right Foot: normal exam, no swelling, tenderness, instability; ligaments intact, full range of motion of all ankle/foot joints   No images are attached to the encounter.  Assessment:   1. Onychomycosis of multiple toenails with type 2 diabetes mellitus and peripheral angiopathy ([place])    Plan:  Patient was evaluated and treated and all questions answered.  Onychomycosis, Diabetes and PAD -Patient is diabetic with a qualifying condition for at risk foot care.  Procedure: Nail Debridement Type of Debridement: manual, sharp debridement. Instrumentation: Nail nipper, rotary burr. Number of Nails: 10   Return in about 3 months (around 04/11/2021) for Diabetic Foot Care.

## 2021-01-23 DIAGNOSIS — M545 Low back pain, unspecified: Secondary | ICD-10-CM | POA: Diagnosis not present

## 2021-01-27 ENCOUNTER — Other Ambulatory Visit (HOSPITAL_COMMUNITY): Payer: Self-pay

## 2021-02-05 ENCOUNTER — Ambulatory Visit (HOSPITAL_BASED_OUTPATIENT_CLINIC_OR_DEPARTMENT_OTHER): Payer: 59 | Attending: Orthopedic Surgery | Admitting: Physical Therapy

## 2021-02-05 ENCOUNTER — Encounter (HOSPITAL_BASED_OUTPATIENT_CLINIC_OR_DEPARTMENT_OTHER): Payer: Self-pay | Admitting: Physical Therapy

## 2021-02-05 ENCOUNTER — Other Ambulatory Visit: Payer: Self-pay

## 2021-02-05 DIAGNOSIS — M545 Low back pain, unspecified: Secondary | ICD-10-CM | POA: Diagnosis not present

## 2021-02-05 DIAGNOSIS — R262 Difficulty in walking, not elsewhere classified: Secondary | ICD-10-CM | POA: Diagnosis not present

## 2021-02-05 DIAGNOSIS — M6281 Muscle weakness (generalized): Secondary | ICD-10-CM | POA: Diagnosis not present

## 2021-02-05 NOTE — Therapy (Addendum)
OUTPATIENT PHYSICAL THERAPY THORACOLUMBAR EVALUATION & D/C   Patient Name: Ryan HEAL, MD MRN: [id number] DOB:[birth year], 70 y.o., male Today's Date: 02/05/2021  PCP: Ginger Organ., MD REFERRING PROVIDER: Melina Schools, MD   PT End of Session - 02/05/21     Visit Number 1    Number of Visits 17    Date for PT Re-Evaluation 05/06/21    Authorization Type [employer]    PT Start Time 1645   PT Stop Time 1730   PT Time Calculation (min) 45 min    Activity Tolerance Patient tolerated treatment well    Behavior During Therapy [facility] for tasks assessed/performed        Past Medical History:  Diagnosis Date   Chronic combined systolic and diastolic CHF (congestive heart failure) ([place])    Coronary atherosclerosis of native coronary artery    Dental crowns present    Essential hypertension, benign    states under control with meds., has been on med. x 15 yr.   MI (myocardial infarction) ([place]) 10/27/2002   Non-insulin dependent type 2 diabetes mellitus ([place])    Obesity    Osteoarthritis    hips and knees   Pure hypercholesterolemia    Past Surgical History:  Procedure Laterality Date   CARDIAC CATHETERIZATION  10/27/2002   CARPAL TUNNEL RELEASE Right 11/21/2013   CORONARY STENT PLACEMENT  10/27/2002   mid LAD   TOTAL HIP ARTHROPLASTY Left 04/09/1999   TOTAL HIP ARTHROPLASTY Right    TOTAL KNEE ARTHROPLASTY Left 02/03/2011   ULNAR TUNNEL RELEASE Left 06/18/2016   Procedure: LEFT CUBITAL TUNNEL RELEASE;  Surgeon: [name], MD;  Location: [place];  Service: Orthopedics;  Laterality: Left;   Patient Active Problem List   Diagnosis Date Noted   Colon cancer screening 10/31/2020   Gastroesophageal reflux disease 10/31/2020   Morbid obesity ([place]) 10/31/2020   Personal history of colonic polyps 10/31/2020   Pain of left hip joint 12/13/2019   AVNRT (AV nodal re-entry tachycardia) ([place])    Withdrawal arrhythmia ([facility])    Cubital  tunnel syndrome, left 06/29/2016   CAD (coronary artery disease), native coronary artery 05/08/2014   Hyperlipidemia 03/21/2013    Class: Chronic   Old MI (myocardial infarction) 03/21/2013    Class: Chronic   DM (diabetes mellitus), type 2, uncontrolled ([place]) 03/21/2013    Class: Chronic   Essential hypertension, benign 03/21/2013    Class: Chronic   Obesity (BMI 30-39.9) 03/21/2013   Chronic combined systolic and diastolic CHF (congestive heart failure) ([place])     ONSET DATE: 2020  REFERRING DIAG: M54.50 (ICD-10-CM) - Low back pain, unspecified   THERAPY DIAG:  Pain, lumbar region  Muscle weakness (generalized)  Difficulty walking  SUBJECTIVE:   SUBJECTIVE STATEMENT: Pt states that the LBP has been a chronic issue. He states it is a weakness and pain with walking. It does not bother him at any other time.  Sitting and resting there is no pain. Pain only "occurs with mobilization."  Pt states that back pain will sometimes occur within 100 yards of walking. Describes it as a dull lower lumbar pain and weakness. Pt denies NT and radiation. Focused into lower lumbar no pain into glutes. Pt denies trouble sleeping due to pain. Pt denies BB changes. Pt denies drop attacks. Pt denies cancer red flags. Eases: resting.  PERTINENT HISTORY: Bilateral hip replacements, and L TKA, CHF DM2, R knee OA  PAIN:  Are you having pain? No   PRECAUTIONS: None  WEIGHT BEARING RESTRICTIONS No  FALLS: Has patient fallen in last 6 months? No LIVING ENVIRONMENT: Lives with: lives with their family and lives with their spouse Lives in: House/apartment Stairs: Yes; bedroom on first floor Has following equipment at home: Single point cane, daily use   PLOF: Independent  PATIENT GOALS : Pt states he  would be able to reduce pain and walk further. Pt enjoys gardening.    OBJECTIVE:   DIAGNOSTIC FINDINGS: Pt reports DDD shown.   PATIENT SURVEYS:  Modified Oswestry Oswestry Score: 6 / 50 or 12 %   SCREENING FOR RED FLAGS: Bowel or bladder incontinence: No Spinal tumors: No Cauda equina syndrome: No    COGNITION:  Overall cognitive status: Within functional limits for tasks assessed       MUSCLE LENGTH: Limited with HS on R, knee flexion ROM deficit, quad and knee OA Bilateral hip stiffness R>L, especially with IR and extension     LUMBARAROM/PROM  A/PROM A/PROM  02/05/2021  Flexion 80%  Extension 20%  Right lateral flexion 60%  Left lateral flexion 60%  Right rotation 70%  Left rotation 60%   (Blank rows = not tested)  LE AROM/PROM:  A/PROM Right 02/05/2021 Left 02/05/2021  Hip flexion 110 115  Hip Extension  -5 -10                                       (Blank rows = not tested)  LE MMT:  MMT Right 02/05/2021 Left 02/05/2021  Hip flexion 4/5 4/5  Hip abduction 4/5 4/5  Hip adduction 4/5 4/5                                   (Blank rows = not tested)  LUMBAR SPECIAL TESTS:  Straight leg raise test: Negative, Slump test: Negative, and SI Compression/distraction test: Negative   FUNCTIONAL TESTS:  5 times sit to stand: 14.9s   GAIT: Distance walked: 35 Assistive device utilized: None Level of assistance: Complete Independence Comments: forward trunk flexion, decreased pelvic rotation, lack of hip extension bilat R>L, antalgic gait, reduced foot clearance on R    Today's Treatment:  Exercises Supine Bridge - 2 x daily - 7 x weekly - 2 sets - 10 reps Side Stepping with Resistance at Ankles - 2 x daily - 7 x weekly - 1 sets - 3 reps - 62f hold Sit to Stand with Arms Crossed - 2 x daily - 7 x weekly - 1 sets - 10 reps    PATIENT EDUCATION:  Education details: MOI, diagnosis, prognosis, anatomy, exercise progression, DOMS  expectations, muscle firing,  envelope of function, HEP, POC  Person educated: Patient Education method: Explanation, Demonstration, Tactile cues, Verbal cues, and Handouts Education comprehension: verbalized understanding and returned demonstration   HOME EXERCISE PROGRAM:  Access Code: [email or web address] ASSESSMENT:  CLINICAL IMPRESSION: Patient is a 70y.o. male who was seen today for physical therapy evaluation and treatment for CC of LBP. Objective impairments include Abnormal gait, decreased activity tolerance, decreased balance, decreased endurance, decreased mobility, difficulty walking, decreased ROM, decreased strength, impaired flexibility, improper body mechanics, postural dysfunction, obesity, and pain. Pt's s/s appear consistent with nonspecific mechanical LBP  due to postural and lumbopelvic weakness. Pt's pain is minimally irritable and only occurs with extended upright activity. Pt's largest deficit is lumbopelvic strength and endurance. These impairments are limiting patient from community activity, occupation, yard work, and shopping. Personal factors including Age, Fitness, Time since onset of injury/illness/exacerbation, and 1-3 comorbidities: CHF, OA, DM2 are also affecting patient's functional outcome. Patient will benefit from skilled PT to address above impairments and improve overall function.  REHAB POTENTIAL: Good  CLINICAL DECISION MAKING: Stable/uncomplicated  EVALUATION COMPLEXITY: Low   GOALS:  SHORT TERM GOALS:  STG Name Target Date Goal status  1 Pt will become independent with HEP in order to demonstrate synthesis of PT education.   02/19/2021 INITIAL  2 Pt will be able to perform 5XSTS in under 12s  in order to demonstrate functional improvement above the cut off score for adults. Baseline: 14.9s 03/05/2021 INITIAL  3 Pt will be able to demonstrate ability to stand/walk for 5 mins in order to demonstrate functional improvement in lumbar/LE function and  function for self-care and community mobility.   Baseline: <78mn 03/05/2021 INITIAL   LONG TERM GOALS:   LTG Name Target Date Goal status  1 Pt  will become independent with final HEP in order to demonstrate synthesis of PT education.   04/02/2021 INITIAL  2 Pt will be able to demonstrate/report ability to walk >/= 20 mins without pain in order to demonstrate functional improvement and tolerance to exercise and community mobility.  04/02/2021 INITIAL  3 Pt will be able to demonstrate kneeling to stand and stand to kneeling transfer with UE support in order to demonstrate functional improvement in LE function for full safe performance of gardening activities.   04/02/2021 INITIAL  4 Pt will be able to report a decrease of hip and back pain during walking by >/= 2/10 in order to demonstrate functional improvement in L hip and back pain.  04/02/2021 INITIAL   PLAN: PT FREQUENCY: 1-2x/week  PT DURATION: 8 weeks  PLANNED INTERVENTIONS: Therapeutic exercises, Therapeutic activity, Neuro Muscular re-education, Balance training, Gait training, Patient/Family education, Joint mobilization, Stair training, Aquatic Therapy, Dry Needling, Electrical stimulation, Spinal mobilization, Cryotherapy, Moist heat, Taping, Vasopneumatic device, Traction, Ultrasound, Ionotophoresis 444mml Dexamethasone, and Manual therapy  PLAN FOR NEXT SESSION: aquatic therapy pool strengthening   AlDaleen BoT, DPT 02/05/21 6:06 PM  PHYSICAL THERAPY DISCHARGE SUMMARY  Visits from Start of Care: 1  Plan: Patient goals were not met. Patient is being discharged due to not returning to therapy prior to surgery.

## 2021-02-09 ENCOUNTER — Other Ambulatory Visit (HOSPITAL_COMMUNITY): Payer: Self-pay

## 2021-02-14 NOTE — Progress Notes (Signed)
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   The heart is doing well however he admits to a sedentary lifestyle more than he would like.  This is primarily related to hip and knee issues.  He will have an upcoming right knee operation by Dr. Wynelle Link later this year.  He denies indigestion which was the complaint when he had his myocardial infarction in 2004.  Not having orthopnea, PND, lower extremity swelling, palpitations, and has not had syncope.  Most recent CV evaluation included an echocardiogram performed in 2021 with a reported ejection fraction of 45%.  Past Medical History:  Diagnosis Date   Chronic combined systolic and diastolic CHF (congestive heart failure) (HCC)    Coronary atherosclerosis of native coronary artery    Dental crowns present    Essential hypertension, benign    states under control with meds., has been on med. x 15 yr.   MI (myocardial infarction) ([place]) 10/27/2002   Non-insulin dependent type 2 diabetes mellitus ([place])    Obesity    Osteoarthritis    hips and knees   Pure hypercholesterolemia     Past Surgical History:  Procedure Laterality Date   CARDIAC CATHETERIZATION  10/27/2002   CARPAL TUNNEL RELEASE Right 11/21/2013   CORONARY STENT PLACEMENT  10/27/2002   mid LAD   TOTAL HIP ARTHROPLASTY Left 04/09/1999   TOTAL HIP ARTHROPLASTY Right    TOTAL KNEE ARTHROPLASTY Left 02/03/2011   ULNAR TUNNEL RELEASE  Left 06/18/2016   Procedure: LEFT CUBITAL TUNNEL RELEASE;  Surgeon: [name], MD;  Location: [facility];  Service: Orthopedics;  Laterality: Left;    Current Medications: Current Meds  Medication Sig   amLODipine (NORVASC) 5 MG tablet TAKE 1 TABLET BY MOUTH ONCE DAILY   atorvastatin (LIPITOR) 40 MG tablet TAKE 1 TABLET BY MOUTH DAILY AT BEDTIME   calcium carbonate (TUMS EX) 750 MG chewable tablet Chew 2 tablets by mouth as needed for heartburn.   carvedilol (COREG) 25 MG tablet TAKE 1 TABLET BY MOUTH TWO TIMES DAILY WITH MEALS   ciclopirox (LOPROX) 0.77 % cream Apply topically 2 (two) times daily.   clopidogrel (PLAVIX) 75 MG tablet Take 1 tablet by mouth once daily   dapagliflozin propanediol (FARXIGA) 10 MG TABS tablet Take 1 tablet by mouth once daily   Dulaglutide (TRULICITY) 3 CH/8.8FO SOPN Inject 3 mg Subcutaneously once a week   Menthol, Topical Analgesic, (BIOFREEZE ROLL-ON EX) Apply 1 application topically as needed (arthritis).   metFORMIN (GLUCOPHAGE-XR) 500 MG 24 hr tablet Take 2 tablets by mouth twice daily   neomycin-polymyxin-hydrocortisone (CORTISPORIN) OTIC solution Apply 1 to 2 drops to toe BID after soaking   neomycin-polymyxin-hydrocortisone (CORTISPORIN) OTIC solution Apply 1 to 2 drops to toe BID after soaking   ramipril (ALTACE) 10 MG capsule Take 10 mg by mouth 2 (two) times daily.    triamterene-hydrochlorothiazide (DYAZIDE) 37.5-25 MG capsule  TAKE 1 CAPSULE BY MOUTH ONCE A DAY IN THE MORNING   TRULICITY 3 EH/6.3JS SOPN Inject 3 mg into the skin once a week.      Allergies:   Mebendazole and No known allergies   Social History   Socioeconomic History   Marital status: Married    Spouse name: Not on file   Number of children: Not on file   Years of education: Not on file   Highest education level: Not on file  Occupational History   Not on file  Tobacco Use   Smoking status: Never   Smokeless tobacco: Never  Substance and Sexual  Activity   Alcohol use: Yes    Alcohol/week: 0.0 standard drinks    Comment: occasionally   Drug use: No   Sexual activity: Not on file  Other Topics Concern   Not on file  Social History Narrative   Not on file   Social Determinants of Health   Financial Resource Strain: Not on file  Food Insecurity: Not on file  Transportation Needs: Not on file  Physical Activity: Not on file  Stress: Not on file  Social Connections: Not on file     Family History: The patient's family history includes Healthy in his father and mother.  ROS:   Please see the history of present illness.    He is not exercising.  He has severe right knee pain.  He has had both hips and the left knee surgically repaired.  He does admit to compliance with CPAP on a nightly basis.  He and Dr. Brigitte Pulse started all other systems reviewed and are negative.  EKGs/Labs/Other Studies Reviewed:    The following studies were reviewed today:  ECHOCARDIOGRAM 2021: IMPRESSIONS     1. Left ventricular ejection fraction, by estimation, is 45 to 50%. The  left ventricle has mildly reduced function. The left ventricle has no  regional wall motion abnormalities. There is mild concentric hypertrophy  with focal moderate basal-septal  hypertrophy.Left ventricular diastolic parameters are consistent with  Grade I diastolic dysfunction (impaired relaxation).   2. Right ventricular systolic function is normal. The right ventricular  size is normal.   3. The mitral valve is normal in structure. Trivial mitral valve  regurgitation.   4. The aortic valve is tricuspid. There is mild calcification of the  aortic valve. There is mild thickening of the aortic valve. Aortic valve  regurgitation is not visualized.   5. Aortic dilatation noted. There is mild dilatation of the aortic root,  measuring 41 mm. There is mild to moderate dilatation of the ascending  aorta, measuring 41 mm.   6. The inferior vena cava is normal in size with  <50% respiratory  variability, suggesting right atrial pressure of 8 mmHg.   Comparison(s): Compared to prior TTE in 2017, the LVEF now appears to be  about 45-50%. Previously 40-45% .  EKG:  EKG normal sinus rhythm at 96 bpm, left anterior hemiblock, extensive precordial Q waves. When compared to prior, the heart rate is slower.  Recent Labs: No results found for requested labs within last 8760 hours.  Recent Lipid Panel    Component Value Date/Time   CHOL 190 07/29/2016 0000   TRIG 93 07/29/2016 0000   HDL 55 07/29/2016 0000   LDLCALC 116 (H) 07/29/2016 0000    Physical Exam:    VS:  BP 126/82   Pulse 96   Ht 5\' 5"  (1.651 m)   Wt 210 lb 6.4 oz (95.4  kg)   SpO2 99%   BMI 35.01 kg/m     Wt Readings from Last 3 Encounters:  02/16/21 210 lb 6.4 oz (95.4 kg)  11/16/19 224 lb (101.6 kg)  11/03/18 230 lb (104.3 kg)     GEN: Morbid. No acute distress HEENT: Normal NECK: No JVD. LYMPHATICS: No lymphadenopathy CARDIAC: No murmur. RRR no gallop, or edema. VASCULAR:  Normal Pulses. No bruits. RESPIRATORY:  Clear to auscultation without rales, wheezing or rhonchi  ABDOMEN: Soft, non-tender, non-distended, No pulsatile mass, MUSCULOSKELETAL: No deformity  SKIN: Warm and dry NEUROLOGIC:  Alert and oriented x 3 PSYCHIATRIC:  Normal affect   ASSESSMENT:    1. Chronic combined systolic and diastolic CHF (congestive heart failure) ([place])   2. Coronary artery disease involving native coronary artery of native heart with other form of angina pectoris ([place])   3. Other hyperlipidemia   4. Essential hypertension, benign   5. Uncontrolled type 2 diabetes mellitus with hyperglycemia (HCC)    PLAN:    In order of problems listed above:  He is on quadruple therapy including carvedilol, triamterene, Altace, and Farxiga.  No evidence of volume overload.  No ischemic symptoms.  Heart rate is high normal range as it has been for the last several years. Continue aggressive secondary  prevention.  If at all possible we should keep the LDL closer to 55. Continue Lipitor 40 mg/day but may need to consider 80 mg or adding Zetia. Blood pressure control is excellent.  Low-salt diet. Most recent hemoglobin A1c 7.3.  This is significantly down compared to a year ago when it was 10.2.   Overall education and awareness concerning secondary risk prevention was discussed in detail: LDL less than 70, hemoglobin A1c less than 7, blood pressure target less than 130/80 mmHg, >150 minutes of moderate aerobic activity per week, avoidance of smoking, weight control (via diet and exercise), and continued surveillance/management of/for obstructive sleep apnea.    Medication Adjustments/Labs and Tests Ordered: Current medicines are reviewed at length with the patient today.  Concerns regarding medicines are outlined above.  Orders Placed This Encounter  Procedures   EKG 12-Lead   No orders of the defined types were placed in this encounter.   Patient Instructions  Medication Instructions:  Your physician recommends that you continue on your current medications as directed. Please refer to the Current Medication list given to you today.  *If you need a refill on your cardiac medications before your next appointment, please call your pharmacy*   Lab Work: None If you have labs (blood work) drawn today and your tests are completely normal, you will receive your results only by: [place] (if you have MyChart) OR A paper copy in the mail If you have any lab test that is abnormal or we need to change your treatment, we will call you to review the results.   Testing/Procedures: None   Follow-Up: At [facility], you and your health needs are our priority.  As part of our continuing mission to provide you with exceptional heart care, we have created designated Provider Care Teams.  These Care Teams include your primary Cardiologist (physician) and Advanced Practice Providers  (APPs -  Physician Assistants and Nurse Practitioners) who all work together to provide you with the care you need, when you need it.  We recommend signing up for the patient portal called "MyChart".  Sign up information is provided on this After Visit Summary.  MyChart is used to connect with patients for  Virtual Visits (Telemedicine).  Patients are able to view lab/test results, encounter notes, upcoming appointments, etc.  Non-urgent messages can be sent to your provider as well.   To learn more about what you can do with MyChart, go to [email or web address].    Your next appointment:   1 year(s)  The format for your next appointment:   In Person  Provider:   You may see Sinclair Grooms, MD or one of the following Advanced Practice Providers on your designated Care Team:   Cecilie Kicks, NP   Other Instructions     Signed, Sinclair Grooms, MD  02/16/2021 4:52 PM    [place]

## 2021-02-16 ENCOUNTER — Encounter: Payer: Self-pay | Admitting: Interventional Cardiology

## 2021-02-16 ENCOUNTER — Ambulatory Visit: Payer: 59 | Admitting: Interventional Cardiology

## 2021-02-16 ENCOUNTER — Other Ambulatory Visit: Payer: Self-pay

## 2021-02-16 VITALS — BP 126/82 | HR 96 | Ht 65.0 in | Wt 210.4 lb

## 2021-02-16 DIAGNOSIS — I10 Essential (primary) hypertension: Secondary | ICD-10-CM | POA: Diagnosis not present

## 2021-02-16 DIAGNOSIS — I25118 Atherosclerotic heart disease of native coronary artery with other forms of angina pectoris: Secondary | ICD-10-CM | POA: Diagnosis not present

## 2021-02-16 DIAGNOSIS — I5042 Chronic combined systolic (congestive) and diastolic (congestive) heart failure: Secondary | ICD-10-CM

## 2021-02-16 DIAGNOSIS — E1165 Type 2 diabetes mellitus with hyperglycemia: Secondary | ICD-10-CM

## 2021-02-16 DIAGNOSIS — E7849 Other hyperlipidemia: Secondary | ICD-10-CM | POA: Diagnosis not present

## 2021-02-16 NOTE — Patient Instructions (Signed)

## 2021-02-19 DIAGNOSIS — M1711 Unilateral primary osteoarthritis, right knee: Secondary | ICD-10-CM | POA: Diagnosis not present

## 2021-02-20 ENCOUNTER — Other Ambulatory Visit (HOSPITAL_BASED_OUTPATIENT_CLINIC_OR_DEPARTMENT_OTHER): Payer: Self-pay

## 2021-02-24 ENCOUNTER — Telehealth: Payer: Self-pay | Admitting: *Deleted

## 2021-02-24 NOTE — Telephone Encounter (Signed)
   [facility] Pre-operative Risk Assessment    Patient Name: Ryan MUNCH, Ryan Walsh  DOB: Jan 16, 1951 MRN: [id number]  HEARTCARE STAFF:  - IMPORTANT!!!!!! Under Visit Info/Reason for Call, type in Other and utilize the format Clearance MM/DD/YY or Clearance TBD. Do not use dashes or single digits. - Please review there is not already an duplicate clearance open for this procedure. - If request is for dental extraction, please clarify the # of teeth to be extracted. - If the patient is currently at the dentist's office, call Pre-Op Callback Staff (MA/nurse) to input urgent request.  - If the patient is not currently in the dentist office, please route to the Pre-Op pool.  Request for surgical clearance:  What type of surgery is being performed?  RIGHT TOTAL KNEE ARTHRPLASTY   When is this surgery scheduled?  05/11/21  What type of clearance is required (medical clearance vs. Pharmacy clearance to hold med vs. Both)?  PHARMACY  Are there any medications that need to be held prior to surgery and how long?  PLAVIX  Practice name and name of physician performing surgery?  [facility]  / DR. Maureen Ralphs  What is the office phone number?  [phone]   7.   What is the office fax number?  [phone] ATTN:  KELLY  8.   Anesthesia type (None, local, MAC, general) ?  CHOICE   Jeanann Lewandowsky 02/24/2021, 10:28 AM  _________________________________________________________________   (provider comments below)

## 2021-02-24 NOTE — Telephone Encounter (Signed)
GRACESON NICHELSON 70 year old male is requesting preoperative cardiac evaluation for right knee arthroplasty.  He was recently seen in the clinic on 02/16/2021.  During that time he continued to do well from cardiac standpoint.  His primary issues were orthopedic in nature.  His follow-up was planned for 1 year  He denied indigestion, his anginal equivalent from 2004.  He denied orthopnea, PND, lower extremity swelling, palpitations, and syncope.  His echocardiogram 2021 showed a stable EF of 45%.  His PMH includes coronary artery disease, prior anterior infarct treated with PCI and stenting, chronic combined systolic and diastolic CHF, essential hypertension, obesity, HLD, and diabetes mellitus.  May his Plavix be held prior to his surgery?  Thank you for your help.  Please direct your response to CV DIV preop pool.  Jossie Ng. Aminata Buffalo NP-C    02/24/2021, 10:48 AM [address] [address] Office [phone] Fax [phone]

## 2021-02-25 NOTE — Telephone Encounter (Signed)
    Patient Name: Ryan Bombard, MD  DOB: [birth year] MRN: [id number]  Primary Cardiologist: Sinclair Grooms, MD  Chart reviewed as part of pre-operative protocol coverage. The patient was recently seen in clinic 02/16/21 and clinically doing well. [name] was aware of potential upcoming knee surgery. The patient was not describing any ischemic symptoms at that visit. No further testing was advised. Therefore, given past medical history and time since last visit, based on ACC/AHA guidelines, Ryan Bombard, MD would be at acceptable risk for the planned procedure without further cardiovascular testing. Per [name], okay to hold Plavix for surgery. I will route this recommendation to the requesting party via Epic fax function and remove from pre-op pool.  Please call with questions.  Charlie Pitter, PA-C 02/25/2021, 10:14 AM

## 2021-03-10 ENCOUNTER — Other Ambulatory Visit (HOSPITAL_COMMUNITY): Payer: Self-pay

## 2021-03-13 ENCOUNTER — Ambulatory Visit: Payer: 59 | Attending: Internal Medicine

## 2021-03-13 ENCOUNTER — Ambulatory Visit: Payer: 59

## 2021-03-13 DIAGNOSIS — Z23 Encounter for immunization: Secondary | ICD-10-CM

## 2021-03-13 NOTE — Progress Notes (Signed)
   Covid-19 Vaccination Clinic  Name:  MARKIS LANGLAND, MD    MRN: [id number] DOB: 1951/03/30  03/13/2021  Mr. Hoh was observed post Covid-19 immunization for 15 minutes without incident. He was provided with Vaccine Information Sheet and instruction to access the V-Safe system.   Mr. Scioneaux was instructed to call 911 with any severe reactions post vaccine: Difficulty breathing  Swelling of face and throat  A fast heartbeat  A bad rash all over body  Dizziness and weakness   Immunizations Administered     Name Date Dose VIS Date Route   Pfizer Covid-19 Vaccine Bivalent Booster 03/13/2021  1:06 PM 0.3 mL 01/21/2021 Intramuscular   Manufacturer: [place]   Lot: [id number]   [place]: [phone]

## 2021-03-16 DIAGNOSIS — M79642 Pain in left hand: Secondary | ICD-10-CM | POA: Diagnosis not present

## 2021-03-16 DIAGNOSIS — M79641 Pain in right hand: Secondary | ICD-10-CM | POA: Diagnosis not present

## 2021-03-26 DIAGNOSIS — G5623 Lesion of ulnar nerve, bilateral upper limbs: Secondary | ICD-10-CM | POA: Diagnosis not present

## 2021-03-26 DIAGNOSIS — M79642 Pain in left hand: Secondary | ICD-10-CM | POA: Diagnosis not present

## 2021-03-26 DIAGNOSIS — M79641 Pain in right hand: Secondary | ICD-10-CM | POA: Diagnosis not present

## 2021-03-26 DIAGNOSIS — G5603 Carpal tunnel syndrome, bilateral upper limbs: Secondary | ICD-10-CM | POA: Diagnosis not present

## 2021-03-26 DIAGNOSIS — G5621 Lesion of ulnar nerve, right upper limb: Secondary | ICD-10-CM | POA: Diagnosis not present

## 2021-03-27 ENCOUNTER — Other Ambulatory Visit: Payer: Self-pay | Admitting: Orthopedic Surgery

## 2021-03-27 DIAGNOSIS — R972 Elevated prostate specific antigen [PSA]: Secondary | ICD-10-CM | POA: Diagnosis not present

## 2021-03-30 ENCOUNTER — Other Ambulatory Visit (HOSPITAL_COMMUNITY): Payer: Self-pay

## 2021-03-30 ENCOUNTER — Other Ambulatory Visit: Payer: Self-pay | Admitting: Interventional Cardiology

## 2021-03-30 MED ORDER — AMLODIPINE BESYLATE 5 MG PO TABS
ORAL_TABLET | Freq: Every day | ORAL | 0 refills | Status: DC
  Filled 2021-03-30: qty 90, 90d supply, fill #0

## 2021-03-30 MED ORDER — CARVEDILOL 25 MG PO TABS
ORAL_TABLET | Freq: Two times a day (BID) | ORAL | 0 refills | Status: DC
  Filled 2021-03-30: qty 180, 90d supply, fill #0

## 2021-03-30 MED ORDER — RAMIPRIL 10 MG PO CAPS
ORAL_CAPSULE | ORAL | 2 refills | Status: DC
  Filled 2021-03-30: qty 180, 90d supply, fill #0
  Filled 2021-07-09: qty 180, 90d supply, fill #1
  Filled 2021-10-16: qty 180, 90d supply, fill #2

## 2021-03-30 MED ORDER — TRIAMTERENE-HCTZ 37.5-25 MG PO CAPS
1.0000 | ORAL_CAPSULE | Freq: Every morning | ORAL | 0 refills | Status: DC
  Filled 2021-03-30: qty 90, 90d supply, fill #0

## 2021-03-31 ENCOUNTER — Other Ambulatory Visit (HOSPITAL_COMMUNITY): Payer: Self-pay

## 2021-04-03 DIAGNOSIS — R972 Elevated prostate specific antigen [PSA]: Secondary | ICD-10-CM | POA: Diagnosis not present

## 2021-04-03 DIAGNOSIS — N3941 Urge incontinence: Secondary | ICD-10-CM | POA: Diagnosis not present

## 2021-04-03 DIAGNOSIS — N401 Enlarged prostate with lower urinary tract symptoms: Secondary | ICD-10-CM | POA: Diagnosis not present

## 2021-04-03 DIAGNOSIS — N5201 Erectile dysfunction due to arterial insufficiency: Secondary | ICD-10-CM | POA: Diagnosis not present

## 2021-04-09 ENCOUNTER — Encounter (HOSPITAL_BASED_OUTPATIENT_CLINIC_OR_DEPARTMENT_OTHER): Payer: Self-pay | Admitting: Orthopedic Surgery

## 2021-04-13 ENCOUNTER — Other Ambulatory Visit (HOSPITAL_COMMUNITY): Payer: Self-pay

## 2021-04-14 ENCOUNTER — Encounter (HOSPITAL_BASED_OUTPATIENT_CLINIC_OR_DEPARTMENT_OTHER)
Admission: RE | Admit: 2021-04-14 | Discharge: 2021-04-14 | Disposition: A | Discharge status: Home / Self Care | Payer: 59 | Source: Ambulatory Visit | Attending: Orthopedic Surgery | Admitting: Orthopedic Surgery

## 2021-04-14 DIAGNOSIS — G5621 Lesion of ulnar nerve, right upper limb: Secondary | ICD-10-CM | POA: Diagnosis not present

## 2021-04-14 DIAGNOSIS — I5042 Chronic combined systolic (congestive) and diastolic (congestive) heart failure: Secondary | ICD-10-CM | POA: Diagnosis not present

## 2021-04-14 DIAGNOSIS — G5601 Carpal tunnel syndrome, right upper limb: Secondary | ICD-10-CM | POA: Diagnosis not present

## 2021-04-14 DIAGNOSIS — E119 Type 2 diabetes mellitus without complications: Secondary | ICD-10-CM | POA: Diagnosis not present

## 2021-04-14 DIAGNOSIS — Z7984 Long term (current) use of oral hypoglycemic drugs: Secondary | ICD-10-CM | POA: Diagnosis not present

## 2021-04-14 DIAGNOSIS — Z01812 Encounter for preprocedural laboratory examination: Secondary | ICD-10-CM | POA: Insufficient documentation

## 2021-04-14 DIAGNOSIS — Z6834 Body mass index (BMI) 34.0-34.9, adult: Secondary | ICD-10-CM | POA: Diagnosis not present

## 2021-04-14 DIAGNOSIS — I110 Hypertensive heart disease with heart failure: Secondary | ICD-10-CM | POA: Diagnosis not present

## 2021-04-14 DIAGNOSIS — E669 Obesity, unspecified: Secondary | ICD-10-CM | POA: Diagnosis not present

## 2021-04-14 LAB — BASIC METABOLIC PANEL
Anion gap: 11 (ref 5–15)
BUN: 11 mg/dL (ref 8–23)
CO2: 26 mmol/L (ref 22–32)
Calcium: 8.8 mg/dL — ABNORMAL LOW (ref 8.9–10.3)
Chloride: 104 mmol/L (ref 98–111)
Creatinine, Ser: 0.83 mg/dL (ref 0.61–1.24)
GFR, Estimated: >60 mL/min (ref >60)
Glucose, Bld: 151 mg/dL — ABNORMAL HIGH (ref 70–99)
Potassium: 4.4 mmol/L (ref 3.5–5.1)
Sodium: 141 mmol/L (ref 135–145)

## 2021-04-14 NOTE — Progress Notes (Signed)

## 2021-04-15 ENCOUNTER — Encounter (HOSPITAL_BASED_OUTPATIENT_CLINIC_OR_DEPARTMENT_OTHER)
Admission: RE | Disposition: A | Discharge status: Home / Self Care | Payer: Self-pay | Source: Home / Self Care | Attending: Orthopedic Surgery

## 2021-04-15 ENCOUNTER — Ambulatory Visit (HOSPITAL_BASED_OUTPATIENT_CLINIC_OR_DEPARTMENT_OTHER): Payer: 59 | Admitting: Certified Registered"

## 2021-04-15 ENCOUNTER — Other Ambulatory Visit (HOSPITAL_COMMUNITY): Payer: Self-pay

## 2021-04-15 ENCOUNTER — Other Ambulatory Visit: Payer: Self-pay

## 2021-04-15 ENCOUNTER — Encounter (HOSPITAL_BASED_OUTPATIENT_CLINIC_OR_DEPARTMENT_OTHER): Payer: Self-pay | Admitting: Orthopedic Surgery

## 2021-04-15 ENCOUNTER — Ambulatory Visit (HOSPITAL_BASED_OUTPATIENT_CLINIC_OR_DEPARTMENT_OTHER)
Admission: RE | Admit: 2021-04-15 | Discharge: 2021-04-15 | Disposition: A | Discharge status: Home / Self Care | Payer: 59 | Attending: Orthopedic Surgery | Admitting: Orthopedic Surgery

## 2021-04-15 DIAGNOSIS — G5601 Carpal tunnel syndrome, right upper limb: Secondary | ICD-10-CM | POA: Insufficient documentation

## 2021-04-15 DIAGNOSIS — E119 Type 2 diabetes mellitus without complications: Secondary | ICD-10-CM | POA: Insufficient documentation

## 2021-04-15 DIAGNOSIS — I110 Hypertensive heart disease with heart failure: Secondary | ICD-10-CM | POA: Insufficient documentation

## 2021-04-15 DIAGNOSIS — E7800 Pure hypercholesterolemia, unspecified: Secondary | ICD-10-CM | POA: Diagnosis not present

## 2021-04-15 DIAGNOSIS — G5621 Lesion of ulnar nerve, right upper limb: Secondary | ICD-10-CM | POA: Insufficient documentation

## 2021-04-15 DIAGNOSIS — I5042 Chronic combined systolic (congestive) and diastolic (congestive) heart failure: Secondary | ICD-10-CM | POA: Insufficient documentation

## 2021-04-15 DIAGNOSIS — E669 Obesity, unspecified: Secondary | ICD-10-CM | POA: Insufficient documentation

## 2021-04-15 DIAGNOSIS — Z7984 Long term (current) use of oral hypoglycemic drugs: Secondary | ICD-10-CM | POA: Diagnosis not present

## 2021-04-15 DIAGNOSIS — K219 Gastro-esophageal reflux disease without esophagitis: Secondary | ICD-10-CM | POA: Diagnosis not present

## 2021-04-15 DIAGNOSIS — Z6834 Body mass index (BMI) 34.0-34.9, adult: Secondary | ICD-10-CM | POA: Diagnosis not present

## 2021-04-15 HISTORY — DX: Sleep apnea, unspecified: G47.30

## 2021-04-15 HISTORY — PX: CARPAL TUNNEL RELEASE: SHX101

## 2021-04-15 HISTORY — PX: ANTERIOR INTEROSSEOUS NERVE DECOMPRESSION: SHX5735

## 2021-04-15 LAB — GLUCOSE, CAPILLARY
Glucose-Capillary: 122 mg/dL — ABNORMAL HIGH (ref 70–99)
Glucose-Capillary: 117 mg/dL — ABNORMAL HIGH (ref 70–99)

## 2021-04-15 SURGERY — CARPAL TUNNEL RELEASE
Anesthesia: Monitor Anesthesia Care | Laterality: Right

## 2021-04-15 MED ORDER — AMISULPRIDE (ANTIEMETIC) 5 MG/2ML IV SOLN: 10.0000 mg | Freq: Once | INTRAVENOUS | Status: DC | PRN

## 2021-04-15 MED ORDER — ROPIVACAINE HCL 5 MG/ML IJ SOLN
INTRAMUSCULAR | Status: DC | PRN
  Administered 2021-04-15: 30 mL via EPIDURAL

## 2021-04-15 MED ORDER — OXYCODONE-ACETAMINOPHEN 5-325 MG PO TABS
1.0000 | ORAL_TABLET | ORAL | 0 refills | Status: DC | PRN
  Filled 2021-04-15: qty 20, 4d supply, fill #0

## 2021-04-15 MED ORDER — FENTANYL CITRATE (PF) 100 MCG/2ML IJ SOLN: 25.0000 ug | INTRAMUSCULAR | Status: DC | PRN

## 2021-04-15 MED ORDER — ONDANSETRON HCL 4 MG/2ML IJ SOLN
INTRAMUSCULAR | Status: DC | PRN
  Administered 2021-04-15: 4 mg via INTRAVENOUS

## 2021-04-15 MED ORDER — CEFAZOLIN SODIUM-DEXTROSE 2-4 GM/100ML-% IV SOLN
2.0000 g | INTRAVENOUS | Status: AC
  Administered 2021-04-15: 2 g via INTRAVENOUS

## 2021-04-15 MED ORDER — MIDAZOLAM HCL 2 MG/2ML IJ SOLN
2.0000 mg | Freq: Once | INTRAMUSCULAR | Status: AC
  Administered 2021-04-15: 2 mg via INTRAVENOUS

## 2021-04-15 MED ORDER — 0.9 % SODIUM CHLORIDE (POUR BTL) OPTIME
TOPICAL | Status: DC | PRN
  Administered 2021-04-15: 100 mL

## 2021-04-15 MED ORDER — FENTANYL CITRATE (PF) 100 MCG/2ML IJ SOLN
100.0000 ug | Freq: Once | INTRAMUSCULAR | Status: AC
  Administered 2021-04-15: 100 ug via INTRAVENOUS

## 2021-04-15 MED ORDER — LIDOCAINE HCL (CARDIAC) PF 100 MG/5ML IV SOSY
PREFILLED_SYRINGE | INTRAVENOUS | Status: DC | PRN
  Administered 2021-04-15: 30 mg via INTRAVENOUS

## 2021-04-15 MED ORDER — PROPOFOL 500 MG/50ML IV EMUL
INTRAVENOUS | Status: DC | PRN
  Administered 2021-04-15: 75 ug/kg/min via INTRAVENOUS

## 2021-04-15 MED ORDER — CEFAZOLIN SODIUM-DEXTROSE 2-4 GM/100ML-% IV SOLN
INTRAVENOUS | Status: AC
  Filled 2021-04-15: qty 100

## 2021-04-15 MED ORDER — PROMETHAZINE HCL 25 MG/ML IJ SOLN: 6.2500 mg | INTRAMUSCULAR | Status: DC | PRN

## 2021-04-15 MED ORDER — FENTANYL CITRATE (PF) 100 MCG/2ML IJ SOLN
INTRAMUSCULAR | Status: AC
  Filled 2021-04-15: qty 2

## 2021-04-15 MED ORDER — LACTATED RINGERS IV SOLN: INTRAVENOUS | Status: DC

## 2021-04-15 MED ORDER — MIDAZOLAM HCL 2 MG/2ML IJ SOLN
INTRAMUSCULAR | Status: AC
  Filled 2021-04-15: qty 2

## 2021-04-15 MED ORDER — DEXAMETHASONE SODIUM PHOSPHATE 10 MG/ML IJ SOLN
INTRAMUSCULAR | Status: DC | PRN
  Administered 2021-04-15: 5 mg

## 2021-04-15 SURGICAL SUPPLY — 42 items
APL SKNCLS STERI-STRIP NONHPOA (GAUZE/BANDAGES/DRESSINGS) ×1
BENZOIN TINCTURE PRP APPL 2/3 (GAUZE/BANDAGES/DRESSINGS) ×3 IMPLANT
BLADE SURG 15 STRL LF DISP TIS (BLADE) ×1 IMPLANT
BLADE SURG 15 STRL SS (BLADE) ×3
BNDG CMPR 9X4 STRL LF SNTH (GAUZE/BANDAGES/DRESSINGS) ×1
BNDG ELASTIC 4X5.8 VLCR STR LF (GAUZE/BANDAGES/DRESSINGS) ×3 IMPLANT
BNDG ESMARK 4X9 LF (GAUZE/BANDAGES/DRESSINGS) ×2 IMPLANT
BNDG GAUZE ELAST 4 BULKY (GAUZE/BANDAGES/DRESSINGS) ×3 IMPLANT
CLOSURE WOUND 1/2 X4 (GAUZE/BANDAGES/DRESSINGS) ×1
CORD BIPOLAR FORCEPS 12FT (ELECTRODE) ×2 IMPLANT
COVER BACK TABLE 60X90IN (DRAPES) ×3 IMPLANT
CUFF TOURN SGL QUICK 18X4 (TOURNIQUET CUFF) IMPLANT
DRAPE EXTREMITY T 121X128X90 (DISPOSABLE) ×3 IMPLANT
DRAPE SURG 17X23 STRL (DRAPES) ×3 IMPLANT
DURAPREP 26ML APPLICATOR (WOUND CARE) ×3 IMPLANT
GAUZE SPONGE 4X4 12PLY STRL (GAUZE/BANDAGES/DRESSINGS) ×3 IMPLANT
GLOVE SURG SYN 8.0 (GLOVE) ×15 IMPLANT
GLOVE SURG SYN 8.0 PF PI (GLOVE) ×2 IMPLANT
GOWN STRL REIN XL XLG (GOWN DISPOSABLE) ×6 IMPLANT
GOWN STRL REUS W/ TWL LRG LVL3 (GOWN DISPOSABLE) ×1 IMPLANT
GOWN STRL REUS W/TWL LRG LVL3 (GOWN DISPOSABLE) ×3
NDL HYPO 25X1 1.5 SAFETY (NEEDLE) IMPLANT
NEEDLE HYPO 25X1 1.5 SAFETY (NEEDLE) IMPLANT
NS IRRIG 1000ML POUR BTL (IV SOLUTION) ×3 IMPLANT
PACK BASIN DAY SURGERY FS (CUSTOM PROCEDURE TRAY) ×3 IMPLANT
PAD CAST 3X4 CTTN HI CHSV (CAST SUPPLIES) ×1 IMPLANT
PADDING CAST ABS 4INX4YD NS (CAST SUPPLIES) ×2
PADDING CAST ABS COTTON 4X4 ST (CAST SUPPLIES) ×1 IMPLANT
PADDING CAST COTTON 3X4 STRL (CAST SUPPLIES) ×3
SHEET MEDIUM DRAPE 40X70 STRL (DRAPES) ×5 IMPLANT
SLING ARM FOAM STRAP LRG (SOFTGOODS) ×2 IMPLANT
STOCKINETTE 4X48 STRL (DRAPES) ×3 IMPLANT
STRIP CLOSURE SKIN 1/2X4 (GAUZE/BANDAGES/DRESSINGS) ×2 IMPLANT
SUT ETHILON 4 0 PS 2 18 (SUTURE) ×2 IMPLANT
SUT PROLENE 3 0 PS 2 (SUTURE) ×3 IMPLANT
SUT VICRYL 4-0 PS2 18IN ABS (SUTURE) ×2 IMPLANT
SUT VICRYL RAPIDE 4-0 (SUTURE) IMPLANT
SUT VICRYL RAPIDE 4/0 PS 2 (SUTURE) IMPLANT
SYR 10ML LL (SYRINGE) IMPLANT
SYR BULB EAR ULCER 3OZ GRN STR (SYRINGE) ×3 IMPLANT
TOWEL GREEN STERILE FF (TOWEL DISPOSABLE) ×3 IMPLANT
UNDERPAD 30X36 HEAVY ABSORB (UNDERPADS AND DIAPERS) ×3 IMPLANT

## 2021-04-15 NOTE — Anesthesia Preprocedure Evaluation (Signed)
Anesthesia Evaluation    Reviewed: Allergy & Precautions, Patient's Chart, lab work & pertinent test results  Airway Mallampati: III  TM Distance: >3 FB Neck ROM: Full    Dental no notable dental hx. (+) Dental Advisory Given   Pulmonary sleep apnea ,    Pulmonary exam normal        Cardiovascular hypertension, + CAD  Normal cardiovascular exam     Neuro/Psych negative neurological ROS     GI/Hepatic Neg liver ROS, GERD  ,  Endo/Other  diabetes  Renal/GU negative Renal ROS     Musculoskeletal negative musculoskeletal ROS (+)   Abdominal   Peds  Hematology negative hematology ROS (+)   Anesthesia Other Findings   Reproductive/Obstetrics                             Anesthesia Physical Anesthesia Plan  ASA: 3  Anesthesia Plan: MAC and Regional   Post-op Pain Management:    Induction:   PONV Risk Score and Plan: 2 and Ondansetron and Propofol infusion  Airway Management Planned: Natural Airway  Additional Equipment:   Intra-op Plan:   Post-operative Plan:   Informed Consent:   Plan Discussed with: Anesthesiologist  Anesthesia Plan Comments:         Anesthesia Quick Evaluation

## 2021-04-15 NOTE — H&P (Signed)
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  Past Medical History:  Diagnosis Date   Chronic combined systolic and diastolic CHF (congestive heart failure) (HCC)    Coronary atherosclerosis of native coronary artery    Dental crowns present    Essential hypertension, benign    states under control with meds., has been on med. x 15 yr.   MI (myocardial infarction) ([place]) 10/27/2002   Non-insulin dependent type 2 diabetes mellitus ([place])    Obesity    Osteoarthritis    hips and knees   Pure hypercholesterolemia    Sleep apnea     Past Surgical History:  Procedure Laterality Date   CARDIAC CATHETERIZATION  10/27/2002   CARPAL TUNNEL RELEASE Right 11/21/2013   CORONARY STENT PLACEMENT  10/27/2002   mid LAD   TOTAL HIP ARTHROPLASTY Left 04/09/1999   TOTAL HIP ARTHROPLASTY Right    TOTAL KNEE ARTHROPLASTY Left 02/03/2011   ULNAR TUNNEL RELEASE Left 06/18/2016   Procedure: LEFT CUBITAL TUNNEL RELEASE;  Surgeon: [name], MD;  Location: [facility];  Service: Orthopedics;  Laterality: Left;    Family History  Problem Relation Age of Onset   Healthy Mother    Healthy Father    Social History:  reports that he has never smoked. He has never used smokeless tobacco. He reports current alcohol use. He reports that he does not use drugs.  Allergies: No Known Allergies  Medications Prior to Admission  Medication Sig Dispense Refill   amLODipine (NORVASC) 5 MG tablet TAKE 1 TABLET BY MOUTH ONCE DAILY 90 tablet 0   atorvastatin (LIPITOR) 40 MG tablet TAKE 1 TABLET BY MOUTH DAILY AT BEDTIME 90 tablet 0   calcium carbonate (TUMS EX) 750 MG  chewable tablet Chew 2 tablets by mouth as needed for heartburn.     carvedilol (COREG) 25 MG tablet TAKE 1 TABLET BY MOUTH TWO TIMES DAILY WITH A MEAL 180 tablet 0   clopidogrel (PLAVIX) 75 MG tablet Take 1 tablet by mouth once daily 90 tablet 2   dapagliflozin propanediol (FARXIGA) 10 MG TABS tablet Take 1 tablet by mouth once daily 90 tablet 2   metFORMIN (GLUCOPHAGE-XR) 500 MG 24 hr tablet Take 2 tablets by mouth twice daily 360 tablet 2   naproxen sodium (ALEVE) 220 MG tablet Take 220 mg by mouth as needed (pain).     ramipril (ALTACE) 10 MG capsule Take 10 mg by mouth 2 (two) times daily.      triamterene-hydrochlorothiazide (DYAZIDE) 37.5-25 MG capsule TAKE 1 CAPSULE BY MOUTH ONCE A DAY IN THE MORNING 90 capsule 0   Dulaglutide (TRULICITY) 3 JF/3.5KT SOPN Inject 3 mg Subcutaneously once a week 6 mL 2   ramipril (ALTACE) 10 MG capsule Take 1 capsule (10 mg total) by mouth 2 (two) times daily. 625 capsule 2   TRULICITY 3 WL/8.9HT SOPN Inject 3 mg into the skin once a week.   3    Results for orders placed or performed during the hospital encounter of 04/15/21 (from the past 48 hour(s))  Basic metabolic panel per protocol     Status: Abnormal   Collection Time: 04/14/21  9:38 AM  Result Value Ref Range  Sodium 141 135 - 145 mmol/L   Potassium 4.4 3.5 - 5.1 mmol/L   Chloride 104 98 - 111 mmol/L   CO2 26 22 - 32 mmol/L   Glucose, Bld 151 (H) 70 - 99 mg/dL    Comment: Glucose reference range applies only to samples taken after fasting for at least 8 hours.   BUN 11 8 - 23 mg/dL   Creatinine, Ser 0.83 0.61 - 1.24 mg/dL   Calcium 8.8 (L) 8.9 - 10.3 mg/dL   GFR, Estimated >60 >60 mL/min    Comment: (NOTE) Calculated using the CKD-EPI Creatinine Equation (2021)    Anion gap 11 5 - 15    Comment: Performed at [facility] [address] [place], [place] [address]  Glucose, capillary     Status: Abnormal   Collection Time: 04/15/21  1:08 PM  Result Value Ref Range    Glucose-Capillary 122 (H) 70 - 99 mg/dL    Comment: Glucose reference range applies only to samples taken after fasting for at least 8 hours.   No results found.  Review of Systems  All other systems reviewed and are negative.  Blood pressure 107/83, pulse 86, temperature 98.1 F (36.7 C), temperature source Oral, resp. rate 17, height 5\' 5"  (1.651 m), weight 100.5 kg, SpO2 98 %. Physical Exam Constitutional:      Appearance: Normal appearance.  HENT:     Head: Normocephalic and atraumatic.  Eyes:     Pupils: Pupils are equal, round, and reactive to light.  Cardiovascular:     Rate and Rhythm: Normal rate.  Pulmonary:     Effort: Pulmonary effort is normal.  Musculoskeletal:     Right elbow: Tenderness present in medial epicondyle.     Right wrist: Tenderness present.     Comments: Positive Tinel's medial aspect right elbow and positive median nerve compression test 35 seconds on the right side  Skin:    General: Skin is warm.  Neurological:     General: No focal deficit present.     Mental Status: He is alert and oriented to person, place, and time.  Psychiatric:        Mood and Affect: Mood normal.        Behavior: Behavior normal.        Thought Content: Thought content normal.        Judgment: Judgment normal.     Assessment/Plan 70 year old male right-hand-dominant persistent progressive cubital tunnel and carpal tunnel syndrome on the right side.  Have discussed the role of exploration with decompression of the ulnar nerve at the medial aspect of the elbow on the right with possible transposition as necessary as well as redo carpal tunnel release on the right with possible extension of the incision across the wrist crease as necessary.  Patient understands the risks and benefits and wishes to proceed.  He also understands we cannot guarantee complete pain relief and the may be need for further surgery in the future.  This will be done as an outpatient with discharge to  home with p.o. pain medications.  Schuyler Amor, MD 04/15/2021, 2:29 PM

## 2021-04-15 NOTE — Brief Op Note (Signed)
04/15/2021  3:55 PM  PATIENT:  Ryan Bombard, MD  70 y.o. male  PRE-OPERATIVE DIAGNOSIS:  right carpal tunnel syndrome ,right cubital tunnel  POST-OPERATIVE DIAGNOSIS:  right carpal tunnel syndrome ,right cubital tunnel  PROCEDURE:  Procedure(s) with comments: CARPAL TUNNEL RELEASE (Right) ANTERIOR INTEROSSEOUS NERVE DECOMPRESSION (Right) - Only need 1 hour for case,  Axillary block/Mac  SURGEON:  Surgeon(s) and Role:    [name], MD - Primary  PHYSICIAN ASSISTANT:   ASSISTANTS: Zach Currence, PA-C  ANESTHESIA:   regional  EBL: Minimal  BLOOD ADMINISTERED:none  DRAINS: none   LOCAL MEDICATIONS USED:  NONE  SPECIMEN:  No Specimen  DISPOSITION OF SPECIMEN:  N/A  COUNTS:  YES  TOURNIQUET:  * Missing tourniquet times found for documented tourniquets in log: [id number] *  DICTATION: .Viviann Spare Dictation  PLAN OF CARE: Discharge to home after PACU  PATIENT DISPOSITION:  PACU - hemodynamically stable.   Delay start of Pharmacological VTE agent (>24hrs) due to surgical blood loss or risk of bleeding: yes

## 2021-04-15 NOTE — Transfer of Care (Signed)
Immediate Anesthesia Transfer of Care Note  Patient: Ryan Bombard, Ryan Walsh  Procedure(s) Performed: CARPAL TUNNEL RELEASE (Right) ANTERIOR INTEROSSEOUS NERVE DECOMPRESSION (Right)  Patient Location: PACU  Anesthesia Type:MAC combined with regional for post-op pain  Level of Consciousness: awake, alert , oriented and patient cooperative  Airway & Oxygen Therapy: Patient Spontanous Breathing and Patient connected to face mask oxygen  Post-op Assessment: Report given to RN and Post -op Vital signs reviewed and stable  Post vital signs: Reviewed and stable  Last Vitals:  Vitals Value Taken Time  BP    Temp    Pulse 84 04/15/21 1552  Resp    SpO2 91 % 04/15/21 1552  Vitals shown include unvalidated device data.  Last Pain:  Vitals:   04/15/21 1312  TempSrc: Oral  PainSc: 0-No pain         Complications: No notable events documented.

## 2021-04-15 NOTE — Anesthesia Procedure Notes (Signed)
Anesthesia Regional Block: Supraclavicular block   Pre-Anesthetic Checklist: , timeout performed,  Correct Patient, Correct Site, Correct Laterality,  Correct Procedure, Correct Position, site marked,  Risks and benefits discussed,  Surgical consent,  Pre-op evaluation,  At surgeon's request and post-op pain management  Laterality: Right  Prep: chloraprep       Needles:  Injection technique: Single-shot  Needle Type: Echogenic Stimulator Needle     Needle Length: 5cm  Needle Gauge: 22     Additional Needles:   Narrative:  Start time: 04/15/2021 1:51 PM End time: 04/15/2021 1:41 PM Injection made incrementally with aspirations every 5 mL.  Performed by: Personally  Anesthesiologist: Duane Boston, MD  Additional Notes: Functioning IV was confirmed and monitors applied.  A 58mm 22ga echogenic arrow stimulator was used. Sterile prep and drape,hand hygiene and sterile gloves were used.Ultrasound guidance: relevant anatomy identified, needle position confirmed, local anesthetic spread visualized around nerve(s)., vascular puncture avoided.  Image printed for medical record.  Negative aspiration and negative test dose prior to incremental administration of local anesthetic. The patient tolerated the procedure well.

## 2021-04-15 NOTE — Progress Notes (Signed)
Assisted Dr. Singer with right, ultrasound guided, supraclavicular block. Side rails up, monitors on throughout procedure. See vital signs in flow sheet. Tolerated Procedure well. 

## 2021-04-15 NOTE — Anesthesia Procedure Notes (Signed)
Procedure Name: MAC Date/Time: 04/15/2021 2:59 PM Performed by: Signe Colt, CRNA Pre-anesthesia Checklist: Patient identified, Emergency Drugs available, Suction available, Patient being monitored and Timeout performed Patient Re-evaluated:Patient Re-evaluated prior to induction Oxygen Delivery Method: Simple face mask

## 2021-04-15 NOTE — Discharge Instructions (Signed)

## 2021-04-15 NOTE — Op Note (Signed)
Patient was taken the operating suite and after induction adequate regional anesthetic and IV sedation the right upper extremity was prepped and draped in the usual sterile fashion.  An Esmarch was used to exsanguinate the limb and the tourniquet was inflated 2 and 50 mmHg.  At this point time incision made in the palmar aspect of the right hand in line with the long metacarpal starting Kaplan's cardinal line and going to the palmar wrist crease and extended in Roberts fashion paralleling the palmaris longus tendon.  Dissection was carried down to the median nerve that was then fired proximal to the carpal tunnel and traced to the carpal tunnel.  We unroofed the carpal tunnel and saw significant scarring of the median nerve to the radial leaflet of the previously divided transverse carpal ligament.  An external neurolysis under loupe magnification was performed a significant of scar tissue of the epineurial type was removed from the median nerve until normal fascicles were identified.  There was an hourglass configuration proximally.  This was thoroughly decompressed throughout the entire length of the incision.  This wound was thoroughly irrigated and loosely closed with 4-0 nylon.  A second incision made in the medial aspect of the elbow on the right centered to the olecranon process and the tip of the medial epicondyle.  We identified the ulnar nerve at the edge of the cubital tunnel and released it to the length of the cubital tunnel and distally including release of the fascia overlying the flexor carpi ulnaris muscle.  Proximally released under the skin bridge for 6 to 8 cm including release of intermuscular septum off the medial epicondyle.  All pressure points were relieved.  The nerve was stable in the groove.  This wound was irrigated loosely closed in layers of 2-0 undyed Vicryl followed by 3-0 Prolene subcuticular stitch.  Steri-Strips, 4 x 4's, and a palmar splint was applied.  The patient tolerated this  procedure well went to recovery room in stable fashion.

## 2021-04-16 NOTE — Anesthesia Postprocedure Evaluation (Signed)
Anesthesia Post Note  Patient: Ryan Bombard, MD  Procedure(s) Performed: CARPAL TUNNEL RELEASE (Right) ANTERIOR INTEROSSEOUS NERVE DECOMPRESSION (Right)     Patient location during evaluation: PACU Anesthesia Type: Regional and MAC Level of consciousness: awake and alert Pain management: pain level controlled Vital Signs Assessment: post-procedure vital signs reviewed and stable Respiratory status: spontaneous breathing and respiratory function stable Cardiovascular status: stable Postop Assessment: no apparent nausea or vomiting Anesthetic complications: no   No notable events documented.  Last Vitals:  Vitals:   04/15/21 1639 04/15/21 1705  BP:  (!) 135/93  Pulse:  91  Resp:  20  Temp:  (!) 36.3 C  SpO2: 94% 94%    Last Pain:  Vitals:   04/15/21 1705  TempSrc: Oral  PainSc: 0-No pain                 Jarred Purtee DANIEL

## 2021-04-20 ENCOUNTER — Encounter (HOSPITAL_BASED_OUTPATIENT_CLINIC_OR_DEPARTMENT_OTHER): Payer: Self-pay | Admitting: Orthopedic Surgery

## 2021-04-24 ENCOUNTER — Ambulatory Visit: Payer: 59 | Admitting: Podiatry

## 2021-04-24 ENCOUNTER — Other Ambulatory Visit: Payer: Self-pay

## 2021-04-24 DIAGNOSIS — E1151 Type 2 diabetes mellitus with diabetic peripheral angiopathy without gangrene: Secondary | ICD-10-CM

## 2021-04-24 DIAGNOSIS — E1169 Type 2 diabetes mellitus with other specified complication: Secondary | ICD-10-CM | POA: Diagnosis not present

## 2021-04-24 DIAGNOSIS — B351 Tinea unguium: Secondary | ICD-10-CM | POA: Diagnosis not present

## 2021-04-24 NOTE — Progress Notes (Signed)
  Subjective:  Patient ID: Ryan Bombard, MD, male    DOB: [birth year],  MRN: [id number]  Chief Complaint  Patient presents with   Nail Problem     Routine foot care    70 y.o. male presents with the above complaint. History confirmed with patient. Denies new pedal issues.  Objective:  Physical Exam: warm, good capillary refill, nail exam onychomycosis of the toenails, no trophic changes or ulcerative lesions. DP pulses palpable and protective sensation intact. PT absent bilat. Left Foot: normal exam, no swelling, tenderness, instability; ligaments intact, full range of motion of all ankle/foot joints  Right Foot: normal exam, no swelling, tenderness, instability; ligaments intact, full range of motion of all ankle/foot joints   No images are attached to the encounter.  Assessment:   1. Onychomycosis of multiple toenails with type 2 diabetes mellitus and peripheral angiopathy ([place])    Plan:  Patient was evaluated and treated and all questions answered.  Onychomycosis, Diabetes and PAD -Patient is diabetic with a qualifying condition for at risk foot care.  Procedure: Nail Debridement Type of Debridement: manual, sharp debridement. Instrumentation: Nail nipper, rotary burr. Number of Nails: 10  Return in about 9 weeks (around 06/26/2021) for Diabetic Foot Care.

## 2021-04-29 NOTE — Progress Notes (Addendum)
COVID swab appointment: 05-07-21  COVID Vaccine Completed:  Yes x2 Date COVID Vaccine completed: 07-03-19 07-24-19 Has received booster:  Yes x2 03-13-21 COVID vaccine manufacturer: [place]     Date of COVID positive in last 90 days:  No  PCP - Marton Redwood, MD Cardiologist - Daneen Schick, MD  Cardiac clearance in Epic dated 02-25-21 Dayna Dunn PA-C  Chest x-ray - N/A EKG - 02-16-21 Epic Stress Test - greater than 2 years, Epic ECHO - 03-28-20 Epic Cardiac Cath - greater than 5 years Pacemaker/ICD device last checked: Spinal Cord Stimulator:  Sleep Study - Yes, +sleep apnea CPAP - Yes  Fasting Blood Sugar -  Checks Blood Sugar - does not check   Blood Thinner Instructions: Plavix.  Patient will check to see how many days he needs to hold it but is aware it needs to be held Aspirin Instructions: Last Dose:  Activity level:  Can go up a flight of stairs and perform activities of daily living without stopping and without symptoms of chest pain or shortness of breath.  Some limitations due to joint pain.  Works full-time as a Engineer, maintenance review: CAD, Hx of MI with stent placement, AVNRT, CHF, HTN  Patient denies shortness of breath, fever, cough and chest pain at PAT appointment   Patient verbalized understanding of instructions that were given to them at the PAT appointment. Patient was also instructed that they will need to review over the PAT instructions again at home before surgery.

## 2021-04-29 NOTE — Patient Instructions (Addendum)
DUE TO COVID-19 ONLY ONE VISITOR IS ALLOWED TO COME WITH YOU AND STAY IN THE WAITING ROOM ONLY DURING PRE OP AND PROCEDURE.   **NO VISITORS ARE ALLOWED IN THE SHORT STAY AREA OR RECOVERY ROOM!!**  IF YOU WILL BE ADMITTED INTO THE HOSPITAL YOU ARE ALLOWED ONLY TWO SUPPORT PEOPLE DURING VISITATION HOURS ONLY (7 AM -8PM)    Up to two visitors ages 62+ are allowed at one time in a patient's room.  The visitors may rotate out with other people throughout the day.  Additionally, up to two children between the ages of 71 and 31 are allowed and do not count toward the number of allowed visitors.  Children within this age range must be accompanied by an adult visitor.  One adult visitor may remain with the patient overnight and must be in the room by 8 PM.  COVID SWAB TESTING MUST BE COMPLETED ON:  Thursday 05-07-21 Between the hours of 8 and 3  **MUST PRESENT COMPLETED FORM AT TESTING SITE**    [place] (backside of the building)  You are not required to quarantine, however you are required to wear a well-fitted mask when you are out and around people not in your household.  Hand Hygiene often Do NOT share personal items Notify your provider if you are in close contact with someone who has COVID or you develop fever 100.4 or greater, new onset of sneezing, cough, sore throat, shortness of breath or body aches.       Your procedure is scheduled on: Monday 05-11-21   Report to [facility] Main  Entrance     Report to admitting at 10:00 AM   Call this number if you have problems the morning of surgery [phone]   Do not eat food :After Midnight.   May have liquids until 9:30 AM day of surgery  CLEAR LIQUID DIET  Foods Allowed                                                                     Foods Excluded  Water, Black Coffee (no milk/no creamer) and tea, regular and decaf                              liquids that you cannot  Plain Jell-O in any  flavor  (No red)                         see through such as: Fruit ices (not with fruit pulp)                                 milk, soups, orange juice  Iced Popsicles (No red)                                    All solid food                             Apple juices Sports drinks  like Gatorade (No red) Lightly seasoned clear broth or consume(fat free) Sugar     Complete one G2 drink the morning of surgery at 9:30 AM the day of surgery.       The day of surgery:  Drink ONE (1) Pre-Surgery Clear G2 the morning of surgery. Drink in one sitting. Do not sip.  This drink was given to you during your hospital  pre-op appointment visit. Nothing else to drink after completing the  G2.          If you have questions, please contact your surgeon's office.     Oral Hygiene is also important to reduce your risk of infection.                                    Remember - BRUSH YOUR TEETH THE MORNING OF SURGERY WITH YOUR REGULAR TOOTHPASTE   Do NOT smoke after Midnight   Take these medicines the morning of surgery with A SIP OF WATER:  Amlodipine, Carvedilol  How to Manage Your Diabetes Before and After Surgery  Why is it important to control my blood sugar before and after surgery? Improving blood sugar levels before and after surgery helps healing and can limit problems. A way of improving blood sugar control is eating a healthy diet by:  Eating less sugar and carbohydrates  Increasing activity/exercise  Talking with your doctor about reaching your blood sugar goals High blood sugars (greater than 180 mg/dL) can raise your risk of infections and slow your recovery, so you will need to focus on controlling your diabetes during the weeks before surgery. Make sure that the doctor who takes care of your diabetes knows about your planned surgery including the date and location.  How do I manage my blood sugar before surgery? Check your blood sugar at least 4 times a day, starting 2 days  before surgery, to make sure that the level is not too high or low. Check your blood sugar the morning of your surgery when you wake up and every 2 hours until you get to the Short Stay unit. If your blood sugar is less than 70 mg/dL, you will need to treat for low blood sugar: Do not take insulin. Treat a low blood sugar (less than 70 mg/dL) with  cup of clear juice (cranberry or apple), 4 glucose tablets, OR glucose gel. Recheck blood sugar in 15 minutes after treatment (to make sure it is greater than 70 mg/dL). If your blood sugar is not greater than 70 mg/dL on recheck, call [phone] for further instructions. Report your blood sugar to the short stay nurse when you get to Short Stay.  If you are admitted to the hospital after surgery: Your blood sugar will be checked by the staff and you will probably be given insulin after surgery (instead of oral diabetes medicines) to make sure you have good blood sugar levels. The goal for blood sugar control after surgery is 80-180 mg/dL.   WHAT DO I DO ABOUT MY DIABETES MEDICATION?  Do not take oral diabetes medicines (pills) the morning of surgery.  THE DAY BEFORE SURGERY:  Do not take [place].  Take Metformin as prescribed.  Take Trulicity as prescribed.      THE MORNING OF SURGERY:  Do not take Farxiga, Metformin or Trulicity  Reviewed and Endorsed by [facility] Patient Education Committee, August 2015  Plavix - check with physician to see how many days to hold   Stop all vitamins and herbal supplements a week before surgery   Bring CPAP mask and tubing day of surgery             You may not have any metal on your body including  jewelry, and body piercing             Do not wear lotions, powders, cologne, or deodorant              Men may shave face and neck.  Do not bring valuables to the hospital. [place].   Contacts, dentures or bridgework may not be worn into  surgery.   Bring small overnight bag day of surgery.  Please read over the following fact sheets you were given: IF YOU HAVE QUESTIONS ABOUT YOUR PRE OP INSTRUCTIONS PLEASE CALL [phone]   [place] - Preparing for Surgery Before surgery, you can play an important role.  Because skin is not sterile, your skin needs to be as free of germs as possible.  You can reduce the number of germs on your skin by washing with CHG (chlorahexidine gluconate) soap before surgery.  CHG is an antiseptic cleaner which kills germs and bonds with the skin to continue killing germs even after washing. Please DO NOT use if you have an allergy to CHG or antibacterial soaps.  If your skin becomes reddened/irritated stop using the CHG and inform your nurse when you arrive at Short Stay. Do not shave (including legs and underarms) for at least 48 hours prior to the first CHG shower.  You may shave your face/neck.  Please follow these instructions carefully:  1.  Shower with CHG Soap the night before surgery and the  morning of surgery.  2.  If you choose to wash your hair, wash your hair first as usual with your normal  shampoo.  3.  After you shampoo, rinse your hair and body thoroughly to remove the shampoo.                             4.  Use CHG as you would any other liquid soap.  You can apply chg directly to the skin and wash.  Gently with a scrungie or clean washcloth.  5.  Apply the CHG Soap to your body ONLY FROM THE NECK DOWN.   Do   not use on face/ open                           Wound or open sores. Avoid contact with eyes, ears mouth and   genitals (private parts).                       Wash face,  Genitals (private parts) with your normal soap.             6.  Wash thoroughly, paying special attention to the area where your    surgery  will be performed.  7.  Thoroughly rinse your body with warm water from the neck down.  8.  DO NOT shower/wash with your normal soap after using and rinsing off the  CHG Soap.                9.  Pat yourself dry with a clean towel.  10.  Wear clean pajamas.            11.  Place clean sheets on your bed the night of your first shower and do not  sleep with pets. Day of Surgery : Do not apply any lotions/deodorants the morning of surgery.  Please wear clean clothes to the hospital/surgery center.  FAILURE TO FOLLOW THESE INSTRUCTIONS MAY RESULT IN THE CANCELLATION OF YOUR SURGERY  PATIENT SIGNATURE_________________________________  NURSE SIGNATURE__________________________________  ________________________________________________________________________   Adam Phenix  An incentive spirometer is a tool that can help keep your lungs clear and active. This tool measures how well you are filling your lungs with each breath. Taking long deep breaths may help reverse or decrease the chance of developing breathing (pulmonary) problems (especially infection) following: A long period of time when you are unable to move or be active. BEFORE THE PROCEDURE  If the spirometer includes an indicator to show your best effort, your nurse or respiratory therapist will set it to a desired goal. If possible, sit up straight or lean slightly forward. Try not to slouch. Hold the incentive spirometer in an upright position. INSTRUCTIONS FOR USE  Sit on the edge of your bed if possible, or sit up as far as you can in bed or on a chair. Hold the incentive spirometer in an upright position. Breathe out normally. Place the mouthpiece in your mouth and seal your lips tightly around it. Breathe in slowly and as deeply as possible, raising the piston or the ball toward the top of the column. Hold your breath for 3-5 seconds or for as long as possible. Allow the piston or ball to fall to the bottom of the column. Remove the mouthpiece from your mouth and breathe out normally. Rest for a few seconds and repeat Steps 1 through 7 at least 10 times every 1-2 hours  when you are awake. Take your time and take a few normal breaths between deep breaths. The spirometer may include an indicator to show your best effort. Use the indicator as a goal to work toward during each repetition. After each set of 10 deep breaths, practice coughing to be sure your lungs are clear. If you have an incision (the cut made at the time of surgery), support your incision when coughing by placing a pillow or rolled up towels firmly against it. Once you are able to get out of bed, walk around indoors and cough well. You may stop using the incentive spirometer when instructed by your caregiver.  RISKS AND COMPLICATIONS Take your time so you do not get dizzy or light-headed. If you are in pain, you may need to take or ask for pain medication before doing incentive spirometry. It is harder to take a deep breath if you are having pain. AFTER USE Rest and breathe slowly and easily. It can be helpful to keep track of a log of your progress. Your caregiver can provide you with a simple table to help with this. If you are using the spirometer at home, follow these instructions: [place] IF:  You are having difficultly using the spirometer. You have trouble using the spirometer as often as instructed. Your pain medication is not giving enough relief while using the spirometer. You develop fever of 100.5 F (38.1 C) or higher. SEEK IMMEDIATE MEDICAL CARE IF:  You cough up bloody sputum that had not been present before. You develop fever of 102 F (38.9 C) or greater. You develop worsening pain at or  near the incision site. MAKE SURE YOU:  Understand these instructions. Will watch your condition. Will get help right away if you are not doing well or get worse. Document Released: 09/20/2006 Document Revised: 08/02/2011 Document Reviewed: 11/21/2006 [facility] Patient Information 2014 [place], [place].   ________________________________________________________________________

## 2021-05-01 ENCOUNTER — Other Ambulatory Visit: Payer: Self-pay

## 2021-05-01 ENCOUNTER — Encounter (HOSPITAL_COMMUNITY)
Admission: RE | Admit: 2021-05-01 | Discharge: 2021-05-01 | Disposition: A | Discharge status: Home / Self Care | Payer: 59 | Source: Ambulatory Visit | Attending: Orthopedic Surgery | Admitting: Orthopedic Surgery

## 2021-05-01 ENCOUNTER — Encounter (HOSPITAL_COMMUNITY): Payer: Self-pay

## 2021-05-01 VITALS — BP 118/82 | HR 99 | Temp 98.4°F | Resp 18 | Ht 65.0 in | Wt 211.0 lb

## 2021-05-01 DIAGNOSIS — I5042 Chronic combined systolic (congestive) and diastolic (congestive) heart failure: Secondary | ICD-10-CM | POA: Diagnosis not present

## 2021-05-01 DIAGNOSIS — Z955 Presence of coronary angioplasty implant and graft: Secondary | ICD-10-CM | POA: Diagnosis not present

## 2021-05-01 DIAGNOSIS — G4730 Sleep apnea, unspecified: Secondary | ICD-10-CM | POA: Insufficient documentation

## 2021-05-01 DIAGNOSIS — M1711 Unilateral primary osteoarthritis, right knee: Secondary | ICD-10-CM | POA: Diagnosis not present

## 2021-05-01 DIAGNOSIS — Z01818 Encounter for other preprocedural examination: Secondary | ICD-10-CM

## 2021-05-01 DIAGNOSIS — I110 Hypertensive heart disease with heart failure: Secondary | ICD-10-CM | POA: Insufficient documentation

## 2021-05-01 DIAGNOSIS — Z01812 Encounter for preprocedural laboratory examination: Secondary | ICD-10-CM | POA: Diagnosis not present

## 2021-05-01 DIAGNOSIS — E119 Type 2 diabetes mellitus without complications: Secondary | ICD-10-CM | POA: Diagnosis not present

## 2021-05-01 HISTORY — DX: Inflammatory liver disease, unspecified: K75.9

## 2021-05-01 HISTORY — DX: Gastro-esophageal reflux disease without esophagitis: K21.9

## 2021-05-01 LAB — COMPREHENSIVE METABOLIC PANEL
ALT: 22 U/L (ref 0–44)
AST: 22 U/L (ref 15–41)
Albumin: 4.2 g/dL (ref 3.5–5.0)
Alkaline Phosphatase: 65 U/L (ref 38–126)
Anion gap: 9 (ref 5–15)
BUN: 17 mg/dL (ref 8–23)
CO2: 27 mmol/L (ref 22–32)
Calcium: 8.8 mg/dL — ABNORMAL LOW (ref 8.9–10.3)
Chloride: 100 mmol/L (ref 98–111)
Creatinine, Ser: 0.87 mg/dL (ref 0.61–1.24)
GFR, Estimated: >60 mL/min (ref >60)
Glucose, Bld: 160 mg/dL — ABNORMAL HIGH (ref 70–99)
Potassium: 4 mmol/L (ref 3.5–5.1)
Sodium: 136 mmol/L (ref 135–145)
Total Bilirubin: 0.8 mg/dL (ref 0.3–1.2)
Total Protein: 6.8 g/dL (ref 6.5–8.1)

## 2021-05-01 LAB — CBC
HCT: 40.3 % (ref 39.0–52.0)
Hemoglobin: 12.9 g/dL — ABNORMAL LOW (ref 13.0–17.0)
MCH: 28.5 pg (ref 26.0–34.0)
MCHC: 32 g/dL (ref 30.0–36.0)
MCV: 89.2 fL (ref 80.0–100.0)
Platelets: 120 10*3/uL — ABNORMAL LOW (ref 150–400)
RBC: 4.52 MIL/uL (ref 4.22–5.81)
RDW: 13.8 % (ref 11.5–15.5)
WBC: 6.5 10*3/uL (ref 4.0–10.5)
nRBC: 0 % (ref 0.0–0.2)

## 2021-05-01 LAB — HEMOGLOBIN A1C
Hgb A1c MFr Bld: 7.1 % — ABNORMAL HIGH (ref 4.8–5.6)
Mean Plasma Glucose: 157.07 mg/dL

## 2021-05-01 LAB — SURGICAL PCR SCREEN
MRSA, PCR: NEGATIVE
Staphylococcus aureus: NEGATIVE

## 2021-05-01 LAB — PROTIME-INR
INR: 1.1 (ref 0.8–1.2)
Prothrombin Time: 14.4 seconds (ref 11.4–15.2)

## 2021-05-01 LAB — GLUCOSE, CAPILLARY: Glucose-Capillary: 166 mg/dL — ABNORMAL HIGH (ref 70–99)

## 2021-05-02 ENCOUNTER — Other Ambulatory Visit (HOSPITAL_COMMUNITY): Payer: Self-pay

## 2021-05-04 ENCOUNTER — Other Ambulatory Visit (HOSPITAL_COMMUNITY): Payer: Self-pay

## 2021-05-04 NOTE — Progress Notes (Addendum)
Anesthesia Chart Review   Case: [id number] Date/Time: 05/11/21 1225   Procedure: TOTAL KNEE ARTHROPLASTY (Right: Knee)   Anesthesia type: Choice   Pre-op diagnosis: right knee osteoarthritis   Location: [facility]   Surgeons: Gaynelle Arabian, MD       DISCUSSION:70 y.o. never smoker with h/o HTN, DM II, sleep apnea, CHF, CAD (stent), right knee OA scheduled for above procedure 05/11/2021 with Dr. Gaynelle Walsh.   S/p carpal tunnel release 04/15/2021, no anesthesia complications noted.   Prior to this cleared by cardiology. Per cardiology note 02/25/2021, "Chart reviewed as part of pre-operative protocol coverage. The patient was recently seen in clinic 02/16/21 and clinically doing well. Dr. Tamala Walsh was aware of potential upcoming knee surgery. The patient was not describing any ischemic symptoms at that visit. No further testing was advised. Therefore, given past medical history and time since last visit, based on ACC/AHA guidelines, Ryan Bombard, MD would be at acceptable risk for the planned procedure without further cardiovascular testing. Per Dr. Tamala Walsh, okay to hold Plavix for surgery. I will route this recommendation to the requesting party via Epic fax function and remove from pre-op pool."  Pt reported to PAT nurse he had not received instructions regarding Plavix. Discussed with Dr. Anne Walsh office.   Anticipate pt can proceed with planned procedure barring acute status change.   VS: BP 118/82   Pulse 99   Temp 36.9 C (Oral)   Resp 18   Ht _0  (1.651 m)   Wt 95.7 kg   SpO2 98%   BMI 35.11 kg/m   PROVIDERS: Ryan Walsh., MD is PCP   Ryan Schick, MD is Cardiologist  LABS: Labs reviewed: Acceptable for surgery. (all labs ordered are listed, but only abnormal results are displayed)  Labs Reviewed  CBC - Abnormal; Notable for the following components:      Result Value   Hemoglobin 12.9 (*)    Platelets 120 (*)    All other components within normal  limits  COMPREHENSIVE METABOLIC PANEL - Abnormal; Notable for the following components:   Glucose, Bld 160 (*)    Calcium 8.8 (*)    All other components within normal limits  HEMOGLOBIN A1C - Abnormal; Notable for the following components:   Hgb A1c MFr Bld 7.1 (*)    All other components within normal limits  GLUCOSE, CAPILLARY - Abnormal; Notable for the following components:   Glucose-Capillary 166 (*)    All other components within normal limits  SURGICAL PCR SCREEN  PROTIME-INR     IMAGES:   EKG: 02/16/2021 Rate 96 bpm    CV: Echo 03/28/2020  1. Left ventricular ejection fraction, by estimation, is 45 to 50%. The  left ventricle has mildly reduced function. The left ventricle has no  regional wall motion abnormalities. There is mild concentric hypertrophy  with focal moderate basal-septal  hypertrophy.Left ventricular diastolic parameters are consistent with  Grade I diastolic dysfunction (impaired relaxation).   2. Right ventricular systolic function is normal. The right ventricular  size is normal.   3. The mitral valve is normal in structure. Trivial mitral valve  regurgitation.   4. The aortic valve is tricuspid. There is mild calcification of the  aortic valve. There is mild thickening of the aortic valve. Aortic valve  regurgitation is not visualized.   5. Aortic dilatation noted. There is mild dilatation of the aortic root,  measuring 41 mm. There is mild to moderate dilatation of the  ascending  aorta, measuring 41 mm.   6. The inferior vena cava is normal in size with <50% respiratory  variability, suggesting right atrial pressure of 8 mmHg  Myocardial Perfusion 04/28/2018 Nuclear stress EF: 41%. There was no ST segment deviation noted during stress. Defect 1: There is a medium defect of severe severity present in the apical anterior, apical septal, apical inferior and apex location. Findings consistent with prior myocardial infarction. This is a low risk  study secondary to LV dysfunction. The left ventricular ejection fraction is moderately decreased (30-44%).   There is a medium size irreversible defect in the apical anterior, septal, inferior walls and and in the true apex consistent with prior apical infarction but no peri-infarct ischemia. LVEF 41%.  Past Medical History:  Diagnosis Date   Chronic combined systolic and diastolic CHF (congestive heart failure) (HCC)    Coronary atherosclerosis of native coronary artery    Dental crowns present    Essential hypertension, benign    states under control with meds., has been on med. x 15 yr.   GERD (gastroesophageal reflux disease)    Hepatitis    Drug induced hepatitis   MI (myocardial infarction) ([place]) 10/27/2002   Non-insulin dependent type 2 diabetes mellitus ([place])    Obesity    Osteoarthritis    hips and knees   Pure hypercholesterolemia    Sleep apnea     Past Surgical History:  Procedure Laterality Date   ANTERIOR INTEROSSEOUS NERVE DECOMPRESSION Right 04/15/2021   Procedure: ANTERIOR INTEROSSEOUS NERVE DECOMPRESSION;  Surgeon: Ryan Crumb, MD;  Location: [facility];  Service: Orthopedics;  Laterality: Right;  Only need 1 hour for case,  Axillary block/Mac   CARDIAC CATHETERIZATION  10/27/2002   CARPAL TUNNEL RELEASE Right 11/21/2013   CARPAL TUNNEL RELEASE Right 04/15/2021   Procedure: CARPAL TUNNEL RELEASE;  Surgeon: Ryan Crumb, MD;  Location: [facility];  Service: Orthopedics;  Laterality: Right;   CORONARY STENT PLACEMENT  10/27/2002   mid LAD   TOTAL HIP ARTHROPLASTY Left 04/09/1999   TOTAL HIP ARTHROPLASTY Right    TOTAL KNEE ARTHROPLASTY Left 02/03/2011   ULNAR TUNNEL RELEASE Left 06/18/2016   Procedure: LEFT CUBITAL TUNNEL RELEASE;  Surgeon: Ryan Crumb, MD;  Location: [facility];  Service: Orthopedics;  Laterality: Left;    MEDICATIONS:  amLODipine (NORVASC) 5 MG tablet   atorvastatin (LIPITOR) 40  MG tablet   carvedilol (COREG) 25 MG tablet   clopidogrel (PLAVIX) 75 MG tablet   dapagliflozin propanediol (FARXIGA) 10 MG TABS tablet   Dulaglutide (TRULICITY) 3 ZS/8.2LM SOPN   metFORMIN (GLUCOPHAGE-XR) 500 MG 24 hr tablet   naproxen sodium (ALEVE) 220 MG tablet   naproxen sodium (ALEVE) 220 MG tablet   oxyCODONE-acetaminophen (PERCOCET) 5-325 MG tablet   ramipril (ALTACE) 10 MG capsule   triamterene-hydrochlorothiazide (DYAZIDE) 37.5-25 MG capsule   No current facility-administered medications for this encounter.    Ryan Felix Ward, PA-C [facility] [phone]

## 2021-05-04 NOTE — Telephone Encounter (Signed)
Ryan Walsh is calling wanting to know how long he is to hold his Plavix for this procedure due to not being advised.

## 2021-05-06 NOTE — Telephone Encounter (Signed)
Will route to callback to ask patient to review with his orthopedic surgeon. We usually defer to them how long they need to hold for the procedure as different practices use different instructions - from our standpoint he is cleared to hold 5-7 days.

## 2021-05-06 NOTE — Telephone Encounter (Signed)
Returned the call to the pt and he has been made aware our recommendations, but also aware that it is up to the Surgeon how long he would like for him to hold the Plavix.  Pt verbalized understanding.

## 2021-05-07 ENCOUNTER — Other Ambulatory Visit: Payer: Self-pay | Admitting: Orthopedic Surgery

## 2021-05-07 LAB — SARS CORONAVIRUS 2 (TAT 6-24 HRS): SARS Coronavirus 2: NEGATIVE

## 2021-05-08 NOTE — Anesthesia Preprocedure Evaluation (Addendum)
Anesthesia Evaluation  Patient identified by MRN, date of birth, ID band Patient awake    Reviewed: Allergy & Precautions, NPO status , Patient's Chart, lab work & pertinent test results, reviewed documented beta blocker date and time   History of Anesthesia Complications Negative for: history of anesthetic complications  Airway Mallampati: II  TM Distance: >3 FB Neck ROM: Full    Dental   Pulmonary sleep apnea ,    Pulmonary exam normal        Cardiovascular hypertension, Pt. on medications and Pt. on home beta blockers + CAD, + Past MI (2004), + Cardiac Stents (2004) and +CHF  Normal cardiovascular exam   Echo 03/28/20: EF 45-50%, no RWMA, mild LVH, g1dd, nl RVSF, aortic root 41mm, ascending aorta 30mm, valves unremarkable  Myoview 2019: Stress EF 41%, no ST segment deviation, findings c/w prior MI, low risk study   Neuro/Psych negative neurological ROS     GI/Hepatic Neg liver ROS, GERD  ,  Endo/Other  diabetes, Type 2, Oral Hypoglycemic Agents  Renal/GU negative Renal ROS  negative genitourinary   Musculoskeletal  (+) Arthritis , Osteoarthritis,    Abdominal   Peds  Hematology  (+) anemia , Plavix- last dose 05/03/21   Anesthesia Other Findings   Reproductive/Obstetrics                           Anesthesia Physical Anesthesia Plan  ASA: 3  Anesthesia Plan: Spinal   Post-op Pain Management: Regional block   Induction:   PONV Risk Score and Plan: 1 and Propofol infusion, Treatment may vary due to age or medical condition, Ondansetron, TIVA and Dexamethasone  Airway Management Planned: Nasal Cannula and Simple Face Mask  Additional Equipment: None  Intra-op Plan:   Post-operative Plan:   Informed Consent: I have reviewed the patients History and Physical, chart, labs and discussed the procedure including the risks, benefits and alternatives for the proposed anesthesia with the  patient or authorized representative who has indicated his/her understanding and acceptance.       Plan Discussed with:   Anesthesia Plan Comments: (See PAT note 05/01/2021, Konrad Felix Ward, PA-C)       Anesthesia Quick Evaluation

## 2021-05-10 NOTE — H&P (Signed)
TOTAL KNEE ADMISSION H&P  Patient is being admitted for right total knee arthroplasty.  Subjective:  Chief Complaint: Right knee pain.  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 Onset of symptoms was gradual, starting  several  years ago with gradually worsening course since that time. The patient noted no past surgery on the right knee.  Patient currently rates pain in the right knee at 7 out of 10 with activity. Patient has worsening of pain with activity and weight bearing, pain that interferes with activities of daily living, pain with passive range of motion, and crepitus. Patient has evidence of periarticular osteophytes and joint space narrowing by imaging studies. There is no active infection.  Patient Active Problem List   Diagnosis Date Noted   Colon cancer screening 10/31/2020   Gastroesophageal reflux disease 10/31/2020   Morbid obesity ([place]) 10/31/2020   Personal history of colonic polyps 10/31/2020   Pain of left hip joint 12/13/2019   AVNRT (AV nodal re-entry tachycardia) ([place])    Withdrawal arrhythmia (HCC)    Cubital tunnel syndrome, left 06/29/2016   CAD (coronary artery disease), native coronary artery 05/08/2014   Hyperlipidemia 03/21/2013    Class: Chronic   Old MI (myocardial infarction) 03/21/2013    Class: Chronic   DM (diabetes mellitus), type 2, uncontrolled 03/21/2013    Class: Chronic   Essential hypertension, benign 03/21/2013    Class: Chronic   Obesity (BMI 30-39.9) 03/21/2013   Chronic combined systolic and diastolic CHF (congestive heart failure) ([place])     Past Medical History:  Diagnosis Date   Chronic combined systolic and diastolic CHF (congestive heart failure) (HCC)    Coronary  atherosclerosis of native coronary artery    Dental crowns present    Essential hypertension, benign    states under control with meds., has been on med. x 15 yr.   GERD (gastroesophageal reflux disease)    Hepatitis    Drug induced hepatitis   MI (myocardial infarction) ([place]) 10/27/2002   Non-insulin dependent type 2 diabetes mellitus ([place])    Obesity    Osteoarthritis    hips and knees   Pure hypercholesterolemia    Sleep apnea     Past Surgical History:  Procedure Laterality Date   ANTERIOR INTEROSSEOUS NERVE DECOMPRESSION Right 04/15/2021   Procedure: ANTERIOR INTEROSSEOUS NERVE DECOMPRESSION;  Surgeon: [name], MD;  Location: [facility];  Service: Orthopedics;  Laterality: Right;  Only need 1 hour for case,  Axillary block/Mac   CARDIAC CATHETERIZATION  10/27/2002   CARPAL TUNNEL RELEASE Right 11/21/2013   CARPAL TUNNEL RELEASE Right 04/15/2021   Procedure: CARPAL TUNNEL RELEASE;  Surgeon: [name], MD;  Location: [facility] [place];  Service: Orthopedics;  Laterality: Right;   CORONARY STENT PLACEMENT  10/27/2002   mid LAD   TOTAL HIP ARTHROPLASTY Left 04/09/1999   TOTAL HIP ARTHROPLASTY Right    TOTAL KNEE ARTHROPLASTY Left 02/03/2011   ULNAR TUNNEL RELEASE Left 06/18/2016   Procedure: LEFT CUBITAL TUNNEL RELEASE;  Surgeon: [name], MD;  Location: [place];  Service: Orthopedics;  Laterality: Left;    Prior to Admission medications   Medication Sig Start Date End Date Taking? Authorizing Provider  amLODipine (NORVASC) 5 MG tablet TAKE 1 TABLET BY MOUTH ONCE  DAILY Patient taking differently: Take 5 mg by mouth daily. 03/30/21  Yes Belva Crome, MD  atorvastatin (LIPITOR) 40 MG tablet TAKE 1 TABLET BY MOUTH DAILY AT BEDTIME Patient taking differently: Take 40 mg by mouth daily. 12/25/20  Yes   carvedilol (COREG) 25 MG tablet TAKE 1 TABLET BY MOUTH TWO TIMES DAILY WITH A MEAL Patient taking differently: Take  25 mg by mouth 2 (two) times daily with a meal. 03/30/21  Yes Belva Crome, MD  clopidogrel (PLAVIX) 75 MG tablet Take 1 tablet by mouth once daily Patient taking differently: Take 75 mg by mouth daily. 12/25/20  Yes   dapagliflozin propanediol (FARXIGA) 10 MG TABS tablet Take 1 tablet by mouth once daily Patient taking differently: Take 10 mg by mouth daily. 12/25/20  Yes   Dulaglutide (TRULICITY) 3 KT/6.2BW SOPN Inject 3 mg Subcutaneously once a week 12/25/20  Yes   metFORMIN (GLUCOPHAGE-XR) 500 MG 24 hr tablet Take 2 tablets by mouth twice daily Patient taking differently: Take 1,000 mg by mouth 2 (two) times daily. 12/25/20  Yes   naproxen sodium (ALEVE) 220 MG tablet Take 220 mg by mouth as needed (pain).   Yes [provider]  naproxen sodium (ALEVE) 220 MG tablet Take 400 mg by mouth 2 (two) times daily as needed (pain).   Yes [provider]  oxyCODONE-acetaminophen (PERCOCET) 5-325 MG tablet Take 1 tablet by mouth every 4 hours as needed for severe pain. 04/15/21 04/15/22 Yes [name], MD  ramipril (ALTACE) 10 MG capsule Take 1 capsule (10 mg total) by mouth 2 (two) times daily. 03/30/21  Yes   triamterene-hydrochlorothiazide (DYAZIDE) 37.5-25 MG capsule TAKE 1 CAPSULE BY MOUTH ONCE A DAY IN THE MORNING 03/30/21  Yes Belva Crome, MD    No Known Allergies  Social History   Socioeconomic History   Marital status: Married    Spouse name: Not on file   Number of children: Not on file   Years of education: Not on file   Highest education level: Not on file  Occupational History   Not on file  Tobacco Use   Smoking status: Never   Smokeless tobacco: Never  Vaping Use   Vaping Use: Never used  Substance and Sexual Activity   Alcohol use: Yes    Alcohol/week: 0.0 standard drinks    Comment: occasionally   Drug use: No   Sexual activity: Not on file  Other Topics Concern   Not on file  Social History Narrative   Not on file   Social Determinants of  Health   Financial Resource Strain: Not on file  Food Insecurity: Not on file  Transportation Needs: Not on file  Physical Activity: Not on file  Stress: Not on file  Social Connections: Not on file  Intimate Partner Violence: Not on file    Tobacco Use: Low Risk    Smoking Tobacco Use: Never   Smokeless Tobacco Use: Never   Passive Exposure: Not on file   Social History   Substance and Sexual Activity  Alcohol Use Yes   Alcohol/week: 0.0 standard drinks   Comment: occasionally    Family History  Problem Relation Age of Onset   Healthy Mother    Healthy Father     ROS: Constitutional: no fever, no chills, no night sweats, no significant weight loss Cardiovascular: no chest pain, no palpitations Respiratory: no cough, no shortness of breath, No COPD Gastrointestinal: no vomiting, no nausea Musculoskeletal: no swelling in Joints, Joint  Pain Neurologic: no numbness, no tingling, no difficulty with balance   Objective:  Physical Exam: Well nourished and well developed.  General: Alert and oriented x3, cooperative and pleasant, no acute distress.  Head: normocephalic, atraumatic, neck supple.  Eyes: EOMI.  Respiratory: breath sounds clear in all fields, no wheezing, rales, or rhonchi. Cardiovascular: Regular rate and rhythm, no murmurs, gallops or rubs.  Abdomen: non-tender to palpation and soft, normoactive bowel sounds. Musculoskeletal:  Significantly antalgic gait pattern favoring the right side without using assisted devices.     Right Knee Exam:   Moderate effusion.   Slight valgus deformity.   Range of motion is 5-115 degrees.   Moderate crepitus on range of motion of the knee.   Slight medial joint line tenderness.   Positive lateral joint line tenderness.   Stable knee.   Calves soft and nontender. Motor function intact in LE. Strength 5/5 LE bilaterally. Neuro: Distal pulses 2+. Sensation to light touch intact in LE.     Vital signs in last 24  hours:    Imaging Review  Radiographs- AP and lateral of the right knee dated 11/2019 demonstrate tricompartmental bone-on-bone changes, worse in the lateral compartment.  Assessment/Plan:  End stage arthritis, right knee   The patient history, physical examination, clinical judgment of the provider and imaging studies are consistent with end stage degenerative joint disease of the right knee and total knee arthroplasty is deemed medically necessary. The treatment options including medical management, injection therapy arthroscopy and arthroplasty were discussed at length. The risks and benefits of total knee arthroplasty were presented and reviewed. The risks due to aseptic loosening, infection, stiffness, patella tracking problems, thromboembolic complications and other imponderables were discussed. The patient acknowledged the explanation, agreed to proceed with the plan and consent was signed. Patient is being admitted for inpatient treatment for surgery, pain control, PT, OT, prophylactic antibiotics, VTE prophylaxis, progressive ambulation and ADLs and discharge planning. The patient is planning to be discharged  home .   Patient's anticipated LOS is less than 2 midnights, meeting these requirements: - Younger than 70 - Lives within 1 hour of care - Has a competent adult at home to recover with post-op recover - NO history of  - Chronic pain requiring opiods  - Diabetes  - Coronary Artery Disease  - Heart failure  - Heart attack  - Stroke  - DVT/VTE  - Cardiac arrhythmia  - Respiratory Failure/COPD  - Renal failure  - Anemia  - Advanced Liver disease    Therapy Plans: [facility]  Disposition: Home with Wife Planned DVT Prophylaxis: Plavix DME Needed: RW PCP: Dr. Lutricia Feil Cardiologist: Dr. Daneen Schick (patient contacting for clearance) TXA: IV Allergies: None Anesthesia Concerns: None BMI: 37.3 Last HgbA1c: 7.2  Pharmacy: [facility]  - Patient was instructed on what medications to stop prior to surgery. - Follow-up visit in 2 weeks with Dr. Wynelle Link - Begin physical therapy following surgery - Pre-operative lab work as pre-surgical testing - Prescriptions will be provided in hospital at time of discharge  Fenton Foy, [facility], PA-C Orthopedic Surgery [facility] [place]

## 2021-05-11 ENCOUNTER — Observation Stay (HOSPITAL_COMMUNITY)
Admission: RE | Admit: 2021-05-11 | Discharge: 2021-05-12 | Disposition: A | Discharge status: Home / Self Care | Payer: 59 | Source: Ambulatory Visit | Attending: Orthopedic Surgery | Admitting: Orthopedic Surgery

## 2021-05-11 ENCOUNTER — Encounter (HOSPITAL_COMMUNITY): Payer: Self-pay | Admitting: Orthopedic Surgery

## 2021-05-11 ENCOUNTER — Encounter (HOSPITAL_COMMUNITY)
Admission: RE | Disposition: A | Discharge status: Home / Self Care | Payer: Self-pay | Source: Ambulatory Visit | Attending: Orthopedic Surgery

## 2021-05-11 ENCOUNTER — Ambulatory Visit (HOSPITAL_COMMUNITY): Payer: 59 | Admitting: Physician Assistant

## 2021-05-11 ENCOUNTER — Ambulatory Visit (HOSPITAL_COMMUNITY): Payer: 59 | Admitting: Anesthesiology

## 2021-05-11 DIAGNOSIS — I110 Hypertensive heart disease with heart failure: Secondary | ICD-10-CM | POA: Insufficient documentation

## 2021-05-11 DIAGNOSIS — M1711 Unilateral primary osteoarthritis, right knee: Principal | ICD-10-CM | POA: Diagnosis present

## 2021-05-11 DIAGNOSIS — D649 Anemia, unspecified: Secondary | ICD-10-CM | POA: Diagnosis not present

## 2021-05-11 DIAGNOSIS — Z96652 Presence of left artificial knee joint: Secondary | ICD-10-CM | POA: Insufficient documentation

## 2021-05-11 DIAGNOSIS — Z7984 Long term (current) use of oral hypoglycemic drugs: Secondary | ICD-10-CM | POA: Insufficient documentation

## 2021-05-11 DIAGNOSIS — Z7985 Long-term (current) use of injectable non-insulin antidiabetic drugs: Secondary | ICD-10-CM | POA: Diagnosis not present

## 2021-05-11 DIAGNOSIS — Z7902 Long term (current) use of antithrombotics/antiplatelets: Secondary | ICD-10-CM | POA: Diagnosis not present

## 2021-05-11 DIAGNOSIS — Z79899 Other long term (current) drug therapy: Secondary | ICD-10-CM | POA: Diagnosis not present

## 2021-05-11 DIAGNOSIS — M179 Osteoarthritis of knee, unspecified: Secondary | ICD-10-CM

## 2021-05-11 DIAGNOSIS — G8918 Other acute postprocedural pain: Secondary | ICD-10-CM | POA: Diagnosis not present

## 2021-05-11 DIAGNOSIS — E785 Hyperlipidemia, unspecified: Secondary | ICD-10-CM | POA: Diagnosis not present

## 2021-05-11 DIAGNOSIS — I5042 Chronic combined systolic (congestive) and diastolic (congestive) heart failure: Secondary | ICD-10-CM | POA: Diagnosis not present

## 2021-05-11 DIAGNOSIS — I2510 Atherosclerotic heart disease of native coronary artery without angina pectoris: Secondary | ICD-10-CM | POA: Insufficient documentation

## 2021-05-11 DIAGNOSIS — E119 Type 2 diabetes mellitus without complications: Secondary | ICD-10-CM | POA: Insufficient documentation

## 2021-05-11 DIAGNOSIS — Z96643 Presence of artificial hip joint, bilateral: Secondary | ICD-10-CM | POA: Insufficient documentation

## 2021-05-11 DIAGNOSIS — G4733 Obstructive sleep apnea (adult) (pediatric): Secondary | ICD-10-CM | POA: Diagnosis not present

## 2021-05-11 HISTORY — PX: TOTAL KNEE ARTHROPLASTY: SHX125

## 2021-05-11 LAB — GLUCOSE, CAPILLARY
Glucose-Capillary: 170 mg/dL — ABNORMAL HIGH (ref 70–99)
Glucose-Capillary: 155 mg/dL — ABNORMAL HIGH (ref 70–99)
Glucose-Capillary: 169 mg/dL — ABNORMAL HIGH (ref 70–99)
Glucose-Capillary: 318 mg/dL — ABNORMAL HIGH (ref 70–99)

## 2021-05-11 SURGERY — ARTHROPLASTY, KNEE, TOTAL
Anesthesia: General | Site: Knee | Laterality: Right

## 2021-05-11 MED ORDER — BUPIVACAINE LIPOSOME 1.3 % IJ SUSP: 20.0000 mL | Freq: Once | INTRAMUSCULAR | Status: DC

## 2021-05-11 MED ORDER — FENTANYL CITRATE (PF) 100 MCG/2ML IJ SOLN
INTRAMUSCULAR | Status: DC | PRN
  Administered 2021-05-11 (×4): 25 ug via INTRAVENOUS

## 2021-05-11 MED ORDER — ONDANSETRON HCL 4 MG/2ML IJ SOLN: 4.0000 mg | Freq: Once | INTRAMUSCULAR | Status: DC | PRN

## 2021-05-11 MED ORDER — DIPHENHYDRAMINE HCL 12.5 MG/5ML PO ELIX: 12.5000 mg | ORAL_SOLUTION | ORAL | Status: DC | PRN

## 2021-05-11 MED ORDER — POVIDONE-IODINE 10 % EX SWAB
2.0000 "application " | Freq: Once | CUTANEOUS | Status: AC
  Administered 2021-05-11: 2 via TOPICAL

## 2021-05-11 MED ORDER — BISACODYL 10 MG RE SUPP: 10.0000 mg | Freq: Every day | RECTAL | Status: DC | PRN

## 2021-05-11 MED ORDER — PROPOFOL 1000 MG/100ML IV EMUL
INTRAVENOUS | Status: AC
  Filled 2021-05-11: qty 100

## 2021-05-11 MED ORDER — 0.9 % SODIUM CHLORIDE (POUR BTL) OPTIME
TOPICAL | Status: DC | PRN
  Administered 2021-05-11: 14:00:00 1000 mL

## 2021-05-11 MED ORDER — BUPIVACAINE LIPOSOME 1.3 % IJ SUSP
INTRAMUSCULAR | Status: DC | PRN
  Administered 2021-05-11: 20 mL

## 2021-05-11 MED ORDER — ATORVASTATIN CALCIUM 40 MG PO TABS
40.0000 mg | ORAL_TABLET | Freq: Every day | ORAL | Status: DC
  Administered 2021-05-11: 20:00:00 40 mg via ORAL
  Filled 2021-05-11: qty 1

## 2021-05-11 MED ORDER — CEFAZOLIN SODIUM-DEXTROSE 2-4 GM/100ML-% IV SOLN
2.0000 g | INTRAVENOUS | Status: AC
  Administered 2021-05-11: 14:00:00 2 g via INTRAVENOUS
  Filled 2021-05-11: qty 100

## 2021-05-11 MED ORDER — PROPOFOL 500 MG/50ML IV EMUL
INTRAVENOUS | Status: AC
  Filled 2021-05-11: qty 50

## 2021-05-11 MED ORDER — ONDANSETRON HCL 4 MG PO TABS: 4.0000 mg | ORAL_TABLET | Freq: Four times a day (QID) | ORAL | Status: DC | PRN

## 2021-05-11 MED ORDER — ACETAMINOPHEN 10 MG/ML IV SOLN
1000.0000 mg | Freq: Four times a day (QID) | INTRAVENOUS | Status: DC
  Administered 2021-05-11: 14:00:00 1000 mg via INTRAVENOUS
  Filled 2021-05-11: qty 100

## 2021-05-11 MED ORDER — ACETAMINOPHEN 500 MG PO TABS
1000.0000 mg | ORAL_TABLET | Freq: Four times a day (QID) | ORAL | Status: DC
  Administered 2021-05-11 – 2021-05-12 (×3): 1000 mg via ORAL
  Filled 2021-05-11 (×4): qty 2

## 2021-05-11 MED ORDER — PROPOFOL 500 MG/50ML IV EMUL
INTRAVENOUS | Status: DC | PRN
  Administered 2021-05-11: 120 ug/kg/min via INTRAVENOUS

## 2021-05-11 MED ORDER — METHOCARBAMOL 500 MG PO TABS
500.0000 mg | ORAL_TABLET | Freq: Four times a day (QID) | ORAL | Status: DC | PRN
  Administered 2021-05-12 (×2): 500 mg via ORAL
  Filled 2021-05-11 (×3): qty 1

## 2021-05-11 MED ORDER — HYDROMORPHONE HCL 1 MG/ML IJ SOLN
0.2500 mg | INTRAMUSCULAR | Status: DC | PRN
  Administered 2021-05-11 (×2): 0.5 mg via INTRAVENOUS

## 2021-05-11 MED ORDER — OXYCODONE HCL 5 MG/5ML PO SOLN: 5.0000 mg | Freq: Once | ORAL | Status: DC | PRN

## 2021-05-11 MED ORDER — DAPAGLIFLOZIN PROPANEDIOL 10 MG PO TABS
10.0000 mg | ORAL_TABLET | Freq: Every day | ORAL | Status: DC
  Administered 2021-05-12: 10:00:00 10 mg via ORAL
  Filled 2021-05-11: qty 1

## 2021-05-11 MED ORDER — BUPIVACAINE LIPOSOME 1.3 % IJ SUSP
INTRAMUSCULAR | Status: AC
  Filled 2021-05-11: qty 20

## 2021-05-11 MED ORDER — PROPOFOL 10 MG/ML IV BOLUS
INTRAVENOUS | Status: DC | PRN
  Administered 2021-05-11: 30 mg via INTRAVENOUS
  Administered 2021-05-11: 200 mg via INTRAVENOUS

## 2021-05-11 MED ORDER — INSULIN ASPART 100 UNIT/ML IJ SOLN
0.0000 [IU] | Freq: Every day | INTRAMUSCULAR | Status: DC
  Administered 2021-05-11: 23:00:00 4 [IU] via SUBCUTANEOUS

## 2021-05-11 MED ORDER — CEFAZOLIN SODIUM-DEXTROSE 2-4 GM/100ML-% IV SOLN
2.0000 g | Freq: Four times a day (QID) | INTRAVENOUS | Status: AC
  Administered 2021-05-11 – 2021-05-12 (×2): 2 g via INTRAVENOUS
  Filled 2021-05-11 (×2): qty 100

## 2021-05-11 MED ORDER — LACTATED RINGERS IV SOLN: INTRAVENOUS | Status: DC

## 2021-05-11 MED ORDER — INSULIN ASPART 100 UNIT/ML IJ SOLN
0.0000 [IU] | Freq: Three times a day (TID) | INTRAMUSCULAR | Status: DC
  Administered 2021-05-11: 18:00:00 3 [IU] via SUBCUTANEOUS
  Administered 2021-05-12 (×2): 5 [IU] via SUBCUTANEOUS

## 2021-05-11 MED ORDER — PHENOL 1.4 % MT LIQD: 1.0000 | OROMUCOSAL | Status: DC | PRN

## 2021-05-11 MED ORDER — FLEET ENEMA 7-19 GM/118ML RE ENEM: 1.0000 | ENEMA | Freq: Once | RECTAL | Status: DC | PRN

## 2021-05-11 MED ORDER — HYDROMORPHONE HCL 1 MG/ML IJ SOLN
INTRAMUSCULAR | Status: AC
  Filled 2021-05-11: qty 2

## 2021-05-11 MED ORDER — TRAMADOL HCL 50 MG PO TABS
50.0000 mg | ORAL_TABLET | Freq: Four times a day (QID) | ORAL | Status: DC | PRN
  Administered 2021-05-12: 16:00:00 50 mg via ORAL
  Filled 2021-05-11: qty 1

## 2021-05-11 MED ORDER — SODIUM CHLORIDE (PF) 0.9 % IJ SOLN
INTRAMUSCULAR | Status: DC | PRN
  Administered 2021-05-11: 60 mL

## 2021-05-11 MED ORDER — ASPIRIN EC 325 MG PO TBEC
325.0000 mg | DELAYED_RELEASE_TABLET | Freq: Every day | ORAL | Status: DC
  Administered 2021-05-12: 08:00:00 325 mg via ORAL
  Filled 2021-05-11: qty 1

## 2021-05-11 MED ORDER — MORPHINE SULFATE (PF) 2 MG/ML IV SOLN: 0.5000 mg | INTRAVENOUS | Status: DC | PRN

## 2021-05-11 MED ORDER — TRANEXAMIC ACID-NACL 1000-0.7 MG/100ML-% IV SOLN
1000.0000 mg | INTRAVENOUS | Status: AC
  Administered 2021-05-11: 14:00:00 1000 mg via INTRAVENOUS
  Filled 2021-05-11: qty 100

## 2021-05-11 MED ORDER — ROPIVACAINE HCL 5 MG/ML IJ SOLN
INTRAMUSCULAR | Status: DC | PRN
  Administered 2021-05-11: 30 mL via PERINEURAL

## 2021-05-11 MED ORDER — PHENYLEPHRINE HCL-NACL 20-0.9 MG/250ML-% IV SOLN
INTRAVENOUS | Status: DC | PRN
  Administered 2021-05-11: 40 ug/min via INTRAVENOUS

## 2021-05-11 MED ORDER — CARVEDILOL 25 MG PO TABS
25.0000 mg | ORAL_TABLET | Freq: Two times a day (BID) | ORAL | Status: DC
  Administered 2021-05-11 – 2021-05-12 (×2): 25 mg via ORAL
  Filled 2021-05-11 (×2): qty 1

## 2021-05-11 MED ORDER — FENTANYL CITRATE PF 50 MCG/ML IJ SOSY
PREFILLED_SYRINGE | INTRAMUSCULAR | Status: AC
  Filled 2021-05-11: qty 2

## 2021-05-11 MED ORDER — TRIAMTERENE-HCTZ 37.5-25 MG PO CAPS
1.0000 | ORAL_CAPSULE | Freq: Every morning | ORAL | Status: DC
  Administered 2021-05-12: 10:00:00 1 via ORAL
  Filled 2021-05-11 (×2): qty 1

## 2021-05-11 MED ORDER — DEXAMETHASONE SODIUM PHOSPHATE 10 MG/ML IJ SOLN
8.0000 mg | Freq: Once | INTRAMUSCULAR | Status: AC
  Administered 2021-05-11: 14:00:00 8 mg via INTRAVENOUS

## 2021-05-11 MED ORDER — SODIUM CHLORIDE 0.9 % IR SOLN
Status: DC | PRN
  Administered 2021-05-11: 1000 mL

## 2021-05-11 MED ORDER — ONDANSETRON HCL 4 MG/2ML IJ SOLN
INTRAMUSCULAR | Status: DC | PRN
  Administered 2021-05-11: 4 mg via INTRAVENOUS

## 2021-05-11 MED ORDER — OXYCODONE HCL 5 MG PO TABS
10.0000 mg | ORAL_TABLET | ORAL | Status: DC | PRN
  Administered 2021-05-11: 17:00:00 15 mg via ORAL
  Administered 2021-05-12 (×2): 10 mg via ORAL
  Filled 2021-05-11: qty 2
  Filled 2021-05-11: qty 3

## 2021-05-11 MED ORDER — OXYCODONE HCL 5 MG PO TABS
5.0000 mg | ORAL_TABLET | ORAL | Status: DC | PRN
  Administered 2021-05-11 – 2021-05-12 (×2): 10 mg via ORAL
  Filled 2021-05-11 (×3): qty 2

## 2021-05-11 MED ORDER — STERILE WATER FOR IRRIGATION IR SOLN
Status: DC | PRN
  Administered 2021-05-11: 2000 mL

## 2021-05-11 MED ORDER — FENTANYL CITRATE PF 50 MCG/ML IJ SOSY
PREFILLED_SYRINGE | INTRAMUSCULAR | Status: AC
  Filled 2021-05-11: qty 1

## 2021-05-11 MED ORDER — METHOCARBAMOL 500 MG IVPB - SIMPLE MED
INTRAVENOUS | Status: AC
  Filled 2021-05-11: qty 50

## 2021-05-11 MED ORDER — FENTANYL CITRATE (PF) 100 MCG/2ML IJ SOLN
INTRAMUSCULAR | Status: AC
  Filled 2021-05-11: qty 2

## 2021-05-11 MED ORDER — ORAL CARE MOUTH RINSE: 15.0000 mL | Freq: Once | OROMUCOSAL | Status: AC

## 2021-05-11 MED ORDER — ONDANSETRON HCL 4 MG/2ML IJ SOLN: 4.0000 mg | Freq: Four times a day (QID) | INTRAMUSCULAR | Status: DC | PRN

## 2021-05-11 MED ORDER — POLYETHYLENE GLYCOL 3350 17 G PO PACK: 17.0000 g | PACK | Freq: Every day | ORAL | Status: DC | PRN

## 2021-05-11 MED ORDER — AMLODIPINE BESYLATE 5 MG PO TABS
5.0000 mg | ORAL_TABLET | Freq: Every day | ORAL | Status: DC
  Administered 2021-05-12: 10:00:00 5 mg via ORAL
  Filled 2021-05-11: qty 1

## 2021-05-11 MED ORDER — MIDAZOLAM HCL 2 MG/2ML IJ SOLN
1.0000 mg | INTRAMUSCULAR | Status: DC
  Administered 2021-05-11: 13:00:00 2 mg via INTRAVENOUS
  Filled 2021-05-11: qty 2

## 2021-05-11 MED ORDER — SODIUM CHLORIDE 0.9 % IV SOLN: INTRAVENOUS | Status: DC

## 2021-05-11 MED ORDER — METOCLOPRAMIDE HCL 5 MG PO TABS: 5.0000 mg | ORAL_TABLET | Freq: Three times a day (TID) | ORAL | Status: DC | PRN

## 2021-05-11 MED ORDER — FENTANYL CITRATE PF 50 MCG/ML IJ SOSY
25.0000 ug | PREFILLED_SYRINGE | INTRAMUSCULAR | Status: DC | PRN
  Administered 2021-05-11 (×3): 50 ug via INTRAVENOUS

## 2021-05-11 MED ORDER — MENTHOL 3 MG MT LOZG: 1.0000 | LOZENGE | OROMUCOSAL | Status: DC | PRN

## 2021-05-11 MED ORDER — METHOCARBAMOL 500 MG IVPB - SIMPLE MED
500.0000 mg | Freq: Four times a day (QID) | INTRAVENOUS | Status: DC | PRN
  Administered 2021-05-11: 16:00:00 500 mg via INTRAVENOUS
  Filled 2021-05-11: qty 50

## 2021-05-11 MED ORDER — GABAPENTIN 300 MG PO CAPS
300.0000 mg | ORAL_CAPSULE | Freq: Three times a day (TID) | ORAL | Status: DC
  Administered 2021-05-11 – 2021-05-12 (×4): 300 mg via ORAL
  Filled 2021-05-11 (×4): qty 1

## 2021-05-11 MED ORDER — METOCLOPRAMIDE HCL 5 MG/ML IJ SOLN: 5.0000 mg | Freq: Three times a day (TID) | INTRAMUSCULAR | Status: DC | PRN

## 2021-05-11 MED ORDER — AMISULPRIDE (ANTIEMETIC) 5 MG/2ML IV SOLN: 10.0000 mg | Freq: Once | INTRAVENOUS | Status: DC | PRN

## 2021-05-11 MED ORDER — SODIUM CHLORIDE (PF) 0.9 % IJ SOLN
INTRAMUSCULAR | Status: AC
  Filled 2021-05-11: qty 10

## 2021-05-11 MED ORDER — CLOPIDOGREL BISULFATE 75 MG PO TABS
75.0000 mg | ORAL_TABLET | Freq: Every day | ORAL | Status: DC
  Administered 2021-05-12: 10:00:00 75 mg via ORAL
  Filled 2021-05-11: qty 1

## 2021-05-11 MED ORDER — DOCUSATE SODIUM 100 MG PO CAPS
100.0000 mg | ORAL_CAPSULE | Freq: Two times a day (BID) | ORAL | Status: DC
  Administered 2021-05-11 – 2021-05-12 (×2): 100 mg via ORAL
  Filled 2021-05-11 (×2): qty 1

## 2021-05-11 MED ORDER — CHLORHEXIDINE GLUCONATE 0.12 % MT SOLN
15.0000 mL | Freq: Once | OROMUCOSAL | Status: AC
  Administered 2021-05-11: 11:00:00 15 mL via OROMUCOSAL

## 2021-05-11 MED ORDER — OXYCODONE HCL 5 MG PO TABS: 5.0000 mg | ORAL_TABLET | Freq: Once | ORAL | Status: DC | PRN

## 2021-05-11 MED ORDER — FENTANYL CITRATE PF 50 MCG/ML IJ SOSY
50.0000 ug | PREFILLED_SYRINGE | INTRAMUSCULAR | Status: DC
  Administered 2021-05-11: 13:00:00 50 ug via INTRAVENOUS
  Filled 2021-05-11: qty 2

## 2021-05-11 SURGICAL SUPPLY — 60 items
ATTUNE MED DOME PAT 41 KNEE (Knees) ×1 IMPLANT
ATTUNE MED DOME PAT 41MM KNEE (Knees) ×1 IMPLANT
ATTUNE PS FEM RT SZ 7 CEM KNEE (Femur) ×2 IMPLANT
ATTUNE PSRP INSR SZ7 12 KNEE (Insert) ×1 IMPLANT
ATTUNE PSRP INSR SZ7 12MM KNEE (Insert) ×1 IMPLANT
BAG COUNTER SPONGE SURGICOUNT (BAG) IMPLANT
BAG SPEC THK2 15X12 ZIP CLS (MISCELLANEOUS) ×1
BAG SPNG CNTER NS LX DISP (BAG)
BAG SURGICOUNT SPONGE COUNTING (BAG)
BAG ZIPLOCK 12X15 (MISCELLANEOUS) ×3 IMPLANT
BASE TIBIAL ROT PLAT SZ 7 KNEE (Knees) IMPLANT
BLADE SAG 18X100X1.27 (BLADE) ×3 IMPLANT
BLADE SAW SGTL 11.0X1.19X90.0M (BLADE) ×3 IMPLANT
BNDG ELASTIC 6X5.8 VLCR STR LF (GAUZE/BANDAGES/DRESSINGS) ×3 IMPLANT
BOWL SMART MIX CTS (DISPOSABLE) ×3 IMPLANT
BSPLAT TIB 7 CMNT ROT PLAT STR (Knees) ×1 IMPLANT
CEMENT HV SMART SET (Cement) ×6 IMPLANT
CLOSURE STERI-STRIP 1/2X4 (GAUZE/BANDAGES/DRESSINGS) ×1
CLOSURE WOUND 1/2 X4 (GAUZE/BANDAGES/DRESSINGS) ×2
CLSR STERI-STRIP ANTIMIC 1/2X4 (GAUZE/BANDAGES/DRESSINGS) ×1 IMPLANT
COVER SURGICAL LIGHT HANDLE (MISCELLANEOUS) ×3 IMPLANT
CUFF TOURN SGL QUICK 34 (TOURNIQUET CUFF) ×3
CUFF TRNQT CYL 34X4.125X (TOURNIQUET CUFF) ×1 IMPLANT
DECANTER SPIKE VIAL GLASS SM (MISCELLANEOUS) ×3 IMPLANT
DRAPE INCISE IOBAN 66X45 STRL (DRAPES) ×3 IMPLANT
DRAPE U-SHAPE 47X51 STRL (DRAPES) ×3 IMPLANT
DRSG AQUACEL AG ADV 3.5X10 (GAUZE/BANDAGES/DRESSINGS) ×3 IMPLANT
DURAPREP 26ML APPLICATOR (WOUND CARE) ×3 IMPLANT
ELECT REM PT RETURN 15FT ADLT (MISCELLANEOUS) ×3 IMPLANT
GLOVE SRG 8 PF TXTR STRL LF DI (GLOVE) ×1 IMPLANT
GLOVE SURG ENC MOIS LTX SZ6.5 (GLOVE) ×3 IMPLANT
GLOVE SURG ENC MOIS LTX SZ8 (GLOVE) ×6 IMPLANT
GLOVE SURG UNDER POLY LF SZ7 (GLOVE) ×3 IMPLANT
GLOVE SURG UNDER POLY LF SZ8 (GLOVE) ×3
GLOVE SURG UNDER POLY LF SZ8.5 (GLOVE) ×3 IMPLANT
GOWN STRL REUS W/TWL LRG LVL3 (GOWN DISPOSABLE) ×6 IMPLANT
GOWN STRL REUS W/TWL XL LVL3 (GOWN DISPOSABLE) ×3 IMPLANT
HANDPIECE INTERPULSE COAX TIP (DISPOSABLE) ×3
HOLDER FOLEY CATH W/STRAP (MISCELLANEOUS) IMPLANT
IMMOBILIZER KNEE 20 (SOFTGOODS) ×3
IMMOBILIZER KNEE 20 THIGH 36 (SOFTGOODS) ×1 IMPLANT
KIT TURNOVER KIT A (KITS) IMPLANT
MANIFOLD NEPTUNE II (INSTRUMENTS) ×3 IMPLANT
NS IRRIG 1000ML POUR BTL (IV SOLUTION) ×3 IMPLANT
PACK TOTAL KNEE CUSTOM (KITS) ×3 IMPLANT
PADDING CAST COTTON 6X4 STRL (CAST SUPPLIES) ×4 IMPLANT
PROTECTOR NERVE ULNAR (MISCELLANEOUS) ×3 IMPLANT
SET HNDPC FAN SPRY TIP SCT (DISPOSABLE) ×1 IMPLANT
STRIP CLOSURE SKIN 1/2X4 (GAUZE/BANDAGES/DRESSINGS) ×4 IMPLANT
SUT ETHIBOND NAB CT1 #1 30IN (SUTURE) ×4 IMPLANT
SUT MNCRL AB 4-0 PS2 18 (SUTURE) ×3 IMPLANT
SUT STRATAFIX 0 PDS 27 VIOLET (SUTURE) ×3
SUT VIC AB 2-0 CT1 27 (SUTURE) ×9
SUT VIC AB 2-0 CT1 TAPERPNT 27 (SUTURE) ×3 IMPLANT
SUTURE STRATFX 0 PDS 27 VIOLET (SUTURE) ×1 IMPLANT
TIBIAL BASE ROT PLAT SZ 7 KNEE (Knees) ×3 IMPLANT
TRAY FOLEY MTR SLVR 16FR STAT (SET/KITS/TRAYS/PACK) ×3 IMPLANT
TUBE SUCTION HIGH CAP CLEAR NV (SUCTIONS) ×3 IMPLANT
WATER STERILE IRR 1000ML POUR (IV SOLUTION) ×6 IMPLANT
WRAP KNEE MAXI GEL POST OP (GAUZE/BANDAGES/DRESSINGS) ×3 IMPLANT

## 2021-05-11 NOTE — Plan of Care (Signed)

## 2021-05-11 NOTE — Discharge Instructions (Signed)
Ryan Arabian, MD Total [facility] Specialist [facility] [address], [address] [place], [place] [address] [phone]  TOTAL KNEE REPLACEMENT POSTOPERATIVE DIRECTIONS    Knee Rehabilitation, Guidelines Following Surgery  Results after knee surgery are often greatly improved when you follow the exercise, range of motion and muscle strengthening exercises prescribed by your doctor. Safety measures are also important to protect the knee from further injury. If any of these exercises cause you to have increased pain or swelling in your knee joint, decrease the amount until you are comfortable again and slowly increase them. If you have problems or questions, call your caregiver or physical therapist for advice.   BLOOD CLOT PREVENTION Take a 325 mg Aspirin once a day for three weeks following surgery with your usual Plavix dose. Then take an 81 mg Aspirin once a day for three weeks with your usual Plavix dose.. Then discontinue Aspirin. You may resume your vitamins/supplements upon discharge from the hospital. Do not take any NSAIDs (Advil, Aleve, Ibuprofen, Meloxicam, etc.) until you have discontinued the 325 mg Aspirin.   HOME CARE INSTRUCTIONS  Remove items at home which could result in a fall. This includes throw rugs or furniture in walking pathways.  ICE to the affected knee as much as tolerated. Icing helps control swelling. If the swelling is well controlled you will be more comfortable and rehab easier. Continue to use ice on the knee for pain and swelling from surgery. You may notice swelling that will progress down to the foot and ankle. This is normal after surgery. Elevate the leg when you are not up walking on it.    Continue to use the breathing machine which will help keep your temperature down. It is common for your temperature to cycle up and down following surgery, especially at night when you are not up moving around and exerting yourself. The breathing  machine keeps your lungs expanded and your temperature down. Do not place pillow under the operative knee, focus on keeping the knee straight while resting  DIET You may resume your previous home diet once you are discharged from the hospital.  DRESSING / WOUND CARE / SHOWERING Keep your bulky bandage on for 2 days. On the third post-operative day you may remove the Ace bandage and gauze. There is a waterproof adhesive bandage on your skin which will stay in place until your first follow-up appointment. Once you remove this you will not need to place another bandage You may begin showering 3 days following surgery, but do not submerge the incision under water.  ACTIVITY For the first 5 days, the key is rest and control of pain and swelling Do your home exercises twice a day starting on post-operative day 3. On the days you go to physical therapy, just do the home exercises once that day. You should rest, ice and elevate the leg for 50 minutes out of every hour. Get up and walk/stretch for 10 minutes per hour. After 5 days you can increase your activity slowly as tolerated. Walk with your walker as instructed. Use the walker until you are comfortable transitioning to a cane. Walk with the cane in the opposite hand of the operative leg. You may discontinue the cane once you are comfortable and walking steadily. Avoid periods of inactivity such as sitting longer than an hour when not asleep. This helps prevent blood clots.  You may discontinue the knee immobilizer once you are able to perform a straight leg raise while lying  down. You may resume a sexual relationship in one month or when given the OK by your doctor.  You may return to work once you are cleared by your doctor.  Do not drive a car for 6 weeks or until released by your surgeon.  Do not drive while taking narcotics.  TED HOSE STOCKINGS Wear the elastic stockings on both legs for three weeks following surgery during the day. You may  remove them at night for sleeping.  WEIGHT BEARING Weight bearing as tolerated with assist device (walker, cane, etc) as directed, use it as long as suggested by your surgeon or therapist, typically at least 4-6 weeks.  POSTOPERATIVE CONSTIPATION PROTOCOL Constipation - defined medically as fewer than three stools per week and severe constipation as less than one stool per week.  One of the most common issues patients have following surgery is constipation.  Even if you have a regular bowel pattern at home, your normal regimen is likely to be disrupted due to multiple reasons following surgery.  Combination of anesthesia, postoperative narcotics, change in appetite and fluid intake all can affect your bowels.  In order to avoid complications following surgery, here are some recommendations in order to help you during your recovery period.  Colace (docusate) - Pick up an over-the-counter form of Colace or another stool softener and take twice a day as long as you are requiring postoperative pain medications.  Take with a full glass of water daily.  If you experience loose stools or diarrhea, hold the colace until you stool forms back up. If your symptoms do not get better within 1 week or if they get worse, check with your doctor. Dulcolax (bisacodyl) - Pick up over-the-counter and take as directed by the product packaging as needed to assist with the movement of your bowels.  Take with a full glass of water.  Use this product as needed if not relieved by Colace only.  MiraLax (polyethylene glycol) - Pick up over-the-counter to have on hand. MiraLax is a solution that will increase the amount of water in your bowels to assist with bowel movements.  Take as directed and can mix with a glass of water, juice, soda, coffee, or tea. Take if you go more than two days without a movement. Do not use MiraLax more than once per day. Call your doctor if you are still constipated or irregular after using this  medication for 7 days in a row.  If you continue to have problems with postoperative constipation, please contact the office for further assistance and recommendations.  If you experience "the worst abdominal pain ever" or develop nausea or vomiting, please contact the office immediatly for further recommendations for treatment.  ITCHING If you experience itching with your medications, try taking only a single pain pill, or even half a pain pill at a time.  You can also use Benadryl over the counter for itching or also to help with sleep.   MEDICATIONS See your medication summary on the After Visit Summary that the nursing staff will review with you prior to discharge.  You may have some home medications which will be placed on hold until you complete the course of blood thinner medication.  It is important for you to complete the blood thinner medication as prescribed by your surgeon.  Continue your approved medications as instructed at time of discharge.  PRECAUTIONS If you experience chest pain or shortness of breath - call 911 immediately for transfer to the hospital emergency department.  If you develop a fever greater that 101 F, purulent drainage from wound, increased redness or drainage from wound, foul odor from the wound/dressing, or calf pain - CONTACT YOUR SURGEON.                                                   FOLLOW-UP APPOINTMENTS Make sure you keep all of your appointments after your operation with your surgeon and caregivers. You should call the office at the above phone number and make an appointment for approximately two weeks after the date of your surgery or on the date instructed by your surgeon outlined in the "After Visit Summary".  RANGE OF MOTION AND STRENGTHENING EXERCISES  Rehabilitation of the knee is important following a knee injury or an operation. After just a few days of immobilization, the muscles of the thigh which control the knee become weakened and shrink  (atrophy). Knee exercises are designed to build up the tone and strength of the thigh muscles and to improve knee motion. Often times heat used for twenty to thirty minutes before working out will loosen up your tissues and help with improving the range of motion but do not use heat for the first two weeks following surgery. These exercises can be done on a training (exercise) mat, on the floor, on a table or on a bed. Use what ever works the best and is most comfortable for you Knee exercises include:  Leg Lifts - While your knee is still immobilized in a splint or cast, you can do straight leg raises. Lift the leg to 60 degrees, hold for 3 sec, and slowly lower the leg. Repeat 10-20 times 2-3 times daily. Perform this exercise against resistance later as your knee gets better.  Quad and Hamstring Sets - Tighten up the muscle on the front of the thigh (Quad) and hold for 5-10 sec. Repeat this 10-20 times hourly. Hamstring sets are done by pushing the foot backward against an object and holding for 5-10 sec. Repeat as with quad sets.  Leg Slides: Lying on your back, slowly slide your foot toward your buttocks, bending your knee up off the floor (only go as far as is comfortable). Then slowly slide your foot back down until your leg is flat on the floor again. Angel Wings: Lying on your back spread your legs to the side as far apart as you can without causing discomfort.  A rehabilitation program following serious knee injuries can speed recovery and prevent re-injury in the future due to weakened muscles. Contact your doctor or a physical therapist for more information on knee rehabilitation.   POST-OPERATIVE OPIOID TAPER INSTRUCTIONS: It is important to wean off of your opioid medication as soon as possible. If you do not need pain medication after your surgery it is ok to stop day one. Opioids include: Codeine, Hydrocodone(Norco, Vicodin), Oxycodone(Percocet, oxycontin) and hydromorphone amongst others.   Long term and even short term use of opiods can cause: Increased pain response Dependence Constipation Depression Respiratory depression And more.  Withdrawal symptoms can include Flu like symptoms Nausea, vomiting And more Techniques to manage these symptoms Hydrate well Eat regular healthy meals Stay active Use relaxation techniques(deep breathing, meditating, yoga) Do Not substitute Alcohol to help with tapering If you have been on opioids for less than two weeks and do not have pain than  it is ok to stop all together.  Plan to wean off of opioids This plan should start within one week post op of your joint replacement. Maintain the same interval or time between taking each dose and first decrease the dose.  Cut the total daily intake of opioids by one tablet each day Next start to increase the time between doses. The last dose that should be eliminated is the evening dose.   IF YOU ARE TRANSFERRED TO A SKILLED REHAB FACILITY If the patient is transferred to a skilled rehab facility following release from the hospital, a list of the current medications will be sent to the facility for the patient to continue.  When discharged from the skilled rehab facility, please have the facility set up the patient's [place] prior to being released. Also, the skilled facility will be responsible for providing the patient with their medications at time of release from the facility to include their pain medication, the muscle relaxants, and their blood thinner medication. If the patient is still at the rehab facility at time of the two week follow up appointment, the skilled rehab facility will also need to assist the patient in arranging follow up appointment in our office and any transportation needs.  MAKE SURE YOU:  Understand these instructions.  Get help right away if you are not doing well or get worse.   DENTAL ANTIBIOTICS:  In most cases prophylactic antibiotics  for Dental procdeures after total joint surgery are not necessary.  Exceptions are as follows:  1. History of prior total joint infection  2. Severely immunocompromised (Organ Transplant, cancer chemotherapy, Rheumatoid biologic meds such as [place])  3. Poorly controlled diabetes (A1C &gt; 8.0, blood glucose over 200)  If you have one of these conditions, contact your surgeon for an antibiotic prescription, prior to your dental procedure.    Pick up stool softner and laxative for home use following surgery while on pain medications. Do not submerge incision under water. Please use good hand washing techniques while changing dressing each day. May shower starting three days after surgery. Please use a clean towel to pat the incision dry following showers. Continue to use ice for pain and swelling after surgery. Do not use any lotions or creams on the incision until instructed by your surgeon.

## 2021-05-11 NOTE — Op Note (Signed)
OPERATIVE REPORT-TOTAL KNEE ARTHROPLASTY   Pre-operative diagnosis- Osteoarthritis  Right knee(s)  Post-operative diagnosis- Osteoarthritis Right knee(s)  Procedure-  Right  Total Knee Arthroplasty  Surgeon- Dione Plover. Gabriela Giannelli, MD  Assistant- Theresa Duty, PA-C   Anesthesia-  GA combined with regional for post-op pain  EBL- 25 ml   Drains None  Tourniquet time-  Total Tourniquet Time Documented: Thigh (Right) - 46 minutes Total: Thigh (Right) - 46 minutes     Complications- None  Condition-PACU - hemodynamically stable.   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 He has constant pain, with activity and at rest and significant functional deficits with difficulties even with ADLs. He has had extensive non-op management including analgesics, injections of cortisone and viscosupplements, and home exercise program, but remains in significant pain with significant dysfunction. Radiographs show bone on bone arthritis lateral and patellofemoral. He presents now for right Total Knee Arthroplasty.     Procedure in detail---   The patient is brought into the operating room and positioned supine on the operating table. After successful administration of  GA combined with regional for post-op pain,   a tourniquet is placed high on the  Right thigh(s) and the lower extremity is prepped and draped in the usual sterile fashion. Time out is performed by the operating team and then the  Right lower extremity is wrapped in Esm[place], knee flexed and the tourniquet inflated to 300 mmHg.       A midline incision is made with a ten blade through the subcutaneous tissue to the level of the extensor mechanism. A fresh blade is used to make a medial parapatellar arthrotomy. Soft tissue over the proximal medial tibia is subperiosteally elevated to the joint line with a knife and into the  semimembranosus bursa with a Cobb elevator. Soft tissue over the proximal lateral tibia is elevated with attention being paid to avoiding the patellar tendon on the tibial tubercle. The patella is everted, knee flexed 90 degrees and the ACL and PCL are removed. Findings are bone on bone lateral and patellofemoral with massive global osteophytes.        The drill is used to create a starting hole in the distal femur and the canal is thoroughly irrigated with sterile saline to remove the fatty contents. The 5 degree Right  valgus alignment guide is placed into the femoral canal and the distal femoral cutting block is pinned to remove 9 mm off the distal femur. Resection is made with an oscillating saw.      The tibia is subluxed forward and the menisci are removed. The extramedullary alignment guide is placed referencing proximally at the medial aspect of the tibial tubercle and distally along the second metatarsal axis and tibial crest. The block is pinned to remove 62mm off the more deficient lateral  side. Resection is made with an oscillating saw. Size 7is the most appropriate size for the tibia and the proximal tibia is prepared with the modular drill and keel punch for that size.      The femoral sizing guide is placed and size 7 is most appropriate. Rotation is marked off the epicondylar axis and confirmed by creating a rectangular flexion gap at 90 degrees. The size 7 cutting block is pinned in this rotation and the anterior, posterior and chamfer cuts are made with the oscillating saw. The intercondylar block is then  placed and that cut is made.      Trial size 7 tibial component, trial size 7 posterior stabilized femur and a 12  mm posterior stabilized rotating platform insert trial is placed. Full extension is achieved with excellent varus/valgus and anterior/posterior balance throughout full range of motion. The patella is everted and thickness measured to be 27  mm. Free hand resection is taken to 15  mm, a 41 template is placed, lug holes are drilled, trial patella is placed, and it tracks normally. Osteophytes are removed off the posterior femur with the trial in place. All trials are removed and the cut bone surfaces prepared with pulsatile lavage. Cement is mixed and once ready for implantation, the size 7 tibial implant, size  7 posterior stabilized femoral component, and the size 41 patella are cemented in place and the patella is held with the clamp. The trial insert is placed and the knee held in full extension. The Exparel (20 ml mixed with 60 ml saline) is injected into the extensor mechanism, posterior capsule, medial and lateral gutters and subcutaneous tissues.  All extruded cement is removed and once the cement is hard the permanent 12 mm posterior stabilized rotating platform insert is placed into the tibial tray.      The wound is copiously irrigated with saline solution and the extensor mechanism closed with # 0 Stratofix suture. The tourniquet is released for a total tourniquet time of 46  minutes. Flexion against gravity is 140 degrees and the patella tracks normally. Subcutaneous tissue is closed with 2.0 vicryl and subcuticular with running 4.0 Monocryl. The incision is cleaned and dried and steri-strips and a bulky sterile dressing are applied. The limb is placed into a knee immobilizer and the patient is awakened and transported to recovery in stable condition.      Please note that a surgical assistant was a medical necessity for this procedure in order to perform it in a safe and expeditious manner. Surgical assistant was necessary to retract the ligaments and vital neurovascular structures to prevent injury to them and also necessary for proper positioning of the limb to allow for anatomic placement of the prosthesis.   Dione Plover Susana Duell, MD    05/11/2021, 2:57 PM

## 2021-05-11 NOTE — Interval H&P Note (Signed)
History and Physical Interval Note:  05/11/2021 10:44 AM  [name], MD  has presented today for surgery, with the diagnosis of right knee osteoarthritis.  The various methods of treatment have been discussed with the patient and family. After consideration of risks, benefits and other options for treatment, the patient has consented to  Procedure(s): TOTAL KNEE ARTHROPLASTY (Right) as a surgical intervention.  The patient's history has been reviewed, patient examined, no change in status, stable for surgery.  I have reviewed the patient's chart and labs.  Questions were answered to the patient's satisfaction.     Pilar Plate Kayra Crowell

## 2021-05-11 NOTE — Evaluation (Signed)
Physical Therapy Evaluation Patient Details Name: Ryan FRASIER, MD MRN: [id number] DOB: 04/08/1951 Today's Date: 05/11/2021  History of Present Illness  Patient is 70 y.o. male s/p Rt TKA on 05/11/21 with PMH significant for OA, GERD, HTN, MI, obesity, DMII, Bil THA, Lt TKA in 2012.    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 Patient reports independence with SCP for mobility at baseline. Patient is now limited by functional impairments (see PT problem list below) and requires MOD Assist for transfers with RW. Patient was limited by weakness and lethargy to bed>chair transfer today with MOD Assist to mange walker and cues for sequencing. Patient will benefit from continued skilled PT interventions to address impairments and progress towards PLOF. Acute PT will follow to progress mobility and stair training in preparation for safe discharge home.        Recommendations for follow up therapy are one component of a multi-disciplinary discharge planning process, led by the attending physician.  Recommendations may be updated based on patient status, additional functional criteria and insurance authorization.  Follow Up Recommendations Home health PT (HHPT vs OPPT if pt does not progress mobility)    Assistance Recommended at Discharge Frequent or constant Supervision/Assistance  Functional Status Assessment Patient has had a recent decline in their functional status and demonstrates the ability to make significant improvements in function in a reasonable and predictable amount of time.  Equipment Recommendations  None recommended by PT    Recommendations for Other Services       Precautions / Restrictions Precautions Precautions: Fall Restrictions Weight Bearing Restrictions: No RLE Weight Bearing: Weight bearing as tolerated      Mobility  Bed Mobility Overal bed mobility: Needs Assistance Bed Mobility: Supine to Sit     Supine to sit: Min  assist;Mod assist;HOB elevated     General bed mobility comments: Cues for use of bed rail, min-mod assist to pivot to EOB and assist to bring LE's fully off edge. use of bed pad needed to scoot.    Transfers Overall transfer level: Needs assistance Equipment used: Rolling walker (2 wheels) Transfers: Sit to/from Stand;Bed to chair/wheelchair/BSC Sit to Stand: Mod assist;From elevated surface   Step pivot transfers: Mod assist;From elevated surface       General transfer comment: Repeated cues needed for hand placement/technique with RW, Pt required EOB elevated. Mod assist for power up and to steady. Pt with flexed posture in standing and leaning anteriorly on walker reaching for cross bar intermittently. Mod Assist to manage walker position and cues for step sequencing. pt hesitant to step Lt foot and shuffled/scooted it to avoid full weight bearing on Rt knee.    Ambulation/Gait                  Stairs            Wheelchair Mobility    Modified Rankin (Stroke Patients Only)       Balance Overall balance assessment: Needs assistance Sitting-balance support: Feet supported Sitting balance-Leahy Scale: Fair     Standing balance support: Reliant on assistive device for balance;Bilateral upper extremity supported;During functional activity Standing balance-Leahy Scale: Poor                               Pertinent Vitals/Pain Pain Assessment: 0-10 Pain Score: 2  Pain Location: Rt knee Pain Descriptors / Indicators: Aching;Discomfort Pain Intervention(s):  Limited activity within patient's tolerance;Monitored during session;Repositioned;Premedicated before session;Ice applied    Home Living Family/patient expects to be discharged to:: Private residence Living Arrangements: Spouse/significant other Available Help at Discharge: Family Type of Home: House Home Access: Stairs to enter Entrance Stairs-Rails: Psychiatric nurse of  Steps: 3   Home Layout: Two level;Able to live on main level with bedroom/bathroom;Full bath on main level Home Equipment: Shower seat;Grab bars - tub/shower;Toilet riser;Rolling Walker (2 wheels);Cane - single point;Rollator (4 wheels) Additional Comments: pt has help from wife and daughter    Prior Function Prior Level of Function : Independent/Modified Independent;Working/employed             Mobility Comments: pt has been reliant on [facility] for mobility, he is a physician, OBG       Hand Dominance   Dominant Hand: Right    Extremity/Trunk Assessment   Upper Extremity Assessment Upper Extremity Assessment: Overall WFL for tasks assessed    Lower Extremity Assessment Lower Extremity Assessment: RLE deficits/detail RLE Deficits / Details: good quad acitvation, limited SLR elevated due to general weakness, no extensor lag RLE Sensation: WNL RLE Coordination: WNL    Cervical / Trunk Assessment Cervical / Trunk Assessment: Normal  Communication   Communication: No difficulties  Cognition Arousal/Alertness: Lethargic;Suspect due to medications (GA and pain medication) Behavior During Therapy: WFL for tasks assessed/performed Overall Cognitive Status: Within Functional Limits for tasks assessed                                          General Comments      Exercises Total Joint Exercises Ankle Circles/Pumps:  (attempted to educate on but pt too lethargic to complete)   Assessment/Plan    PT Assessment Patient needs continued PT services  PT Problem List Decreased strength;Decreased range of motion;Decreased activity tolerance;Decreased balance;Decreased mobility;Decreased knowledge of use of DME;Decreased knowledge of precautions;Obesity;Pain       PT Treatment Interventions DME instruction;Gait training;Stair training;Functional mobility training;Therapeutic exercise;Therapeutic activities;Balance training;Patient/family education    PT Goals  (Current goals can be found in the Care Plan section)  Acute Rehab PT Goals Patient Stated Goal: recover independence with mobility and keep moving PT Goal Formulation: With patient/family Time For Goal Achievement: 05/18/21 Potential to Achieve Goals: Good    Frequency 7X/week   Barriers to discharge        Co-evaluation               AM-PAC PT "6 Clicks" Mobility  Outcome Measure Help needed turning from your back to your side while in a flat bed without using bedrails?: A Little Help needed moving from lying on your back to sitting on the side of a flat bed without using bedrails?: A Lot Help needed moving to and from a bed to a chair (including a wheelchair)?: A Lot Help needed standing up from a chair using your arms (e.g., wheelchair or bedside chair)?: A Lot Help needed to walk in hospital room?: Total Help needed climbing 3-5 steps with a railing? : Total 6 Click Score: 11    End of Session Equipment Utilized During Treatment: Gait belt Activity Tolerance: Patient tolerated treatment well;Patient limited by lethargy Patient left: in chair;with call bell/phone within reach;with chair alarm set;with family/visitor present Nurse Communication: Mobility status PT Visit Diagnosis: Muscle weakness (generalized) (M62.81);Difficulty in walking, not elsewhere classified (R26.2)    Time: 2951-8841 PT Time  Calculation (min) (ACUTE ONLY): 34 min   Charges:   PT Evaluation $PT Eval Low Complexity: 1 Low PT Treatments $Therapeutic Activity: 8-22 mins        Verner Mould, DPT Acute Rehabilitation Services Office [phone] Pager [phone]   Jacques Navy 05/11/2021, 6:52 PM

## 2021-05-11 NOTE — Anesthesia Procedure Notes (Signed)
Procedure Name: LMA Insertion Date/Time: 05/11/2021 2:45 PM Performed by: Milford Cage, CRNA Pre-anesthesia Checklist: Patient identified, Emergency Drugs available, Suction available and Patient being monitored Patient Re-evaluated:Patient Re-evaluated prior to induction Oxygen Delivery Method: Circle system utilized Preoxygenation: Pre-oxygenation with 100% oxygen Induction Type: IV induction Ventilation: Mask ventilation without difficulty LMA: LMA inserted LMA Size: 4.0 Number of attempts: 1 Tube secured with: Tape Dental Injury: Teeth and Oropharynx as per pre-operative assessment

## 2021-05-11 NOTE — Transfer of Care (Signed)
Immediate Anesthesia Transfer of Care Note  Patient: Ryan Bombard, MD  Procedure(s) Performed: TOTAL KNEE ARTHROPLASTY (Right: Knee)  Patient Location: PACU  Anesthesia Type:General  Level of Consciousness: awake  Airway & Oxygen Therapy: Patient Spontanous Breathing and Patient connected to nasal cannula oxygen  Post-op Assessment: Report given to RN and Post -op Vital signs reviewed and stable  Post vital signs: Reviewed and stable  Last Vitals:  Vitals Value Taken Time  BP 123/81 05/11/21 1530  Temp    Pulse 81 05/11/21 1531  Resp 15 05/11/21 1530  SpO2 99 % 05/11/21 1531  Vitals shown include unvalidated device data.  Last Pain:  Vitals:   05/11/21 1021  TempSrc:   PainSc: 2       Patients Stated Pain Goal: 3 (81/19/14 7829)  Complications: No notable events documented.

## 2021-05-11 NOTE — Progress Notes (Signed)
AssistedDr. Carolyn Witman with right, ultrasound guided, adductor canal block. Side rails up, monitors on throughout procedure. See vital signs in flow sheet. Tolerated Procedure well.  

## 2021-05-11 NOTE — Progress Notes (Signed)
Orthopedic Tech Progress Note Patient Details:  Ryan TIPPIN, MD 08-16-1950 [id number]  CPM Right Knee Right Knee Flexion (Degrees): 10 Right Knee Extension (Degrees): 40  Post Interventions Patient Tolerated: Well  Aison Malveaux A Alaria Oconnor 05/11/2021, 4:29 PM

## 2021-05-11 NOTE — Anesthesia Procedure Notes (Signed)
Spinal  Patient location during procedure: OR Reason for block: surgical anesthesia Staffing Performed: anesthesiologist  Anesthesiologist: Lidia Collum, MD Preanesthetic Checklist Completed: patient identified, IV checked, risks and benefits discussed, surgical consent, monitors and equipment checked, pre-op evaluation and timeout performed Spinal Block Patient position: sitting Prep: DuraPrep and site prepped and draped Patient monitoring: continuous pulse ox, blood pressure and heart rate Approach: midline Location: L3-4 Injection technique: single-shot Needle Needle type: Pencan  Needle gauge: 24 G Needle length: 10 cm Assessment Events: Aborted Additional Notes Functioning IV was confirmed and monitors were applied. Sterile prep and drape, including hand hygiene and sterile gloves were used. The patient was positioned and the spine was prepped. The skin was anesthetized with lidocaine.  Despite multiple attempts by CRNA and 2 anesthesiologists we were unable to obtain CSF flow and procedure was aborted. The patient tolerated the procedure well.

## 2021-05-11 NOTE — Anesthesia Postprocedure Evaluation (Signed)
Anesthesia Post Note  Patient: [name], MD  Procedure(s) Performed: TOTAL KNEE ARTHROPLASTY (Right: Knee)     Patient location during evaluation: PACU Anesthesia Type: General Level of consciousness: awake and alert Pain management: pain level controlled Vital Signs Assessment: post-procedure vital signs reviewed and stable Respiratory status: spontaneous breathing, nonlabored ventilation and respiratory function stable Cardiovascular status: blood pressure returned to baseline and stable Postop Assessment: no apparent nausea or vomiting Anesthetic complications: no   No notable events documented.  Last Vitals:  Vitals:   05/11/21 1630 05/11/21 1655  BP: (!) 131/95 133/87  Pulse: 90 86  Resp: 19 19  Temp: 36.8 C 36.7 C  SpO2: 98% 100%    Last Pain:  Vitals:   05/11/21 1655  TempSrc: Oral  PainSc:                  Lidia Collum

## 2021-05-11 NOTE — Anesthesia Procedure Notes (Signed)
Anesthesia Regional Block: Adductor canal block   Pre-Anesthetic Checklist: , timeout performed,  Correct Patient, Correct Site, Correct Laterality,  Correct Procedure, Correct Position, site marked,  Risks and benefits discussed,  Surgical consent,  Pre-op evaluation,  At surgeon's request and post-op pain management  Laterality: Right  Prep: chloraprep       Needles:  Injection technique: Single-shot  Needle Type: Echogenic Stimulator Needle     Needle Length: 10cm  Needle Gauge: 20     Additional Needles:   Procedures:,,,, ultrasound used (permanent image in chart),,    Narrative:  Start time: 05/11/2021 12:59 PM End time: 05/11/2021 1:03 PM Injection made incrementally with aspirations every 5 mL.  Performed by: Personally  Anesthesiologist: Lidia Collum, MD  Additional Notes: Standard monitors applied. Skin prepped. Good needle visualization with ultrasound. Injection made in 5cc increments with no resistance to injection. Patient tolerated the procedure well.

## 2021-05-12 ENCOUNTER — Other Ambulatory Visit (HOSPITAL_COMMUNITY): Payer: Self-pay

## 2021-05-12 ENCOUNTER — Encounter (HOSPITAL_COMMUNITY): Payer: Self-pay | Admitting: Orthopedic Surgery

## 2021-05-12 ENCOUNTER — Other Ambulatory Visit: Payer: Self-pay

## 2021-05-12 DIAGNOSIS — M1711 Unilateral primary osteoarthritis, right knee: Secondary | ICD-10-CM | POA: Diagnosis not present

## 2021-05-12 LAB — CBC
HCT: 34.8 % — ABNORMAL LOW (ref 39.0–52.0)
Hemoglobin: 11.2 g/dL — ABNORMAL LOW (ref 13.0–17.0)
MCH: 28.9 pg (ref 26.0–34.0)
MCHC: 32.2 g/dL (ref 30.0–36.0)
MCV: 89.7 fL (ref 80.0–100.0)
Platelets: 164 10*3/uL (ref 150–400)
RBC: 3.88 MIL/uL — ABNORMAL LOW (ref 4.22–5.81)
RDW: 13.2 % (ref 11.5–15.5)
WBC: 11.8 10*3/uL — ABNORMAL HIGH (ref 4.0–10.5)
nRBC: 0 % (ref 0.0–0.2)

## 2021-05-12 LAB — BASIC METABOLIC PANEL
Anion gap: 9 (ref 5–15)
BUN: 19 mg/dL (ref 8–23)
CO2: 23 mmol/L (ref 22–32)
Calcium: 8.2 mg/dL — ABNORMAL LOW (ref 8.9–10.3)
Chloride: 98 mmol/L (ref 98–111)
Creatinine, Ser: 1.01 mg/dL (ref 0.61–1.24)
GFR, Estimated: >60 mL/min (ref >60)
Glucose, Bld: 268 mg/dL — ABNORMAL HIGH (ref 70–99)
Potassium: 4.8 mmol/L (ref 3.5–5.1)
Sodium: 130 mmol/L — ABNORMAL LOW (ref 135–145)

## 2021-05-12 LAB — GLUCOSE, CAPILLARY
Glucose-Capillary: 234 mg/dL — ABNORMAL HIGH (ref 70–99)
Glucose-Capillary: 236 mg/dL — ABNORMAL HIGH (ref 70–99)

## 2021-05-12 MED ORDER — TRAMADOL HCL 50 MG PO TABS
50.0000 mg | ORAL_TABLET | Freq: Four times a day (QID) | ORAL | 0 refills | Status: DC | PRN
  Filled 2021-05-12: qty 40, 5d supply, fill #0

## 2021-05-12 MED ORDER — OXYCODONE HCL 5 MG PO TABS
5.0000 mg | ORAL_TABLET | Freq: Four times a day (QID) | ORAL | 0 refills | Status: DC | PRN
  Filled 2021-05-12: qty 42, 5d supply, fill #0

## 2021-05-12 MED ORDER — METHOCARBAMOL 500 MG PO TABS
500.0000 mg | ORAL_TABLET | Freq: Four times a day (QID) | ORAL | 0 refills | Status: DC | PRN
Start: 2021-05-12 — End: 2021-10-27
  Filled 2021-05-12: qty 40, 10d supply, fill #0

## 2021-05-12 MED ORDER — ASPIRIN 325 MG PO TBEC
325.0000 mg | DELAYED_RELEASE_TABLET | Freq: Every day | ORAL | 0 refills | Status: AC
  Filled 2021-05-12: qty 20, 20d supply, fill #0

## 2021-05-12 MED ORDER — GABAPENTIN 300 MG PO CAPS
ORAL_CAPSULE | ORAL | 0 refills | Status: DC
  Filled 2021-05-12: qty 84, 42d supply, fill #0

## 2021-05-12 NOTE — Progress Notes (Signed)
Physical Therapy Treatment Patient Details Name: Ryan CUTSFORTH, MD MRN: [id number] DOB: 07-30-1950 Today's Date: 05/12/2021   History of Present Illness Patient is 70 y.o. male s/p Rt TKA on 05/11/21 with PMH significant for OA, GERD, HTN, MI, obesity, DMII, Bil THA, Lt TKA in 2012.    PT Comments    Pt assisted with ambulating in hallway and performed LE exercises.  Pt to practice stairs this afternoon when daughter is present.    Recommendations for follow up therapy are one component of a multi-disciplinary discharge planning process, led by the attending physician.  Recommendations may be updated based on patient status, additional functional criteria and insurance authorization.  Follow Up Recommendations  Follow physician's recommendations for discharge plan and follow up therapies (plan is for OPPT)     Assistance Recommended at Discharge Frequent or constant Supervision/Assistance  Equipment Recommendations  None recommended by PT    Recommendations for Other Services       Precautions / Restrictions Precautions Precautions: Fall;Knee Restrictions RLE Weight Bearing: Weight bearing as tolerated     Mobility  Bed Mobility Overal bed mobility: Needs Assistance Bed Mobility: Supine to Sit     Supine to sit: HOB elevated;Min assist     General bed mobility comments: increased time and effort, assist for R LE    Transfers Overall transfer level: Needs assistance Equipment used: Rolling walker (2 wheels) Transfers: Sit to/from Stand Sit to Stand: Min guard           General transfer comment: verbal cues for UE and LE Positioning, increased time and effort to rise and steady; min/guard for safety    Ambulation/Gait Ambulation/Gait assistance: Min guard Gait Distance (Feet): 80 Feet Assistive device: Rolling walker (2 wheels) Gait Pattern/deviations: Step-to pattern;Decreased stance time - right;Antalgic;Trunk flexed Gait velocity: decr     General  Gait Details: verbal cues for sequence, RW positioning, and posture; pt with forward flexed posture at baseline   Stairs             Wheelchair Mobility    Modified Rankin (Stroke Patients Only)       Balance                                            Cognition Arousal/Alertness: Awake/alert Behavior During Therapy: WFL for tasks assessed/performed Overall Cognitive Status: Within Functional Limits for tasks assessed                                          Exercises Total Joint Exercises Ankle Circles/Pumps: AROM;Both;10 reps Quad Sets: AROM;Right;10 reps Heel Slides: AAROM;Right;10 reps Hip ABduction/ADduction: AAROM;Right;10 reps Straight Leg Raises: AAROM;Right;10 reps    General Comments        Pertinent Vitals/Pain Pain Assessment: 0-10 Pain Score: 3  Pain Location: Rt knee Pain Descriptors / Indicators: Aching;Sore Pain Intervention(s): Repositioned;Monitored during session    Home Living                          Prior Function            PT Goals (current goals can now be found in the care plan section) Progress towards PT goals: Progressing toward goals    Frequency    7X/week  PT Plan Current plan remains appropriate    Co-evaluation              AM-PAC PT "6 Clicks" Mobility   Outcome Measure  Help needed turning from your back to your side while in a flat bed without using bedrails?: A Little Help needed moving from lying on your back to sitting on the side of a flat bed without using bedrails?: A Little Help needed moving to and from a bed to a chair (including a wheelchair)?: A Little Help needed standing up from a chair using your arms (e.g., wheelchair or bedside chair)?: A Little Help needed to walk in hospital room?: A Little Help needed climbing 3-5 steps with a railing? : A Lot 6 Click Score: 17    End of Session Equipment Utilized During Treatment: Gait  belt Activity Tolerance: Patient tolerated treatment well Patient left: in chair;with call bell/phone within reach;with chair alarm set Nurse Communication: Mobility status PT Visit Diagnosis: Muscle weakness (generalized) (M62.81);Difficulty in walking, not elsewhere classified (R26.2)     Time: 6063-0160 PT Time Calculation (min) (ACUTE ONLY): 19 min  Charges:  $Therapeutic Exercise: 8-22 mins                    Jannette Spanner PT, DPT Acute Rehabilitation Services Pager: [phone] Office: [place] 05/12/2021, 4:30 PM

## 2021-05-12 NOTE — Plan of Care (Signed)
?  Problem: Education: ?Goal: Knowledge of the prescribed therapeutic regimen will improve ?Outcome: Progressing ?  ?Problem: Activity: ?Goal: Ability to avoid complications of mobility impairment will improve ?Outcome: Progressing ?  ?Problem: Pain Management: ?Goal: Pain level will decrease with appropriate interventions ?Outcome: Progressing ?  ?Problem: Skin Integrity: ?Goal: Will show signs of wound healing ?Outcome: Progressing ?  ?

## 2021-05-12 NOTE — Progress Notes (Signed)
Provided discharge education/instructions, all questions and concerns addressed, Pt not in acute distress, to discharge home with belongings accompanied by daughter.

## 2021-05-12 NOTE — Progress Notes (Signed)
Physical Therapy Treatment Patient Details Name: Ryan LINCH, MD MRN: [id number] DOB: April 26, 1951 Today's Date: 05/12/2021   History of Present Illness Patient is 70 y.o. male s/p Rt TKA on 05/11/21 with PMH significant for OA, GERD, HTN, MI, obesity, DMII, Bil THA, Lt TKA in 2012.    PT Comments    Pt ambulated to stairs and practiced safe technique with daughter observing and then holding RW.  Pt initially required min assist however min/guard assist with second performance.  Recommend pt have 2 person assist (one for holding RW and one for holding/assisting at gait belt for pt) upon d/c home.  Pt's daughter uncertain if +2 is available today.  Pt provided with HEP and stair handouts in case of d/c home.      Recommendations for follow up therapy are one component of a multi-disciplinary discharge planning process, led by the attending physician.  Recommendations may be updated based on patient status, additional functional criteria and insurance authorization.  Follow Up Recommendations  Follow physician's recommendations for discharge plan and follow up therapies     Assistance Recommended at Discharge Frequent or constant Supervision/Assistance  Equipment Recommendations  None recommended by PT    Recommendations for Other Services       Precautions / Restrictions Precautions Precautions: Fall;Knee Restrictions RLE Weight Bearing: Weight bearing as tolerated     Mobility  Bed Mobility Overal bed mobility: Needs Assistance Bed Mobility: Supine to Sit     Supine to sit: HOB elevated;Min assist     General bed mobility comments: pt in recliner    Transfers Overall transfer level: Needs assistance Equipment used: Rolling walker (2 wheels) Transfers: Sit to/from Stand Sit to Stand: Min guard           General transfer comment: verbal cues for UE and LE Positioning, increased time and effort to rise and steady; min/guard for safety     Ambulation/Gait Ambulation/Gait assistance: Min guard Gait Distance (Feet): 100 Feet Assistive device: Rolling walker (2 wheels) Gait Pattern/deviations: Step-to pattern;Decreased stance time - right;Antalgic;Trunk flexed Gait velocity: decr     General Gait Details: verbal cues for sequence, RW positioning, and posture; pt with forward flexed posture at baseline; took seated rest break prior to stairs   Stairs Stairs: Yes Stairs assistance: Min assist;Min guard Stair Management: Step to pattern;Backwards;With walker Number of Stairs: 2 General stair comments: pt required cues for technique, RW positoining, safety; required assist initially for stability; performed a second time and min/guard with daughter holding RW; recommended 2 person assist upon d/c for safety   Wheelchair Mobility    Modified Rankin (Stroke Patients Only)       Balance                                            Cognition Arousal/Alertness: Awake/alert Behavior During Therapy: WFL for tasks assessed/performed Overall Cognitive Status: Within Functional Limits for tasks assessed                                          Exercises    General Comments        Pertinent Vitals/Pain Pain Assessment: 0-10 Pain Score: 4  Pain Location: Rt knee Pain Descriptors / Indicators: Aching;Sore Pain Intervention(s): Repositioned;Monitored during session  Home Living                          Prior Function            PT Goals (current goals can now be found in the care plan section) Progress towards PT goals: Progressing toward goals    Frequency    7X/week      PT Plan Current plan remains appropriate    Co-evaluation              AM-PAC PT "6 Clicks" Mobility   Outcome Measure  Help needed turning from your back to your side while in a flat bed without using bedrails?: A Little Help needed moving from lying on your back to sitting  on the side of a flat bed without using bedrails?: A Little Help needed moving to and from a bed to a chair (including a wheelchair)?: A Little Help needed standing up from a chair using your arms (e.g., wheelchair or bedside chair)?: A Little Help needed to walk in hospital room?: A Little Help needed climbing 3-5 steps with a railing? : A Lot 6 Click Score: 17    End of Session Equipment Utilized During Treatment: Gait belt Activity Tolerance: Patient tolerated treatment well Patient left: in chair;with call bell/phone within reach;with nursing/sitter in room;with chair alarm set Nurse Communication: Mobility status PT Visit Diagnosis: Muscle weakness (generalized) (M62.81);Difficulty in walking, not elsewhere classified (R26.2)     Time: 2774-1287 PT Time Calculation (min) (ACUTE ONLY): 27 min  Charges:  $Gait Training: 23-37 mins                    Jannette Spanner PT, DPT Acute Rehabilitation Services Pager: [phone] Office: [place] 05/12/2021, 4:37 PM

## 2021-05-12 NOTE — TOC Transition Note (Signed)
Transition of Care [facility]) - CM/SW Discharge Note  Patient Details  Name: Ryan NG, MD MRN: [id number] Date of Birth: 21-Jun-1950  Transition of Care [facility]) CM/SW Contact:  Sherie Don, LCSW Phone Number: 05/12/2021, 10:10 AM  Clinical Narrative: Patient is expected to discharge home after working with PT. CSW met with patient to confirm discharge plan. Patient will go to [place] at [facility] Patient has 2 rolling walkers and elevated toilets at home, so there are no DME needs at this time. TOC signing off.  Final next level of care: OP Rehab Barriers to Discharge: No Barriers Identified  Patient Goals and CMS Choice Patient states their goals for this hospitalization and ongoing recovery are:: Discharge home with OPPT at [place] offered to / list presented to : NA  Discharge Plan and Services       DME Arranged: N/A DME Agency: NA  Readmission Risk Interventions No flowsheet data found.

## 2021-05-12 NOTE — Progress Notes (Signed)
Subjective: 1 Day Post-Op Procedure(s) (LRB): TOTAL KNEE ARTHROPLASTY (Right) Patient reports pain as mild.   Patient seen in rounds by Dr. Wynelle Link. Patient is well, and has had no acute complaints or problems. No issues overnight. Denies chest pain, SOB, or calf pain. Voiding without difficulty. We will continue therapy today.  Objective: Vital signs in last 24 hours: Temp:  [97.2 F (36.2 C)-98.3 F (36.8 C)] 98.1 F (36.7 C) (12/20 0746) Pulse Rate:  [81-95] 91 (12/20 0746) Resp:  [12-27] 15 (12/20 0746) BP: (112-147)/(78-100) 122/93 (12/20 0746) SpO2:  [97 %-100 %] 97 % (12/20 0746) Weight:  [95.7 kg] 95.7 kg (12/19 1021)  Intake/Output from previous day:  Intake/Output Summary (Last 24 hours) at 05/12/2021 0754 Last data filed at 05/12/2021 8144 Gross per 24 hour  Intake 2045.17 ml  Output 2140 ml  Net -94.83 ml     Intake/Output this shift: Total I/O In: -  Out: 240 [Urine:240]  Labs: Recent Labs    05/12/21 0343  HGB 11.2*   Recent Labs    05/12/21 0343  WBC 11.8*  RBC 3.88*  HCT 34.8*  PLT 164   Recent Labs    05/12/21 0343  NA 130*  K 4.8  CL 98  CO2 23  BUN 19  CREATININE 1.01  GLUCOSE 268*  CALCIUM 8.2*   No results for input(s): LABPT, INR in the last 72 hours.  Exam: General - Patient is Alert and Oriented Extremity - Neurologically intact Neurovascular intact Sensation intact distally Dorsiflexion/Plantar flexion intact Dressing - dressing C/D/I Motor Function - intact, moving foot and toes well on exam.   Past Medical History:  Diagnosis Date   Chronic combined systolic and diastolic CHF (congestive heart failure) (HCC)    Coronary atherosclerosis of native coronary artery    Dental crowns present    Essential hypertension, benign    states under control with meds., has been on med. x 15 yr.   GERD (gastroesophageal reflux disease)    Hepatitis    Drug induced hepatitis   MI (myocardial infarction) ([place]) 10/27/2002    Non-insulin dependent type 2 diabetes mellitus (HCC)    Obesity    Osteoarthritis    hips and knees   Pure hypercholesterolemia    Sleep apnea     Assessment/Plan: 1 Day Post-Op Procedure(s) (LRB): TOTAL KNEE ARTHROPLASTY (Right) Principal Problem:   OA (osteoarthritis) of knee Active Problems:   Primary osteoarthritis of right knee  Estimated body mass index is 35.11 kg/m as calculated from the following:   Height as of this encounter: 5\' 5"  (1.651 m).   Weight as of this encounter: 95.7 kg. Advance diet Up with therapy D/C IV fluids   Patient's anticipated LOS is less than 2 midnights, meeting these requirements: - Lives within 1 hour of care - Has a competent adult at home to recover with post-op recover - NO history of  - Chronic pain requiring opioids  - Heart attack  - Stroke  - DVT/VTE  - Cardiac arrhythmia  - Respiratory Failure/COPD  - Renal failure  - Anemia  - Advanced Liver disease  DVT Prophylaxis - Aspirin and Plavix Weight bearing as tolerated. Continue therapy.  Plan is to go Home after hospital stay. Plan for discharge later today if progresses with therapy and meeting goals. Scheduled for OPPT at [facility]). Follow-up in the office in 2 weeks.  The PDMP database was reviewed today prior to any opioid medications being prescribed to this patient.  Drue Dun  [facility] [phone] 05/12/2021, 7:54 AM

## 2021-05-13 NOTE — Discharge Summary (Signed)
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  Admit date: 05/11/2021 Discharge date: 05/13/2021  Admission Diagnoses:  Principal Problem:   OA (osteoarthritis) of knee Active Problems:   Primary osteoarthritis of right knee   Discharge Diagnoses:  Same  Past Medical History:  Diagnosis Date   Chronic combined systolic and diastolic CHF (congestive heart failure) (HCC)    Coronary atherosclerosis of native coronary artery    Dental crowns present    Essential hypertension, benign    states under control with meds., has been on med. x 15 yr.   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 Patient has severe unremitting pain that affects sleep, daily activities, and work/hobbies. After pre-op clearance the patient was taken to the operating room on 05/11/2021 and underwent  Procedure(s): TOTAL KNEE ARTHROPLASTY.    Patient was given perioperative antibiotics:  Anti-infectives (From admission, onward)    Start     Dose/Rate Route Frequency Ordered Stop   05/11/21 2000  ceFAZolin (ANCEF) IVPB 2g/100 mL premix        2 g 200 mL/hr over 30 Minutes Intravenous Every 6 hours 05/11/21 1627 05/12/21 0149   05/11/21 1015  ceFAZolin (ANCEF) IVPB 2g/100 mL premix        2 g 200 mL/hr over 30 Minutes Intravenous On call to O.R. 05/11/21 1002 05/11/21 1353        Patient was given sequential compression devices, early ambulation, and chemoprophylaxis to prevent  DVT.  Patient benefited maximally from hospital stay and there were no complications.    Recent vital signs: Patient Vitals for the past 24 hrs:  BP Temp Temp src Pulse Resp SpO2  05/12/21 1351 134/74 98.9 F (37.2 C) Oral (!) 101 17 99 %     Recent laboratory studies:  Recent Labs    05/12/21 0343  WBC 11.8*  HGB 11.2*  HCT 34.8*  PLT 164  NA 130*  K 4.8  CL 98  CO2 23  BUN 19  CREATININE 1.01  GLUCOSE 268*  CALCIUM 8.2*     Discharge Medications:   Allergies as of 05/12/2021   No Known Allergies      Medication List     STOP taking these medications    naproxen sodium 220 MG tablet Commonly known as: ALEVE   oxyCODONE-acetaminophen 5-325 MG tablet Commonly known as: Percocet       TAKE these medications    amLODipine 5 MG tablet Commonly known as: NORVASC TAKE 1 TABLET BY MOUTH ONCE DAILY What changed: how much to take   aspirin EC 325 MG tablet Take 1 tablet by mouth daily with breakfast for 20 days. Then take an 81 mg aspirin once a day for 3 weeks with your usual Plavix dose. Then discontinue aspirin.   atorvastatin 40 MG tablet Commonly known as: LIPITOR TAKE 1 TABLET BY MOUTH DAILY AT BEDTIME What changed:  how much to take how to take this when to take this  carvedilol 25 MG tablet Commonly known as: COREG TAKE 1 TABLET BY MOUTH TWO TIMES DAILY WITH A MEAL What changed: how much to take   clopidogrel 75 MG tablet Commonly known as: PLAVIX Take 1 tablet by mouth once daily What changed:  how much to take how to take this when to take this   Farxiga 10 MG Tabs tablet Generic drug: dapagliflozin propanediol Take 1 tablet by mouth once daily What changed:  how much to take when to take this   gabapentin 300 MG capsule Commonly known as: NEURONTIN Take 1 capsule by mouth 3 times a day for 2 weeks following surgery.Then take 1 capsule twice daily for 2 weeks. Then take 1 capsule once daily for 2 weeks. Then discontinue.    metFORMIN 500 MG 24 hr tablet Commonly known as: GLUCOPHAGE-XR Take 2 tablets by mouth twice daily What changed:  how much to take when to take this Notes to patient: Resume home regimen   methocarbamol 500 MG tablet Commonly known as: ROBAXIN Take 1 tablet by mouth every 6 hours as needed for muscle spasms. Notes to patient: Last dose given 12/20 12:54pm   oxyCODONE 5 MG immediate release tablet Commonly known as: Oxy IR/ROXICODONE Take 1 - 2 tablets by mouth every 6 hours as needed for severe pain.   ramipril 10 MG capsule Commonly known as: ALTACE Take 1 capsule (10 mg total) by mouth 2 (two) times daily. Notes to patient: Resume home regimen   traMADol 50 MG tablet Commonly known as: ULTRAM Take 1 - 2 tablets by mouth every 6 hours as needed for moderate pain.   triamterene-hydrochlorothiazide 37.5-25 MG capsule Commonly known as: DYAZIDE TAKE 1 CAPSULE BY MOUTH ONCE A DAY IN THE MORNING   Trulicity 3 AS/5.0NL Sopn Generic drug: Dulaglutide Inject 3 mg Subcutaneously once a week Notes to patient: Resume home regimen               Discharge Care Instructions  (From admission, onward)           Start     Ordered   05/12/21 0000  Weight bearing as tolerated        05/12/21 0758   05/12/21 0000  Change dressing       Comments: You may remove the bulky bandage (ACE wrap and gauze) two days after surgery. You will have an adhesive waterproof bandage underneath. Leave this in place until your first follow-up appointment.   05/12/21 0758            Diagnostic Studies: No results found.  Disposition: Discharge disposition: 01-Home or Self Care       Discharge Instructions     Call MD / Call 911   Complete by: As directed    If you experience chest pain or shortness of breath, CALL 911 and be transported to the hospital emergency room.  If you develope a fever above 101 F, pus (white drainage) or increased drainage or redness at the wound, or  calf pain, call your surgeon's office.   Change dressing   Complete by: As directed    You may remove the bulky bandage (ACE wrap and gauze) two days after surgery. You will have an adhesive waterproof bandage underneath. Leave this in place until your first follow-up appointment.   Constipation Prevention   Complete by: As directed    Drink plenty of fluids.  Prune juice may be helpful.  You may use a stool softener, such as Colace (over  the counter) 100 mg twice a day.  Use MiraLax (over the counter) for constipation as needed.   Diet - low sodium heart healthy   Complete by: As directed    Do not put a pillow under the knee. Place it under the heel.   Complete by: As directed    Driving restrictions   Complete by: As directed    No driving for two weeks   Post-operative opioid taper instructions:   Complete by: As directed    POST-OPERATIVE OPIOID TAPER INSTRUCTIONS: It is important to wean off of your opioid medication as soon as possible. If you do not need pain medication after your surgery it is ok to stop day one. Opioids include: Codeine, Hydrocodone(Norco, Vicodin), Oxycodone(Percocet, oxycontin) and hydromorphone amongst others.  Long term and even short term use of opiods can cause: Increased pain response Dependence Constipation Depression Respiratory depression And more.  Withdrawal symptoms can include Flu like symptoms Nausea, vomiting And more Techniques to manage these symptoms Hydrate well Eat regular healthy meals Stay active Use relaxation techniques(deep breathing, meditating, yoga) Do Not substitute Alcohol to help with tapering If you have been on opioids for less than two weeks and do not have pain than it is ok to stop all together.  Plan to wean off of opioids This plan should start within one week post op of your joint replacement. Maintain the same interval or time between taking each dose and first decrease the dose.  Cut the total daily intake  of opioids by one tablet each day Next start to increase the time between doses. The last dose that should be eliminated is the evening dose.      TED hose   Complete by: As directed    Use stockings (TED hose) for three weeks on both leg(s).  You may remove them at night for sleeping.   Weight bearing as tolerated   Complete by: As directed         Follow-up Information     Aluisio, Pilar Plate, MD. Schedule an appointment as soon as possible for a visit in 2 week(s).   Specialty: Orthopedic Surgery Contact information: [address] [place] [address] [phone]                  Signed: Theresa Duty 05/13/2021, 12:45 PM

## 2021-05-15 ENCOUNTER — Encounter (HOSPITAL_BASED_OUTPATIENT_CLINIC_OR_DEPARTMENT_OTHER): Payer: Self-pay | Admitting: Physical Therapy

## 2021-05-15 ENCOUNTER — Ambulatory Visit (HOSPITAL_BASED_OUTPATIENT_CLINIC_OR_DEPARTMENT_OTHER): Payer: 59 | Attending: Internal Medicine | Admitting: Physical Therapy

## 2021-05-15 ENCOUNTER — Other Ambulatory Visit: Payer: Self-pay

## 2021-05-15 ENCOUNTER — Ambulatory Visit (HOSPITAL_BASED_OUTPATIENT_CLINIC_OR_DEPARTMENT_OTHER): Payer: Self-pay | Admitting: Physical Therapy

## 2021-05-15 DIAGNOSIS — M25661 Stiffness of right knee, not elsewhere classified: Secondary | ICD-10-CM | POA: Diagnosis not present

## 2021-05-15 DIAGNOSIS — M25561 Pain in right knee: Secondary | ICD-10-CM | POA: Insufficient documentation

## 2021-05-15 DIAGNOSIS — R2689 Other abnormalities of gait and mobility: Secondary | ICD-10-CM | POA: Insufficient documentation

## 2021-05-15 NOTE — Therapy (Signed)
OUTPATIENT PHYSICAL THERAPY LOWER EXTREMITY EVALUATION   Patient Name: Ryan VONBARGEN, MD MRN: [id number] DOB:May 12, 1951, 70 y.o., male Today's Date: 05/15/2021    Past Medical History:  Diagnosis Date   Chronic combined systolic and diastolic CHF (congestive heart failure) (HCC)    Coronary atherosclerosis of native coronary artery    Dental crowns present    Essential hypertension, benign    states under control with meds., has been on med. x 15 yr.   GERD (gastroesophageal reflux disease)    Hepatitis    Drug induced hepatitis   MI (myocardial infarction) ([place]) 10/27/2002   Non-insulin dependent type 2 diabetes mellitus ([place])    Obesity    Osteoarthritis    hips and knees   Pure hypercholesterolemia    Sleep apnea    Past Surgical History:  Procedure Laterality Date   ANTERIOR INTEROSSEOUS NERVE DECOMPRESSION Right 04/15/2021   Procedure: ANTERIOR INTEROSSEOUS NERVE DECOMPRESSION;  Surgeon: [name], MD;  Location: [facility];  Service: Orthopedics;  Laterality: Right;  Only need 1 hour for case,  Axillary block/Mac   CARDIAC CATHETERIZATION  10/27/2002   CARPAL TUNNEL RELEASE Right 11/21/2013   CARPAL TUNNEL RELEASE Right 04/15/2021   Procedure: CARPAL TUNNEL RELEASE;  Surgeon: [name], MD;  Location: [facility];  Service: Orthopedics;  Laterality: Right;   CORONARY STENT PLACEMENT  10/27/2002   mid LAD   TOTAL HIP ARTHROPLASTY Left 04/09/1999   TOTAL HIP ARTHROPLASTY Right    TOTAL KNEE ARTHROPLASTY Left 02/03/2011   TOTAL KNEE ARTHROPLASTY Right 05/11/2021   Procedure: TOTAL KNEE ARTHROPLASTY;  Surgeon: Gaynelle Arabian, MD;  Location: [facility];  Service: Orthopedics;  Laterality: Right;   ULNAR TUNNEL RELEASE Left 06/18/2016   Procedure: LEFT CUBITAL TUNNEL RELEASE;  Surgeon: [name], MD;  Location: [facility];  Service: Orthopedics;  Laterality: Left;   Patient Active Problem List   Diagnosis  Date Noted   OA (osteoarthritis) of knee 05/11/2021   Primary osteoarthritis of right knee 05/11/2021   Colon cancer screening 10/31/2020   Gastroesophageal reflux disease 10/31/2020   Morbid obesity ([place]) 10/31/2020   Personal history of colonic polyps 10/31/2020   Pain of left hip joint 12/13/2019   AVNRT (AV nodal re-entry tachycardia) ([place])    Withdrawal arrhythmia (HCC)    Cubital tunnel syndrome, left 06/29/2016   CAD (coronary artery disease), native coronary artery 05/08/2014   Hyperlipidemia 03/21/2013    Class: Chronic   Old MI (myocardial infarction) 03/21/2013    Class: Chronic   DM (diabetes mellitus), type 2, uncontrolled 03/21/2013    Class: Chronic   Essential hypertension, benign 03/21/2013    Class: Chronic   Obesity (BMI 30-39.9) 03/21/2013   Chronic combined systolic and diastolic CHF (congestive heart failure) ([place])     PCP: Ginger Organ., MD  REFERRING PROVIDER: Dr Larey Dresser   REFERRING DIAG: Right TKA   THERAPY DIAG:  Right TKA   ONSET DATE: 03/11/2021  SUBJECTIVE:   SUBJECTIVE STATEMENT: Patient had a right TKA on 03/11/2021 . His pain has been well controlled since the surgery in his knee. He had a fall this morning. He reports no issues following the fall. He was advised to contact his MD ASAP following his fall. He is using a walker right now and a transport chair for longer distances like today.   PERTINENT HISTORY: Anterior interosseus decompression;  Left and right hip replacement  Left knee replacement Low Back Pain  PAIN:  PAIN:  Are you having pain? Yes VAS scale: 4-5/10 Pain location: right knee  Pain orientation: Right  PAIN TYPE: aching Pain description: intermittent  Aggravating factors: standing and walking  Relieving factors: rest    Are you having pain? Yes VAS scale: 10/10 when it comes  Pain location: Left Lower Lower Lumbar  Pain orientation: Left Lumbar  PAIN TYPE: aching/Spasming  Pain description:  intermittent  Aggravating factors: standing and walking Relieving factors: spasms go away   PRECAUTIONS: Fall  WEIGHT BEARING RESTRICTIONS Yes WBAT   FALLS:  Has patient fallen in last 6 months? Yes, Number of falls: Nothing   LIVING ENVIRONMENT: 4 steps into the house. 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   Observation:  Bilateral ankle swelling   LE AROM/PROM:  A/PROM Right 05/15/2021 Left 05/15/2021  Hip flexion    Hip extension    Hip abduction    Hip adduction    Hip internal rotation    Hip external rotation    Knee flexion    Knee extension    Ankle dorsiflexion    Ankle plantarflexion    Ankle inversion    Ankle eversion     (Blank rows = not tested)  LE MMT:  MMT Right 05/15/2021 Left 05/15/2021  Hip flexion 3/5 4+/5  Hip extension    Hip abduction 3/5 4+/5  Hip adduction    Hip internal rotation    Hip external rotation    Knee flexion 3/5 4+/5  Knee extension 3/5 4+/5  Ankle dorsiflexion    Ankle plantarflexion    Ankle inversion    Ankle eversion     (Blank rows = not tested)  FUNCTIONAL TESTS:  Min a sit to stand transfer; Mod a transfer from supine to sit GAIT: 5' with min a; Cuing to stay in the walker; Min a for balance and strength; decreased weight bearing on right leg; flexed posture.     TODAY'S TREATMENT:  Exercises Supine Quad Set 10 reps - 3-5 hold Supine Heel Slide with Strap 2 reps - 15sec hold Supine Knee Extension Stretch on Towel Roll 30 sec 1x 1 Seated Hamstring Stretch 3 reps - 20 sec hold  Manual Therapy: focus on extension stretching   PATIENT EDUCATION:  Education details: reviewed HEP; safety with transfers; importance of improving extension  Person educated: Patient and Child(ren) Education method: Explanation, Demonstration, Tactile cues, and Verbal cues Education comprehension: verbalized understanding, returned demonstration, verbal cues required, tactile cues required, and needs further education   HOME EXERCISE PROGRAM: Access Code: [email or web address] URL: [email or web address] Date: 05/15/2021 Prepared by: Carolyne Littles  Exercises Supine Quad Set - 1 x daily - 7 x weekly - 2 sets - 10 reps - 3-5 hold Supine Heel Slide with Strap - 1 x daily - 7 x weekly - 2 reps -  15sec hold Supine Knee Extension Stretch on Towel Roll - 1 x daily - 7 x weekly - 3 sets - 3 reps - 15sec hold Seated Hamstring Stretch - 1 x daily - 7 x weekly - 3 sets - 3 reps - 20 sec hold   ASSESSMENT:  CLINICAL IMPRESSION: Patient is a 70 year old male S/P right TKA. At this point his mobility is significantly limited. He required assist to ambulate, transfer , and for bed mobility. His daughter reports it was difficult to get him to therapy on time this morning because of mobility restrictions. We talked to her about the potential of home health if needed to improve initial mobility. He required min a for ambulation and mod a for bed mobility. He had a fall this morning. He reports no residual effects. He has had significant back spasms since the surgery His daughter is going to work on getting him a wheelchair for his appointments. He has a significant limitation in knee extension. We will focus on that in therapy as well as improving his overall mobility. His  daughter will talk to the patient about home health. He would benefit from skilled therapy to return to prior level of function.   Objective impairments include Abnormal gait, decreased activity tolerance, decreased balance, decreased endurance, decreased knowledge of use of DME, difficulty walking, decreased ROM, decreased strength, increased fascial restrictions, impaired sensation, impaired UE functional use, improper body mechanics, and pain. These impairments are limiting patient from cleaning, community activity, driving, occupation, yard work, and shopping. Personal factors including 3+ comorbidities: DMII, multi joint replacement; chronic back pain   are also affecting patient's functional outcome. Patient will benefit from skilled PT to address above impairments and improve overall function.  REHAB POTENTIAL: Good  CLINICAL DECISION MAKING: Evolving/moderate complexity signifcant mobility limitations   EVALUATION COMPLEXITY: Moderate   GOALS: Goals reviewed with patient? No  SHORT TERM GOALS:  STG Name Target Date Goal status  1 Patient will improve passive knee extension by 10 degrees  Baseline:  06/12/2021 INITIAL  2 Patient will ambulate 20' with supervision and walker  Baseline:  06/12/2021 INITIAL  3 Patient will transfer sit to stand with supervision  Baseline: 06/12/2021 INITIAL  LONG TERM GOALS:   LTG Name Target Date Goal status  1 Patient will go up and down 4 steps without pain in order to get in and out of his house  Baseline: 07/10/2021 INITIAL  2 Patient will ambulate 500' without increased pain with LRAD in order to go to his MD appointments  Baseline: 07/10/2021 INITIAL  3 Patient will be independent with bed mobility  Baseline: 07/10/2021 INITIAL  PLAN: PT FREQUENCY: 2x/week  PT DURATION: 8 weeks  PLANNED INTERVENTIONS: Therapeutic exercises, Therapeutic activity, Neuro Muscular re-education, Balance training, Gait training, Patient/Family education, Joint  mobilization, Stair training, DME instructions, Aquatic Therapy, Dry Needling, Electrical stimulation, Cryotherapy, Taping, Vasopneumatic device, Ultrasound, and Manual therapy  PLAN FOR NEXT SESSION: PROM with focus on extension; work on sit to stand transfer; general strengthening; consider manual therapy to his back; general TKE exercises depending on what positions he can tolerate.    Carney Living PT DPT  05/15/2021, 9:38 AM

## 2021-05-19 ENCOUNTER — Encounter (HOSPITAL_BASED_OUTPATIENT_CLINIC_OR_DEPARTMENT_OTHER): Payer: Self-pay | Admitting: Physical Therapy

## 2021-05-19 ENCOUNTER — Ambulatory Visit (HOSPITAL_BASED_OUTPATIENT_CLINIC_OR_DEPARTMENT_OTHER): Payer: 59 | Admitting: Physical Therapy

## 2021-05-19 ENCOUNTER — Other Ambulatory Visit: Payer: Self-pay

## 2021-05-19 DIAGNOSIS — M25661 Stiffness of right knee, not elsewhere classified: Secondary | ICD-10-CM | POA: Diagnosis not present

## 2021-05-19 DIAGNOSIS — M25561 Pain in right knee: Secondary | ICD-10-CM | POA: Diagnosis not present

## 2021-05-19 DIAGNOSIS — R2689 Other abnormalities of gait and mobility: Secondary | ICD-10-CM

## 2021-05-19 NOTE — Therapy (Signed)
OUTPATIENT PHYSICAL THERAPY TREATMENT NOTE   Patient Name: Ryan MCCLISH, MD MRN: [id number] DOB:07/27/1950, 70 y.o., male Today's Date: 05/20/2021  PCP: Ginger Organ., MD REFERRING PROVIDER: Ginger Organ., MD   PT End of Session - 05/19/21 2300     Visit Number 2    Number of Visits 16    Date for PT Re-Evaluation 07/10/21    PT Start Time 1446    PT Stop Time 1525    PT Time Calculation (min) 39 min    Activity Tolerance Patient tolerated treatment well    Behavior During Therapy [facility] for tasks assessed/performed             Past Medical History:  Diagnosis Date   Chronic combined systolic and diastolic CHF (congestive heart failure) (HCC)    Coronary atherosclerosis of native coronary artery    Dental crowns present    Essential hypertension, benign    states under control with meds., has been on med. x 15 yr.   GERD (gastroesophageal reflux disease)    Hepatitis    Drug induced hepatitis   MI (myocardial infarction) ([place]) 10/27/2002   Non-insulin dependent type 2 diabetes mellitus ([place])    Obesity    Osteoarthritis    hips and knees   Pure hypercholesterolemia    Sleep apnea    Past Surgical History:  Procedure Laterality Date   ANTERIOR INTEROSSEOUS NERVE DECOMPRESSION Right 04/15/2021   Procedure: ANTERIOR INTEROSSEOUS NERVE DECOMPRESSION;  Surgeon: [name], MD;  Location: [place];  Service: Orthopedics;  Laterality: Right;  Only need 1 hour for case,  Axillary block/Mac   CARDIAC CATHETERIZATION  10/27/2002   CARPAL TUNNEL RELEASE Right 11/21/2013   CARPAL TUNNEL RELEASE Right 04/15/2021   Procedure: CARPAL TUNNEL RELEASE;  Surgeon: [name], MD;  Location: [place];  Service: Orthopedics;  Laterality: Right;   CORONARY STENT PLACEMENT  10/27/2002   mid LAD   TOTAL HIP ARTHROPLASTY Left 04/09/1999   TOTAL HIP ARTHROPLASTY Right    TOTAL KNEE ARTHROPLASTY Left 02/03/2011   TOTAL KNEE  ARTHROPLASTY Right 05/11/2021   Procedure: TOTAL KNEE ARTHROPLASTY;  Surgeon: Gaynelle Arabian, MD;  Location: [facility];  Service: Orthopedics;  Laterality: Right;   ULNAR TUNNEL RELEASE Left 06/18/2016   Procedure: LEFT CUBITAL TUNNEL RELEASE;  Surgeon: [name], MD;  Location: [place];  Service: Orthopedics;  Laterality: Left;   Patient Active Problem List   Diagnosis Date Noted   OA (osteoarthritis) of knee 05/11/2021   Primary osteoarthritis of right knee 05/11/2021   Colon cancer screening 10/31/2020   Gastroesophageal reflux disease 10/31/2020   Morbid obesity ([place]) 10/31/2020   Personal history of colonic polyps 10/31/2020   Pain of left hip joint 12/13/2019   AVNRT (AV nodal re-entry tachycardia) ([place])    Withdrawal arrhythmia ([place])    Cubital tunnel syndrome, left 06/29/2016   CAD (coronary artery disease), native coronary artery 05/08/2014   Hyperlipidemia 03/21/2013    Class: Chronic   Old MI (myocardial infarction) 03/21/2013    Class: Chronic   DM (diabetes mellitus), type 2, uncontrolled 03/21/2013    Class: Chronic   Essential hypertension, benign 03/21/2013    Class: Chronic   Obesity (BMI 30-39.9) 03/21/2013   Chronic combined systolic and diastolic CHF (congestive heart failure) (HCC)      THERAPY DIAG:  Acute pain of right knee  Stiffness of right knee, not elsewhere classified  Other abnormalities of gait and  mobility  REFERRING PROVIDER: Dr Larey Dresser       REFERRING DIAG: Right TKA    THERAPY DIAG:  Right TKA    ONSET DATE: 03/11/2021   SUBJECTIVE:    SUBJECTIVE STATEMENT: Patient had a right TKA on 03/11/2021. Pt's daughter reports he is using the towel roll for knee extension stretch at home.  She also states pt is getting up every hour to walk in home with FWW.  Pt reports compliance with HEP.     PERTINENT HISTORY: Anterior interosseus decompression;  Left and right hip replacement  Left knee replacement Low  Back Pain    PAIN:  PAIN:   Are you having pain? Yes NPRS scale: 2/10  Pain location: R knee Pain orientation: Left Lumbar  PAIN TYPE: aching/Spasming  Pain description: intermittent  Aggravating factors: standing and walking Relieving factors: spasms go away    PRECAUTIONS: Fall   WEIGHT BEARING RESTRICTIONS Yes WBAT      PLOF: Independent   PATIENT GOALS  Get back moving comfortably      OBJECTIVE:      PATIENT SURVEYS:  FOTO       Observation:  Bilateral ankle swelling. swelling in distal R LE and ankle.      TODAY'S TREATMENT: Therapeutic Exercise:  Reviewed pain level, response to prior Rx, and HEP compliance. Reviewed HEP. Supine Quad Set 10 reps - 3-5 hold Supine Heel Slide with Strap 10 reps with 5 sec hold SAQ 2x10 reps Supine hip abd/add 2x10 reps  Seated Hamstring Stretch 3 reps - 20 sec hold Ankle pumps x 20 reps -See below for pt education   Manual Therapy:  -Manual Knee extension stretch with PT hand under heel -Pt received R knee flexion PROM seated at EOT.     PATIENT EDUCATION:  Education details: reviewed HEP;  swelling reduction; instructed pt in using ice.  Instructed pt in elevating R LE and performing ankle pumps to reduce swelling; safety with transfers; importance of improving extension  Person educated: Patient and Child(ren) Education method: Explanation, Demonstration, Tactile cues, and Verbal cues Education comprehension: verbalized understanding, returned demonstration, verbal cues required, tactile cues required, and needs further education     HOME EXERCISE PROGRAM: Access Code: [email or web address] URL: [email or web address] Date: 05/15/2021 Prepared by: Carolyne Littles   Exercises Supine Quad Set - 1 x daily - 7 x weekly - 2 sets - 10 reps - 3-5 hold Supine Heel Slide with Strap - 1 x daily - 7 x weekly - 2 reps - 15sec hold Supine Knee Extension Stretch on Towel Roll - 1 x daily - 7 x weekly - 3 sets - 3 reps -  15sec hold Seated Hamstring Stretch - 1 x daily - 7 x weekly - 3 sets - 3 reps - 20 sec hold     ASSESSMENT:   CLINICAL IMPRESSION: Patient arrived to Rx in a W/C.  He has improved mobility with performing sit to stand transfers compared to initial evaluation though does still require assistance.  He is very limited and tight with knee extension ROM though demonstrates improved extension ROM with MT and TE based upon visual observation.  Pt did well with knee flexion AAROM and tolerated flexion PROM well.  Pt performed exercises well with cuing and instruction for correct form.  Pt responded well to Rx having no c/o's after Rx.  He would benefit from skilled therapy to improve ROM and strength, address goals, and return to prior level of function.  Objective impairments include Abnormal gait, decreased activity tolerance, decreased balance, decreased endurance, decreased knowledge of use of DME, difficulty walking, decreased ROM, decreased strength, increased fascial restrictions, impaired sensation, impaired UE functional use, improper body mechanics, and pain. These impairments are limiting patient from cleaning, community activity, driving, occupation, yard work, and shopping. Personal factors including 3+ comorbidities: DMII, multi joint replacement; chronic back pain   are also affecting patient's functional outcome.    REHAB POTENTIAL: Good   CLINICAL DECISION MAKING: Evolving/moderate complexity signifcant mobility limitations    EVALUATION COMPLEXITY: Moderate     GOALS: Goals reviewed with patient? No   SHORT TERM GOALS:   STG Name Target Date Goal status  1 Patient will improve passive knee extension by 10 degrees  Baseline:  06/12/2021 INITIAL  2 Patient will ambulate 20' with supervision and walker  Baseline:  06/12/2021 INITIAL  3 Patient will transfer sit to stand with supervision  Baseline: 06/12/2021 INITIAL  LONG TERM GOALS:    LTG Name Target Date Goal status  1  Patient will go up and down 4 steps without pain in order to get in and out of his house  Baseline: 07/10/2021 INITIAL  2 Patient will ambulate 500' without increased pain with LRAD in order to go to his MD appointments  Baseline: 07/10/2021 INITIAL  3 Patient will be independent with bed mobility  Baseline: 07/10/2021 INITIAL  PLAN: PT FREQUENCY: 2x/week   PT DURATION: 8 weeks   PLANNED INTERVENTIONS: Therapeutic exercises, Therapeutic activity, Neuro Muscular re-education, Balance training, Gait training, Patient/Family education, Joint mobilization, Stair training, DME instructions, Aquatic Therapy, Dry Needling, Electrical stimulation, Cryotherapy, Taping, Vasopneumatic device, Ultrasound, and Manual therapy   PLAN FOR NEXT SESSION: Cont per TKA protocol.  PROM with focus on extension; work on sit to stand transfer; general strengthening; consider manual therapy to his back; general TKE exercises depending on what positions he can tolerate.       Selinda Michaels III PT, DPT 05/20/21 5:33 PM

## 2021-05-21 ENCOUNTER — Encounter (HOSPITAL_BASED_OUTPATIENT_CLINIC_OR_DEPARTMENT_OTHER): Payer: Self-pay | Admitting: Physical Therapy

## 2021-05-21 ENCOUNTER — Ambulatory Visit (HOSPITAL_BASED_OUTPATIENT_CLINIC_OR_DEPARTMENT_OTHER): Payer: 59 | Admitting: Physical Therapy

## 2021-05-21 ENCOUNTER — Other Ambulatory Visit: Payer: Self-pay

## 2021-05-21 DIAGNOSIS — M25561 Pain in right knee: Secondary | ICD-10-CM

## 2021-05-21 DIAGNOSIS — M25661 Stiffness of right knee, not elsewhere classified: Secondary | ICD-10-CM | POA: Diagnosis not present

## 2021-05-21 DIAGNOSIS — R2689 Other abnormalities of gait and mobility: Secondary | ICD-10-CM

## 2021-05-21 NOTE — Therapy (Signed)
OUTPATIENT PHYSICAL THERAPY TREATMENT NOTE   Patient Name: Ryan MENZER, MD MRN: [id number] DOB:04-09-1951, 70 y.o., male Today's Date: 05/21/2021  PCP: Ginger Organ., MD REFERRING PROVIDER: Ginger Organ., MD    Past Medical History:  Diagnosis Date   Chronic combined systolic and diastolic CHF (congestive heart failure) [facility])    Coronary atherosclerosis of native coronary artery    Dental crowns present    Essential hypertension, benign    states under control with meds., has been on med. x 15 yr.   GERD (gastroesophageal reflux disease)    Hepatitis    Drug induced hepatitis   MI (myocardial infarction) ([place]) 10/27/2002   Non-insulin dependent type 2 diabetes mellitus ([place])    Obesity    Osteoarthritis    hips and knees   Pure hypercholesterolemia    Sleep apnea    Past Surgical History:  Procedure Laterality Date   ANTERIOR INTEROSSEOUS NERVE DECOMPRESSION Right 04/15/2021   Procedure: ANTERIOR INTEROSSEOUS NERVE DECOMPRESSION;  Surgeon: [name], MD;  Location: [place];  Service: Orthopedics;  Laterality: Right;  Only need 1 hour for case,  Axillary block/Mac   CARDIAC CATHETERIZATION  10/27/2002   CARPAL TUNNEL RELEASE Right 11/21/2013   CARPAL TUNNEL RELEASE Right 04/15/2021   Procedure: CARPAL TUNNEL RELEASE;  Surgeon: [name], MD;  Location: [place];  Service: Orthopedics;  Laterality: Right;   CORONARY STENT PLACEMENT  10/27/2002   mid LAD   TOTAL HIP ARTHROPLASTY Left 04/09/1999   TOTAL HIP ARTHROPLASTY Right    TOTAL KNEE ARTHROPLASTY Left 02/03/2011   TOTAL KNEE ARTHROPLASTY Right 05/11/2021   Procedure: TOTAL KNEE ARTHROPLASTY;  Surgeon: Gaynelle Arabian, MD;  [facility];  Service: Orthopedics;  Laterality: Right;   ULNAR TUNNEL RELEASE Left 06/18/2016   Procedure: LEFT CUBITAL TUNNEL RELEASE;  Surgeon: [name], MD;  Location: [place];  Service: Orthopedics;   Laterality: Left;   Patient Active Problem List   Diagnosis Date Noted   OA (osteoarthritis) of knee 05/11/2021   Primary osteoarthritis of right knee 05/11/2021   Colon cancer screening 10/31/2020   Gastroesophageal reflux disease 10/31/2020   Morbid obesity ([place]) 10/31/2020   Personal history of colonic polyps 10/31/2020   Pain of left hip joint 12/13/2019   AVNRT (AV nodal re-entry tachycardia) ([place])    Withdrawal arrhythmia (HCC)    Cubital tunnel syndrome, left 06/29/2016   CAD (coronary artery disease), native coronary artery 05/08/2014   Hyperlipidemia 03/21/2013    Class: Chronic   Old MI (myocardial infarction) 03/21/2013    Class: Chronic   DM (diabetes mellitus), type 2, uncontrolled 03/21/2013    Class: Chronic   Essential hypertension, benign 03/21/2013    Class: Chronic   Obesity (BMI 30-39.9) 03/21/2013   Chronic combined systolic and diastolic CHF (congestive heart failure) ([place])     REFERRING PROVIDER: Dr Larey Dresser       REFERRING DIAG: Right TKA    THERAPY DIAG:  Right TKA    ONSET DATE: 03/11/2021   SUBJECTIVE:    SUBJECTIVE STATEMENT: Patient reports he has done pretty well with therapy    PERTINENT HISTORY: Anterior interosseus decompression;  Left and right hip replacement  Left knee replacement Low Back Pain    PAIN:  PAIN:   Are you having pain? Yes NPRS scale: 4/10  Pain location: R knee Pain orientation: Left Lumbar  PAIN TYPE: aching/Spasming  Pain description: intermittent  Aggravating factors: standing and  walking Relieving factors: spasms go away    PRECAUTIONS: Fall   WEIGHT BEARING RESTRICTIONS Yes WBAT      PLOF: Independent   PATIENT GOALS  Get back moving comfortably      OBJECTIVE:   right knee extension - 10 degrees   Right knee flexion 90 degrees   PATIENT SURVEYS:  FOTO  32 % ability 61% expected in 16 visits      Observation:  Bilateral ankle swelling. swelling in distal R LE and ankle.        TODAY'S TREATMENT: 12/29 Nu-step 5 min L1 with cuing for motion. Min a to get on   Quad set 2x10  SAQ 2x10  Supine hip abduction 2x10   Standing weight shift2x10  Standing march with UE support 2x5   Manual therapy  Manual Therapy:  -Manual Knee extension stretch with PT hand under heel -Pt received R knee flexion PROM supine  Reviewed pain level, response to prior Rx, and HEP compliance. Reviewed HEP.  Gait: 2x20' cuing to stand straight and shorten his stride. 2nd trial he had improved gait.  12/27  Therapeutic Exercise:  Reviewed pain level, response to prior Rx, and HEP compliance. Reviewed HEP. Supine Quad Set 10 reps - 3-5 hold Supine Heel Slide with Strap 10 reps with 5 sec hold SAQ 2x10 reps Supine hip abd/add 2x10 reps  Seated Hamstring Stretch 3 reps - 20 sec hold Ankle pumps x 20 reps -See below for pt education   Manual Therapy:  -Manual Knee extension stretch with PT hand under heel -Pt received R knee flexion PROM seated at EOT.     PATIENT EDUCATION:  Education details: reviewed HEP;  swelling reduction; instructed pt in using ice.  Instructed pt in elevating R LE and performing ankle pumps to reduce swelling; safety with transfers; importance of improving extension  Person educated: Patient and Child(ren) Education method: Explanation, Demonstration, Tactile cues, and Verbal cues Education comprehension: verbalized understanding, returned demonstration, verbal cues required, tactile cues required, and needs further education     HOME EXERCISE PROGRAM: Access Code: [email or web address] URL: [email or web address] Date: 05/15/2021 Prepared by: Carolyne Littles   Exercises Supine Quad Set - 1 x daily - 7 x weekly - 2 sets - 10 reps - 3-5 hold Supine Heel Slide with Strap - 1 x daily - 7 x weekly - 2 reps - 15sec hold Supine Knee Extension Stretch on Towel Roll - 1 x daily - 7 x weekly - 3 sets - 3 reps - 15sec hold Seated Hamstring Stretch - 1 x  daily - 7 x weekly - 3 sets - 3 reps - 20 sec hold     ASSESSMENT:   CLINICAL IMPRESSION: Patients knee extension is improving. His extension was measured at -9 degrees. His flexion has improved as well from intial eval. He feels like he is moving better overall. He is stiff in the morning but his exercises help. He feels like he is doing better standing and walking. He also reports less spasm in his back. He tolerated there-ex today. Therapy also assessed gait. He has improved gait distance.    Objective impairments include Abnormal gait, decreased activity tolerance, decreased balance, decreased endurance, decreased knowledge of use of DME, difficulty walking, decreased ROM, decreased strength, increased fascial restrictions, impaired sensation, impaired UE functional use, improper body mechanics, and pain. These impairments are limiting patient from cleaning, community activity, driving, occupation, yard work, and shopping. Personal factors including 3+ comorbidities: DMII, multi joint replacement; chronic back  pain   are also affecting patient's functional outcome.    REHAB POTENTIAL: Good   CLINICAL DECISION MAKING: Evolving/moderate complexity signifcant mobility limitations    EVALUATION COMPLEXITY: Moderate     GOALS: Goals reviewed with patient? No   SHORT TERM GOALS:   STG Name Target Date Goal status  1 Patient will improve passive knee extension by 10 degrees  Baseline:  06/12/2021 12/29 Improved 9 degrees   2 Patient will ambulate 23' with supervision and walker  Baseline:  06/12/2021 12/29  Improved ability to ambulate but not quite 20'    3 Patient will transfer sit to stand with supervision  Baseline: 06/12/2021 Improved to CGA 12/29 Ongoing   LONG TERM GOALS:    LTG Name Target Date Goal status  1 Patient will go up and down 4 steps without pain in order to get in and out of his house  Baseline: 07/10/2021 INITIAL  2 Patient will ambulate 500' without increased pain  with LRAD in order to go to his MD appointments  Baseline: 07/10/2021 INITIAL  3 Patient will be independent with bed mobility  Baseline: 07/10/2021 INITIAL  PLAN: PT FREQUENCY: 2x/week   PT DURATION: 8 weeks   PLANNED INTERVENTIONS: Therapeutic exercises, Therapeutic activity, Neuro Muscular re-education, Balance training, Gait training, Patient/Family education, Joint mobilization, Stair training, DME instructions, Aquatic Therapy, Dry Needling, Electrical stimulation, Cryotherapy, Taping, Vasopneumatic device, Ultrasound, and Manual therapy   PLAN FOR NEXT SESSION: Cont per TKA protocol.  PROM with focus on extension; work on sit to stand transfer; general strengthening; consider manual therapy to his back; general TKE exercises depending on what positions he can tolerate.         Carney Living PT DPT  05/21/2021, 2:01 PM

## 2021-05-22 ENCOUNTER — Encounter (HOSPITAL_BASED_OUTPATIENT_CLINIC_OR_DEPARTMENT_OTHER): Payer: Self-pay | Admitting: Physical Therapy

## 2021-05-26 ENCOUNTER — Other Ambulatory Visit (HOSPITAL_COMMUNITY): Payer: Self-pay

## 2021-05-26 MED ORDER — OXYCODONE HCL 5 MG PO TABS
ORAL_TABLET | ORAL | 0 refills | Status: DC
  Filled 2021-05-26: qty 42, 7d supply, fill #0

## 2021-05-27 ENCOUNTER — Other Ambulatory Visit: Payer: Self-pay

## 2021-05-27 ENCOUNTER — Ambulatory Visit (HOSPITAL_BASED_OUTPATIENT_CLINIC_OR_DEPARTMENT_OTHER): Payer: 59 | Attending: Internal Medicine | Admitting: Physical Therapy

## 2021-05-27 DIAGNOSIS — M25561 Pain in right knee: Secondary | ICD-10-CM | POA: Insufficient documentation

## 2021-05-27 DIAGNOSIS — M25661 Stiffness of right knee, not elsewhere classified: Secondary | ICD-10-CM | POA: Diagnosis not present

## 2021-05-27 DIAGNOSIS — M545 Low back pain, unspecified: Secondary | ICD-10-CM | POA: Diagnosis not present

## 2021-05-27 DIAGNOSIS — R262 Difficulty in walking, not elsewhere classified: Secondary | ICD-10-CM | POA: Diagnosis not present

## 2021-05-27 DIAGNOSIS — M6281 Muscle weakness (generalized): Secondary | ICD-10-CM | POA: Insufficient documentation

## 2021-05-27 DIAGNOSIS — R2689 Other abnormalities of gait and mobility: Secondary | ICD-10-CM | POA: Insufficient documentation

## 2021-05-27 NOTE — Therapy (Signed)
OUTPATIENT PHYSICAL THERAPY TREATMENT NOTE   Patient Name: Ryan CLARIN, MD MRN: [id number] DOB:1950-08-03, 71 y.o., male Today's Date: 05/28/2021  PCP: Ginger Organ., MD REFERRING PROVIDER: Ginger Organ., MD   PT End of Session - 05/28/21 1926     Visit Number 4    Number of Visits 16    Date for PT Re-Evaluation 07/10/21    Authorization Type [employer]    PT Start Time 7681    PT Stop Time 1057    PT Time Calculation (min) 42 min    Activity Tolerance Patient tolerated treatment well    Behavior During Therapy [facility] for tasks assessed/performed             Past Medical History:  Diagnosis Date   Chronic combined systolic and diastolic CHF (congestive heart failure) ([place])    Coronary atherosclerosis of native coronary artery    Dental crowns present    Essential hypertension, benign    states under control with meds., has been on med. x 15 yr.   GERD (gastroesophageal reflux disease)    Hepatitis    Drug induced hepatitis   MI (myocardial infarction) ([place]) 10/27/2002   Non-insulin dependent type 2 diabetes mellitus ([place])    Obesity    Osteoarthritis    hips and knees   Pure hypercholesterolemia    Sleep apnea    Past Surgical History:  Procedure Laterality Date   ANTERIOR INTEROSSEOUS NERVE DECOMPRESSION Right 04/15/2021   Procedure: ANTERIOR INTEROSSEOUS NERVE DECOMPRESSION;  Surgeon: [name], MD;  Location: [place];  Service: Orthopedics;  Laterality: Right;  Only need 1 hour for case,  Axillary block/Mac   CARDIAC CATHETERIZATION  10/27/2002   CARPAL TUNNEL RELEASE Right 11/21/2013   CARPAL TUNNEL RELEASE Right 04/15/2021   Procedure: CARPAL TUNNEL RELEASE;  Surgeon: [name], MD;  Location: [employer];  Service: Orthopedics;  Laterality: Right;   CORONARY STENT PLACEMENT  10/27/2002   mid LAD   TOTAL HIP ARTHROPLASTY Left 04/09/1999   TOTAL HIP ARTHROPLASTY Right    TOTAL KNEE  ARTHROPLASTY Left 02/03/2011   TOTAL KNEE ARTHROPLASTY Right 05/11/2021   Procedure: TOTAL KNEE ARTHROPLASTY;  Surgeon: Gaynelle Arabian, MD;  Location: [facility];  Service: Orthopedics;  Laterality: Right;   ULNAR TUNNEL RELEASE Left 06/18/2016   Procedure: LEFT CUBITAL TUNNEL RELEASE;  Surgeon: [name], MD;  Location: [place];  Service: Orthopedics;  Laterality: Left;   Patient Active Problem List   Diagnosis Date Noted   OA (osteoarthritis) of knee 05/11/2021   Primary osteoarthritis of right knee 05/11/2021   Colon cancer screening 10/31/2020   Gastroesophageal reflux disease 10/31/2020   Morbid obesity ([place]) 10/31/2020   Personal history of colonic polyps 10/31/2020   Pain of left hip joint 12/13/2019   AVNRT (AV nodal re-entry tachycardia) ([place])    Withdrawal arrhythmia ([place])    Cubital tunnel syndrome, left 06/29/2016   CAD (coronary artery disease), native coronary artery 05/08/2014   Hyperlipidemia 03/21/2013    Class: Chronic   Old MI (myocardial infarction) 03/21/2013    Class: Chronic   DM (diabetes mellitus), type 2, uncontrolled 03/21/2013    Class: Chronic   Essential hypertension, benign 03/21/2013    Class: Chronic   Obesity (BMI 30-39.9) 03/21/2013   Chronic combined systolic and diastolic CHF (congestive heart failure) ([place])      REFERRING PROVIDER: Dr Larey Dresser       REFERRING DIAG: Right  TKA    THERAPY DIAG:  Right TKA    ONSET DATE: 03/11/2021   SUBJECTIVE:    SUBJECTIVE STATEMENT: Patient report he feels like he is getting stronger. He walked in from the parking garage . He reports his gait distance is improving.    PERTINENT HISTORY: Anterior interosseus decompression;  Left and right hip replacement  Left knee replacement Low Back Pain    PAIN:  PAIN:   Are you having pain? Yes NPRS scale: 4/10  Pain location: R knee Pain orientation: Left Lumbar  PAIN TYPE: aching/Spasming  Pain description: intermittent   Aggravating factors: standing and walking Relieving factors: spasms go away    PRECAUTIONS: Fall   WEIGHT BEARING RESTRICTIONS Yes WBAT      PLOF: Independent   PATIENT GOALS  Get back moving comfortably      OBJECTIVE:   right knee extension -6  degrees   Right knee flexion 95 degrees    PATIENT SURVEYS:  FOTO  32 % ability 61% expected in 16 visits      Observation:  Bilateral ankle swelling. swelling in distal R LE and ankle.       TODAY'S TREATMENT: 1/5 Nu-step 5 min L1 with cuing for motion. Min a to get on    Quad set 2x10  SAQ 2x10  Supine hip abduction 2x10    Standing weight shift2x10  Standing march with UE support 2x5    Manual therapy  Manual Therapy:  -Manual Knee extension stretch with PT hand under heel -Pt received R knee flexion PROM supine  Ice 10 min elevated to right knee    Reviewed pain level, response to prior Rx, and HEP compliance. Reviewed HEP.  12/29 Nu-step 5 min L1 with cuing for motion. Min a to get on    Quad set 2x10  SAQ 2x10  Supine hip abduction 2x10    Standing weight shift2x10  Standing march with UE support 2x5    Manual therapy  Manual Therapy:  -Manual Knee extension stretch with PT hand under heel -Pt received R knee flexion PROM supine   Reviewed pain level, response to prior Rx, and HEP compliance. Reviewed HEP.   Gait: 2x20' cuing to stand straight and shorten his stride. 2nd trial he had improved gait.  12/27   Therapeutic Exercise:  Reviewed pain level, response to prior Rx, and HEP compliance. Reviewed HEP. Supine Quad Set 10 reps - 3-5 hold Supine Heel Slide with Strap 10 reps with 5 sec hold SAQ 2x10 reps Supine hip abd/add 2x10 reps  Seated Hamstring Stretch 3 reps - 20 sec hold Ankle pumps x 20 reps -See below for pt education   Manual Therapy:  -Manual Knee extension stretch with PT hand under heel -Pt received R knee flexion PROM seated at EOT.     PATIENT EDUCATION:   Education details: reviewed HEP;  reviewed benefis of balance training  Person educated: Patient and Child(ren) Education method: Explanation, Demonstration, Tactile cues, and Verbal cues Education comprehension: verbalized understanding, returned demonstration, verbal cues required, tactile cues required, and needs further education     HOME EXERCISE PROGRAM: Access Code: [email or web address] URL: [email or web address] Date: 05/15/2021 Prepared by: Carolyne Littles   Exercises Supine Quad Set - 1 x daily - 7 x weekly - 2 sets - 10 reps - 3-5 hold Supine Heel Slide with Strap - 1 x daily - 7 x weekly - 2 reps - 15sec hold Supine Knee Extension Stretch on Towel Roll - 1 x daily -  7 x weekly - 3 sets - 3 reps - 15sec hold Seated Hamstring Stretch - 1 x daily - 7 x weekly - 3 sets - 3 reps - 20 sec hold     ASSESSMENT:   CLINICAL IMPRESSION: Patient is improving. His extension was measured at -6 degrees. His flexion has improved as well from intial eval. We continue to progress his ambualtion distance He would like to progress to a cane. He was advised to give it a little more time and work on his strength and his balance. His gait distance with the walker has improved. Therapy also added in balance exercises today. He require min a with balance with eyes closed.     Objective impairments include Abnormal gait, decreased activity tolerance, decreased balance, decreased endurance, decreased knowledge of use of DME, difficulty walking, decreased ROM, decreased strength, increased fascial restrictions, impaired sensation, impaired UE functional use, improper body mechanics, and pain. These impairments are limiting patient from cleaning, community activity, driving, occupation, yard work, and shopping. Personal factors including 3+ comorbidities: DMII, multi joint replacement; chronic back pain   are also affecting patient's functional outcome.    REHAB POTENTIAL: Good   CLINICAL DECISION  MAKING: Evolving/moderate complexity signifcant mobility limitations    EVALUATION COMPLEXITY: Moderate     GOALS: Goals reviewed with patient? No   SHORT TERM GOALS:   STG Name Target Date Goal status  1 Patient will improve passive knee extension by 10 degrees  Baseline:  06/12/2021 12/29 Improved 9 degrees   2 Patient will ambulate 76' with supervision and walker  Baseline:  06/12/2021 12/29  Improved ability to ambulate but not quite 20'    3 Patient will transfer sit to stand with supervision  Baseline: 06/12/2021 Improved to CGA 12/29 Ongoing   LONG TERM GOALS:    LTG Name Target Date Goal status  1 Patient will go up and down 4 steps without pain in order to get in and out of his house  Baseline: 07/10/2021 INITIAL  2 Patient will ambulate 500' without increased pain with LRAD in order to go to his MD appointments  Baseline: 07/10/2021 INITIAL  3 Patient will be independent with bed mobility  Baseline: 07/10/2021 INITIAL  PLAN: PT FREQUENCY: 2x/week   PT DURATION: 8 weeks   PLANNED INTERVENTIONS: Therapeutic exercises, Therapeutic activity, Neuro Muscular re-education, Balance training, Gait training, Patient/Family education, Joint mobilization, Stair training, DME instructions, Aquatic Therapy, Dry Needling, Electrical stimulation, Cryotherapy, Taping, Vasopneumatic device, Ultrasound, and Manual therapy   PLAN FOR NEXT SESSION: Cont per TKA protocol.  PROM with focus on extension; work on sit to stand transfer; general strengthening; consider manual therapy to his back; general TKE exercises depending on what positions he can tolerate.      Carney Living PT DPT  05/28/2021, 7:43 PM

## 2021-05-28 ENCOUNTER — Encounter (HOSPITAL_BASED_OUTPATIENT_CLINIC_OR_DEPARTMENT_OTHER): Payer: Self-pay | Admitting: Physical Therapy

## 2021-05-29 ENCOUNTER — Ambulatory Visit (HOSPITAL_BASED_OUTPATIENT_CLINIC_OR_DEPARTMENT_OTHER): Payer: 59 | Admitting: Physical Therapy

## 2021-05-29 ENCOUNTER — Encounter (HOSPITAL_BASED_OUTPATIENT_CLINIC_OR_DEPARTMENT_OTHER): Payer: Self-pay | Admitting: Physical Therapy

## 2021-05-29 ENCOUNTER — Other Ambulatory Visit: Payer: Self-pay

## 2021-05-29 DIAGNOSIS — M25561 Pain in right knee: Secondary | ICD-10-CM | POA: Diagnosis not present

## 2021-05-29 DIAGNOSIS — R2689 Other abnormalities of gait and mobility: Secondary | ICD-10-CM

## 2021-05-29 DIAGNOSIS — R262 Difficulty in walking, not elsewhere classified: Secondary | ICD-10-CM | POA: Diagnosis not present

## 2021-05-29 DIAGNOSIS — M25661 Stiffness of right knee, not elsewhere classified: Secondary | ICD-10-CM | POA: Diagnosis not present

## 2021-05-29 DIAGNOSIS — M6281 Muscle weakness (generalized): Secondary | ICD-10-CM | POA: Diagnosis not present

## 2021-05-29 DIAGNOSIS — M545 Low back pain, unspecified: Secondary | ICD-10-CM | POA: Diagnosis not present

## 2021-05-29 NOTE — Therapy (Signed)
OUTPATIENT PHYSICAL THERAPY TREATMENT NOTE   Patient Name: Ryan NEESON, MD MRN: [id number] DOB:Apr 03, 1951, 71 y.o., male Today's Date: 05/29/2021  PCP: Ginger Organ., MD REFERRING PROVIDER: Ginger Organ., MD   PT End of Session - 05/29/21 1338     Visit Number 5    Number of Visits 16    Date for PT Re-Evaluation 07/10/21    Authorization Type [employer]    PT Start Time 8119    PT Stop Time 1059    PT Time Calculation (min) 44 min    Equipment Utilized During Treatment Gait belt    Activity Tolerance Patient tolerated treatment well    Behavior During Therapy WFL for tasks assessed/performed              Past Medical History:  Diagnosis Date   Chronic combined systolic and diastolic CHF (congestive heart failure) ([place])    Coronary atherosclerosis of native coronary artery    Dental crowns present    Essential hypertension, benign    states under control with meds., has been on med. x 15 yr.   GERD (gastroesophageal reflux disease)    Hepatitis    Drug induced hepatitis   MI (myocardial infarction) ([place]) 10/27/2002   Non-insulin dependent type 2 diabetes mellitus ([place])    Obesity    Osteoarthritis    hips and knees   Pure hypercholesterolemia    Sleep apnea    Past Surgical History:  Procedure Laterality Date   ANTERIOR INTEROSSEOUS NERVE DECOMPRESSION Right 04/15/2021   Procedure: ANTERIOR INTEROSSEOUS NERVE DECOMPRESSION;  Surgeon: [name], MD;  Location: [place];  Service: Orthopedics;  Laterality: Right;  Only need 1 hour for case,  Axillary block/Mac   CARDIAC CATHETERIZATION  10/27/2002   CARPAL TUNNEL RELEASE Right 11/21/2013   CARPAL TUNNEL RELEASE Right 04/15/2021   Procedure: CARPAL TUNNEL RELEASE;  Surgeon: [name], MD;  Location: [place];  Service: Orthopedics;  Laterality: Right;   CORONARY STENT PLACEMENT  10/27/2002   mid LAD   TOTAL HIP ARTHROPLASTY Left  04/09/1999   TOTAL HIP ARTHROPLASTY Right    TOTAL KNEE ARTHROPLASTY Left 02/03/2011   TOTAL KNEE ARTHROPLASTY Right 05/11/2021   Procedure: TOTAL KNEE ARTHROPLASTY;  Surgeon: Gaynelle Arabian, MD;  Location: [facility];  Service: Orthopedics;  Laterality: Right;   ULNAR TUNNEL RELEASE Left 06/18/2016   Procedure: LEFT CUBITAL TUNNEL RELEASE;  Surgeon: [name], MD;  Location: [place];  Service: Orthopedics;  Laterality: Left;   Patient Active Problem List   Diagnosis Date Noted   OA (osteoarthritis) of knee 05/11/2021   Primary osteoarthritis of right knee 05/11/2021   Colon cancer screening 10/31/2020   Gastroesophageal reflux disease 10/31/2020   Morbid obesity ([place]) 10/31/2020   Personal history of colonic polyps 10/31/2020   Pain of left hip joint 12/13/2019   AVNRT (AV nodal re-entry tachycardia) ([place])    Withdrawal arrhythmia ([place])    Cubital tunnel syndrome, left 06/29/2016   CAD (coronary artery disease), native coronary artery 05/08/2014   Hyperlipidemia 03/21/2013    Class: Chronic   Old MI (myocardial infarction) 03/21/2013    Class: Chronic   DM (diabetes mellitus), type 2, uncontrolled 03/21/2013    Class: Chronic   Essential hypertension, benign 03/21/2013    Class: Chronic   Obesity (BMI 30-39.9) 03/21/2013   Chronic combined systolic and diastolic CHF (congestive heart failure) ([place])      REFERRING PROVIDER: Dr Pilar Plate  [place]       REFERRING DIAG: Right TKA    THERAPY DIAG:  Right TKA    ONSET DATE: 03/11/2021   SUBJECTIVE:    SUBJECTIVE STATEMENT: Patient reports no significant pain after the last visit. He is doing well with his home exercises. He would like to start cane training. He hopes to use a cane when he returns to work.    PERTINENT HISTORY: Anterior interosseus decompression;  Left and right hip replacement  Left knee replacement Low Back Pain    PAIN:  PAIN:   Are you having pain? Yes NPRS scale: 2/10  Pain  location: R knee Pain orientation:right knee  PAIN TYPE: aching/Spasming  Pain description: intermittent  Aggravating factors: standing and walking Relieving factors: spasms go away    PRECAUTIONS: Fall   WEIGHT BEARING RESTRICTIONS Yes WBAT      PLOF: Independent   PATIENT GOALS  Get back moving comfortably      OBJECTIVE:   right knee extension -6  degrees   Right knee flexion 100 degrees 1/6   PATIENT SURVEYS:  FOTO  32 % ability 61% expected in 16 visits      Observation:  Bilateral ankle swelling. swelling in distal R LE and ankle.       TODAY'S TREATMENT: 1/5 Nu-step 5 min L1 with cuing for motion. Min a to get on   SAQ 3x10  Supine hip abduction 2x10    Standing weight shift2x10  Standing march with UE support 2x5    Manual therapy  Manual Therapy:  -Manual Knee extension stretch with PT hand under heel -Pt received R knee flexion PROM supine  Gait: therapy began cane training today. He required mod cuing but by his second trail he required very little cuing. Therapy reviewed proper set up of the cane with the patient and his daughter. They were advised to practivce at home but not to transition fully until he was steady and comfortable.   Ice 10 min elevated to right knee    Reviewed pain level, response to prior Rx, and HEP compliance. Reviewed HEP.  12/29 Nu-step 5 min L1 with cuing for motion. Min a to get on    Quad set 2x10  SAQ 2x10  Supine hip abduction 2x10    Standing weight shift2x10  Standing march with UE support 2x5    Manual therapy  Manual Therapy:  -Manual Knee extension stretch with PT hand under heel -Pt received R knee flexion PROM supine   Reviewed pain level, response to prior Rx, and HEP compliance. Reviewed HEP.   Gait: 2x20' cuing to stand straight and shorten his stride. 2nd trial he had improved gait.  12/27   Therapeutic Exercise:  Reviewed pain level, response to prior Rx, and HEP compliance. Reviewed  HEP. Supine Quad Set 10 reps - 3-5 hold Supine Heel Slide with Strap 10 reps with 5 sec hold SAQ 2x10 reps Supine hip abd/add 2x10 reps  Seated Hamstring Stretch 3 reps - 20 sec hold Ankle pumps x 20 reps -See below for pt education   Manual Therapy:  -Manual Knee extension stretch with PT hand under heel -Pt received R knee flexion PROM seated at EOT.     PATIENT EDUCATION:  Education details: reviewed HEP;  reviewed benefis of balance training  Person educated: Patient and Child(ren) Education method: Explanation, Demonstration, Tactile cues, and Verbal cues Education comprehension: verbalized understanding, returned demonstration, verbal cues required, tactile cues required, and needs further education  HOME EXERCISE PROGRAM: Access Code: [email or web address] URL: [email or web address] Date: 05/15/2021 Prepared by: Carolyne Littles   Exercises Supine Quad Set - 1 x daily - 7 x weekly - 2 sets - 10 reps - 3-5 hold Supine Heel Slide with Strap - 1 x daily - 7 x weekly - 2 reps - 15sec hold Supine Knee Extension Stretch on Towel Roll - 1 x daily - 7 x weekly - 3 sets - 3 reps - 15sec hold Seated Hamstring Stretch - 1 x daily - 7 x weekly - 3 sets - 3 reps - 20 sec hold     ASSESSMENT:   CLINICAL IMPRESSION: Total arc today measured at 6-100 degrees. He did well with his cane training. Heis technique improved as he ambulated. He still has a quad lag with SLR bu that is improving as well. We will continue to progress strengthening as tolerated. We will progress to stair training soon,.   Objective impairments include Abnormal gait, decreased activity tolerance, decreased balance, decreased endurance, decreased knowledge of use of DME, difficulty walking, decreased ROM, decreased strength, increased fascial restrictions, impaired sensation, impaired UE functional use, improper body mechanics, and pain. These impairments are limiting patient from cleaning, community activity,  driving, occupation, yard work, and shopping. Personal factors including 3+ comorbidities: DMII, multi joint replacement; chronic back pain   are also affecting patient's functional outcome.    REHAB POTENTIAL: Good   CLINICAL DECISION MAKING: Evolving/moderate complexity signifcant mobility limitations    EVALUATION COMPLEXITY: Moderate     GOALS: Goals reviewed with patient? No   SHORT TERM GOALS:   STG Name Target Date Goal status  1 Patient will improve passive knee extension by 10 degrees  Baseline:  06/12/2021 12/29 Improved 9 degrees   2 Patient will ambulate 56' with supervision and walker  Baseline:  06/12/2021 12/29  Improved ability to ambulate but not quite 20'    3 Patient will transfer sit to stand with supervision  Baseline: 06/12/2021 Improved to CGA 12/29 Ongoing   LONG TERM GOALS:    LTG Name Target Date Goal status  1 Patient will go up and down 4 steps without pain in order to get in and out of his house  Baseline: 07/10/2021 INITIAL  2 Patient will ambulate 500' without increased pain with LRAD in order to go to his MD appointments  Baseline: 07/10/2021 INITIAL  3 Patient will be independent with bed mobility  Baseline: 07/10/2021 INITIAL  PLAN: PT FREQUENCY: 2x/week   PT DURATION: 8 weeks   PLANNED INTERVENTIONS: Therapeutic exercises, Therapeutic activity, Neuro Muscular re-education, Balance training, Gait training, Patient/Family education, Joint mobilization, Stair training, DME instructions, Aquatic Therapy, Dry Needling, Electrical stimulation, Cryotherapy, Taping, Vasopneumatic device, Ultrasound, and Manual therapy   PLAN FOR NEXT SESSION: Cont per TKA protocol.  PROM with focus on extension; work on sit to stand transfer; general strengthening; consider manual therapy to his back; general TKE exercises depending on what positions he can tolerate.      Carney Living PT DPT  05/29/2021, 1:40 PM

## 2021-06-03 ENCOUNTER — Encounter (HOSPITAL_BASED_OUTPATIENT_CLINIC_OR_DEPARTMENT_OTHER): Payer: Self-pay | Admitting: Physical Therapy

## 2021-06-03 ENCOUNTER — Other Ambulatory Visit: Payer: Self-pay

## 2021-06-03 ENCOUNTER — Ambulatory Visit (HOSPITAL_BASED_OUTPATIENT_CLINIC_OR_DEPARTMENT_OTHER): Payer: 59 | Admitting: Physical Therapy

## 2021-06-03 DIAGNOSIS — Z96651 Presence of right artificial knee joint: Secondary | ICD-10-CM | POA: Diagnosis not present

## 2021-06-03 DIAGNOSIS — M25561 Pain in right knee: Secondary | ICD-10-CM | POA: Diagnosis not present

## 2021-06-03 DIAGNOSIS — R2689 Other abnormalities of gait and mobility: Secondary | ICD-10-CM

## 2021-06-03 DIAGNOSIS — M545 Low back pain, unspecified: Secondary | ICD-10-CM | POA: Diagnosis not present

## 2021-06-03 DIAGNOSIS — M6281 Muscle weakness (generalized): Secondary | ICD-10-CM | POA: Diagnosis not present

## 2021-06-03 DIAGNOSIS — M25661 Stiffness of right knee, not elsewhere classified: Secondary | ICD-10-CM | POA: Diagnosis not present

## 2021-06-03 DIAGNOSIS — R262 Difficulty in walking, not elsewhere classified: Secondary | ICD-10-CM | POA: Diagnosis not present

## 2021-06-03 NOTE — Therapy (Signed)
OUTPATIENT PHYSICAL THERAPY TREATMENT NOTE   Patient Name: Ryan QUE, MD MRN: [id number] DOB:12/22/1950, 71 y.o., male Today's Date: 06/03/2021  PCP: Ginger Organ., MD REFERRING PROVIDER: Ginger Organ., MD   PT End of Session - 06/03/21 1306     Visit Number 6    Number of Visits 16    Date for PT Re-Evaluation 07/10/21    Authorization Type [employer]    PT Start Time 1300    Activity Tolerance Patient tolerated treatment well    Behavior During Therapy [facility] for tasks assessed/performed              Past Medical History:  Diagnosis Date   Chronic combined systolic and diastolic CHF (congestive heart failure) (HCC)    Coronary atherosclerosis of native coronary artery    Dental crowns present    Essential hypertension, benign    states under control with meds., has been on med. x 15 yr.   GERD (gastroesophageal reflux disease)    Hepatitis    Drug induced hepatitis   MI (myocardial infarction) ([place]) 10/27/2002   Non-insulin dependent type 2 diabetes mellitus ([place])    Obesity    Osteoarthritis    hips and knees   Pure hypercholesterolemia    Sleep apnea    Past Surgical History:  Procedure Laterality Date   ANTERIOR INTEROSSEOUS NERVE DECOMPRESSION Right 04/15/2021   Procedure: ANTERIOR INTEROSSEOUS NERVE DECOMPRESSION;  Surgeon: [name], MD;  Location: [place];  Service: Orthopedics;  Laterality: Right;  Only need 1 hour for case,  Axillary block/Mac   CARDIAC CATHETERIZATION  10/27/2002   CARPAL TUNNEL RELEASE Right 11/21/2013   CARPAL TUNNEL RELEASE Right 04/15/2021   Procedure: CARPAL TUNNEL RELEASE;  Surgeon: [name], MD;  Location: [place];  Service: Orthopedics;  Laterality: Right;   CORONARY STENT PLACEMENT  10/27/2002   mid LAD   TOTAL HIP ARTHROPLASTY Left 04/09/1999   TOTAL HIP ARTHROPLASTY Right    TOTAL KNEE ARTHROPLASTY Left 02/03/2011   TOTAL KNEE ARTHROPLASTY  Right 05/11/2021   Procedure: TOTAL KNEE ARTHROPLASTY;  Surgeon: Gaynelle Arabian, MD;  Location: [facility];  Service: Orthopedics;  Laterality: Right;   ULNAR TUNNEL RELEASE Left 06/18/2016   Procedure: LEFT CUBITAL TUNNEL RELEASE;  Surgeon: [name], MD;  Location: [place];  Service: Orthopedics;  Laterality: Left;   Patient Active Problem List   Diagnosis Date Noted   OA (osteoarthritis) of knee 05/11/2021   Primary osteoarthritis of right knee 05/11/2021   Colon cancer screening 10/31/2020   Gastroesophageal reflux disease 10/31/2020   Morbid obesity ([place]) 10/31/2020   Personal history of colonic polyps 10/31/2020   Pain of left hip joint 12/13/2019   AVNRT (AV nodal re-entry tachycardia) ([place])    Withdrawal arrhythmia (HCC)    Cubital tunnel syndrome, left 06/29/2016   CAD (coronary artery disease), native coronary artery 05/08/2014   Hyperlipidemia 03/21/2013    Class: Chronic   Old MI (myocardial infarction) 03/21/2013    Class: Chronic   DM (diabetes mellitus), type 2, uncontrolled 03/21/2013    Class: Chronic   Essential hypertension, benign 03/21/2013    Class: Chronic   Obesity (BMI 30-39.9) 03/21/2013   Chronic combined systolic and diastolic CHF (congestive heart failure) ([place])      REFERRING PROVIDER: Dr Larey Dresser       REFERRING DIAG: Right TKA    THERAPY DIAG:  Right TKA    ONSET DATE: 03/11/2021  SUBJECTIVE:    SUBJECTIVE STATEMENT: Patient reports his knee is doing pretty well. He has purchased a cane. He iwas having about a 1-2/10 pain. He had some trouble sleeping last night. He feels like the swelling played a role.  PERTINENT HISTORY: Anterior interosseus decompression;  Left and right hip replacement  Left knee replacement Low Back Pain    PAIN:  PAIN:   Are you having pain? Yes NPRS scale: 2/10 1/11 Pain location: R knee Pain orientation:right knee  PAIN TYPE: aching/Spasming  Pain description: intermittent   Aggravating factors: standing and walking Relieving factors: spasms go away    PRECAUTIONS: Fall   WEIGHT BEARING RESTRICTIONS Yes WBAT      PLOF: Independent   PATIENT GOALS  Get back moving comfortably      OBJECTIVE:   right knee extension -6  degrees   Right knee flexion 6-110 degrees 1/11   PATIENT SURVEYS:  FOTO  32 % ability 61% expected in 16 visits      Observation:  Bilateral ankle swelling. swelling in distal R LE and ankle.       TODAY'S TREATMENT: 1/11 SAQ 3x10 SLR 3x10   Standing heel raise 2x10  Standing march 2x10   Step up 2x10 2 inch step    Manual therapy  Manual Therapy:  -Manual Knee extension stretch with PT hand under heel - Grade II and III PA and AP mobilizations for flexion and extension  Gait training with cane: good technique with minimal cuing 100'x2. Patient will be going back to work. He was advised to use his walker until he is confident with longer distance on the walker.   1/5 Nu-step 5 min L1 with cuing for motion. Min a to get on   SAQ 3x10  Supine hip abduction 2x10    Standing weight shift2x10  Standing march with UE support 2x5    Manual therapy  Manual Therapy:  -Manual Knee extension stretch with PT hand under heel -Pt received R knee flexion PROM supine  Gait: therapy began cane training today. He required mod cuing but by his second trail he required very little cuing. Therapy reviewed proper set up of the cane with the patient and his daughter. They were advised to practivce at home but not to transition fully until he was steady and comfortable.   Ice 10 min elevated to right knee    Reviewed pain level, response to prior Rx, and HEP compliance. Reviewed HEP.  12/29 Nu-step 5 min L1 with cuing for motion. Min a to get on    Quad set 2x10  SAQ 2x10  Supine hip abduction 2x10    Standing weight shift2x10  Standing march with UE support 2x5    Manual therapy  Manual Therapy:  -Manual Knee  extension stretch with PT hand under heel -Pt received R knee flexion PROM supine   Reviewed pain level, response to prior Rx, and HEP compliance. Reviewed HEP.   Gait: 2x20' cuing to stand straight and shorten his stride. 2nd trial he had improved gait.  12/27   Therapeutic Exercise:  Reviewed pain level, response to prior Rx, and HEP compliance. Reviewed HEP. Supine Quad Set 10 reps - 3-5 hold Supine Heel Slide with Strap 10 reps with 5 sec hold SAQ 2x10 reps Supine hip abd/add 2x10 reps  Seated Hamstring Stretch 3 reps - 20 sec hold Ankle pumps x 20 reps -See below for pt education   Manual Therapy:  -Manual Knee extension stretch with PT  hand under heel -Pt received R knee flexion PROM seated at EOT.     PATIENT EDUCATION:  Education details: reviewed HEP;  reviewed benefis of balance training  Person educated: Patient and Child(ren) Education method: Explanation, Demonstration, Tactile cues, and Verbal cues Education comprehension: verbalized understanding, returned demonstration, verbal cues required, tactile cues required, and needs further education     HOME EXERCISE PROGRAM: Access Code: [email or web address] URL: [email or web address] Date: 05/15/2021 Prepared by: Carolyne Littles   Exercises Supine Quad Set - 1 x daily - 7 x weekly - 2 sets - 10 reps - 3-5 hold Supine Heel Slide with Strap - 1 x daily - 7 x weekly - 2 reps - 15sec hold Supine Knee Extension Stretch on Towel Roll - 1 x daily - 7 x weekly - 3 sets - 3 reps - 15sec hold Seated Hamstring Stretch - 1 x daily - 7 x weekly - 3 sets - 3 reps - 20 sec hold     ASSESSMENT:   CLINICAL IMPRESSION: Total arc today measured at 6-100 degrees. He did well with his cane training. Heis technique improved as he ambulated. He still has a quad lag with SLR bu that is improving as well. We will continue to progress strengthening as tolerated. We will progress to stair training soon,.   Objective impairments  include Abnormal gait, decreased activity tolerance, decreased balance, decreased endurance, decreased knowledge of use of DME, difficulty walking, decreased ROM, decreased strength, increased fascial restrictions, impaired sensation, impaired UE functional use, improper body mechanics, and pain. These impairments are limiting patient from cleaning, community activity, driving, occupation, yard work, and shopping. Personal factors including 3+ comorbidities: DMII, multi joint replacement; chronic back pain   are also affecting patient's functional outcome.    REHAB POTENTIAL: Good   CLINICAL DECISION MAKING: Evolving/moderate complexity signifcant mobility limitations    EVALUATION COMPLEXITY: Moderate     GOALS: Goals reviewed with patient? No   SHORT TERM GOALS:   STG Name Target Date Goal status  1 Patient will improve passive knee extension by 10 degrees  Baseline:  06/12/2021 12/29 Improved 9 degrees   2 Patient will ambulate 86' with supervision and walker  Baseline:  06/12/2021 12/29  Improved ability to ambulate but not quite 20'    3 Patient will transfer sit to stand with supervision  Baseline: 06/12/2021 Improved to CGA 12/29 Ongoing   LONG TERM GOALS:    LTG Name Target Date Goal status  1 Patient will go up and down 4 steps without pain in order to get in and out of his house  Baseline: 07/10/2021 INITIAL  2 Patient will ambulate 500' without increased pain with LRAD in order to go to his MD appointments  Baseline: 07/10/2021 INITIAL  3 Patient will be independent with bed mobility  Baseline: 07/10/2021 INITIAL  PLAN: PT FREQUENCY: 2x/week   PT DURATION: 8 weeks   PLANNED INTERVENTIONS: Therapeutic exercises, Therapeutic activity, Neuro Muscular re-education, Balance training, Gait training, Patient/Family education, Joint mobilization, Stair training, DME instructions, Aquatic Therapy, Dry Needling, Electrical stimulation, Cryotherapy, Taping, Vasopneumatic device,  Ultrasound, and Manual therapy   PLAN FOR NEXT SESSION: Cont per TKA protocol.  PROM with focus on extension; work on sit to stand transfer; general strengthening; consider manual therapy to his back; general TKE exercises depending on what positions he can tolerate.      Carney Living PT DPT  06/03/2021, 1:26 PM

## 2021-06-04 ENCOUNTER — Encounter (HOSPITAL_BASED_OUTPATIENT_CLINIC_OR_DEPARTMENT_OTHER): Payer: Self-pay | Admitting: Physical Therapy

## 2021-06-05 ENCOUNTER — Encounter (HOSPITAL_BASED_OUTPATIENT_CLINIC_OR_DEPARTMENT_OTHER): Payer: Self-pay | Admitting: Physical Therapy

## 2021-06-05 ENCOUNTER — Other Ambulatory Visit: Payer: Self-pay

## 2021-06-05 ENCOUNTER — Ambulatory Visit (HOSPITAL_BASED_OUTPATIENT_CLINIC_OR_DEPARTMENT_OTHER): Payer: 59 | Admitting: Physical Therapy

## 2021-06-05 DIAGNOSIS — M545 Low back pain, unspecified: Secondary | ICD-10-CM | POA: Diagnosis not present

## 2021-06-05 DIAGNOSIS — M25661 Stiffness of right knee, not elsewhere classified: Secondary | ICD-10-CM | POA: Diagnosis not present

## 2021-06-05 DIAGNOSIS — M25561 Pain in right knee: Secondary | ICD-10-CM

## 2021-06-05 DIAGNOSIS — R2689 Other abnormalities of gait and mobility: Secondary | ICD-10-CM | POA: Diagnosis not present

## 2021-06-05 DIAGNOSIS — R262 Difficulty in walking, not elsewhere classified: Secondary | ICD-10-CM | POA: Diagnosis not present

## 2021-06-05 DIAGNOSIS — M6281 Muscle weakness (generalized): Secondary | ICD-10-CM | POA: Diagnosis not present

## 2021-06-05 NOTE — Therapy (Signed)
OUTPATIENT PHYSICAL THERAPY TREATMENT NOTE   Patient Name: Ryan KENT, MD MRN: [id number] DOB:08/31/1950, 71 y.o., male Today's Date: 06/05/2021  PCP: Ginger Organ., MD REFERRING PROVIDER: Ginger Organ., MD   PT End of Session - 06/05/21 1319     Visit Number 7    Number of Visits 16    Date for PT Re-Evaluation 07/10/21    Authorization Type [employer]    PT Start Time 0100    PT Stop Time 0142    PT Time Calculation (min) 42 min    Activity Tolerance Patient tolerated treatment well    Behavior During Therapy [facility] for tasks assessed/performed              Past Medical History:  Diagnosis Date   Chronic combined systolic and diastolic CHF (congestive heart failure) ([place])    Coronary atherosclerosis of native coronary artery    Dental crowns present    Essential hypertension, benign    states under control with meds., has been on med. x 15 yr.   GERD (gastroesophageal reflux disease)    Hepatitis    Drug induced hepatitis   MI (myocardial infarction) ([place]) 10/27/2002   Non-insulin dependent type 2 diabetes mellitus ([place])    Obesity    Osteoarthritis    hips and knees   Pure hypercholesterolemia    Sleep apnea    Past Surgical History:  Procedure Laterality Date   ANTERIOR INTEROSSEOUS NERVE DECOMPRESSION Right 04/15/2021   Procedure: ANTERIOR INTEROSSEOUS NERVE DECOMPRESSION;  Surgeon: [name], MD;  Location: [place];  Service: Orthopedics;  Laterality: Right;  Only need 1 hour for case,  Axillary block/Mac   CARDIAC CATHETERIZATION  10/27/2002   CARPAL TUNNEL RELEASE Right 11/21/2013   CARPAL TUNNEL RELEASE Right 04/15/2021   Procedure: CARPAL TUNNEL RELEASE;  Surgeon: [name], MD;  Location: [place];  Service: Orthopedics;  Laterality: Right;   CORONARY STENT PLACEMENT  10/27/2002   mid LAD   TOTAL HIP ARTHROPLASTY Left 04/09/1999   TOTAL HIP ARTHROPLASTY Right    TOTAL KNEE  ARTHROPLASTY Left 02/03/2011   TOTAL KNEE ARTHROPLASTY Right 05/11/2021   Procedure: TOTAL KNEE ARTHROPLASTY;  Surgeon: Gaynelle Arabian, MD;  Location: [facility];  Service: Orthopedics;  Laterality: Right;   ULNAR TUNNEL RELEASE Left 06/18/2016   Procedure: LEFT CUBITAL TUNNEL RELEASE;  Surgeon: [name], MD;  Location: [place];  Service: Orthopedics;  Laterality: Left;   Patient Active Problem List   Diagnosis Date Noted   OA (osteoarthritis) of knee 05/11/2021   Primary osteoarthritis of right knee 05/11/2021   Colon cancer screening 10/31/2020   Gastroesophageal reflux disease 10/31/2020   Morbid obesity ([place]) 10/31/2020   Personal history of colonic polyps 10/31/2020   Pain of left hip joint 12/13/2019   AVNRT (AV nodal re-entry tachycardia) ([place])    Withdrawal arrhythmia (HCC)    Cubital tunnel syndrome, left 06/29/2016   CAD (coronary artery disease), native coronary artery 05/08/2014   Hyperlipidemia 03/21/2013    Class: Chronic   Old MI (myocardial infarction) 03/21/2013    Class: Chronic   DM (diabetes mellitus), type 2, uncontrolled 03/21/2013    Class: Chronic   Essential hypertension, benign 03/21/2013    Class: Chronic   Obesity (BMI 30-39.9) 03/21/2013   Chronic combined systolic and diastolic CHF (congestive heart failure) ([place])      REFERRING PROVIDER: Dr Larey Dresser       REFERRING DIAG:  Right TKA    THERAPY DIAG:  Right TKA    ONSET DATE: 03/11/2021   SUBJECTIVE:    SUBJECTIVE STATEMENT: Patient reports his knee is doing pretty well. He has purchased a cane. He iwas having about a 1-2/10 pain. He had some trouble sleeping last night. He feels like the swelling played a role.  PERTINENT HISTORY: Anterior interosseus decompression;  Left and right hip replacement  Left knee replacement Low Back Pain    PAIN:  PAIN:   Are you having pain? Yes NPRS scale: 2/10 1/11 Pain location: R knee Pain orientation:right knee  PAIN  TYPE: aching/Spasming  Pain description: intermittent  Aggravating factors: standing and walking Relieving factors: spasms go away    PRECAUTIONS: Fall   WEIGHT BEARING RESTRICTIONS Yes WBAT      PLOF: Independent   PATIENT GOALS  Get back moving comfortably      OBJECTIVE:   right knee extension -3  degrees   Right knee flexion 6-110 degrees 1/11   PATIENT SURVEYS:  FOTO  32 % ability 61% expected in 16 visits      Observation:  Bilateral ankle swelling. swelling in distal R LE and ankle.       TODAY'S TREATMENT: 06/05/2021 Quad set x10 5 sec hold  SAQ 3x10  SLR 3x10   LAQ 3x10   Standing heel raise 2x10  Standing march 2x10   Step up 2x10 2 inch step   TKE 3x10 red with table support   Manual therapy  Manual Therapy:  -Manual Knee extension stretch with PT hand under heel - Grade II and III PA and AP mobilizations for flexion and extension 1/11 SAQ 3x10 SLR 3x10   Standing heel raise 2x10  Standing march 2x10   Step up 2x10 2 inch step    Manual therapy  Manual Therapy:  -Manual Knee extension stretch with PT hand under heel - Grade II and III PA and AP mobilizations for flexion and extension  Gait training with cane: good technique with minimal cuing 100'x2. Patient will be going back to work. He was advised to use his walker until he is confident with longer distance on the walker.   1/5 Nu-step 5 min L1 with cuing for motion. Min a to get on   SAQ 3x10  Supine hip abduction 2x10    Standing weight shift2x10  Standing march with UE support 2x5    Manual therapy  Manual Therapy:  -Manual Knee extension stretch with PT hand under heel -Pt received R knee flexion PROM supine  Gait: therapy began cane training today. He required mod cuing but by his second trail he required very little cuing. Therapy reviewed proper set up of the cane with the patient and his daughter. They were advised to practivce at home but not to transition fully  until he was steady and comfortable.   Ice 10 min elevated to right knee    Reviewed pain level, response to prior Rx, and HEP compliance. Reviewed HEP.    PATIENT EDUCATION:  Education details: reviewed HEP;  reviewed benefis of balance training  Person educated: Patient and Child(ren) Education method: Explanation, Demonstration, Tactile cues, and Verbal cues Education comprehension: verbalized understanding, returned demonstration, verbal cues required, tactile cues required, and needs further education     HOME EXERCISE PROGRAM: Access Code: [email or web address] URL: [email or web address] Date: 05/15/2021 Prepared by: Carolyne Littles   Exercises Supine Quad Set - 1 x daily - 7 x weekly - 2 sets - 10 reps -  3-5 hold Supine Heel Slide with Strap - 1 x daily - 7 x weekly - 2 reps - 15sec hold Supine Knee Extension Stretch on Towel Roll - 1 x daily - 7 x weekly - 3 sets - 3 reps - 15sec hold Seated Hamstring Stretch - 1 x daily - 7 x weekly - 3 sets - 3 reps - 20 sec hold     ASSESSMENT:   CLINICAL IMPRESSION: Patient continues to progress well. Total arc measured at 3-110 today with minimal pain. He is using the cane at home for the most part. He will return to work next week. Therapy added LAQ to his home program. We added TKE to his program at here. He had not increase in pain. Therapy will continue to progress as tolerated.  Objective impairments include Abnormal gait, decreased activity tolerance, decreased balance, decreased endurance, decreased knowledge of use of DME, difficulty walking, decreased ROM, decreased strength, increased fascial restrictions, impaired sensation, impaired UE functional use, improper body mechanics, and pain. These impairments are limiting patient from cleaning, community activity, driving, occupation, yard work, and shopping. Personal factors including 3+ comorbidities: DMII, multi joint replacement; chronic back pain   are also affecting patient's  functional outcome.    REHAB POTENTIAL: Good   CLINICAL DECISION MAKING: Evolving/moderate complexity signifcant mobility limitations    EVALUATION COMPLEXITY: Moderate     GOALS: Goals reviewed with patient? No   SHORT TERM GOALS:   STG Name Target Date Goal status  1 Patient will improve passive knee extension by 10 degrees  Baseline:  06/12/2021 12/29 Improved 9 degrees   2 Patient will ambulate 30' with supervision and walker  Baseline:  06/12/2021 12/29  Improved ability to ambulate but not quite 20'    3 Patient will transfer sit to stand with supervision  Baseline: 06/12/2021 Improved to CGA 12/29 Ongoing   LONG TERM GOALS:    LTG Name Target Date Goal status  1 Patient will go up and down 4 steps without pain in order to get in and out of his house  Baseline: 07/10/2021 INITIAL  2 Patient will ambulate 500' without increased pain with LRAD in order to go to his MD appointments  Baseline: 07/10/2021 INITIAL  3 Patient will be independent with bed mobility  Baseline: 07/10/2021 INITIAL  PLAN: PT FREQUENCY: 2x/week   PT DURATION: 8 weeks   PLANNED INTERVENTIONS: Therapeutic exercises, Therapeutic activity, Neuro Muscular re-education, Balance training, Gait training, Patient/Family education, Joint mobilization, Stair training, DME instructions, Aquatic Therapy, Dry Needling, Electrical stimulation, Cryotherapy, Taping, Vasopneumatic device, Ultrasound, and Manual therapy   PLAN FOR NEXT SESSION: Cont per TKA protocol.  PROM with focus on extension; work on sit to stand transfer; general strengthening; consider manual therapy to his back; general TKE exercises depending on what positions he can tolerate.      Carney Living PT DPT  06/05/2021, 4:40 PM

## 2021-06-08 ENCOUNTER — Encounter (HOSPITAL_BASED_OUTPATIENT_CLINIC_OR_DEPARTMENT_OTHER): Payer: Self-pay | Admitting: Physical Therapy

## 2021-06-08 ENCOUNTER — Other Ambulatory Visit: Payer: Self-pay

## 2021-06-08 ENCOUNTER — Ambulatory Visit (HOSPITAL_BASED_OUTPATIENT_CLINIC_OR_DEPARTMENT_OTHER): Payer: 59 | Admitting: Physical Therapy

## 2021-06-08 DIAGNOSIS — M25661 Stiffness of right knee, not elsewhere classified: Secondary | ICD-10-CM | POA: Diagnosis not present

## 2021-06-08 DIAGNOSIS — R262 Difficulty in walking, not elsewhere classified: Secondary | ICD-10-CM | POA: Diagnosis not present

## 2021-06-08 DIAGNOSIS — M25561 Pain in right knee: Secondary | ICD-10-CM

## 2021-06-08 DIAGNOSIS — R2689 Other abnormalities of gait and mobility: Secondary | ICD-10-CM | POA: Diagnosis not present

## 2021-06-08 DIAGNOSIS — M6281 Muscle weakness (generalized): Secondary | ICD-10-CM | POA: Diagnosis not present

## 2021-06-08 DIAGNOSIS — M545 Low back pain, unspecified: Secondary | ICD-10-CM | POA: Diagnosis not present

## 2021-06-08 NOTE — Therapy (Signed)
OUTPATIENT PHYSICAL THERAPY TREATMENT NOTE   Patient Name: Ryan MARTINE, MD MRN: [id number] DOB:02-07-1951, 71 y.o., male Today's Date: 06/08/2021  PCP: Ginger Organ., MD    PT End of Session - 06/08/21 1538     Visit Number 8    Number of Visits 16    Date for PT Re-Evaluation 07/10/21    Authorization Type [employer]    PT Start Time 6503    PT Stop Time 1534    PT Time Calculation (min) 50 min    Activity Tolerance Patient tolerated treatment well    Behavior During Therapy [facility] for tasks assessed/performed               Past Medical History:  Diagnosis Date   Chronic combined systolic and diastolic CHF (congestive heart failure) ([place])    Coronary atherosclerosis of native coronary artery    Dental crowns present    Essential hypertension, benign    states under control with meds., has been on med. x 15 yr.   GERD (gastroesophageal reflux disease)    Hepatitis    Drug induced hepatitis   MI (myocardial infarction) ([place]) 10/27/2002   Non-insulin dependent type 2 diabetes mellitus ([place])    Obesity    Osteoarthritis    hips and knees   Pure hypercholesterolemia    Sleep apnea    Past Surgical History:  Procedure Laterality Date   ANTERIOR INTEROSSEOUS NERVE DECOMPRESSION Right 04/15/2021   Procedure: ANTERIOR INTEROSSEOUS NERVE DECOMPRESSION;  Surgeon: [name], MD;  Location: [place];  Service: Orthopedics;  Laterality: Right;  Only need 1 hour for case,  Axillary block/Mac   CARDIAC CATHETERIZATION  10/27/2002   CARPAL TUNNEL RELEASE Right 11/21/2013   CARPAL TUNNEL RELEASE Right 04/15/2021   Procedure: CARPAL TUNNEL RELEASE;  Surgeon: [name], MD;  Location: [place];  Service: Orthopedics;  Laterality: Right;   CORONARY STENT PLACEMENT  10/27/2002   mid LAD   TOTAL HIP ARTHROPLASTY Left 04/09/1999   TOTAL HIP ARTHROPLASTY Right    TOTAL KNEE ARTHROPLASTY Left 02/03/2011   TOTAL  KNEE ARTHROPLASTY Right 05/11/2021   Procedure: TOTAL KNEE ARTHROPLASTY;  Surgeon: Gaynelle Arabian, MD;  [facility]: [facility];  Service: Orthopedics;  Laterality: Right;   ULNAR TUNNEL RELEASE Left 06/18/2016   Procedure: LEFT CUBITAL TUNNEL RELEASE;  Surgeon: [name], MD;  Location: [place];  Service: Orthopedics;  Laterality: Left;   Patient Active Problem List   Diagnosis Date Noted   OA (osteoarthritis) of knee 05/11/2021   Primary osteoarthritis of right knee 05/11/2021   Colon cancer screening 10/31/2020   Gastroesophageal reflux disease 10/31/2020   Morbid obesity ([place]) 10/31/2020   Personal history of colonic polyps 10/31/2020   Pain of left hip joint 12/13/2019   AVNRT (AV nodal re-entry tachycardia) ([place])    Withdrawal arrhythmia ([place])    Cubital tunnel syndrome, left 06/29/2016   CAD (coronary artery disease), native coronary artery 05/08/2014   Hyperlipidemia 03/21/2013    Class: Chronic   Old MI (myocardial infarction) 03/21/2013    Class: Chronic   DM (diabetes mellitus), type 2, uncontrolled 03/21/2013    Class: Chronic   Essential hypertension, benign 03/21/2013    Class: Chronic   Obesity (BMI 30-39.9) 03/21/2013   Chronic combined systolic and diastolic CHF (congestive heart failure) ([place])      REFERRING PROVIDER: Dr Larey Dresser       REFERRING DIAG: Right TKA  THERAPY DIAG:  Right TKA    ONSET DATE: Surgery 05/11/2021   SUBJECTIVE:    SUBJECTIVE STATEMENT: Pt denies any adverse effects after prior Rx, just soreness.  Pt states he has been using the cane more.  Pt does still use the walker some.  Pt reports he returns to work tomorrow.    PERTINENT HISTORY: Anterior interosseus decompression;  Left and right hip replacement  Left knee replacement Low Back Pain    PAIN:  PAIN:   Are you having pain? Yes NPRS scale: 1-2/10  Pain location: R knee Pain orientation:right knee  PAIN TYPE: aching/Spasming  Pain description:  intermittent  Aggravating factors: standing and walking Relieving factors: spasms go away    PRECAUTIONS: Fall   WEIGHT BEARING RESTRICTIONS Yes WBAT      PLOF: Independent   PATIENT GOALS  Get back moving comfortably      OBJECTIVE:      TODAY'S TREATMENT: 06/08/2021  Therapeutic Exercise: Pt performed:  Nu-step x 5 mins with UEs/LEs  Supine static extension stretch approx 1.5 mins  Quad set x10 with 5 sec hold with heel propped SAQ 2x10  SLR 2x10  Supine gastroc stretch with strap 2x20-30 sec LAQ 3x10  Standing heel raise 2x10  Step up 2x10 2 inch step  TKE 2x10 red with table support      Manual Therapy:   -Manual Knee extension stretch with PT hand under heel - Grade II and III PA and AP mobilizations for flexion and extension and 4D patellar mobs - Pt received R knee flexion and extension PROM in supine and knee flexion PROM seated at EOT  Modalities: Ice 10 min elevated to right knee in supine    PATIENT EDUCATION:  Education details:  Exercise form  Person educated: Patient  Education method: Explanation, Demonstration, Tactile cues, and Verbal cues Education comprehension: verbalized understanding, returned demonstration, verbal cues required, tactile cues required, and needs further education     HOME EXERCISE PROGRAM: Access Code: [email or web address] URL: [email or web address] Date: 05/15/2021 Prepared by: Carolyne Littles   Exercises Supine Quad Set - 1 x daily - 7 x weekly - 2 sets - 10 reps - 3-5 hold Supine Heel Slide with Strap - 1 x daily - 7 x weekly - 2 reps - 15sec hold Supine Knee Extension Stretch on Towel Roll - 1 x daily - 7 x weekly - 3 sets - 3 reps - 15sec hold Seated Hamstring Stretch - 1 x daily - 7 x weekly - 3 sets - 3 reps - 20 sec hold     ASSESSMENT:   CLINICAL IMPRESSION: Patient continues to progress well.  He is improving with flexion and extension ROM and continues to have limitations and tightness in extension.   Pt tolerated MT well and did have discomfort/pain with knee extension stretching.  Pt is improving with gait and using the cane more.  Pt performed ther ex per protocol well with cuing and instruction in correct form.  He should continue to benefit from cont skilled PT services per protocol to address ongoing goals and to assist in restoring desired level of function.    Objective impairments include Abnormal gait, decreased activity tolerance, decreased balance, decreased endurance, decreased knowledge of use of DME, difficulty walking, decreased ROM, decreased strength, increased fascial restrictions, impaired sensation, impaired UE functional use, improper body mechanics, and pain. These impairments are limiting patient from cleaning, community activity, driving, occupation, yard work, and shopping. Personal factors including 3+ comorbidities: DMII, multi joint  replacement; chronic back pain   are also affecting patient's functional outcome.    REHAB POTENTIAL: Good   CLINICAL DECISION MAKING: Evolving/moderate complexity signifcant mobility limitations    EVALUATION COMPLEXITY: Moderate     GOALS: Goals reviewed with patient? No   SHORT TERM GOALS:   STG Name Target Date Goal status  1 Patient will improve passive knee extension by 10 degrees  Baseline:  06/12/2021 12/29 Improved 9 degrees   2 Patient will ambulate 27' with supervision and walker  Baseline:  06/12/2021 12/29  Improved ability to ambulate but not quite 20'    3 Patient will transfer sit to stand with supervision  Baseline: 06/12/2021 Improved to CGA 12/29 Ongoing   LONG TERM GOALS:    LTG Name Target Date Goal status  1 Patient will go up and down 4 steps without pain in order to get in and out of his house  Baseline: 07/10/2021 INITIAL  2 Patient will ambulate 500' without increased pain with LRAD in order to go to his MD appointments  Baseline: 07/10/2021 INITIAL  3 Patient will be independent with bed mobility   Baseline: 07/10/2021 INITIAL  PLAN: PT FREQUENCY: 2x/week   PT DURATION: 8 weeks   PLANNED INTERVENTIONS: Therapeutic exercises, Therapeutic activity, Neuro Muscular re-education, Balance training, Gait training, Patient/Family education, Joint mobilization, Stair training, DME instructions, Aquatic Therapy, Dry Needling, Electrical stimulation, Cryotherapy, Taping, Vasopneumatic device, Ultrasound, and Manual therapy   PLAN FOR NEXT SESSION: Cont per TKA protocol.  PROM with focus on extension; work on sit to stand transfer; general strengthening; consider manual therapy to his back; general TKE exercises depending on what positions he can tolerate.      Selinda Michaels III PT, DPT 06/08/21 11:31 PM

## 2021-06-10 ENCOUNTER — Encounter (HOSPITAL_BASED_OUTPATIENT_CLINIC_OR_DEPARTMENT_OTHER): Payer: Self-pay | Admitting: Physical Therapy

## 2021-06-12 ENCOUNTER — Encounter (HOSPITAL_BASED_OUTPATIENT_CLINIC_OR_DEPARTMENT_OTHER): Payer: Self-pay | Admitting: Physical Therapy

## 2021-06-12 ENCOUNTER — Other Ambulatory Visit: Payer: Self-pay

## 2021-06-12 ENCOUNTER — Ambulatory Visit (HOSPITAL_BASED_OUTPATIENT_CLINIC_OR_DEPARTMENT_OTHER): Payer: 59 | Admitting: Physical Therapy

## 2021-06-12 DIAGNOSIS — R2689 Other abnormalities of gait and mobility: Secondary | ICD-10-CM

## 2021-06-12 DIAGNOSIS — M6281 Muscle weakness (generalized): Secondary | ICD-10-CM | POA: Diagnosis not present

## 2021-06-12 DIAGNOSIS — M25561 Pain in right knee: Secondary | ICD-10-CM

## 2021-06-12 DIAGNOSIS — M25661 Stiffness of right knee, not elsewhere classified: Secondary | ICD-10-CM

## 2021-06-12 DIAGNOSIS — R262 Difficulty in walking, not elsewhere classified: Secondary | ICD-10-CM

## 2021-06-12 DIAGNOSIS — M545 Low back pain, unspecified: Secondary | ICD-10-CM | POA: Diagnosis not present

## 2021-06-12 NOTE — Therapy (Signed)
OUTPATIENT PHYSICAL THERAPY TREATMENT NOTE   Patient Name: Ryan BRUNTZ, MD MRN: [id number] DOB:1950-12-18, 71 y.o., male Today's Date: 06/12/2021  PCP: Ginger Organ., MD    PT End of Session - 06/12/21 1341     Visit Number 9    Number of Visits 16    Date for PT Re-Evaluation 07/10/21    Authorization Type [employer]    PT Start Time 1250    PT Stop Time 1340    PT Time Calculation (min) 50 min    Activity Tolerance Patient tolerated treatment well    Behavior During Therapy [facility] for tasks assessed/performed                Past Medical History:  Diagnosis Date   Chronic combined systolic and diastolic CHF (congestive heart failure) ([place])    Coronary atherosclerosis of native coronary artery    Dental crowns present    Essential hypertension, benign    states under control with meds., has been on med. x 15 yr.   GERD (gastroesophageal reflux disease)    Hepatitis    Drug induced hepatitis   MI (myocardial infarction) ([place]) 10/27/2002   Non-insulin dependent type 2 diabetes mellitus ([place])    Obesity    Osteoarthritis    hips and knees   Pure hypercholesterolemia    Sleep apnea    Past Surgical History:  Procedure Laterality Date   ANTERIOR INTEROSSEOUS NERVE DECOMPRESSION Right 04/15/2021   Procedure: ANTERIOR INTEROSSEOUS NERVE DECOMPRESSION;  Surgeon: [name], MD;  Location: [place];  Service: Orthopedics;  Laterality: Right;  Only need 1 hour for case,  Axillary block/Mac   CARDIAC CATHETERIZATION  10/27/2002   CARPAL TUNNEL RELEASE Right 11/21/2013   CARPAL TUNNEL RELEASE Right 04/15/2021   Procedure: CARPAL TUNNEL RELEASE;  Surgeon: [name], MD;  Location: [place];  Service: Orthopedics;  Laterality: Right;   CORONARY STENT PLACEMENT  10/27/2002   mid LAD   TOTAL HIP ARTHROPLASTY Left 04/09/1999   TOTAL HIP ARTHROPLASTY Right    TOTAL KNEE ARTHROPLASTY Left 02/03/2011   TOTAL  KNEE ARTHROPLASTY Right 05/11/2021   Procedure: TOTAL KNEE ARTHROPLASTY;  Surgeon: Gaynelle Arabian, MD;  [facility];  Service: Orthopedics;  Laterality: Right;   ULNAR TUNNEL RELEASE Left 06/18/2016   Procedure: LEFT CUBITAL TUNNEL RELEASE;  Surgeon: [name], MD;  Location: [place];  Service: Orthopedics;  Laterality: Left;   Patient Active Problem List   Diagnosis Date Noted   OA (osteoarthritis) of knee 05/11/2021   Primary osteoarthritis of right knee 05/11/2021   Colon cancer screening 10/31/2020   Gastroesophageal reflux disease 10/31/2020   Morbid obesity ([place]) 10/31/2020   Personal history of colonic polyps 10/31/2020   Pain of left hip joint 12/13/2019   AVNRT (AV nodal re-entry tachycardia) ([place])    Withdrawal arrhythmia ([place])    Cubital tunnel syndrome, left 06/29/2016   CAD (coronary artery disease), native coronary artery 05/08/2014   Hyperlipidemia 03/21/2013    Class: Chronic   Old MI (myocardial infarction) 03/21/2013    Class: Chronic   DM (diabetes mellitus), type 2, uncontrolled 03/21/2013    Class: Chronic   Essential hypertension, benign 03/21/2013    Class: Chronic   Obesity (BMI 30-39.9) 03/21/2013   Chronic combined systolic and diastolic CHF (congestive heart failure) ([place])      REFERRING PROVIDER: Dr Larey Dresser       REFERRING DIAG: Right TKA  THERAPY DIAG:  Right TKA    ONSET DATE: Surgery 05/11/2021   SUBJECTIVE:    SUBJECTIVE STATEMENT: Pt denies any adverse effects after prior Rx, just soreness.  Pt states he has been using the cane more.  Pt does still use the walker some.  Pt reports he returns to work tomorrow.    PERTINENT HISTORY: Anterior interosseus decompression;  Left and right hip replacement  Left knee replacement Low Back Pain    PAIN:  PAIN:   Are you having pain? Yes NPRS scale: 1-2/10  Pain location: R knee Pain orientation:right knee  PAIN TYPE: aching/Spasming  Pain description:  intermittent  Aggravating factors: standing and walking Relieving factors: spasms go away    PRECAUTIONS: Fall   WEIGHT BEARING RESTRICTIONS Yes WBAT      PLOF: Independent   PATIENT GOALS  Get back moving comfortably      OBJECTIVE:   -8 extension last visit was -6    TODAY'S TREATMENT: 1/20 Nu-step x 5 mins with UEs/LEs  Supine static extension stretch approx 1.5 mins  Quad set x10 with 5 sec hold with heel propped SAQ 2x10 2lb  SLR 1x10 1x20  Supine gastroc stretch with strap 2x20-30 sec LAQ 3x10 2lbs  Standing heel raise 2x10  Step up 2x10 2 inch step  TKE 2x10 red with table support      Manual Therapy:   -Manual Knee extension stretch with PT hand under heel - Grade II and III PA and AP mobilizations for flexion and extension and 4D patellar mobs - Pt received R knee flexion and extension PROM in supine and knee flexion PROM seated at EOT  06/08/2021  Therapeutic Exercise: Pt performed:  Nu-step x 5 mins with UEs/LEs  Supine static extension stretch approx 1.5 mins  Quad set x10 with 5 sec hold with heel propped SAQ 2x10  SLR 2x10  Supine gastroc stretch with strap 2x20-30 sec LAQ 3x10  Standing heel raise 2x10  Step up 2x10 2 inch step  TKE 2x10 red with table support      Manual Therapy:   -Manual Knee extension stretch with PT hand under heel - Grade II and III PA and AP mobilizations for flexion and extension and 4D patellar mobs - Pt received R knee flexion and extension PROM in supine and knee flexion PROM seated at EOT  Modalities: Ice 10 min elevated to right knee in supine    PATIENT EDUCATION:  Education details:  Exercise form  Person educated: Patient  Education method: Explanation, Demonstration, Tactile cues, and Verbal cues Education comprehension: verbalized understanding, returned demonstration, verbal cues required, tactile cues required, and needs further education     HOME EXERCISE PROGRAM: Access Code: [email or web address] URL:  [email or web address] Date: 05/15/2021 Prepared by: Carolyne Littles   Exercises Supine Quad Set - 1 x daily - 7 x weekly - 2 sets - 10 reps - 3-5 hold Supine Heel Slide with Strap - 1 x daily - 7 x weekly - 2 reps - 15sec hold Supine Knee Extension Stretch on Towel Roll - 1 x daily - 7 x weekly - 3 sets - 3 reps - 15sec hold Seated Hamstring Stretch - 1 x daily - 7 x weekly - 3 sets - 3 reps - 20 sec hold     ASSESSMENT:   CLINICAL IMPRESSION: Patient has returned to work. His pain has stayed well controlled but he has lst a minor amount of extension. He was advised to continue to work on his  extension at home. Overall he is progressing well. We were able to advance his strengthening exercises today with weights. We also advanced his stair. We will continue to progress as tolerated.    Objective impairments include Abnormal gait, decreased activity tolerance, decreased balance, decreased endurance, decreased knowledge of use of DME, difficulty walking, decreased ROM, decreased strength, increased fascial restrictions, impaired sensation, impaired UE functional use, improper body mechanics, and pain. These impairments are limiting patient from cleaning, community activity, driving, occupation, yard work, and shopping. Personal factors including 3+ comorbidities: DMII, multi joint replacement; chronic back pain   are also affecting patient's functional outcome.    REHAB POTENTIAL: Good   CLINICAL DECISION MAKING: Evolving/moderate complexity signifcant mobility limitations    EVALUATION COMPLEXITY: Moderate     GOALS: Goals reviewed with patient? No   SHORT TERM GOALS:   STG Name Target Date Goal status  1 Patient will improve passive knee extension by 10 degrees  Baseline:  06/12/2021 12/29 Improved 9 degrees   2 Patient will ambulate 20' with supervision and walker  Baseline:  06/12/2021 1/20  Patient has progressed off the walker to a cane    3 Patient will transfer  sit to stand with supervision  Baseline: 06/12/2021 Improved to CGA 12/29 Ongoing   LONG TERM GOALS:    LTG Name Target Date Goal status  1 Patient will go up and down 4 steps without pain in order to get in and out of his house  Baseline: 07/10/2021 INITIAL  2 Patient will ambulate 500' without increased pain with LRAD in order to go to his MD appointments  Baseline: 07/10/2021 INITIAL  3 Patient will be independent with bed mobility  Baseline: 07/10/2021 INITIAL  PLAN: PT FREQUENCY: 2x/week   PT DURATION: 8 weeks   PLANNED INTERVENTIONS: Therapeutic exercises, Therapeutic activity, Neuro Muscular re-education, Balance training, Gait training, Patient/Family education, Joint mobilization, Stair training, DME instructions, Aquatic Therapy, Dry Needling, Electrical stimulation, Cryotherapy, Taping, Vasopneumatic device, Ultrasound, and Manual therapy   PLAN FOR NEXT SESSION: Cont per TKA protocol.  PROM with focus on extension; work on sit to stand transfer; general strengthening; consider manual therapy to his back; general TKE exercises depending on what positions he can tolerate.      Burnis Medin PT DPT  06/12/21 1:50 PM

## 2021-06-15 ENCOUNTER — Other Ambulatory Visit: Payer: Self-pay

## 2021-06-15 ENCOUNTER — Ambulatory Visit (HOSPITAL_BASED_OUTPATIENT_CLINIC_OR_DEPARTMENT_OTHER): Payer: 59 | Admitting: Physical Therapy

## 2021-06-15 ENCOUNTER — Encounter (HOSPITAL_BASED_OUTPATIENT_CLINIC_OR_DEPARTMENT_OTHER): Payer: Self-pay | Admitting: Physical Therapy

## 2021-06-15 DIAGNOSIS — R2689 Other abnormalities of gait and mobility: Secondary | ICD-10-CM

## 2021-06-15 DIAGNOSIS — R262 Difficulty in walking, not elsewhere classified: Secondary | ICD-10-CM

## 2021-06-15 DIAGNOSIS — M25661 Stiffness of right knee, not elsewhere classified: Secondary | ICD-10-CM | POA: Diagnosis not present

## 2021-06-15 DIAGNOSIS — M545 Low back pain, unspecified: Secondary | ICD-10-CM

## 2021-06-15 DIAGNOSIS — M6281 Muscle weakness (generalized): Secondary | ICD-10-CM | POA: Diagnosis not present

## 2021-06-15 DIAGNOSIS — M25561 Pain in right knee: Secondary | ICD-10-CM | POA: Diagnosis not present

## 2021-06-15 NOTE — Therapy (Signed)
OUTPATIENT PHYSICAL THERAPY TREATMENT NOTE   Patient Name: Ryan KROPP, MD MRN: [id number] DOB:10/12/1950, 71 y.o., male Today's Date: 06/15/2021  PCP: Ginger Organ., MD    PT End of Session - 06/15/21 1320     Visit Number 10    Number of Visits 16    Date for PT Re-Evaluation 07/10/21    Authorization Type [employer]    PT Start Time 1300    PT Stop Time 1342    PT Time Calculation (min) 42 min    Activity Tolerance Patient tolerated treatment well    Behavior During Therapy [facility] for tasks assessed/performed            Progress Note Reporting Period 05/15/2022 to 06/15/2021  See note below for Objective Data and Assessment of Progress/Goals.        Past Medical History:  Diagnosis Date   Chronic combined systolic and diastolic CHF (congestive heart failure) (HCC)    Coronary atherosclerosis of native coronary artery    Dental crowns present    Essential hypertension, benign    states under control with meds., has been on med. x 15 yr.   GERD (gastroesophageal reflux disease)    Hepatitis    Drug induced hepatitis   MI (myocardial infarction) ([place]) 10/27/2002   Non-insulin dependent type 2 diabetes mellitus ([place])    Obesity    Osteoarthritis    hips and knees   Pure hypercholesterolemia    Sleep apnea    Past Surgical History:  Procedure Laterality Date   ANTERIOR INTEROSSEOUS NERVE DECOMPRESSION Right 04/15/2021   Procedure: ANTERIOR INTEROSSEOUS NERVE DECOMPRESSION;  Surgeon: [name], MD;  Location: [place];  Service: Orthopedics;  Laterality: Right;  Only need 1 hour for case,  Axillary block/Mac   CARDIAC CATHETERIZATION  10/27/2002   CARPAL TUNNEL RELEASE Right 11/21/2013   CARPAL TUNNEL RELEASE Right 04/15/2021   Procedure: CARPAL TUNNEL RELEASE;  Surgeon: [name], MD;  Location: [place];  Service: Orthopedics;  Laterality: Right;   CORONARY STENT PLACEMENT  10/27/2002   mid  LAD   TOTAL HIP ARTHROPLASTY Left 04/09/1999   TOTAL HIP ARTHROPLASTY Right    TOTAL KNEE ARTHROPLASTY Left 02/03/2011   TOTAL KNEE ARTHROPLASTY Right 05/11/2021   Procedure: TOTAL KNEE ARTHROPLASTY;  Surgeon: Gaynelle Arabian, MD;  [facility];  Service: Orthopedics;  Laterality: Right;   ULNAR TUNNEL RELEASE Left 06/18/2016   Procedure: LEFT CUBITAL TUNNEL RELEASE;  Surgeon: [name], MD;  Location: [place];  Service: Orthopedics;  Laterality: Left;   Patient Active Problem List   Diagnosis Date Noted   OA (osteoarthritis) of knee 05/11/2021   Primary osteoarthritis of right knee 05/11/2021   Colon cancer screening 10/31/2020   Gastroesophageal reflux disease 10/31/2020   Morbid obesity ([place]) 10/31/2020   Personal history of colonic polyps 10/31/2020   Pain of left hip joint 12/13/2019   AVNRT (AV nodal re-entry tachycardia) ([place])    Withdrawal arrhythmia ([place])    Cubital tunnel syndrome, left 06/29/2016   CAD (coronary artery disease), native coronary artery 05/08/2014   Hyperlipidemia 03/21/2013    Class: Chronic   Old MI (myocardial infarction) 03/21/2013    Class: Chronic   DM (diabetes mellitus), type 2, uncontrolled 03/21/2013    Class: Chronic   Essential hypertension, benign 03/21/2013    Class: Chronic   Obesity (BMI 30-39.9) 03/21/2013   Chronic combined systolic and diastolic CHF (congestive heart failure) ([place])  REFERRING PROVIDER: Dr Larey Dresser       REFERRING DIAG: Right TKA    THERAPY DIAG:  Right TKA    ONSET DATE: Surgery 05/11/2021   SUBJECTIVE:    SUBJECTIVE STATEMENT: Patient has been working on his extension over the weekend. Overall he is doing well. He has been working. He continues to use the cane.   PERTINENT HISTORY: Anterior interosseus decompression;  Left and right hip replacement  Left knee replacement Low Back Pain    PAIN:  PAIN:   Are you having pain? Yes NPRS scale: 1-2/10  Pain location:  R knee Pain orientation:right knee  PAIN TYPE: aching/Spasming  Pain description: intermittent  Aggravating factors: standing and walking Relieving factors: spasms go away    PRECAUTIONS: Fall   WEIGHT BEARING RESTRICTIONS Yes WBAT      PLOF: Independent   PATIENT GOALS  Get back moving comfortably      OBJECTIVE:   -8 extension last visit was -6    LE AROM/PROM:   PROM Right 05/15/2021 Left 05/15/2021  Hip flexion      Hip extension      Hip abduction      Hip adduction      Hip internal rotation      Hip external rotation      Knee flexion  108    Knee extension  -4    Ankle dorsiflexion      Ankle plantarflexion      Ankle inversion      Ankle eversion       (Blank rows = not tested)   LE MMT:   MMT Right 05/15/2021 Left 05/15/2021  Hip flexion 32.1 25.4  Hip extension      Hip abduction 36.5 38.5  Hip adduction      Hip internal rotation      Hip external rotation      Knee flexion    Knee extension 38.8 43.2  Ankle dorsiflexion      Ankle plantarflexion      Ankle inversion      Ankle eversion       (Blank rows = not tested)   FUNCTIONAL TESTS:  Min a sit to stand transfer; Mod a transfer from supine to sit GAIT:   Ambualting with a cane with minimal support. Decreased single leg stance time on the right with mild lateral movement.    TODAY'S TREATMENT: 1/23 Reviewed testing results and goals  Nu-step x 5 mins with UEs/LEs  SLR 1 lb x30  LAQ x30 1 llb  SAQ x30 2lbs  Step up 4 inch 2x10  Lateral step up 2x10        Manual Therapy:   -Manual Knee extension stretch with PT hand under heel - Grade II and III PA and AP mobilizations for flexion and extension and 4D patellar mobs - Pt received R knee flexion and extension PROM in supine and knee flexion PROM seated at EOT  1/20 Nu-step x 5 mins with UEs/LEs  Supine static extension stretch approx 1.5 mins  Quad set x10 with 5 sec hold with heel propped SAQ 2x10 2lb  SLR 1x10  1x20  Supine gastroc stretch with strap 2x20-30 sec LAQ 3x10 2lbs  Standing heel raise 2x10  Step up 2x10 2 inch step  TKE 2x10 red with table support      Manual Therapy:   -Manual Knee extension stretch with PT hand under heel - Grade II and III PA and AP mobilizations  for flexion and extension and 4D patellar mobs - Pt received R knee flexion and extension PROM in supine and knee flexion PROM seated at EOT     PATIENT EDUCATION:  Education details:  reviewed POC , golas, test and measures, POC going forward   Person educated: Patient  Education method: Explanation, Demonstration, Tactile cues, and Verbal cues Education comprehension: verbalized understanding, returned demonstration, verbal cues required, tactile cues required, and needs further education     HOME EXERCISE PROGRAM: Access Code: [email or web address] URL: [email or web address] Date: 05/15/2021 Prepared by: Carolyne Littles   Exercises Supine Quad Set - 1 x daily - 7 x weekly - 2 sets - 10 reps - 3-5 hold Supine Heel Slide with Strap - 1 x daily - 7 x weekly - 2 reps - 15sec hold Supine Knee Extension Stretch on Towel Roll - 1 x daily - 7 x weekly - 3 sets - 3 reps - 15sec hold Seated Hamstring Stretch - 1 x daily - 7 x weekly - 3 sets - 3 reps - 20 sec hold     ASSESSMENT:   CLINICAL IMPRESSION: The patient continues to make great progress. His extension has begun progressing again. He is still 4 degrees from straight, but he was 8 degrees last week. His strength left to right is progressing well. We were able to test him with the hand dyno today. His knee flexion range continues to progress as well. We continue to focus on stair training and stability exercises in standing> he would benefit from further therapy 2W6 but will likely not need therapy that long as long as he continues to progress.   Objective impairments include Abnormal gait, decreased activity tolerance, decreased balance, decreased endurance,  decreased knowledge of use of DME, difficulty walking, decreased ROM, decreased strength, increased fascial restrictions, impaired sensation, impaired UE functional use, improper body mechanics, and pain. These impairments are limiting patient from cleaning, community activity, driving, occupation, yard work, and shopping. Personal factors including 3+ comorbidities: DMII, multi joint replacement; chronic back pain   are also affecting patient's functional outcome.    REHAB POTENTIAL: Good   CLINICAL DECISION MAKING: Evolving/moderate complexity signifcant mobility limitations    EVALUATION COMPLEXITY: Moderate     GOALS: Goals reviewed with patient? No   SHORT TERM GOALS:   STG Name Target Date Goal status  1 Patient will improve passive knee extension by 10 degrees  Baseline:  06/12/2021 1/23 -4 ongoing   2 Patient will ambulate 20' with supervision and walker  Baseline:  06/12/2021 1/20  Patient has progressed off the walker to a cane Achived     3 Patient will transfer sit to stand with supervision  Baseline: 06/12/2021 Improved to CGA 12/29 No assist  Achieved   LONG TERM GOALS:    LTG Name Target Date Goal status 1/23  1 Patient will go up and down 4 steps without pain in order to get in and out of his house  Baseline: 07/10/2021 Has progressed to a 4 inc step   2 Patient will ambulate 500' without increased pain with LRAD in order to go to his MD appointments  Baseline: 07/10/2021 INITIAL Ambulating with LRAD at this time  3 Patient will be independent with bed mobility  Baseline: 07/10/2021 Independent witout back pain/ achieved   PLAN: PT FREQUENCY: 2x/week   PT DURATION: 8 weeks   PLANNED INTERVENTIONS: Therapeutic exercises, Therapeutic activity, Neuro Muscular re-education, Balance training, Gait training, Patient/Family education, Joint mobilization, Stair training, DME instructions, Aquatic  Therapy, Dry Needling, Electrical stimulation, Cryotherapy, Taping,  Vasopneumatic device, Ultrasound, and Manual therapy   PLAN FOR NEXT SESSION: Continue to progress stair training and functional mobility training.   Burnis Medin PT DPT  06/15/21 1:31 PM

## 2021-06-16 ENCOUNTER — Ambulatory Visit (HOSPITAL_BASED_OUTPATIENT_CLINIC_OR_DEPARTMENT_OTHER): Payer: 59 | Admitting: Physical Therapy

## 2021-06-16 DIAGNOSIS — Z471 Aftercare following joint replacement surgery: Secondary | ICD-10-CM | POA: Diagnosis not present

## 2021-06-16 DIAGNOSIS — Z96642 Presence of left artificial hip joint: Secondary | ICD-10-CM | POA: Diagnosis not present

## 2021-06-17 ENCOUNTER — Encounter (HOSPITAL_BASED_OUTPATIENT_CLINIC_OR_DEPARTMENT_OTHER): Payer: Self-pay | Admitting: Physical Therapy

## 2021-06-19 ENCOUNTER — Other Ambulatory Visit: Payer: Self-pay

## 2021-06-19 ENCOUNTER — Ambulatory Visit (HOSPITAL_BASED_OUTPATIENT_CLINIC_OR_DEPARTMENT_OTHER): Payer: 59 | Admitting: Physical Therapy

## 2021-06-19 DIAGNOSIS — M25661 Stiffness of right knee, not elsewhere classified: Secondary | ICD-10-CM

## 2021-06-19 DIAGNOSIS — M6281 Muscle weakness (generalized): Secondary | ICD-10-CM | POA: Diagnosis not present

## 2021-06-19 DIAGNOSIS — M25561 Pain in right knee: Secondary | ICD-10-CM | POA: Diagnosis not present

## 2021-06-19 DIAGNOSIS — M545 Low back pain, unspecified: Secondary | ICD-10-CM | POA: Diagnosis not present

## 2021-06-19 DIAGNOSIS — R2689 Other abnormalities of gait and mobility: Secondary | ICD-10-CM

## 2021-06-19 DIAGNOSIS — R262 Difficulty in walking, not elsewhere classified: Secondary | ICD-10-CM | POA: Diagnosis not present

## 2021-06-19 NOTE — Therapy (Signed)
OUTPATIENT PHYSICAL THERAPY TREATMENT NOTE   Patient Name: Ryan ARTOLA, MD MRN: [id number] DOB:[birth year], 71 y.o., male Today's Date: 06/20/2021  PCP: Ginger Organ., MD    PT End of Session - 06/20/21 0755     Visit Number 11    Number of Visits 16    Date for PT Re-Evaluation 07/10/21    Authorization Type [employer]    PT Start Time 0347    PT Stop Time 1404    PT Time Calculation (min) 40 min    Activity Tolerance Patient tolerated treatment well    Behavior During Therapy [facility] for tasks assessed/performed                  Past Medical History:  Diagnosis Date   Chronic combined systolic and diastolic CHF (congestive heart failure) ([place])    Coronary atherosclerosis of native coronary artery    Dental crowns present    Essential hypertension, benign    states under control with meds., has been on med. x 15 yr.   GERD (gastroesophageal reflux disease)    Hepatitis    Drug induced hepatitis   MI (myocardial infarction) ([place]) 10/27/2002   Non-insulin dependent type 2 diabetes mellitus ([place])    Obesity    Osteoarthritis    hips and knees   Pure hypercholesterolemia    Sleep apnea    Past Surgical History:  Procedure Laterality Date   ANTERIOR INTEROSSEOUS NERVE DECOMPRESSION Right 04/15/2021   Procedure: ANTERIOR INTEROSSEOUS NERVE DECOMPRESSION;  Surgeon: [name], MD;  Location: [place];  Service: Orthopedics;  Laterality: Right;  Only need 1 hour for case,  Axillary block/Mac   CARDIAC CATHETERIZATION  10/27/2002   CARPAL TUNNEL RELEASE Right 11/21/2013   CARPAL TUNNEL RELEASE Right 04/15/2021   Procedure: CARPAL TUNNEL RELEASE;  Surgeon: [name], MD;  Location: [place];  Service: Orthopedics;  Laterality: Right;   CORONARY STENT PLACEMENT  10/27/2002   mid LAD   TOTAL HIP ARTHROPLASTY Left 04/09/1999   TOTAL HIP ARTHROPLASTY Right    TOTAL KNEE ARTHROPLASTY Left 02/03/2011    TOTAL KNEE ARTHROPLASTY Right 05/11/2021   Procedure: TOTAL KNEE ARTHROPLASTY;  Surgeon: Gaynelle Arabian, MD;  [facility];  Service: Orthopedics;  Laterality: Right;   ULNAR TUNNEL RELEASE Left 06/18/2016   Procedure: LEFT CUBITAL TUNNEL RELEASE;  Surgeon: [name], MD;  Location: [place];  Service: Orthopedics;  Laterality: Left;   Patient Active Problem List   Diagnosis Date Noted   OA (osteoarthritis) of knee 05/11/2021   Primary osteoarthritis of right knee 05/11/2021   Colon cancer screening 10/31/2020   Gastroesophageal reflux disease 10/31/2020   Morbid obesity ([place]) 10/31/2020   Personal history of colonic polyps 10/31/2020   Pain of left hip joint 12/13/2019   AVNRT (AV nodal re-entry tachycardia) ([place])    Withdrawal arrhythmia ([place])    Cubital tunnel syndrome, left 06/29/2016   CAD (coronary artery disease), native coronary artery 05/08/2014   Hyperlipidemia 03/21/2013    Class: Chronic   Old MI (myocardial infarction) 03/21/2013    Class: Chronic   DM (diabetes mellitus), type 2, uncontrolled 03/21/2013    Class: Chronic   Essential hypertension, benign 03/21/2013    Class: Chronic   Obesity (BMI 30-39.9) 03/21/2013   Chronic combined systolic and diastolic CHF (congestive heart failure) ([place])      REFERRING PROVIDER: Dr Larey Dresser       REFERRING DIAG: Right TKA  THERAPY DIAG:  Right TKA    ONSET DATE: Surgery 05/11/2021   SUBJECTIVE:    SUBJECTIVE STATEMENT: Patient is 5 weeks and 4 days post op.  Pt has been working his normal work hours without adverse effects.  Pt denies any adverse effects after prior Rx, just some soreness.  Pt saw MD this week and he was pleased with progress.  Pt reports has been performing some of his HEP.  Pt states he is more mobile with the cane.    PERTINENT HISTORY: Anterior interosseus decompression;  Left and right hip replacement  Left knee replacement Low Back Pain    PAIN:  PAIN:    Are you having pain? Yes NPRS scale: 1-2/10  Pain location: R knee Pain orientation:right knee  PAIN TYPE: aching/Spasming  Pain description: intermittent  Aggravating factors: standing and walking Relieving factors: spasms go away    PRECAUTIONS: Fall   WEIGHT BEARING RESTRICTIONS Yes WBAT      PLOF: Independent   PATIENT GOALS  Get back moving comfortably      OBJECTIVE:   R knee AROM 7 - 104 deg        TODAY'S TREATMENT: -Reviewed current function, HEP compliance, pain level, and response to prior Rx. -Assessed ROM -Pt performed: Nu-step x 5 mins with UEs/LEs  Quad sets x15 reps with 5 sec hold with heel propped SLR 1 lb 2x10  LAQ 2x12 with 2#  SAQ 2x10 with 2lbs  Step up 4 inch 2x10  Lateral step up 2x10 on 2 inch TKE with RTB 2x10 reps        Manual Therapy:   -Manual Knee extension stretch with PT hand under heel and slow with heel propped on half roll - Grade II and III PA and AP mobilizations for flexion and extension and 4D patellar mobs - Pt received R knee flexion and extension PROM in supine   PATIENT EDUCATION:  Education details:  reviewed POC, exercise form, objective findings   Person educated: Patient  Education method: Explanation, Demonstration, Tactile cues, and Verbal cues Education comprehension: verbalized understanding, returned demonstration, verbal cues required, tactile cues required, and needs further education     HOME EXERCISE PROGRAM: Access Code: [email or web address] URL: [email or web address] Date: 05/15/2021 Prepared by: Carolyne Littles   Exercises Supine Quad Set - 1 x daily - 7 x weekly - 2 sets - 10 reps - 3-5 hold Supine Heel Slide with Strap - 1 x daily - 7 x weekly - 2 reps - 15sec hold Supine Knee Extension Stretch on Towel Roll - 1 x daily - 7 x weekly - 3 sets - 3 reps - 15sec hold Seated Hamstring Stretch - 1 x daily - 7 x weekly - 3 sets - 3 reps - 20 sec hold     ASSESSMENT:   CLINICAL  IMPRESSION: Pt continues to have limitations in extension ROM though is improving with flexion ROM.  Pt has returned to work and hasn't performed his HEP as much.  Pt did state he felt tighter today. Pt is improving with strength as evidenced by performance and progression of exercises.  Pt continues to progress well with gait, function and mobility.  He is ambulating more with cane though had decreased heel strike and foot clearance on R LE.  Pt responded well to Rx having no increased pain after Rx.  He should cont to benefit from cont skilled PT services per protocol to address ongoing goals and to restore desired level of function.  Objective impairments include Abnormal gait, decreased activity tolerance, decreased balance, decreased endurance, decreased knowledge of use of DME, difficulty walking, decreased ROM, decreased strength, increased fascial restrictions, impaired sensation, impaired UE functional use, improper body mechanics, and pain. These impairments are limiting patient from cleaning, community activity, driving, occupation, yard work, and shopping. Personal factors including 3+ comorbidities: DMII, multi joint replacement; chronic back pain   are also affecting patient's functional outcome.    REHAB POTENTIAL: Good   CLINICAL DECISION MAKING: Evolving/moderate complexity signifcant mobility limitations    EVALUATION COMPLEXITY: Moderate     GOALS: Goals reviewed with patient? No   SHORT TERM GOALS:   STG Name Target Date Goal status  1 Patient will improve passive knee extension by 10 degrees  Baseline:  06/12/2021 1/23 -4 ongoing   2 Patient will ambulate 20' with supervision and walker  Baseline:  06/12/2021 1/20  Patient has progressed off the walker to a cane Achived     3 Patient will transfer sit to stand with supervision  Baseline: 06/12/2021 Improved to CGA 12/29 No assist  Achieved   LONG TERM GOALS:    LTG Name Target Date Goal status 1/23  1 Patient will  go up and down 4 steps without pain in order to get in and out of his house  Baseline: 07/10/2021 Has progressed to a 4 inc step   2 Patient will ambulate 500' without increased pain with LRAD in order to go to his MD appointments  Baseline: 07/10/2021 INITIAL Ambulating with LRAD at this time  3 Patient will be independent with bed mobility  Baseline: 07/10/2021 Independent witout back pain/ achieved   PLAN: PT FREQUENCY: 2x/week   PT DURATION: 8 weeks   PLANNED INTERVENTIONS: Therapeutic exercises, Therapeutic activity, Neuro Muscular re-education, Balance training, Gait training, Patient/Family education, Joint mobilization, Stair training, DME instructions, Aquatic Therapy, Dry Needling, Electrical stimulation, Cryotherapy, Taping, Vasopneumatic device, Ultrasound, and Manual therapy   PLAN FOR NEXT SESSION: Continue to progress stair training and functional mobility training.   Selinda Michaels III PT, DPT 06/20/21 8:17 AM

## 2021-06-20 ENCOUNTER — Encounter (HOSPITAL_BASED_OUTPATIENT_CLINIC_OR_DEPARTMENT_OTHER): Payer: Self-pay | Admitting: Physical Therapy

## 2021-06-22 ENCOUNTER — Ambulatory Visit (HOSPITAL_BASED_OUTPATIENT_CLINIC_OR_DEPARTMENT_OTHER): Payer: 59 | Admitting: Physical Therapy

## 2021-06-22 ENCOUNTER — Encounter (HOSPITAL_BASED_OUTPATIENT_CLINIC_OR_DEPARTMENT_OTHER): Payer: Self-pay | Admitting: Physical Therapy

## 2021-06-22 ENCOUNTER — Other Ambulatory Visit: Payer: Self-pay

## 2021-06-22 DIAGNOSIS — M545 Low back pain, unspecified: Secondary | ICD-10-CM | POA: Diagnosis not present

## 2021-06-22 DIAGNOSIS — R262 Difficulty in walking, not elsewhere classified: Secondary | ICD-10-CM | POA: Diagnosis not present

## 2021-06-22 DIAGNOSIS — R2689 Other abnormalities of gait and mobility: Secondary | ICD-10-CM

## 2021-06-22 DIAGNOSIS — M25561 Pain in right knee: Secondary | ICD-10-CM | POA: Diagnosis not present

## 2021-06-22 DIAGNOSIS — M25661 Stiffness of right knee, not elsewhere classified: Secondary | ICD-10-CM | POA: Diagnosis not present

## 2021-06-22 DIAGNOSIS — M6281 Muscle weakness (generalized): Secondary | ICD-10-CM | POA: Diagnosis not present

## 2021-06-22 NOTE — Therapy (Signed)
OUTPATIENT PHYSICAL THERAPY TREATMENT NOTE   Patient Name: Ryan VANDERWEELE, MD MRN: [id number] DOB:03-Jun-1950, 71 y.o., male Today's Date: 06/22/2021  PCP: Ginger Organ., MD    PT End of Session - 06/22/21 5784     Visit Number 12    Number of Visits 16    Date for PT Re-Evaluation 07/10/21    Authorization Type [employer]    PT Start Time 6962   patient 11 min late   PT Stop Time 1515    PT Time Calculation (min) 34 min    Activity Tolerance Patient tolerated treatment well    Behavior During Therapy [facility] for tasks assessed/performed                  Past Medical History:  Diagnosis Date   Chronic combined systolic and diastolic CHF (congestive heart failure) ([place])    Coronary atherosclerosis of native coronary artery    Dental crowns present    Essential hypertension, benign    states under control with meds., has been on med. x 15 yr.   GERD (gastroesophageal reflux disease)    Hepatitis    Drug induced hepatitis   MI (myocardial infarction) ([place]) 10/27/2002   Non-insulin dependent type 2 diabetes mellitus ([place])    Obesity    Osteoarthritis    hips and knees   Pure hypercholesterolemia    Sleep apnea    Past Surgical History:  Procedure Laterality Date   ANTERIOR INTEROSSEOUS NERVE DECOMPRESSION Right 04/15/2021   Procedure: ANTERIOR INTEROSSEOUS NERVE DECOMPRESSION;  Surgeon: [name], MD;  Location: [place];  Service: Orthopedics;  Laterality: Right;  Only need 1 hour for case,  Axillary block/Mac   CARDIAC CATHETERIZATION  10/27/2002   CARPAL TUNNEL RELEASE Right 11/21/2013   CARPAL TUNNEL RELEASE Right 04/15/2021   Procedure: CARPAL TUNNEL RELEASE;  Surgeon: [name], MD;  Location: [place];  Service: Orthopedics;  Laterality: Right;   CORONARY STENT PLACEMENT  10/27/2002   mid LAD   TOTAL HIP ARTHROPLASTY Left 04/09/1999   TOTAL HIP ARTHROPLASTY Right    TOTAL KNEE ARTHROPLASTY  Left 02/03/2011   TOTAL KNEE ARTHROPLASTY Right 05/11/2021   Procedure: TOTAL KNEE ARTHROPLASTY;  Surgeon: Gaynelle Arabian, MD;  Location: [facility];  Service: Orthopedics;  Laterality: Right;   ULNAR TUNNEL RELEASE Left 06/18/2016   Procedure: LEFT CUBITAL TUNNEL RELEASE;  Surgeon: [name], MD;  Location: [place];  Service: Orthopedics;  Laterality: Left;   Patient Active Problem List   Diagnosis Date Noted   OA (osteoarthritis) of knee 05/11/2021   Primary osteoarthritis of right knee 05/11/2021   Colon cancer screening 10/31/2020   Gastroesophageal reflux disease 10/31/2020   Morbid obesity ([place]) 10/31/2020   Personal history of colonic polyps 10/31/2020   Pain of left hip joint 12/13/2019   AVNRT (AV nodal re-entry tachycardia) ([place])    Withdrawal arrhythmia ([place])    Cubital tunnel syndrome, left 06/29/2016   CAD (coronary artery disease), native coronary artery 05/08/2014   Hyperlipidemia 03/21/2013    Class: Chronic   Old MI (myocardial infarction) 03/21/2013    Class: Chronic   DM (diabetes mellitus), type 2, uncontrolled 03/21/2013    Class: Chronic   Essential hypertension, benign 03/21/2013    Class: Chronic   Obesity (BMI 30-39.9) 03/21/2013   Chronic combined systolic and diastolic CHF (congestive heart failure) ([place])      REFERRING PROVIDER: Dr Larey Dresser  REFERRING DIAG: Right TKA    THERAPY DIAG:  Right TKA    ONSET DATE: Surgery 05/11/2021   SUBJECTIVE:    SUBJECTIVE STATEMENT: Patient continues to have no complaints. He has been working on his exercises at home.  PERTINENT HISTORY: Anterior interosseus decompression;  Left and right hip replacement  Left knee replacement Low Back Pain    PAIN:  PAIN:   Are you having pain? Yes NPRS scale: 1-2/10  Pain location: R knee Pain orientation:right knee  PAIN TYPE: aching/Spasming  Pain description: intermittent  Aggravating factors: standing and walking Relieving  factors: spasms go away    PRECAUTIONS: Fall   WEIGHT BEARING RESTRICTIONS Yes WBAT      PLOF: Independent   PATIENT GOALS  Get back moving comfortably      OBJECTIVE:   R knee AROM 7 - 104 deg        TODAY'S TREATMENT: 1/30 Nu-step x 5 mins with UEs/LEs  SLR 1 lb 3x10  LAQ 30 with 2#  SAQ 2x10 with 2lbs  Step up 4 inch 2x10  Lateral step up 2x10 on 2 inch Step down 2 inch 2x10      Manual Therapy:   -Manual Knee extension stretch with PT hand under heel and slow with heel propped on half roll - Grade II and III PA and AP mobilizations for flexion and extension and 4D patellar mobs - Pt received R knee flexion and extension PROM in supine  1/27  -Reviewed current function, HEP compliance, pain level, and response to prior Rx. -Assessed ROM -Pt performed: Nu-step x 5 mins with UEs/LEs  Quad sets x15 reps with 5 sec hold with heel propped SLR 1 lb 2x10  LAQ 2x12 with 2#  SAQ 2x10 with 2lbs  Step up 4 inch 2x10  Lateral step up 2x10 on 2 inch TKE with RTB 2x10 reps        Manual Therapy:   -Manual Knee extension stretch with PT hand under heel and slow with heel propped on half roll - Grade II and III PA and AP mobilizations for flexion and extension and 4D patellar mobs - Pt received R knee flexion and extension PROM in supine   PATIENT EDUCATION:  Education details:  reviewed POC, exercise form, objective findings   Person educated: Patient  Education method: Explanation, Demonstration, Tactile cues, and Verbal cues Education comprehension: verbalized understanding, returned demonstration, verbal cues required, tactile cues required, and needs further education     HOME EXERCISE PROGRAM: Access Code: [email or web address] URL: [email or web address] Date: 05/15/2021 Prepared by: Carolyne Littles   Exercises Supine Quad Set - 1 x daily - 7 x weekly - 2 sets - 10 reps - 3-5 hold Supine Heel Slide with Strap - 1 x daily - 7 x weekly - 2 reps - 15sec  hold Supine Knee Extension Stretch on Towel Roll - 1 x daily - 7 x weekly - 3 sets - 3 reps - 15sec hold Seated Hamstring Stretch - 1 x daily - 7 x weekly - 3 sets - 3 reps - 20 sec hold     ASSESSMENT:   CLINICAL IMPRESSION: Therapy was able to add step downs today. He had minor difficulty, but improved with practice. His total arc today was measured at 5-108/ He was advised to continue with his extension stretching at home. He is back at work now. He will reduce his frequency to 1x a week. Therapy educated him on the importance of working on that extension as  he reduces his frequency. He tolerated all of his other exercises well.   Objective impairments include Abnormal gait, decreased activity tolerance, decreased balance, decreased endurance, decreased knowledge of use of DME, difficulty walking, decreased ROM, decreased strength, increased fascial restrictions, impaired sensation, impaired UE functional use, improper body mechanics, and pain. These impairments are limiting patient from cleaning, community activity, driving, occupation, yard work, and shopping. Personal factors including 3+ comorbidities: DMII, multi joint replacement; chronic back pain   are also affecting patient's functional outcome.    REHAB POTENTIAL: Good   CLINICAL DECISION MAKING: Evolving/moderate complexity signifcant mobility limitations    EVALUATION COMPLEXITY: Moderate     GOALS: Goals reviewed with patient? No   SHORT TERM GOALS:   STG Name Target Date Goal status  1 Patient will improve passive knee extension by 10 degrees  Baseline:  06/12/2021 1/23 -4 ongoing   2 Patient will ambulate 20' with supervision and walker  Baseline:  06/12/2021 1/20  Patient has progressed off the walker to a cane Achived     3 Patient will transfer sit to stand with supervision  Baseline: 06/12/2021 Improved to CGA 12/29 No assist  Achieved   LONG TERM GOALS:    LTG Name Target Date Goal status 1/23  1 Patient  will go up and down 4 steps without pain in order to get in and out of his house  Baseline: 07/10/2021 Has progressed to a 4 inc step   2 Patient will ambulate 500' without increased pain with LRAD in order to go to his MD appointments  Baseline: 07/10/2021 INITIAL Ambulating with LRAD at this time  3 Patient will be independent with bed mobility  Baseline: 07/10/2021 Independent witout back pain/ achieved   PLAN: PT FREQUENCY: 2x/week   PT DURATION: 8 weeks   PLANNED INTERVENTIONS: Therapeutic exercises, Therapeutic activity, Neuro Muscular re-education, Balance training, Gait training, Patient/Family education, Joint mobilization, Stair training, DME instructions, Aquatic Therapy, Dry Needling, Electrical stimulation, Cryotherapy, Taping, Vasopneumatic device, Ultrasound, and Manual therapy   PLAN FOR NEXT SESSION: Continue to progress stair training and functional mobility training.   Burnis Medin PT DPT  06/22/21 3:44 PM

## 2021-06-26 ENCOUNTER — Ambulatory Visit: Payer: 59 | Admitting: Podiatry

## 2021-07-03 ENCOUNTER — Ambulatory Visit (HOSPITAL_BASED_OUTPATIENT_CLINIC_OR_DEPARTMENT_OTHER): Payer: 59 | Attending: Internal Medicine | Admitting: Physical Therapy

## 2021-07-03 ENCOUNTER — Encounter (HOSPITAL_BASED_OUTPATIENT_CLINIC_OR_DEPARTMENT_OTHER): Payer: Self-pay | Admitting: Physical Therapy

## 2021-07-03 ENCOUNTER — Other Ambulatory Visit: Payer: Self-pay

## 2021-07-03 DIAGNOSIS — R262 Difficulty in walking, not elsewhere classified: Secondary | ICD-10-CM | POA: Insufficient documentation

## 2021-07-03 DIAGNOSIS — M6281 Muscle weakness (generalized): Secondary | ICD-10-CM | POA: Insufficient documentation

## 2021-07-03 DIAGNOSIS — M25561 Pain in right knee: Secondary | ICD-10-CM | POA: Insufficient documentation

## 2021-07-03 DIAGNOSIS — M25661 Stiffness of right knee, not elsewhere classified: Secondary | ICD-10-CM | POA: Insufficient documentation

## 2021-07-03 DIAGNOSIS — M545 Low back pain, unspecified: Secondary | ICD-10-CM | POA: Insufficient documentation

## 2021-07-03 DIAGNOSIS — R2689 Other abnormalities of gait and mobility: Secondary | ICD-10-CM | POA: Insufficient documentation

## 2021-07-03 NOTE — Therapy (Signed)
OUTPATIENT PHYSICAL THERAPY TREATMENT NOTE   Patient Name: [name], MD MRN: [id number] DOB:[birth year], 71 y.o., male Today's Date: 07/03/2021  PCP: Ginger Organ., MD    PT End of Session - 07/03/21 9030     Visit Number 13    Number of Visits 16    Date for PT Re-Evaluation 07/10/21    Authorization Type [employer]    PT Start Time 1300    PT Stop Time 0923    PT Time Calculation (min) 43 min    Activity Tolerance Patient tolerated treatment well    Behavior During Therapy [facility] for tasks assessed/performed                  Past Medical History:  Diagnosis Date   Chronic combined systolic and diastolic CHF (congestive heart failure) ([place])    Coronary atherosclerosis of native coronary artery    Dental crowns present    Essential hypertension, benign    states under control with meds., has been on med. x 15 yr.   GERD (gastroesophageal reflux disease)    Hepatitis    Drug induced hepatitis   MI (myocardial infarction) ([place]) 10/27/2002   Non-insulin dependent type 2 diabetes mellitus ([place])    Obesity    Osteoarthritis    hips and knees   Pure hypercholesterolemia    Sleep apnea    Past Surgical History:  Procedure Laterality Date   ANTERIOR INTEROSSEOUS NERVE DECOMPRESSION Right 04/15/2021   Procedure: ANTERIOR INTEROSSEOUS NERVE DECOMPRESSION;  Surgeon: [name], MD;  Location: [place];  Service: Orthopedics;  Laterality: Right;  Only need 1 hour for case,  Axillary block/Mac   CARDIAC CATHETERIZATION  10/27/2002   CARPAL TUNNEL RELEASE Right 11/21/2013   CARPAL TUNNEL RELEASE Right 04/15/2021   Procedure: CARPAL TUNNEL RELEASE;  Surgeon: [name], MD;  Location: [place];  Service: Orthopedics;  Laterality: Right;   CORONARY STENT PLACEMENT  10/27/2002   mid LAD   TOTAL HIP ARTHROPLASTY Left 04/09/1999   TOTAL HIP ARTHROPLASTY Right    TOTAL KNEE ARTHROPLASTY Left 02/03/2011    TOTAL KNEE ARTHROPLASTY Right 05/11/2021   Procedure: TOTAL KNEE ARTHROPLASTY;  Surgeon: Gaynelle Arabian, MD;  [facility];  Service: Orthopedics;  Laterality: Right;   ULNAR TUNNEL RELEASE Left 06/18/2016   Procedure: LEFT CUBITAL TUNNEL RELEASE;  Surgeon: [name], MD;  Location: [place];  Service: Orthopedics;  Laterality: Left;   Patient Active Problem List   Diagnosis Date Noted   OA (osteoarthritis) of knee 05/11/2021   Primary osteoarthritis of right knee 05/11/2021   Colon cancer screening 10/31/2020   Gastroesophageal reflux disease 10/31/2020   Morbid obesity ([place]) 10/31/2020   Personal history of colonic polyps 10/31/2020   Pain of left hip joint 12/13/2019   AVNRT (AV nodal re-entry tachycardia) ([place])    Withdrawal arrhythmia ([place])    Cubital tunnel syndrome, left 06/29/2016   CAD (coronary artery disease), native coronary artery 05/08/2014   Hyperlipidemia 03/21/2013    Class: Chronic   Old MI (myocardial infarction) 03/21/2013    Class: Chronic   DM (diabetes mellitus), type 2, uncontrolled 03/21/2013    Class: Chronic   Essential hypertension, benign 03/21/2013    Class: Chronic   Obesity (BMI 30-39.9) 03/21/2013   Chronic combined systolic and diastolic CHF (congestive heart failure) ([place])      REFERRING PROVIDER: Dr Larey Dresser       REFERRING DIAG: Right TKA  THERAPY DIAG:  Right TKA    ONSET DATE: Surgery 05/11/2021   SUBJECTIVE:    SUBJECTIVE STATEMENT: Patient continues to have no complaints. He has been working on his exercises at home.  PERTINENT HISTORY: Anterior interosseus decompression;  Left and right hip replacement  Left knee replacement Low Back Pain    PAIN:  PAIN:   Are you having pain? Yes NPRS scale: 1-2/10  Pain location: R knee Pain orientation:right knee  PAIN TYPE: aching/Spasming  Pain description: intermittent  Aggravating factors: standing and walking Relieving factors: spasms go away     PRECAUTIONS: Fall   WEIGHT BEARING RESTRICTIONS Yes WBAT      PLOF: Independent   PATIENT GOALS  Get back moving comfortably      OBJECTIVE:   R knee AROM 7 - 104 deg        TODAY'S TREATMENT: 2/10 Nu-step x 5 mins with UEs/LEs  SLR 1 lb 3x10  LAQ 30 with 2#  SAQ 2x10 with 2lbs  Step up 4 inch 2x10  Lateral step up 2x10 on 2 inch Step down 2 inch 2x10  Squats 2x10    1/30 Nu-step x 5 mins with UEs/LEs  SLR 1 lb 3x10  LAQ 30 with 2#  SAQ 2x10 with 2lbs  Step up 4 inch 2x10  Lateral step up 2x10 on 2 inch Step down 2 inch 2x10      Manual Therapy:   -Manual Knee extension stretch with PT hand under heel and slow with heel propped on half roll - Grade II and III PA and AP mobilizations for flexion and extension and 4D patellar mobs - Pt received R knee flexion and extension PROM in supine  1/27  -Reviewed current function, HEP compliance, pain level, and response to prior Rx. -Assessed ROM -Pt performed: Nu-step x 5 mins with UEs/LEs  Quad sets x15 reps with 5 sec hold with heel propped SLR 1 lb 2x10  LAQ 2x12 with 2#  SAQ 2x10 with 2lbs  Step up 4 inch 2x10  Lateral step up 2x10 on 2 inch TKE with RTB 2x10 reps        Manual Therapy:   -Manual Knee extension stretch with PT hand under heel and slow with heel propped on half roll - Grade II and III PA and AP mobilizations for flexion and extension and 4D patellar mobs - Pt received R knee flexion and extension PROM in supine   PATIENT EDUCATION:  Education details:  reviewed POC, exercise form, objective findings   Person educated: Patient  Education method: Explanation, Demonstration, Tactile cues, and Verbal cues Education comprehension: verbalized understanding, returned demonstration, verbal cues required, tactile cues required, and needs further education     HOME EXERCISE PROGRAM: Access Code: [email or web address] URL: [email or web address] Date: 05/15/2021 Prepared by: Carolyne Littles   Exercises Supine Quad Set - 1 x daily - 7 x weekly - 2 sets - 10 reps - 3-5 hold Supine Heel Slide with Strap - 1 x daily - 7 x weekly - 2 reps - 15sec hold Supine Knee Extension Stretch on Towel Roll - 1 x daily - 7 x weekly - 3 sets - 3 reps - 15sec hold Seated Hamstring Stretch - 1 x daily - 7 x weekly - 3 sets - 3 reps - 20 sec hold     ASSESSMENT:   CLINICAL IMPRESSION: The patient is progressing well with hs strength and pain levels. His extension is still lacking. He reports he hasn't been able  to stretch as much because his schedule at work has been very busy. Therapy was able to advance his weight with SLR today and advance his step height with forward step up without pain. He was encouraged to continue with his HEP at home.   Objective impairments include Abnormal gait, decreased activity tolerance, decreased balance, decreased endurance, decreased knowledge of use of DME, difficulty walking, decreased ROM, decreased strength, increased fascial restrictions, impaired sensation, impaired UE functional use, improper body mechanics, and pain. These impairments are limiting patient from cleaning, community activity, driving, occupation, yard work, and shopping. Personal factors including 3+ comorbidities: DMII, multi joint replacement; chronic back pain   are also affecting patient's functional outcome.    REHAB POTENTIAL: Good   CLINICAL DECISION MAKING: Evolving/moderate complexity signifcant mobility limitations    EVALUATION COMPLEXITY: Moderate     GOALS: Goals reviewed with patient? No   SHORT TERM GOALS:   STG Name Target Date Goal status  1 Patient will improve passive knee extension by 10 degrees  Baseline:  06/12/2021 1/23 -4 ongoing   2 Patient will ambulate 20' with supervision and walker  Baseline:  06/12/2021 1/20  Patient has progressed off the walker to a cane Achived     3 Patient will transfer sit to stand with supervision  Baseline: 06/12/2021  Improved to CGA 12/29 No assist  Achieved   LONG TERM GOALS:    LTG Name Target Date Goal status 1/23  1 Patient will go up and down 4 steps without pain in order to get in and out of his house  Baseline: 07/10/2021 Has progressed to a 4 inc step   2 Patient will ambulate 500' without increased pain with LRAD in order to go to his MD appointments  Baseline: 07/10/2021 INITIAL Ambulating with LRAD at this time  3 Patient will be independent with bed mobility  Baseline: 07/10/2021 Independent witout back pain/ achieved   PLAN: PT FREQUENCY: 2x/week   PT DURATION: 8 weeks   PLANNED INTERVENTIONS: Therapeutic exercises, Therapeutic activity, Neuro Muscular re-education, Balance training, Gait training, Patient/Family education, Joint mobilization, Stair training, DME instructions, Aquatic Therapy, Dry Needling, Electrical stimulation, Cryotherapy, Taping, Vasopneumatic device, Ultrasound, and Manual therapy   PLAN FOR NEXT SESSION: Continue to progress stair training and functional mobility training.   Burnis Medin PT DPT  07/03/21 1:08 PM

## 2021-07-06 ENCOUNTER — Encounter (HOSPITAL_BASED_OUTPATIENT_CLINIC_OR_DEPARTMENT_OTHER): Payer: Self-pay | Admitting: Physical Therapy

## 2021-07-06 ENCOUNTER — Other Ambulatory Visit: Payer: Self-pay

## 2021-07-06 ENCOUNTER — Ambulatory Visit (HOSPITAL_BASED_OUTPATIENT_CLINIC_OR_DEPARTMENT_OTHER): Payer: 59 | Admitting: Physical Therapy

## 2021-07-06 DIAGNOSIS — M25561 Pain in right knee: Secondary | ICD-10-CM | POA: Diagnosis not present

## 2021-07-06 DIAGNOSIS — R262 Difficulty in walking, not elsewhere classified: Secondary | ICD-10-CM | POA: Diagnosis not present

## 2021-07-06 DIAGNOSIS — R2689 Other abnormalities of gait and mobility: Secondary | ICD-10-CM

## 2021-07-06 DIAGNOSIS — M6281 Muscle weakness (generalized): Secondary | ICD-10-CM

## 2021-07-06 DIAGNOSIS — M25661 Stiffness of right knee, not elsewhere classified: Secondary | ICD-10-CM

## 2021-07-06 DIAGNOSIS — M545 Low back pain, unspecified: Secondary | ICD-10-CM | POA: Diagnosis not present

## 2021-07-06 NOTE — Therapy (Signed)
OUTPATIENT PHYSICAL THERAPY TREATMENT NOTE   Patient Name: Ryan HIPPE, MD MRN: [id number] DOB:1951/02/24, 71 y.o., male Today's Date: 07/06/2021  PCP: Ginger Organ., MD    PT End of Session - 07/06/21 1036     Visit Number 14    Number of Visits 16    Date for PT Re-Evaluation 07/10/21    Authorization Type [employer]    PT Start Time 7517    PT Stop Time 1057    PT Time Calculation (min) 42 min    Activity Tolerance Patient tolerated treatment well    Behavior During Therapy [facility] for tasks assessed/performed                  Past Medical History:  Diagnosis Date   Chronic combined systolic and diastolic CHF (congestive heart failure) ([place])    Coronary atherosclerosis of native coronary artery    Dental crowns present    Essential hypertension, benign    states under control with meds., has been on med. x 15 yr.   GERD (gastroesophageal reflux disease)    Hepatitis    Drug induced hepatitis   MI (myocardial infarction) ([place]) 10/27/2002   Non-insulin dependent type 2 diabetes mellitus ([place])    Obesity    Osteoarthritis    hips and knees   Pure hypercholesterolemia    Sleep apnea    Past Surgical History:  Procedure Laterality Date   ANTERIOR INTEROSSEOUS NERVE DECOMPRESSION Right 04/15/2021   Procedure: ANTERIOR INTEROSSEOUS NERVE DECOMPRESSION;  Surgeon: [name], MD;  Location: [place];  Service: Orthopedics;  Laterality: Right;  Only need 1 hour for case,  Axillary block/Mac   CARDIAC CATHETERIZATION  10/27/2002   CARPAL TUNNEL RELEASE Right 11/21/2013   CARPAL TUNNEL RELEASE Right 04/15/2021   Procedure: CARPAL TUNNEL RELEASE;  Surgeon: [name], MD;  Location: [place];  Service: Orthopedics;  Laterality: Right;   CORONARY STENT PLACEMENT  10/27/2002   mid LAD   TOTAL HIP ARTHROPLASTY Left 04/09/1999   TOTAL HIP ARTHROPLASTY Right    TOTAL KNEE ARTHROPLASTY Left 02/03/2011    TOTAL KNEE ARTHROPLASTY Right 05/11/2021   Procedure: TOTAL KNEE ARTHROPLASTY;  Surgeon: Gaynelle Arabian, MD;  Location: [facility];  Service: Orthopedics;  Laterality: Right;   ULNAR TUNNEL RELEASE Left 06/18/2016   Procedure: LEFT CUBITAL TUNNEL RELEASE;  Surgeon: [name], MD;  Location: [place];  Service: Orthopedics;  Laterality: Left;   Patient Active Problem List   Diagnosis Date Noted   OA (osteoarthritis) of knee 05/11/2021   Primary osteoarthritis of right knee 05/11/2021   Colon cancer screening 10/31/2020   Gastroesophageal reflux disease 10/31/2020   Morbid obesity ([place]) 10/31/2020   Personal history of colonic polyps 10/31/2020   Pain of left hip joint 12/13/2019   AVNRT (AV nodal re-entry tachycardia) ([place])    Withdrawal arrhythmia ([place])    Cubital tunnel syndrome, left 06/29/2016   CAD (coronary artery disease), native coronary artery 05/08/2014   Hyperlipidemia 03/21/2013    Class: Chronic   Old MI (myocardial infarction) 03/21/2013    Class: Chronic   DM (diabetes mellitus), type 2, uncontrolled 03/21/2013    Class: Chronic   Essential hypertension, benign 03/21/2013    Class: Chronic   Obesity (BMI 30-39.9) 03/21/2013   Chronic combined systolic and diastolic CHF (congestive heart failure) ([place])      REFERRING PROVIDER: Dr Larey Dresser       REFERRING DIAG: Right TKA  THERAPY DIAG:  Right TKA    ONSET DATE: Surgery 05/11/2021   SUBJECTIVE:    SUBJECTIVE STATEMENT: Patient continues to have no complaints. He has been working on his exercises at home.  PERTINENT HISTORY: Anterior interosseus decompression;  Left and right hip replacement  Left knee replacement Low Back Pain    PAIN:  PAIN:   Are you having pain? Yes NPRS scale: 1-2/10  Pain location: R knee Pain orientation:right knee  PAIN TYPE: aching/Spasming  Pain description: intermittent  Aggravating factors: standing and walking Relieving factors: spasms go away     PRECAUTIONS: Fall   WEIGHT BEARING RESTRICTIONS Yes WBAT      PLOF: Independent   PATIENT GOALS  Get back moving comfortably      OBJECTIVE:   R knee AROM 4 - 110 deg  2/13        TODAY'S TREATMENT: 2/13 Nu-step x 5 mins with UEs/LEs  SLR 2# lb 3x10  LAQ 30 with 2#  SAQ 2x10 with 2lbs  Step up 6 inch 2x10  Lateral step up 2x10 on 6 inch  Squats 2x10  Step onto air-ex x20   2/10 Nu-step x 5 mins with UEs/LEs  SLR 1 lb 3x10  LAQ 30 with 2#  SAQ 2x10 with 2lbs  Step up 4 inch 2x10  Lateral step up 2x10 on 2 inch Step down 2 inch 2x10  Squats 2x10   PATIENT EDUCATION:  Education details:  technique with extension stretching  Person educated: Patient  Education method: Explanation, Demonstration, Tactile cues, and Verbal cues Education comprehension: verbalized understanding, returned demonstration, verbal cues required, tactile cues required, and needs further education     HOME EXERCISE PROGRAM: Access Code: [email or web address] URL: [email or web address] Date: 05/15/2021 Prepared by: Carolyne Littles   Exercises Supine Quad Set - 1 x daily - 7 x weekly - 2 sets - 10 reps - 3-5 hold Supine Heel Slide with Strap - 1 x daily - 7 x weekly - 2 reps - 15sec hold Supine Knee Extension Stretch on Towel Roll - 1 x daily - 7 x weekly - 3 sets - 3 reps - 15sec hold Seated Hamstring Stretch - 1 x daily - 7 x weekly - 3 sets - 3 reps - 20 sec hold     ASSESSMENT:   CLINICAL IMPRESSION: Therapy advanced the patients step today. He had no increase in pain with 6 inch step. His extension is the best that it has been. He was strongly advised to continue to work on it at home. Therapy also added in training on an unstable surface. He did well. He had no pain or loss ovf balance. Therapy will continue to progress as tolerated.   Objective impairments include Abnormal gait, decreased activity tolerance, decreased balance, decreased endurance, decreased knowledge of use  of DME, difficulty walking, decreased ROM, decreased strength, increased fascial restrictions, impaired sensation, impaired UE functional use, improper body mechanics, and pain. These impairments are limiting patient from cleaning, community activity, driving, occupation, yard work, and shopping. Personal factors including 3+ comorbidities: DMII, multi joint replacement; chronic back pain   are also affecting patient's functional outcome.    REHAB POTENTIAL: Good   CLINICAL DECISION MAKING: Evolving/moderate complexity signifcant mobility limitations    EVALUATION COMPLEXITY: Moderate     GOALS: Goals reviewed with patient? No   SHORT TERM GOALS:   STG Name Target Date Goal status  1 Patient will improve passive knee extension by 10 degrees  Baseline:  06/12/2021 1/23 -4  ongoing   2 Patient will ambulate 22' with supervision and walker  Baseline:  06/12/2021 1/20  Patient has progressed off the walker to a cane Achived     3 Patient will transfer sit to stand with supervision  Baseline: 06/12/2021 Improved to CGA 12/29 No assist  Achieved   LONG TERM GOALS:    LTG Name Target Date Goal status 1/23  1 Patient will go up and down 4 steps without pain in order to get in and out of his house  Baseline: 07/10/2021 Has progressed to a 4 inc step   2 Patient will ambulate 500' without increased pain with LRAD in order to go to his MD appointments  Baseline: 07/10/2021 INITIAL Ambulating with LRAD at this time  3 Patient will be independent with bed mobility  Baseline: 07/10/2021 Independent witout back pain/ achieved   PLAN: PT FREQUENCY: 2x/week   PT DURATION: 8 weeks   PLANNED INTERVENTIONS: Therapeutic exercises, Therapeutic activity, Neuro Muscular re-education, Balance training, Gait training, Patient/Family education, Joint mobilization, Stair training, DME instructions, Aquatic Therapy, Dry Needling, Electrical stimulation, Cryotherapy, Taping, Vasopneumatic device, Ultrasound, and  Manual therapy   PLAN FOR NEXT SESSION: Continue to progress stair training and functional mobility training.   Burnis Medin PT DPT  07/06/21 11:01 AM

## 2021-07-09 ENCOUNTER — Other Ambulatory Visit (HOSPITAL_COMMUNITY): Payer: Self-pay

## 2021-07-09 ENCOUNTER — Other Ambulatory Visit: Payer: Self-pay | Admitting: Interventional Cardiology

## 2021-07-09 MED ORDER — TRIAMTERENE-HCTZ 37.5-25 MG PO CAPS
1.0000 | ORAL_CAPSULE | Freq: Every morning | ORAL | 2 refills | Status: DC
  Filled 2021-07-09: qty 90, 90d supply, fill #0
  Filled 2021-10-16: qty 90, 90d supply, fill #1
  Filled 2022-01-11: qty 90, 90d supply, fill #2

## 2021-07-09 MED ORDER — AMLODIPINE BESYLATE 5 MG PO TABS
ORAL_TABLET | Freq: Every day | ORAL | 2 refills | Status: DC
  Filled 2021-07-09: qty 90, 90d supply, fill #0
  Filled 2021-10-16: qty 90, 90d supply, fill #1
  Filled 2022-01-11: qty 90, 90d supply, fill #2

## 2021-07-09 MED ORDER — CARVEDILOL 25 MG PO TABS
ORAL_TABLET | Freq: Two times a day (BID) | ORAL | 2 refills | Status: DC
  Filled 2021-07-09: qty 180, 90d supply, fill #0
  Filled 2021-10-16: qty 180, 90d supply, fill #1
  Filled 2022-01-11: qty 180, 90d supply, fill #2

## 2021-07-09 MED ORDER — ATORVASTATIN CALCIUM 40 MG PO TABS
ORAL_TABLET | ORAL | 3 refills | Status: DC
  Filled 2021-07-09: qty 90, 90d supply, fill #0
  Filled 2021-10-16: qty 90, 90d supply, fill #1
  Filled 2022-01-11: qty 90, 90d supply, fill #2
  Filled 2022-05-03: qty 90, 90d supply, fill #3

## 2021-07-10 ENCOUNTER — Other Ambulatory Visit (HOSPITAL_COMMUNITY): Payer: Self-pay

## 2021-07-10 ENCOUNTER — Ambulatory Visit (INDEPENDENT_AMBULATORY_CARE_PROVIDER_SITE_OTHER): Payer: 59 | Admitting: Podiatry

## 2021-07-10 ENCOUNTER — Encounter: Payer: Self-pay | Admitting: Podiatry

## 2021-07-10 ENCOUNTER — Other Ambulatory Visit: Payer: Self-pay

## 2021-07-10 DIAGNOSIS — E1149 Type 2 diabetes mellitus with other diabetic neurological complication: Secondary | ICD-10-CM

## 2021-07-10 DIAGNOSIS — E1140 Type 2 diabetes mellitus with diabetic neuropathy, unspecified: Secondary | ICD-10-CM | POA: Diagnosis not present

## 2021-07-10 DIAGNOSIS — B351 Tinea unguium: Secondary | ICD-10-CM | POA: Diagnosis not present

## 2021-07-10 DIAGNOSIS — M79675 Pain in left toe(s): Secondary | ICD-10-CM

## 2021-07-10 DIAGNOSIS — M79674 Pain in right toe(s): Secondary | ICD-10-CM

## 2021-07-11 NOTE — Progress Notes (Signed)
Subjective:   Patient ID: Ryan Bombard, MD, male   DOB: 71 y.o.   MRN: [id number]   HPI Patient presents with chronic nail disease 1-5 both feet that he cannot take care of long-term diabetes with other health issues.  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

## 2021-07-13 ENCOUNTER — Other Ambulatory Visit: Payer: Self-pay

## 2021-07-13 ENCOUNTER — Encounter (HOSPITAL_BASED_OUTPATIENT_CLINIC_OR_DEPARTMENT_OTHER): Payer: Self-pay | Admitting: Physical Therapy

## 2021-07-13 ENCOUNTER — Ambulatory Visit (HOSPITAL_BASED_OUTPATIENT_CLINIC_OR_DEPARTMENT_OTHER): Payer: 59 | Admitting: Physical Therapy

## 2021-07-13 DIAGNOSIS — M25561 Pain in right knee: Secondary | ICD-10-CM | POA: Diagnosis not present

## 2021-07-13 DIAGNOSIS — R2689 Other abnormalities of gait and mobility: Secondary | ICD-10-CM

## 2021-07-13 DIAGNOSIS — M25661 Stiffness of right knee, not elsewhere classified: Secondary | ICD-10-CM

## 2021-07-13 DIAGNOSIS — R262 Difficulty in walking, not elsewhere classified: Secondary | ICD-10-CM

## 2021-07-13 DIAGNOSIS — M545 Low back pain, unspecified: Secondary | ICD-10-CM

## 2021-07-13 DIAGNOSIS — M6281 Muscle weakness (generalized): Secondary | ICD-10-CM

## 2021-07-13 NOTE — Therapy (Signed)
OUTPATIENT PHYSICAL THERAPY TREATMENT NOTE   Patient Name: Ryan MANNOR, MD MRN: [id number] DOB:12-Jul-1950, 71 y.o., male Today's Date: 07/13/2021  PCP: Ginger Organ., MD    PT End of Session - 07/13/21 1058     Visit Number 15    Number of Visits 27    Date for PT Re-Evaluation 08/24/21    Authorization Type [employer]    PT Start Time 0930    PT Stop Time 1012    PT Time Calculation (min) 42 min    Activity Tolerance Patient tolerated treatment well    Behavior During Therapy [facility] for tasks assessed/performed                   Past Medical History:  Diagnosis Date   Chronic combined systolic and diastolic CHF (congestive heart failure) ([place])    Coronary atherosclerosis of native coronary artery    Dental crowns present    Essential hypertension, benign    states under control with meds., has been on med. x 15 yr.   GERD (gastroesophageal reflux disease)    Hepatitis    Drug induced hepatitis   MI (myocardial infarction) ([place]) 10/27/2002   Non-insulin dependent type 2 diabetes mellitus ([place])    Obesity    Osteoarthritis    hips and knees   Pure hypercholesterolemia    Sleep apnea    Past Surgical History:  Procedure Laterality Date   ANTERIOR INTEROSSEOUS NERVE DECOMPRESSION Right 04/15/2021   Procedure: ANTERIOR INTEROSSEOUS NERVE DECOMPRESSION;  Surgeon: [name], MD;  Location: [place];  Service: Orthopedics;  Laterality: Right;  Only need 1 hour for case,  Axillary block/Mac   CARDIAC CATHETERIZATION  10/27/2002   CARPAL TUNNEL RELEASE Right 11/21/2013   CARPAL TUNNEL RELEASE Right 04/15/2021   Procedure: CARPAL TUNNEL RELEASE;  Surgeon: [name], MD;  Location: [place];  Service: Orthopedics;  Laterality: Right;   CORONARY STENT PLACEMENT  10/27/2002   mid LAD   TOTAL HIP ARTHROPLASTY Left 04/09/1999   TOTAL HIP ARTHROPLASTY Right    TOTAL KNEE ARTHROPLASTY Left 02/03/2011    TOTAL KNEE ARTHROPLASTY Right 05/11/2021   Procedure: TOTAL KNEE ARTHROPLASTY;  Surgeon: Gaynelle Arabian, MD;  [facility];  Service: Orthopedics;  Laterality: Right;   ULNAR TUNNEL RELEASE Left 06/18/2016   Procedure: LEFT CUBITAL TUNNEL RELEASE;  Surgeon: [name], MD;  Location: [place];  Service: Orthopedics;  Laterality: Left;   Patient Active Problem List   Diagnosis Date Noted   OA (osteoarthritis) of knee 05/11/2021   Primary osteoarthritis of right knee 05/11/2021   Colon cancer screening 10/31/2020   Gastroesophageal reflux disease 10/31/2020   Morbid obesity ([place]) 10/31/2020   Personal history of colonic polyps 10/31/2020   Pain of left hip joint 12/13/2019   AVNRT (AV nodal re-entry tachycardia) ([place])    Withdrawal arrhythmia ([place])    Cubital tunnel syndrome, left 06/29/2016   CAD (coronary artery disease), native coronary artery 05/08/2014   Hyperlipidemia 03/21/2013    Class: Chronic   Old MI (myocardial infarction) 03/21/2013    Class: Chronic   DM (diabetes mellitus), type 2, uncontrolled 03/21/2013    Class: Chronic   Essential hypertension, benign 03/21/2013    Class: Chronic   Obesity (BMI 30-39.9) 03/21/2013   Chronic combined systolic and diastolic CHF (congestive heart failure) ([place])      REFERRING PROVIDER: Dr Larey Dresser       REFERRING DIAG: Right  TKA    THERAPY DIAG:  Right TKA    ONSET DATE: Surgery 05/11/2021   SUBJECTIVE:    SUBJECTIVE STATEMENT: Patient continues to have no complaints. He has been working on his exercises at home.  PERTINENT HISTORY: Anterior interosseus decompression;  Left and right hip replacement  Left knee replacement Low Back Pain    PAIN:  PAIN:   Are you having pain? Yes NPRS scale: 1-2/10  Pain location: R knee Pain orientation:right knee  PAIN TYPE: aching/Spasming  Pain description: intermittent  Aggravating factors: standing and walking Relieving factors: spasms go away     PRECAUTIONS: Fall   WEIGHT BEARING RESTRICTIONS Yes WBAT      PLOF: Independent   PATIENT GOALS  Get back moving comfortably      OBJECTIVE:   R knee AROM 3 - 113 deg  2/20    LE AROM/PROM:   A/PROM Right 05/15/2021 Left 05/15/2021  Hip flexion      Hip extension      Hip abduction      Hip adduction      Hip internal rotation      Hip external rotation      Knee flexion      Knee extension      Ankle dorsiflexion      Ankle plantarflexion      Ankle inversion      Ankle eversion       (Blank rows = not tested)   LE MMT:   MMT Right 07/13/2021 Left 07/13/2021  Hip flexion 4+/5 4+/5  Hip extension      Hip abduction 4+/5 4+/5  Hip adduction      Hip internal rotation      Hip external rotation      Knee flexion 4/5 4+/5  Knee extension 4/5 4+/5  Ankle dorsiflexion      Ankle plantarflexion      Ankle inversion      Ankle eversion       (Blank rows = not tested)        TODAY'S TREATMENT: 2/20 Nu-step x 5 mins with UEs/LEs  SLR 2# lb 3x10  LAQ x3030 with 2#  SAQ 2x10 with 2lbs  Step up 6 inch 2x10  Lateral step up 2x10 on 6 inch             Squats 2x10    2/13 Nu-step x 5 mins with UEs/LEs  SLR 2# lb 3x10  LAQ 30 with 2#  SAQ 2x10 with 2lbs  Step up 6 inch 2x10  Lateral step up 2x10 on 6 inch  Squats 2x10  Step onto air-ex x20   2/10 Nu-step x 5 mins with UEs/LEs  SLR 1 lb 3x10  LAQ 30 with 2#  SAQ 2x10 with 2lbs  Step up 4 inch 2x10  Lateral step up 2x10 on 2 inch Step down 2 inch 2x10  Squats 2x10   PATIENT EDUCATION:  Education details:  technique with extension stretching  Person educated: Patient  Education method: Explanation, Demonstration, Tactile cues, and Verbal cues Education comprehension: verbalized understanding, returned demonstration, verbal cues required, tactile cues required, and needs further education     HOME EXERCISE PROGRAM: Access Code: [email or web address] URL: [email or web address] Date:  05/15/2021 Prepared by: Carolyne Littles   Exercises Supine Quad Set - 1 x daily - 7 x weekly - 2 sets - 10 reps - 3-5 hold Supine Heel Slide with Strap - 1 x daily - 7 x weekly - 2 reps -  15sec hold Supine Knee Extension Stretch on Towel Roll - 1 x daily - 7 x weekly - 3 sets - 3 reps - 15sec hold Seated Hamstring Stretch - 1 x daily - 7 x weekly - 3 sets - 3 reps - 20 sec hold     ASSESSMENT:   CLINICAL IMPRESSION: The patient continues to make good progress. His extension is the best that it has been. He was encouraged to continue to work on the extension at home . He is tolerating stair training well. Today we worked on squats as well. We will continue to progress him with functional strengthening tasks as tolerated. The patient overall is making progress. His strength has progressed as well as his range. We continue to work on functional strengthening such as squats and steps. We will continue 1-2W6.   Objective impairments include Abnormal gait, decreased activity tolerance, decreased balance, decreased endurance, decreased knowledge of use of DME, difficulty walking, decreased ROM, decreased strength, increased fascial restrictions, impaired sensation, impaired UE functional use, improper body mechanics, and pain. These impairments are limiting patient from cleaning, community activity, driving, occupation, yard work, and shopping. Personal factors including 3+ comorbidities: DMII, multi joint replacement; chronic back pain   are also affecting patient's functional outcome.    REHAB POTENTIAL: Good   CLINICAL DECISION MAKING: Evolving/moderate complexity signifcant mobility limitations    EVALUATION COMPLEXITY: Moderate     GOALS: Goals reviewed with patient? No   SHORT TERM GOALS:   STG Name Target Date Goal status  1 Patient will improve passive knee extension by 10 degrees  Baseline:  06/12/2021 1/23 -4 ongoing   2 Patient will ambulate 20' with supervision and walker   Baseline:  06/12/2021 1/20  Patient has progressed off the walker to a cane Achived     3 Patient will transfer sit to stand with supervision  Baseline: 06/12/2021 Improved to CGA 12/29 No assist  Achieved   LONG TERM GOALS:    LTG Name Target Date Goal status 1/23  1 Patient will go up and down 4 steps without pain in order to get in and out of his house  Baseline: 07/10/2021 Has progressed to a 4 inc step   2 Patient will ambulate 500' without increased pain with LRAD in order to go to his MD appointments  Baseline: 07/10/2021 INITIAL Ambulating with LRAD at this time  3 Patient will be independent with bed mobility  Baseline: 07/10/2021 Independent witout back pain/ achieved   PLAN: PT FREQUENCY: 2x/week   PT DURATION: 8 weeks   PLANNED INTERVENTIONS: Therapeutic exercises, Therapeutic activity, Neuro Muscular re-education, Balance training, Gait training, Patient/Family education, Joint mobilization, Stair training, DME instructions, Aquatic Therapy, Dry Needling, Electrical stimulation, Cryotherapy, Taping, Vasopneumatic device, Ultrasound, and Manual therapy   PLAN FOR NEXT SESSION: Continue to progress stair training and functional mobility training.   Burnis Medin PT DPT  07/13/21 1:12 PM

## 2021-07-20 ENCOUNTER — Other Ambulatory Visit: Payer: Self-pay

## 2021-07-20 ENCOUNTER — Encounter (HOSPITAL_BASED_OUTPATIENT_CLINIC_OR_DEPARTMENT_OTHER): Payer: Self-pay | Admitting: Physical Therapy

## 2021-07-20 ENCOUNTER — Ambulatory Visit (HOSPITAL_BASED_OUTPATIENT_CLINIC_OR_DEPARTMENT_OTHER): Payer: 59 | Admitting: Physical Therapy

## 2021-07-20 DIAGNOSIS — R262 Difficulty in walking, not elsewhere classified: Secondary | ICD-10-CM

## 2021-07-20 DIAGNOSIS — M25561 Pain in right knee: Secondary | ICD-10-CM

## 2021-07-20 DIAGNOSIS — M25661 Stiffness of right knee, not elsewhere classified: Secondary | ICD-10-CM

## 2021-07-20 DIAGNOSIS — R2689 Other abnormalities of gait and mobility: Secondary | ICD-10-CM

## 2021-07-20 DIAGNOSIS — M6281 Muscle weakness (generalized): Secondary | ICD-10-CM | POA: Diagnosis not present

## 2021-07-20 DIAGNOSIS — M545 Low back pain, unspecified: Secondary | ICD-10-CM | POA: Diagnosis not present

## 2021-07-20 NOTE — Therapy (Signed)
OUTPATIENT PHYSICAL THERAPY TREATMENT NOTE   Patient Name: TRUSTEN HUME, MD MRN: [id number] DOB:17-Feb-1951, 71 y.o., male Today's Date: 07/20/2021  PCP: Ginger Organ., MD    PT End of Session - 07/20/21 0941     Visit Number 16    Number of Visits 27    Date for PT Re-Evaluation 08/24/21    PT Start Time 0930    PT Stop Time 1013    PT Time Calculation (min) 43 min    Activity Tolerance Patient tolerated treatment well    Behavior During Therapy [facility] for tasks assessed/performed                    Past Medical History:  Diagnosis Date   Chronic combined systolic and diastolic CHF (congestive heart failure) (HCC)    Coronary atherosclerosis of native coronary artery    Dental crowns present    Essential hypertension, benign    states under control with meds., has been on med. x 15 yr.   GERD (gastroesophageal reflux disease)    Hepatitis    Drug induced hepatitis   MI (myocardial infarction) ([place]) 10/27/2002   Non-insulin dependent type 2 diabetes mellitus ([place])    Obesity    Osteoarthritis    hips and knees   Pure hypercholesterolemia    Sleep apnea    Past Surgical History:  Procedure Laterality Date   ANTERIOR INTEROSSEOUS NERVE DECOMPRESSION Right 04/15/2021   Procedure: ANTERIOR INTEROSSEOUS NERVE DECOMPRESSION;  Surgeon: [name], MD;  Location: [facility];  Service: Orthopedics;  Laterality: Right;  Only need 1 hour for case,  Axillary block/Mac   CARDIAC CATHETERIZATION  10/27/2002   CARPAL TUNNEL RELEASE Right 11/21/2013   CARPAL TUNNEL RELEASE Right 04/15/2021   Procedure: CARPAL TUNNEL RELEASE;  Surgeon: [name], MD;  Location: [place];  Service: Orthopedics;  Laterality: Right;   CORONARY STENT PLACEMENT  10/27/2002   mid LAD   TOTAL HIP ARTHROPLASTY Left 04/09/1999   TOTAL HIP ARTHROPLASTY Right    TOTAL KNEE ARTHROPLASTY Left 02/03/2011   TOTAL KNEE ARTHROPLASTY Right 05/11/2021    Procedure: TOTAL KNEE ARTHROPLASTY;  Surgeon: Gaynelle Arabian, MD;  [facility];  Service: Orthopedics;  Laterality: Right;   ULNAR TUNNEL RELEASE Left 06/18/2016   Procedure: LEFT CUBITAL TUNNEL RELEASE;  Surgeon: [name], MD;  Location: [place];  Service: Orthopedics;  Laterality: Left;   Patient Active Problem List   Diagnosis Date Noted   OA (osteoarthritis) of knee 05/11/2021   Primary osteoarthritis of right knee 05/11/2021   Colon cancer screening 10/31/2020   Gastroesophageal reflux disease 10/31/2020   Morbid obesity ([place]) 10/31/2020   Personal history of colonic polyps 10/31/2020   Pain of left hip joint 12/13/2019   AVNRT (AV nodal re-entry tachycardia) ([place])    Withdrawal arrhythmia (HCC)    Cubital tunnel syndrome, left 06/29/2016   CAD (coronary artery disease), native coronary artery 05/08/2014   Hyperlipidemia 03/21/2013    Class: Chronic   Old MI (myocardial infarction) 03/21/2013    Class: Chronic   DM (diabetes mellitus), type 2, uncontrolled 03/21/2013    Class: Chronic   Essential hypertension, benign 03/21/2013    Class: Chronic   Obesity (BMI 30-39.9) 03/21/2013   Chronic combined systolic and diastolic CHF (congestive heart failure) ([place])      REFERRING PROVIDER: Dr Larey Dresser       REFERRING DIAG: Right TKA    THERAPY DIAG:  Right TKA    ONSET DATE: Surgery 05/11/2021   SUBJECTIVE:    SUBJECTIVE STATEMENT: Patient continues to have no complaints. He has been working on his exercises at home.  PERTINENT HISTORY: Anterior interosseus decompression;  Left and right hip replacement  Left knee replacement Low Back Pain    PAIN:  PAIN:   Are you having pain? Yes NPRS scale: 1-2/10  Pain location: R knee Pain orientation:right knee  PAIN TYPE: aching/Spasming  Pain description: intermittent  Aggravating factors: standing and walking Relieving factors: spasms go away    PRECAUTIONS: Fall   WEIGHT BEARING  RESTRICTIONS Yes WBAT      PLOF: Independent   PATIENT GOALS  Get back moving comfortably      OBJECTIVE:   R knee AROM 3 - 113 deg  2/20      TODAY'S TREATMENT: 2/27 Nu-step x 7 mins with UEs/LEs moved to L4 for last 2 min  SLR 2# lb 3x10  LAQ x3030 with 2#  SAQ 2x10 with 2lbs  Step up 6 inch 2x10  Lateral step up 2x10 on 6 inch             Squats 2x10  Hurdle step over x20  Manual: extension streching; grade II and III mobilization  Patellar mobilization   2/20 Nu-step x 5 mins with UEs/LEs  SLR 2# lb 3x10  LAQ x3030 with 2#  SAQ 2x10 with 2lbs  Step up 6 inch 2x10  Lateral step up 2x10 on 6 inch             Squats 2x10    PATIENT EDUCATION:  Education details:  technique with extension stretching  Person educated: Patient  Education method: Explanation, Demonstration, Tactile cues, and Verbal cues Education comprehension: verbalized understanding, returned demonstration, verbal cues required, tactile cues required, and needs further education     HOME EXERCISE PROGRAM: Access Code: [email or web address] URL: [email or web address] Date: 05/15/2021 Prepared by: Carolyne Littles   Exercises Supine Quad Set - 1 x daily - 7 x weekly - 2 sets - 10 reps - 3-5 hold Supine Heel Slide with Strap - 1 x daily - 7 x weekly - 2 reps - 15sec hold Supine Knee Extension Stretch on Towel Roll - 1 x daily - 7 x weekly - 3 sets - 3 reps - 15sec hold Seated Hamstring Stretch - 1 x daily - 7 x weekly - 3 sets - 3 reps - 20 sec hold     ASSESSMENT:   CLINICAL IMPRESSION: The patients extension continues to improve. He was 3 degrees from straight today. He continues to work on it at home. He will be seeing the MD at home this week. We continue to work on staps and squats. We also worked on hurdle step overs today. He had no increase in pain. His squat looked good. We will continue to work on functional activity as tolerated.   Objective impairments include Abnormal gait,  decreased activity tolerance, decreased balance, decreased endurance, decreased knowledge of use of DME, difficulty walking, decreased ROM, decreased strength, increased fascial restrictions, impaired sensation, impaired UE functional use, improper body mechanics, and pain. These impairments are limiting patient from cleaning, community activity, driving, occupation, yard work, and shopping. Personal factors including 3+ comorbidities: DMII, multi joint replacement; chronic back pain   are also affecting patient's functional outcome.    REHAB POTENTIAL: Good   CLINICAL DECISION MAKING: Evolving/moderate complexity signifcant mobility limitations    EVALUATION COMPLEXITY: Moderate     GOALS:  Goals reviewed with patient? No   SHORT TERM GOALS:   STG Name Target Date Goal status  1 Patient will improve passive knee extension by 10 degrees  Baseline:  06/12/2021 1/23 -4 ongoing   2 Patient will ambulate 20' with supervision and walker  Baseline:  06/12/2021 1/20  Patient has progressed off the walker to a cane Achived     3 Patient will transfer sit to stand with supervision  Baseline: 06/12/2021 Improved to CGA 12/29 No assist  Achieved   LONG TERM GOALS:    LTG Name Target Date Goal status 1/23  1 Patient will go up and down 4 steps without pain in order to get in and out of his house  Baseline: 07/10/2021 Has progressed to a 4 inc step   2 Patient will ambulate 500' without increased pain with LRAD in order to go to his MD appointments  Baseline: 07/10/2021 INITIAL Ambulating with LRAD at this time  3 Patient will be independent with bed mobility  Baseline: 07/10/2021 Independent witout back pain/ achieved   PLAN: PT FREQUENCY: 2x/week   PT DURATION: 8 weeks   PLANNED INTERVENTIONS: Therapeutic exercises, Therapeutic activity, Neuro Muscular re-education, Balance training, Gait training, Patient/Family education, Joint mobilization, Stair training, DME instructions, Aquatic Therapy,  Dry Needling, Electrical stimulation, Cryotherapy, Taping, Vasopneumatic device, Ultrasound, and Manual therapy   PLAN FOR NEXT SESSION: Continue to progress stair training and functional mobility training.   Burnis Medin PT DPT  07/20/21 1:21 PM

## 2021-07-27 ENCOUNTER — Other Ambulatory Visit: Payer: Self-pay

## 2021-07-27 ENCOUNTER — Ambulatory Visit (HOSPITAL_BASED_OUTPATIENT_CLINIC_OR_DEPARTMENT_OTHER): Payer: 59 | Attending: Internal Medicine | Admitting: Physical Therapy

## 2021-07-27 ENCOUNTER — Encounter (HOSPITAL_BASED_OUTPATIENT_CLINIC_OR_DEPARTMENT_OTHER): Payer: Self-pay | Admitting: Physical Therapy

## 2021-07-27 DIAGNOSIS — R262 Difficulty in walking, not elsewhere classified: Secondary | ICD-10-CM | POA: Diagnosis not present

## 2021-07-27 DIAGNOSIS — R2689 Other abnormalities of gait and mobility: Secondary | ICD-10-CM | POA: Insufficient documentation

## 2021-07-27 DIAGNOSIS — M25561 Pain in right knee: Secondary | ICD-10-CM | POA: Diagnosis not present

## 2021-07-27 DIAGNOSIS — M25661 Stiffness of right knee, not elsewhere classified: Secondary | ICD-10-CM | POA: Diagnosis not present

## 2021-07-27 DIAGNOSIS — M6281 Muscle weakness (generalized): Secondary | ICD-10-CM | POA: Insufficient documentation

## 2021-07-27 DIAGNOSIS — M545 Low back pain, unspecified: Secondary | ICD-10-CM | POA: Diagnosis not present

## 2021-07-27 NOTE — Therapy (Addendum)
OUTPATIENT PHYSICAL THERAPY TREATMENT NOTE/Discharge    Patient Name: Ryan BALLENGEE, MD MRN: [id number] DOB:26-Oct-1950, 71 y.o., male Today's Date: 07/27/2021  PCP: Ginger Organ., MD    PT End of Session - 07/27/21 [phone]     Visit Number 17    Number of Visits 27    Date for PT Re-Evaluation 08/24/21    Authorization Type [employer]    PT Start Time 0930    PT Stop Time 1012    PT Time Calculation (min) 42 min    Activity Tolerance Patient tolerated treatment well    Behavior During Therapy [facility] for tasks assessed/performed                    Past Medical History:  Diagnosis Date   Chronic combined systolic and diastolic CHF (congestive heart failure) ([place])    Coronary atherosclerosis of native coronary artery    Dental crowns present    Essential hypertension, benign    states under control with meds., has been on med. x 15 yr.   GERD (gastroesophageal reflux disease)    Hepatitis    Drug induced hepatitis   MI (myocardial infarction) ([place]) 10/27/2002   Non-insulin dependent type 2 diabetes mellitus ([place])    Obesity    Osteoarthritis    hips and knees   Pure hypercholesterolemia    Sleep apnea    Past Surgical History:  Procedure Laterality Date   ANTERIOR INTEROSSEOUS NERVE DECOMPRESSION Right 04/15/2021   Procedure: ANTERIOR INTEROSSEOUS NERVE DECOMPRESSION;  Surgeon: [name], MD;  Location: [place];  Service: Orthopedics;  Laterality: Right;  Only need 1 hour for case,  Axillary block/Mac   CARDIAC CATHETERIZATION  10/27/2002   CARPAL TUNNEL RELEASE Right 11/21/2013   CARPAL TUNNEL RELEASE Right 04/15/2021   Procedure: CARPAL TUNNEL RELEASE;  Surgeon: [name], MD;  Location: [place];  Service: Orthopedics;  Laterality: Right;   CORONARY STENT PLACEMENT  10/27/2002   mid LAD   TOTAL HIP ARTHROPLASTY Left 04/09/1999   TOTAL HIP ARTHROPLASTY Right    TOTAL KNEE ARTHROPLASTY Left  02/03/2011   TOTAL KNEE ARTHROPLASTY Right 05/11/2021   Procedure: TOTAL KNEE ARTHROPLASTY;  Surgeon: Gaynelle Arabian, MD;  [facility];  Service: Orthopedics;  Laterality: Right;   ULNAR TUNNEL RELEASE Left 06/18/2016   Procedure: LEFT CUBITAL TUNNEL RELEASE;  Surgeon: [name], MD;  Location: [place];  Service: Orthopedics;  Laterality: Left;   Patient Active Problem List   Diagnosis Date Noted   OA (osteoarthritis) of knee 05/11/2021   Primary osteoarthritis of right knee 05/11/2021   Colon cancer screening 10/31/2020   Gastroesophageal reflux disease 10/31/2020   Morbid obesity ([place]) 10/31/2020   Personal history of colonic polyps 10/31/2020   Pain of left hip joint 12/13/2019   AVNRT (AV nodal re-entry tachycardia) ([place])    Withdrawal arrhythmia ([place])    Cubital tunnel syndrome, left 06/29/2016   CAD (coronary artery disease), native coronary artery 05/08/2014   Hyperlipidemia 03/21/2013    Class: Chronic   Old MI (myocardial infarction) 03/21/2013    Class: Chronic   DM (diabetes mellitus), type 2, uncontrolled 03/21/2013    Class: Chronic   Essential hypertension, benign 03/21/2013    Class: Chronic   Obesity (BMI 30-39.9) 03/21/2013   Chronic combined systolic and diastolic CHF (congestive heart failure) ([place])      REFERRING PROVIDER: Dr Larey Dresser       REFERRING  DIAG: Right TKA    THERAPY DIAG:  Right TKA    ONSET DATE: Surgery 05/11/2021   SUBJECTIVE:    SUBJECTIVE STATEMENT: Patient continues to have no complaints. He has been working on his exercises at home.  PERTINENT HISTORY: Anterior interosseus decompression;  Left and right hip replacement  Left knee replacement Low Back Pain    PAIN:  PAIN:   Are you having pain? Yes NPRS scale: 1-2/10  Pain location: R knee Pain orientation:right knee  PAIN TYPE: aching/Spasming  Pain description: intermittent  Aggravating factors: standing and walking Relieving factors:  spasms go away    PRECAUTIONS: Fall   WEIGHT BEARING RESTRICTIONS Yes WBAT      PLOF: Independent   PATIENT GOALS  Get back moving comfortably      OBJECTIVE:   R knee AROM 3 - 113 deg  2/20      TODAY'S TREATMENT: 3/2 Nu-step x 7 mins with UEs/LEs moved to L4 for last 2 min  SLR 2# lb 3x10  LAQ x3030 with 2#  SAQ 2x10 with 2lbs  Step up 6 inch 2x10  Lateral step up 2x10 on 6 inch Heel raise x30  Leg press 40 lbs 3x10    2/27 Nu-step x 7 mins with UEs/LEs moved to L4 for last 2 min  SLR 2# lb 3x10  LAQ x3030 with 2#  SAQ 2x10 with 2lbs  Step up 6 inch 2x10  Lateral step up 2x10 on 6 inch             Squats 2x10  Hurdle step over x20  Manual: extension streching; grade II and III mobilization  Patellar mobilization     PATIENT EDUCATION:  Education details:  technique with extension stretching  Person educated: Patient  Education method: Explanation, Demonstration, Tactile cues, and Verbal cues Education comprehension: verbalized understanding, returned demonstration, verbal cues required, tactile cues required, and needs further education     HOME EXERCISE PROGRAM: Access Code: [email or web address] URL: [email or web address] Date: 05/15/2021 Prepared by: Carolyne Littles   Exercises Supine Quad Set - 1 x daily - 7 x weekly - 2 sets - 10 reps - 3-5 hold Supine Heel Slide with Strap - 1 x daily - 7 x weekly - 2 reps - 15sec hold Supine Knee Extension Stretch on Towel Roll - 1 x daily - 7 x weekly - 3 sets - 3 reps - 15sec hold Seated Hamstring Stretch - 1 x daily - 7 x weekly - 3 sets - 3 reps - 20 sec hold     ASSESSMENT:   CLINICAL IMPRESSION: The patient tolerated treatment well. We added in the leg press today. He had no significant pain. We also worked on side steps. He had more difficulty with side steps. His range continues to improve. We will continue to progress functional activity and exercises as tolerated.   Objective impairments include  Abnormal gait, decreased activity tolerance, decreased balance, decreased endurance, decreased knowledge of use of DME, difficulty walking, decreased ROM, decreased strength, increased fascial restrictions, impaired sensation, impaired UE functional use, improper body mechanics, and pain. These impairments are limiting patient from cleaning, community activity, driving, occupation, yard work, and shopping. Personal factors including 3+ comorbidities: DMII, multi joint replacement; chronic back pain   are also affecting patient's functional outcome.    REHAB POTENTIAL: Good   CLINICAL DECISION MAKING: Evolving/moderate complexity signifcant mobility limitations    EVALUATION COMPLEXITY: Moderate     GOALS: Goals reviewed with patient? No   SHORT  TERM GOALS:   STG Name Target Date Goal status  1 Patient will improve passive knee extension by 10 degrees  Baseline:  06/12/2021 1/23 -4 ongoing   2 Patient will ambulate 20' with supervision and walker  Baseline:  06/12/2021 1/20  Patient has progressed off the walker to a cane Achived     3 Patient will transfer sit to stand with supervision  Baseline: 06/12/2021 Improved to CGA 12/29 No assist  Achieved   LONG TERM GOALS:    LTG Name Target Date Goal status 1/23  1 Patient will go up and down 4 steps without pain in order to get in and out of his house  Baseline: 07/10/2021 Has progressed to a 4 inc step   2 Patient will ambulate 500' without increased pain with LRAD in order to go to his MD appointments  Baseline: 07/10/2021 INITIAL Ambulating with LRAD at this time  3 Patient will be independent with bed mobility  Baseline: 07/10/2021 Independent witout back pain/ achieved   PLAN: PT FREQUENCY: 2x/week   PT DURATION: 8 weeks   PLANNED INTERVENTIONS: Therapeutic exercises, Therapeutic activity, Neuro Muscular re-education, Balance training, Gait training, Patient/Family education, Joint mobilization, Stair training, DME instructions,  Aquatic Therapy, Dry Needling, Electrical stimulation, Cryotherapy, Taping, Vasopneumatic device, Ultrasound, and Manual therapy   PLAN FOR NEXT SESSION: Continue to progress stair training and functional mobility training.   PHYSICAL THERAPY DISCHARGE SUMMARY  Visits from Start of Care: 17  Current functional level related to goals / functional outcomes: Significant improvement in ability to ambulate and work   Remaining deficits: Pain at times   Education / Equipment: HEP   Patient agrees to discharge. Patient goals were met. Patient is being discharged due to meeting the stated rehab goals.   Burnis Medin PT DPT  07/27/21 10:27 AM

## 2021-08-06 DIAGNOSIS — G4733 Obstructive sleep apnea (adult) (pediatric): Secondary | ICD-10-CM | POA: Diagnosis not present

## 2021-08-10 ENCOUNTER — Ambulatory Visit (HOSPITAL_BASED_OUTPATIENT_CLINIC_OR_DEPARTMENT_OTHER): Payer: 59 | Admitting: Physical Therapy

## 2021-08-17 ENCOUNTER — Ambulatory Visit (HOSPITAL_BASED_OUTPATIENT_CLINIC_OR_DEPARTMENT_OTHER): Payer: 59 | Admitting: Physical Therapy

## 2021-08-17 DIAGNOSIS — M79641 Pain in right hand: Secondary | ICD-10-CM | POA: Diagnosis not present

## 2021-08-17 DIAGNOSIS — G5621 Lesion of ulnar nerve, right upper limb: Secondary | ICD-10-CM | POA: Diagnosis not present

## 2021-08-17 DIAGNOSIS — M79642 Pain in left hand: Secondary | ICD-10-CM | POA: Diagnosis not present

## 2021-08-17 DIAGNOSIS — G5603 Carpal tunnel syndrome, bilateral upper limbs: Secondary | ICD-10-CM | POA: Diagnosis not present

## 2021-08-19 ENCOUNTER — Other Ambulatory Visit: Payer: Self-pay | Admitting: Orthopedic Surgery

## 2021-08-19 DIAGNOSIS — M79641 Pain in right hand: Secondary | ICD-10-CM

## 2021-08-19 DIAGNOSIS — M542 Cervicalgia: Secondary | ICD-10-CM

## 2021-08-19 DIAGNOSIS — G5603 Carpal tunnel syndrome, bilateral upper limbs: Secondary | ICD-10-CM

## 2021-08-19 DIAGNOSIS — M5030 Other cervical disc degeneration, unspecified cervical region: Secondary | ICD-10-CM

## 2021-08-19 DIAGNOSIS — M79642 Pain in left hand: Secondary | ICD-10-CM

## 2021-08-19 DIAGNOSIS — G5621 Lesion of ulnar nerve, right upper limb: Secondary | ICD-10-CM

## 2021-08-22 ENCOUNTER — Other Ambulatory Visit (HOSPITAL_COMMUNITY): Payer: Self-pay

## 2021-08-24 ENCOUNTER — Ambulatory Visit (HOSPITAL_BASED_OUTPATIENT_CLINIC_OR_DEPARTMENT_OTHER): Payer: 59 | Admitting: Physical Therapy

## 2021-08-27 ENCOUNTER — Other Ambulatory Visit: Payer: Self-pay | Admitting: Orthopedic Surgery

## 2021-08-27 ENCOUNTER — Telehealth (HOSPITAL_COMMUNITY): Payer: Self-pay | Admitting: Orthopedic Surgery

## 2021-08-27 ENCOUNTER — Other Ambulatory Visit (HOSPITAL_COMMUNITY): Payer: Self-pay | Admitting: Orthopedic Surgery

## 2021-08-27 DIAGNOSIS — G5621 Lesion of ulnar nerve, right upper limb: Secondary | ICD-10-CM

## 2021-08-27 DIAGNOSIS — M542 Cervicalgia: Secondary | ICD-10-CM

## 2021-08-27 NOTE — Telephone Encounter (Signed)
08/27/21~Rcvd call from [place] office to enter order into Epic. [name] refused to schd appt & asked that [facility] call patient to schd appt. Advised of order not rcvd via fax & to plz fax order. MF ?

## 2021-08-31 ENCOUNTER — Ambulatory Visit (HOSPITAL_BASED_OUTPATIENT_CLINIC_OR_DEPARTMENT_OTHER): Payer: 59 | Admitting: Physical Therapy

## 2021-09-14 ENCOUNTER — Ambulatory Visit: Payer: 59 | Admitting: Podiatry

## 2021-09-18 ENCOUNTER — Ambulatory Visit
Admission: RE | Admit: 2021-09-18 | Discharge: 2021-09-18 | Disposition: A | Discharge status: Home / Self Care | Payer: 59 | Source: Ambulatory Visit | Attending: Orthopedic Surgery | Admitting: Orthopedic Surgery

## 2021-09-18 ENCOUNTER — Ambulatory Visit (INDEPENDENT_AMBULATORY_CARE_PROVIDER_SITE_OTHER): Payer: 59 | Admitting: Podiatry

## 2021-09-18 ENCOUNTER — Encounter: Payer: Self-pay | Admitting: Podiatry

## 2021-09-18 DIAGNOSIS — M79675 Pain in left toe(s): Secondary | ICD-10-CM | POA: Diagnosis not present

## 2021-09-18 DIAGNOSIS — M542 Cervicalgia: Secondary | ICD-10-CM

## 2021-09-18 DIAGNOSIS — G5621 Lesion of ulnar nerve, right upper limb: Secondary | ICD-10-CM

## 2021-09-18 DIAGNOSIS — M79674 Pain in right toe(s): Secondary | ICD-10-CM

## 2021-09-18 DIAGNOSIS — E1149 Type 2 diabetes mellitus with other diabetic neurological complication: Secondary | ICD-10-CM | POA: Diagnosis not present

## 2021-09-18 DIAGNOSIS — B351 Tinea unguium: Secondary | ICD-10-CM | POA: Diagnosis not present

## 2021-09-18 DIAGNOSIS — M5023 Other cervical disc displacement, cervicothoracic region: Secondary | ICD-10-CM | POA: Diagnosis not present

## 2021-09-18 DIAGNOSIS — M5032 Other cervical disc degeneration, mid-cervical region, unspecified level: Secondary | ICD-10-CM | POA: Diagnosis not present

## 2021-09-18 DIAGNOSIS — M4802 Spinal stenosis, cervical region: Secondary | ICD-10-CM | POA: Diagnosis not present

## 2021-09-18 DIAGNOSIS — G5603 Carpal tunnel syndrome, bilateral upper limbs: Secondary | ICD-10-CM

## 2021-09-18 DIAGNOSIS — M79642 Pain in left hand: Secondary | ICD-10-CM

## 2021-09-18 DIAGNOSIS — E1140 Type 2 diabetes mellitus with diabetic neuropathy, unspecified: Secondary | ICD-10-CM

## 2021-09-18 DIAGNOSIS — M79641 Pain in right hand: Secondary | ICD-10-CM

## 2021-09-18 DIAGNOSIS — M5030 Other cervical disc degeneration, unspecified cervical region: Secondary | ICD-10-CM

## 2021-09-18 NOTE — Progress Notes (Signed)
Subjective:  ? ?Patient ID: Ryan Bombard, MD, male   DOB: 71 y.o.   MRN: [id number]  ? ?HPI ?Patient presents with incurvated nailbeds 1-5 both feet with long-term diabetes and beds that make it hard for him to wear shoe gear comfortably ? ? ?ROS ? ? ?   ?Objective:  ?Physical Exam  ?Neurovascular status intact thick yellow brittle nailbeds with incurvated corners with patient who is high risk with long-term diabetes and other issues ? ?   ?Assessment:  ?Chronic mycotic nail infection 1-5 both feet ? ?   ?Plan:  ?Debridement of painful nailbeds 1-5 both feet no angiogenic bleeding noted reappoint routine care ?   ? ? ?

## 2021-09-25 DIAGNOSIS — E1165 Type 2 diabetes mellitus with hyperglycemia: Secondary | ICD-10-CM | POA: Diagnosis not present

## 2021-09-25 DIAGNOSIS — I2510 Atherosclerotic heart disease of native coronary artery without angina pectoris: Secondary | ICD-10-CM | POA: Diagnosis not present

## 2021-09-25 DIAGNOSIS — Z9861 Coronary angioplasty status: Secondary | ICD-10-CM | POA: Diagnosis not present

## 2021-09-25 DIAGNOSIS — E785 Hyperlipidemia, unspecified: Secondary | ICD-10-CM | POA: Diagnosis not present

## 2021-09-25 DIAGNOSIS — I10 Essential (primary) hypertension: Secondary | ICD-10-CM | POA: Diagnosis not present

## 2021-09-25 DIAGNOSIS — I5022 Chronic systolic (congestive) heart failure: Secondary | ICD-10-CM | POA: Diagnosis not present

## 2021-09-25 DIAGNOSIS — D696 Thrombocytopenia, unspecified: Secondary | ICD-10-CM | POA: Diagnosis not present

## 2021-09-25 DIAGNOSIS — I252 Old myocardial infarction: Secondary | ICD-10-CM | POA: Diagnosis not present

## 2021-09-28 DIAGNOSIS — G5621 Lesion of ulnar nerve, right upper limb: Secondary | ICD-10-CM | POA: Diagnosis not present

## 2021-09-28 DIAGNOSIS — G5603 Carpal tunnel syndrome, bilateral upper limbs: Secondary | ICD-10-CM | POA: Diagnosis not present

## 2021-09-28 DIAGNOSIS — R29898 Other symptoms and signs involving the musculoskeletal system: Secondary | ICD-10-CM | POA: Diagnosis not present

## 2021-10-16 ENCOUNTER — Other Ambulatory Visit (HOSPITAL_COMMUNITY): Payer: Self-pay

## 2021-10-16 ENCOUNTER — Telehealth: Payer: Self-pay | Admitting: *Deleted

## 2021-10-16 DIAGNOSIS — M542 Cervicalgia: Secondary | ICD-10-CM | POA: Diagnosis not present

## 2021-10-16 NOTE — Telephone Encounter (Signed)
His Plavix may be held for 5 to 7 days prior to his procedure.  Preoperative team, please contact this patient and set up a phone call appointment for further cardiac evaluation.  Thank you for your help.  Jossie Ng. Skyanne Welle NP-C    10/16/2021, 4:46 PM [place] [address] Office [phone] Fax [phone]

## 2021-10-16 NOTE — Telephone Encounter (Signed)
   Pre-operative Risk Assessment    Patient Name: Ryan GAVITT, MD  DOB: Sep 07, 1950 MRN: [id number]      Request for Surgical Clearance    Procedure:   C3-C6 ACDF  Date of Surgery:  Clearance TBD                               Surgeon:  DR. [facility] BROOKS Surgeon's Group or Practice Name:  [facility] Phone number:  [phone] Fax number:  [phone] ATTN: [facility] Slick   Type of Clearance Requested:   - Medical  - Pharmacy:  Hold Clopidogrel (Plavix)     Type of Anesthesia:  General    Additional requests/questions:    Jiles Prows   10/16/2021, 3:45 PM

## 2021-10-17 ENCOUNTER — Other Ambulatory Visit (HOSPITAL_COMMUNITY): Payer: Self-pay

## 2021-10-20 ENCOUNTER — Telehealth: Payer: Self-pay | Admitting: *Deleted

## 2021-10-20 NOTE — Telephone Encounter (Signed)
Pt agreeable to plan of care for tele pre op appt. Med rec and consent are done.     Patient Consent for Virtual Visit        [name], MD has provided verbal consent on 10/20/2021 for a virtual visit (video or telephone).   CONSENT FOR VIRTUAL VISIT FOR:  [name], MD  By participating in this virtual visit I agree to the following:  I hereby voluntarily request, consent and authorize [facility] and its employed or contracted physicians, physician assistants, nurse practitioners or other licensed health care professionals (the Practitioner), to provide me with telemedicine health care services (the "Services") as deemed necessary by the treating Practitioner. I acknowledge and consent to receive the Services by the Practitioner via telemedicine. I understand that the telemedicine visit will involve communicating with the Practitioner through live audiovisual communication technology and the disclosure of certain medical information by electronic transmission. I acknowledge that I have been given the opportunity to request an in-person assessment or other available alternative prior to the telemedicine visit and am voluntarily participating in the telemedicine visit.  I understand that I have the right to withhold or withdraw my consent to the use of telemedicine in the course of my care at any time, without affecting my right to future care or treatment, and that the Practitioner or I may terminate the telemedicine visit at any time. I understand that I have the right to inspect all information obtained and/or recorded in the course of the telemedicine visit and may receive copies of available information for a reasonable fee.  I understand that some of the potential risks of receiving the Services via telemedicine include:  Delay or interruption in medical evaluation due to technological equipment failure or disruption; Information transmitted may not be sufficient (e.g. poor  resolution of images) to allow for appropriate medical decision making by the Practitioner; and/or  In rare instances, security protocols could fail, causing a breach of personal health information.  Furthermore, I acknowledge that it is my responsibility to provide information about my medical history, conditions and care that is complete and accurate to the best of my ability. I acknowledge that Practitioner's advice, recommendations, and/or decision may be based on factors not within their control, such as incomplete or inaccurate data provided by me or distortions of diagnostic images or specimens that may result from electronic transmissions. I understand that the practice of medicine is not an exact science and that Practitioner makes no warranties or guarantees regarding treatment outcomes. I acknowledge that a copy of this consent can be made available to me via my patient portal ([facility]), or I can request a printed copy by calling the office of [facility].    I understand that my insurance will be billed for this visit.   I have read or had this consent read to me. I understand the contents of this consent, which adequately explains the benefits and risks of the Services being provided via telemedicine.  I have been provided ample opportunity to ask questions regarding this consent and the Services and have had my questions answered to my satisfaction. I give my informed consent for the services to be provided through the use of telemedicine in my medical care

## 2021-10-20 NOTE — Telephone Encounter (Addendum)
Pt agreeable to plan of care for tele pre op appt. Med rec and consent are done.   Will send FYI to requesting office the pt has appt 10/23/21

## 2021-10-21 ENCOUNTER — Other Ambulatory Visit (HOSPITAL_COMMUNITY): Payer: Self-pay

## 2021-10-23 ENCOUNTER — Encounter: Payer: Self-pay | Admitting: Nurse Practitioner

## 2021-10-23 ENCOUNTER — Ambulatory Visit: Payer: 59 | Admitting: Nurse Practitioner

## 2021-10-23 ENCOUNTER — Other Ambulatory Visit (HOSPITAL_COMMUNITY): Payer: Self-pay

## 2021-10-23 DIAGNOSIS — Z01810 Encounter for preprocedural cardiovascular examination: Secondary | ICD-10-CM | POA: Diagnosis not present

## 2021-10-23 NOTE — Progress Notes (Signed)
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  This format is felt to be most appropriate for this patient at this time.  The patient did not have access to video technology/had technical difficulties with video requiring transitioning to audio format only (telephone).  All issues noted in this document were discussed and addressed.  No physical exam could be performed with this format.  Please refer to the patient's chart for his consent to telehealth for [facility].  Evaluation Performed:  Preoperative cardiovascular risk assessment _____________   Date:  10/23/2021   Patient ID:  Ryan Bombard, MD, DOB 09/17/1950, MRN [id number] Patient Location:  Home Provider location:   Office  Primary Care Provider:  Ginger Organ., MD Primary Cardiologist:  Sinclair Grooms, MD  Chief Complaint / Patient Profile   71 y.o. 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 who is pending  C3-C6 ACDF and presents today for telephonic preoperative cardiovascular risk assessment.  Past Medical History    Past Medical History:  Diagnosis Date   Chronic combined systolic and diastolic CHF (congestive heart failure) (HCC)    Coronary atherosclerosis of native coronary artery    Dental crowns present    Essential hypertension, benign    states under control with meds., has been on med. x 15 yr.   GERD (gastroesophageal reflux disease)    Hepatitis    Drug induced hepatitis   MI (myocardial infarction) ([place]) 10/27/2002   Non-insulin dependent type 2 diabetes mellitus ([place])    Obesity    Osteoarthritis    hips and knees   Pure hypercholesterolemia    Sleep apnea    Past Surgical History:  Procedure  Laterality Date   ANTERIOR INTEROSSEOUS NERVE DECOMPRESSION Right 04/15/2021   Procedure: ANTERIOR INTEROSSEOUS NERVE DECOMPRESSION;  Surgeon: [name], MD;  Location: [facility];  Service: Orthopedics;  Laterality: Right;  Only need 1 hour for case,  Axillary block/Mac   CARDIAC CATHETERIZATION  10/27/2002   CARPAL TUNNEL RELEASE Right 11/21/2013   CARPAL TUNNEL RELEASE Right 04/15/2021   Procedure: CARPAL TUNNEL RELEASE;  Surgeon: [name], MD;  Location: [place];  Service: Orthopedics;  Laterality: Right;   CORONARY STENT PLACEMENT  10/27/2002   mid LAD   TOTAL HIP ARTHROPLASTY Left 04/09/1999   TOTAL HIP ARTHROPLASTY Right    TOTAL KNEE ARTHROPLASTY Left 02/03/2011   TOTAL KNEE ARTHROPLASTY Right 05/11/2021   Procedure: TOTAL KNEE ARTHROPLASTY;  Surgeon: Gaynelle Arabian, MD;  Location: [facility];  Service: Orthopedics;  Laterality: Right;   ULNAR TUNNEL RELEASE Left 06/18/2016   Procedure: LEFT CUBITAL TUNNEL RELEASE;  Surgeon: [name], MD;  Location: [place];  Service: Orthopedics;  Laterality: Left;    Allergies  No Known Allergies  History of Present Illness    Ryan Bombard, 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  Pt was last seen in cardiology clinic on 02/16/2021 by [name].  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  He was scheduled to have upcoming knee surgery later in 2022.  His 2D echo  completed in 2021 showed EF of 45%. He was euvolemic on examination and with no complaints of ischemia. He was noted to have excellent blood pressure control and adhering to low-sodium diet. Hemoglobin A1c was much improved down from 10.1 to 7.0. The patient is now pending procedure as outlined above. Since his last visit, he states that he is doing well and has no adverse complaints since his  appointment in September.  He is limited in his function regarding exercise with his knee and back however he has no limitations with regard to chest pain or shortness of breath with these activities.  Home Medications    Prior to Admission medications   Medication Sig Start Date End Date Taking? Authorizing Provider  amLODipine (NORVASC) 5 MG tablet TAKE 1 TABLET BY MOUTH ONCE DAILY 07/09/21   Belva Crome, MD  atorvastatin (LIPITOR) 40 MG tablet TAKE 1 TABLET BY MOUTH AT BEDTIME 07/09/21     carvedilol (COREG) 25 MG tablet TAKE 1 TABLET BY MOUTH TWO TIMES DAILY WITH A MEAL 07/09/21   Belva Crome, MD  clopidogrel (PLAVIX) 75 MG tablet Take 1 tablet by mouth once daily Patient taking differently: Take 75 mg by mouth daily. 12/25/20     dapagliflozin propanediol (FARXIGA) 10 MG TABS tablet Take 1 tablet by mouth once daily Patient taking differently: Take 10 mg by mouth daily. 12/25/20     Dulaglutide (TRULICITY) 3 KC/0.0LK SOPN Inject 3 mg Subcutaneously once a week 12/25/20     gabapentin (NEURONTIN) 300 MG capsule Take 1 capsule by mouth 3 times a day for 2 weeks following surgery.Then take 1 capsule twice daily for 2 weeks. Then take 1 capsule once daily for 2 weeks. Then discontinue. 05/12/21   Edmisten, Ok Anis, PA  metFORMIN (GLUCOPHAGE-XR) 500 MG 24 hr tablet Take 2 tablets by mouth twice daily Patient taking differently: Take 1,000 mg by mouth 2 (two) times daily. 12/25/20     methocarbamol (ROBAXIN) 500 MG tablet Take 1 tablet by mouth every 6 hours as needed for muscle spasms. 05/12/21   Edmisten, Kristie L, PA  oxyCODONE (OXY IR/ROXICODONE) 5 MG immediate release tablet Take 1 - 2 tablets by mouth every 6 hours as needed for severe pain. 05/12/21   Edmisten, Kristie L, PA  oxyCODONE (OXY IR/ROXICODONE) 5 MG immediate release tablet Take 1 - 2 tablets by mouth every 8 hours as needed for severe pain. 05/26/21     ramipril (ALTACE) 10 MG capsule Take 1 capsule (10 mg total) by mouth 2 (two)  times daily. 03/30/21     traMADol (ULTRAM) 50 MG tablet Take 1 - 2 tablets by mouth every 6 hours as needed for moderate pain. 05/12/21   Edmisten, Ok Anis, PA  triamterene-hydrochlorothiazide (DYAZIDE) 37.5-25 MG capsule TAKE 1 CAPSULE BY MOUTH ONCE A DAY IN THE MORNING 07/09/21   Belva Crome, MD    Physical Exam    Vital Signs:  Ryan Bombard, MD does not have vital signs available for review today.  None  Given telephonic nature of communication, physical exam is limited. AAOx3. NAD. Normal affect.  Speech and respirations are unlabored.  Accessory Clinical Findings    None  Assessment & Plan    1.  Preoperative Cardiovascular Risk Assessment:  Mr. Moronta perioperative risk of a major cardiac event is 6.6% according to the Revised Cardiac Risk Index (RCRI). Therefore, he is at increased risk for perioperative complications. His functional capacity is good at 5.07 METs according to  the Duke Activity Status Index (DASI).Mr. Tetrault has well-controlled blood pressure and his hemoglobin A1c has decreased to 6.9.  He is free of any anginal complaints at this time.   The patient affirms he has been doing well without any new cardiac symptoms. They are able to achieve 5 METS without cardiac limitations. Therefore, based on ACC/AHA guidelines, the patient would be at acceptable risk for the planned procedure without further cardiovascular testing. The patient was advised that if he develops new symptoms prior to surgery to contact our office to arrange for a follow-up visit, and he verbalized understanding.   Recommendations: According to ACC/AHA guidelines, no further cardiovascular testing needed.  The patient may proceed to surgery at acceptable risk.   Antiplatelet and/or Anticoagulation Recommendations: Clopidogrel (Plavix) can be held for 5-7 days prior to his surgery and resumed as soon as possible post op.    His Plavix may be held for 5 to 7 days prior to his procedure.  A  copy of this note will be routed to requesting surgeon.  Time:   Today, I have spent 6 minutes with the patient with telehealth technology discussing medical history, symptoms, and management plan.     Mable Fill, Marissa Nestle, NP  10/23/2021, 10:28 AM

## 2021-10-27 ENCOUNTER — Ambulatory Visit: Payer: Self-pay | Admitting: Orthopedic Surgery

## 2021-10-30 ENCOUNTER — Other Ambulatory Visit (HOSPITAL_COMMUNITY): Payer: 59

## 2021-10-30 ENCOUNTER — Encounter (HOSPITAL_BASED_OUTPATIENT_CLINIC_OR_DEPARTMENT_OTHER): Payer: Self-pay | Admitting: Physical Therapy

## 2021-10-30 NOTE — Pre-Procedure Instructions (Signed)
Surgical Instructions    Your procedure is scheduled on Monday, June 19th.  Report to [facility] Main Entrance "A" at 11:00 A.M., then check in with the Admitting office.  Call this number if you have problems the morning of surgery:  [phone]   If you have any questions prior to your surgery date call [phone]: Open Monday-Friday 8am-4pm    Remember:  Do not eat or drink after midnight the night before your surgery    Take these medicines the morning of surgery with A SIP OF WATER  amLODipine (NORVASC) carvedilol (COREG)  gabapentin (NEURONTIN)-as needed  Per your cardiologist, HOLD clopidogrel (PLAVIX) 5-7 days prior to surgery.  As of today, STOP taking any Aspirin (unless otherwise instructed by your surgeon) Aleve, Naproxen, Ibuprofen, Motrin, Advil, Goody's, BC's, all herbal medications, fish oil, and all vitamins.  WHAT DO I DO ABOUT MY DIABETES MEDICATION?   Do not take dapagliflozin propanediol (FARXIGA) or metFORMIN (GLUCOPHAGE-XR)  the morning of surgery.  HOLD dapagliflozin propanediol (FARXIGA) on Sunday (6/18) and Monday (6/19).  The day of surgery, do not take other diabetes injectables, including Trulicity (dulaglutide).   HOW TO MANAGE YOUR DIABETES BEFORE AND AFTER SURGERY  Why is it important to control my blood sugar before and after surgery? Improving blood sugar levels before and after surgery helps healing and can limit problems. A way of improving blood sugar control is eating a healthy diet by:  Eating less sugar and carbohydrates  Increasing activity/exercise  Talking with your doctor about reaching your blood sugar goals High blood sugars (greater than 180 mg/dL) can raise your risk of infections and slow your recovery, so you will need to focus on controlling your diabetes during the weeks before surgery. Make sure that the doctor who takes care of your diabetes knows about your planned surgery including the date and location.  How do I  manage my blood sugar before surgery? Check your blood sugar at least 4 times a day, starting 2 days before surgery, to make sure that the level is not too high or low.  Check your blood sugar the morning of your surgery when you wake up and every 2 hours until you get to the Short Stay unit.  If your blood sugar is less than 70 mg/dL, you will need to treat for low blood sugar: Do not take insulin. Treat a low blood sugar (less than 70 mg/dL) with  cup of clear juice (cranberry or apple), 4 glucose tablets, OR glucose gel. Recheck blood sugar in 15 minutes after treatment (to make sure it is greater than 70 mg/dL). If your blood sugar is not greater than 70 mg/dL on recheck, call [phone] for further instructions. Report your blood sugar to the short stay nurse when you get to Short Stay.  If you are admitted to the hospital after surgery: Your blood sugar will be checked by the staff and you will probably be given insulin after surgery (instead of oral diabetes medicines) to make sure you have good blood sugar levels. The goal for blood sugar control after surgery is 80-180 mg/dL.                      Do NOT Smoke (Tobacco/Vaping) for 24 hours prior to your procedure.  If you use a CPAP at night, you may bring your mask/headgear for your overnight stay.   Contacts, glasses, piercing's, hearing aid's, dentures or partials may not be worn into surgery, please bring cases  for these belongings.    For patients admitted to the hospital, discharge time will be determined by your treatment team.   Patients discharged the day of surgery will not be allowed to drive home, and someone needs to stay with them for 24 hours.  SURGICAL WAITING ROOM VISITATION Patients having surgery or a procedure may have two support people in the waiting room. These visitors may be switched out with other visitors if needed. Children under the age of 44 must have an adult accompany them who is not the  patient. If the patient needs to stay at the hospital during part of their recovery, the visitor guidelines for inpatient rooms apply.  Please refer to the [facility] website for the visitor guidelines for Inpatients (after your surgery is over and you are in a regular room).    Special instructions:   [place]- Preparing For Surgery  Before surgery, you can play an important role. Because skin is not sterile, your skin needs to be as free of germs as possible. You can reduce the number of germs on your skin by washing with CHG (chlorahexidine gluconate) Soap before surgery.  CHG is an antiseptic cleaner which kills germs and bonds with the skin to continue killing germs even after washing.    Oral Hygiene is also important to reduce your risk of infection.  Remember - BRUSH YOUR TEETH THE MORNING OF SURGERY WITH YOUR REGULAR TOOTHPASTE  Please do not use if you have an allergy to CHG or antibacterial soaps. If your skin becomes reddened/irritated stop using the CHG.  Do not shave (including legs and underarms) for at least 48 hours prior to first CHG shower. It is OK to shave your face.  Please follow these instructions carefully.   Shower the NIGHT BEFORE SURGERY and the MORNING OF SURGERY  If you chose to wash your hair, wash your hair first as usual with your normal shampoo.  After you shampoo, rinse your hair and body thoroughly to remove the shampoo.  Use CHG Soap as you would any other liquid soap. You can apply CHG directly to the skin and wash gently with a scrungie or a clean washcloth.   Apply the CHG Soap to your body ONLY FROM THE NECK DOWN.  Do not use on open wounds or open sores. Avoid contact with your eyes, ears, mouth and genitals (private parts). Wash Face and genitals (private parts)  with your normal soap.   Wash thoroughly, paying special attention to the area where your surgery will be performed.  Thoroughly rinse your body with warm water from the neck  down.  DO NOT shower/wash with your normal soap after using and rinsing off the CHG Soap.  Pat yourself dry with a CLEAN TOWEL.  Wear CLEAN PAJAMAS to bed the night before surgery  Place CLEAN SHEETS on your bed the night before your surgery  DO NOT SLEEP WITH PETS.   Day of Surgery: Take a shower with CHG soap. Do not wear jewelry. Do not wear lotions, powders, colognes, or deodorant. Men may shave face and neck. Do not bring valuables to the hospital.  [facility] is not responsible for any belongings or valuables. Wear Clean/Comfortable clothing the morning of surgery Remember to brush your teeth WITH YOUR REGULAR TOOTHPASTE.   Please read over the following fact sheets that you were given.    If you received a COVID test during your pre-op visit  it is requested that you wear a mask when  out in public, stay away from anyone that may not be feeling well and notify your surgeon if you develop symptoms. If you have been in contact with anyone that has tested positive in the last 10 days please notify you surgeon.

## 2021-11-02 ENCOUNTER — Encounter (HOSPITAL_COMMUNITY): Payer: Self-pay

## 2021-11-02 ENCOUNTER — Encounter (HOSPITAL_COMMUNITY)
Admission: RE | Admit: 2021-11-02 | Discharge: 2021-11-02 | Disposition: A | Discharge status: Home / Self Care | Payer: 59 | Source: Ambulatory Visit | Attending: Orthopedic Surgery | Admitting: Orthopedic Surgery

## 2021-11-02 ENCOUNTER — Other Ambulatory Visit: Payer: Self-pay

## 2021-11-02 VITALS — BP 125/83 | HR 93 | Temp 97.8°F | Resp 17 | Ht 65.0 in | Wt 208.5 lb

## 2021-11-02 DIAGNOSIS — I110 Hypertensive heart disease with heart failure: Secondary | ICD-10-CM | POA: Insufficient documentation

## 2021-11-02 DIAGNOSIS — Z01812 Encounter for preprocedural laboratory examination: Secondary | ICD-10-CM | POA: Insufficient documentation

## 2021-11-02 DIAGNOSIS — I2510 Atherosclerotic heart disease of native coronary artery without angina pectoris: Secondary | ICD-10-CM | POA: Diagnosis not present

## 2021-11-02 DIAGNOSIS — I252 Old myocardial infarction: Secondary | ICD-10-CM | POA: Insufficient documentation

## 2021-11-02 DIAGNOSIS — Z9989 Dependence on other enabling machines and devices: Secondary | ICD-10-CM | POA: Diagnosis not present

## 2021-11-02 DIAGNOSIS — E785 Hyperlipidemia, unspecified: Secondary | ICD-10-CM | POA: Insufficient documentation

## 2021-11-02 DIAGNOSIS — I5042 Chronic combined systolic (congestive) and diastolic (congestive) heart failure: Secondary | ICD-10-CM | POA: Insufficient documentation

## 2021-11-02 DIAGNOSIS — E119 Type 2 diabetes mellitus without complications: Secondary | ICD-10-CM | POA: Insufficient documentation

## 2021-11-02 DIAGNOSIS — Z955 Presence of coronary angioplasty implant and graft: Secondary | ICD-10-CM | POA: Diagnosis not present

## 2021-11-02 DIAGNOSIS — G4733 Obstructive sleep apnea (adult) (pediatric): Secondary | ICD-10-CM | POA: Diagnosis not present

## 2021-11-02 DIAGNOSIS — Z01818 Encounter for other preprocedural examination: Secondary | ICD-10-CM

## 2021-11-02 DIAGNOSIS — K759 Inflammatory liver disease, unspecified: Secondary | ICD-10-CM | POA: Diagnosis not present

## 2021-11-02 LAB — TYPE AND SCREEN
ABO/RH(D): B POS
Antibody Screen: NEGATIVE

## 2021-11-02 LAB — COMPREHENSIVE METABOLIC PANEL
ALT: 18 U/L (ref 0–44)
AST: 20 U/L (ref 15–41)
Albumin: 3.8 g/dL (ref 3.5–5.0)
Alkaline Phosphatase: 62 U/L (ref 38–126)
Anion gap: 9 (ref 5–15)
BUN: 18 mg/dL (ref 8–23)
CO2: 27 mmol/L (ref 22–32)
Calcium: 8.9 mg/dL (ref 8.9–10.3)
Chloride: 101 mmol/L (ref 98–111)
Creatinine, Ser: 0.99 mg/dL (ref 0.61–1.24)
GFR, Estimated: >60 mL/min (ref >60)
Glucose, Bld: 135 mg/dL — ABNORMAL HIGH (ref 70–99)
Potassium: 3.8 mmol/L (ref 3.5–5.1)
Sodium: 137 mmol/L (ref 135–145)
Total Bilirubin: 1.1 mg/dL (ref 0.3–1.2)
Total Protein: 6.3 g/dL — ABNORMAL LOW (ref 6.5–8.1)

## 2021-11-02 LAB — HEMOGLOBIN A1C
Hgb A1c MFr Bld: 6.8 % — ABNORMAL HIGH (ref 4.8–5.6)
Mean Plasma Glucose: 148.46 mg/dL

## 2021-11-02 LAB — SURGICAL PCR SCREEN
MRSA, PCR: NEGATIVE
Staphylococcus aureus: NEGATIVE

## 2021-11-02 LAB — CBC
HCT: 40.7 % (ref 39.0–52.0)
Hemoglobin: 13 g/dL (ref 13.0–17.0)
MCH: 28 pg (ref 26.0–34.0)
MCHC: 31.9 g/dL (ref 30.0–36.0)
MCV: 87.5 fL (ref 80.0–100.0)
Platelets: 175 10*3/uL (ref 150–400)
RBC: 4.65 MIL/uL (ref 4.22–5.81)
RDW: 16.3 % — ABNORMAL HIGH (ref 11.5–15.5)
WBC: 5.8 10*3/uL (ref 4.0–10.5)
nRBC: 0 % (ref 0.0–0.2)

## 2021-11-02 LAB — GLUCOSE, CAPILLARY: Glucose-Capillary: 150 mg/dL — ABNORMAL HIGH (ref 70–99)

## 2021-11-02 NOTE — Progress Notes (Signed)
PCP - Marton Redwood, MD Cardiologist - Dr. Daneen Schick  PPM/ICD - n/a  Chest x-ray - n/a EKG - 02/16/21 Stress Test - 04/28/18 ECHO - 03/28/20 Cardiac Cath - 2004-stents placed.  Sleep Study - OSA+ CPAP - Uses CPAP some nights  Fasting Blood Sugar - 100-175 Checks Blood Sugar maybe 1 time a week.  Blood Thinner Instructions: Plavix; hold 7 days prior to surgery.  Aspirin Instructions: n/a  NPO at midnight.  COVID TEST- n/a  Anesthesia review: Yes, hx of CAD. Heart clearance received on 10/23/21 via Epic.   Patient denies shortness of breath, fever, cough and chest pain at PAT appointment   All instructions explained to the patient, with a verbal understanding of the material. Patient agrees to go over the instructions while at home for a better understanding. Patient also instructed to self quarantine after being tested for COVID-19. The opportunity to ask questions was provided.

## 2021-11-03 NOTE — Anesthesia Preprocedure Evaluation (Addendum)
Anesthesia Evaluation  Patient identified by MRN, date of birth, ID band Patient awake    Reviewed: Allergy & Precautions, NPO status , Patient's Chart, lab work & pertinent test results  History of Anesthesia Complications Negative for: history of anesthetic complications  Airway Mallampati: II  TM Distance: >3 FB Neck ROM: Full    Dental  (+) Dental Advisory Given   Pulmonary sleep apnea and Continuous Positive Airway Pressure Ventilation ,    Pulmonary exam normal        Cardiovascular METS: 5 - 7 Mets hypertension, + CAD, + Cardiac Stents and +CHF  Normal cardiovascular exam   Echo 03/28/20: EF 45-50%, no RWMA, mild LVH, g1dd, normal RVSF, trivial MR, aortic root 41 mm, ascending aorta 41 mm  MPS 2019: EF 41%, findings c/w prior MI, no ischemia, low risk study   Neuro/Psych    GI/Hepatic Neg liver ROS, GERD  ,  Endo/Other  diabetes  Renal/GU negative Renal ROS  negative genitourinary   Musculoskeletal negative musculoskeletal ROS (+)   Abdominal   Peds  Hematology negative hematology ROS (+)   Anesthesia Other Findings   Reproductive/Obstetrics                            Anesthesia Physical Anesthesia Plan  ASA: 3  Anesthesia Plan: General   Post-op Pain Management: Ofirmev IV (intra-op)* and Toradol IV (intra-op)*   Induction: Intravenous  PONV Risk Score and Plan: 2 and Ondansetron, Dexamethasone, Treatment may vary due to age or medical condition and Midazolam  Airway Management Planned: Oral ETT  Additional Equipment: None  Intra-op Plan:   Post-operative Plan: Extubation in OR  Informed Consent: I have reviewed the patients History and Physical, chart, labs and discussed the procedure including the risks, benefits and alternatives for the proposed anesthesia with the patient or authorized representative who has indicated his/her understanding and acceptance.      Dental advisory given  Plan Discussed with:   Anesthesia Plan Comments: (PAT note by Karoline Caldwell, PA-C:  Follows with cardiology for history of CAD with prior anterior infarct treated with stent, chronic combined systolic and diastolic heart failure (EF 45-50% by echo 2021), HTN, HLD.  Seen by Ambrose Pancoast, NP 10/23/2021 for preop evaluation.  Per note, "Mr.Bayley's perioperative risk of a major cardiac event is6.6% according to the Revised Cardiac Risk Index (RCRI).Therefore,heis atincreasedrisk for perioperative complications. Hisfunctional capacity is goodat 5.07METs according to the Duke Activity Status Index (DASI).Mr. Hoefle has well-controlled blood pressure and his hemoglobin A1c has decreased to 6.9. He is free of any anginal complaints at this time. The patient affirmshehas been doing well without any new cardiac symptoms. They are able to achieve 5METS without cardiac limitations. Therefore, based on ACC/AHA guidelines, the patient would be at acceptable risk for the planned procedure without further cardiovascular testing. The patient was advised that if hedevelops new symptoms prior to surgery to contact our office to arrange for a follow-up visit, and heverbalized understanding.Recommendations: According to ACC/AHA guidelines, no further cardiovascular testing needed. The patient may proceed to surgery at acceptable risk. Antiplatelet and/or Anticoagulation Recommendations: Clopidogrel (Plavix) can be held for5-7days prior to hissurgery and resumed as soon as possible post op."  OSA, variable CPAP compliance.  Non-insulin-dependent DM2, A1c 6.8 on preop labs.  Remainder of preop labs unremarkable.  EKG 02/16/2021: NSR.  Rate 96.  LAD.  QS pattern V1-V5, no changes, consistent with prior.  Echo 03/28/2020 1. Left ventricular  ejection fraction, by estimation, is 45 to 50%. The  left ventricle has mildly reduced function. The left ventricle has no  regional wall  motion abnormalities. There is mild concentric hypertrophy  with focal moderate basal-septal  hypertrophy.Left ventricular diastolic parameters are consistent with  Grade I diastolic dysfunction (impaired relaxation).  2. Right ventricular systolic function is normal. The right ventricular  size is normal.  3. The mitral valve is normal in structure. Trivial mitral valve  regurgitation.  4. The aortic valve is tricuspid. There is mild calcification of the  aortic valve. There is mild thickening of the aortic valve. Aortic valve  regurgitation is not visualized.  5. Aortic dilatation noted. There is mild dilatation of the aortic root,  measuring 41 mm. There is mild to moderate dilatation of the ascending  aorta, measuring 41 mm.  6. The inferior vena cava is normal in size with <50% respiratory  variability, suggesting right atrial pressure of 8 mmHg  Myocardial Perfusion 04/28/2018 . Nuclear stress EF: 41%. . There was no ST segment deviation noted during stress. . Defect 1: There is a medium defect of severe severity present in the apical anterior, apical septal, apical inferior and apex location. . Findings consistent with prior myocardial infarction. . This is a low risk study secondary to LV dysfunction. . The left ventricular ejection fraction is moderately decreased (30-44%).  There is a medium size irreversible defect in the apical anterior, septal, inferior walls and and in the true apex consistent with prior apical infarction but no peri-infarct ischemia. LVEF 41%. )       Anesthesia Quick Evaluation

## 2021-11-03 NOTE — Progress Notes (Signed)
Anesthesia Chart Review:  Follows with cardiology for history of CAD with prior anterior infarct treated with stent, chronic combined systolic and diastolic heart failure (EF 45-50% by echo 2021), HTN, HLD.  Seen by Ambrose Pancoast, NP 10/23/2021 for preop evaluation.  Per note, "Mr. Kistner's perioperative risk of a major cardiac event is 6.6% according to the Revised Cardiac Risk Index (RCRI). Therefore, he is at increased risk for perioperative complications. His functional capacity is good at 5.07 METs according to the Duke Activity Status Index (DASI).Mr. Demelo has well-controlled blood pressure and his hemoglobin A1c has decreased to 6.9.  He is free of any anginal complaints at this time. The patient affirms he has been doing well without any new cardiac symptoms. They are able to achieve 5 METS without cardiac limitations. Therefore, based on ACC/AHA guidelines, the patient would be at acceptable risk for the planned procedure without further cardiovascular testing. The patient was advised that if he develops new symptoms prior to surgery to contact our office to arrange for a follow-up visit, and he verbalized understanding.Recommendations: According to ACC/AHA guidelines, no further cardiovascular testing needed.  The patient may proceed to surgery at acceptable risk. Antiplatelet and/or Anticoagulation Recommendations: Clopidogrel (Plavix) can be held for 5-7 days prior to his surgery and resumed as soon as possible post op."  OSA, variable CPAP compliance.  Non-insulin-dependent DM2, A1c 6.8 on preop labs.  Remainder of preop labs unremarkable.  EKG 02/16/2021: NSR.  Rate 96.  LAD.  QS pattern V1-V5, no changes, consistent with prior.  Echo 03/28/2020  1. Left ventricular ejection fraction, by estimation, is 45 to 50%. The  left ventricle has mildly reduced function. The left ventricle has no  regional wall motion abnormalities. There is mild concentric hypertrophy  with focal moderate  basal-septal  hypertrophy.Left ventricular diastolic parameters are consistent with  Grade I diastolic dysfunction (impaired relaxation).   2. Right ventricular systolic function is normal. The right ventricular  size is normal.   3. The mitral valve is normal in structure. Trivial mitral valve  regurgitation.   4. The aortic valve is tricuspid. There is mild calcification of the  aortic valve. There is mild thickening of the aortic valve. Aortic valve  regurgitation is not visualized.   5. Aortic dilatation noted. There is mild dilatation of the aortic root,  measuring 41 mm. There is mild to moderate dilatation of the ascending  aorta, measuring 41 mm.   6. The inferior vena cava is normal in size with <50% respiratory  variability, suggesting right atrial pressure of 8 mmHg   Myocardial Perfusion 04/28/2018 Nuclear stress EF: 41%. There was no ST segment deviation noted during stress. Defect 1: There is a medium defect of severe severity present in the apical anterior, apical septal, apical inferior and apex location. Findings consistent with prior myocardial infarction. This is a low risk study secondary to LV dysfunction. The left ventricular ejection fraction is moderately decreased (30-44%).   There is a medium size irreversible defect in the apical anterior, septal, inferior walls and and in the true apex consistent with prior apical infarction but no peri-infarct ischemia. LVEF 41%.     Wynonia Musty [facility] Short Stay Center/Anesthesiology Phone [phone] 11/03/2021 3:31 PM

## 2021-11-06 DIAGNOSIS — M542 Cervicalgia: Secondary | ICD-10-CM | POA: Diagnosis not present

## 2021-11-09 ENCOUNTER — Ambulatory Visit (HOSPITAL_COMMUNITY): Payer: 59

## 2021-11-09 ENCOUNTER — Encounter (HOSPITAL_COMMUNITY): Payer: Self-pay | Admitting: Orthopedic Surgery

## 2021-11-09 ENCOUNTER — Ambulatory Visit (HOSPITAL_COMMUNITY): Payer: 59 | Admitting: Physician Assistant

## 2021-11-09 ENCOUNTER — Encounter (HOSPITAL_COMMUNITY)
Admission: RE | Disposition: A | Discharge status: Home / Self Care | Payer: Self-pay | Source: Home / Self Care | Attending: Orthopedic Surgery

## 2021-11-09 ENCOUNTER — Observation Stay (HOSPITAL_COMMUNITY)
Admission: RE | Admit: 2021-11-09 | Discharge: 2021-11-11 | Disposition: A | Discharge status: Home / Self Care | Payer: 59 | Attending: Orthopedic Surgery | Admitting: Orthopedic Surgery

## 2021-11-09 ENCOUNTER — Other Ambulatory Visit: Payer: Self-pay

## 2021-11-09 ENCOUNTER — Ambulatory Visit (HOSPITAL_BASED_OUTPATIENT_CLINIC_OR_DEPARTMENT_OTHER): Payer: 59 | Admitting: Anesthesiology

## 2021-11-09 DIAGNOSIS — I110 Hypertensive heart disease with heart failure: Secondary | ICD-10-CM | POA: Diagnosis not present

## 2021-11-09 DIAGNOSIS — M4712 Other spondylosis with myelopathy, cervical region: Principal | ICD-10-CM | POA: Insufficient documentation

## 2021-11-09 DIAGNOSIS — I2510 Atherosclerotic heart disease of native coronary artery without angina pectoris: Secondary | ICD-10-CM

## 2021-11-09 DIAGNOSIS — Z79899 Other long term (current) drug therapy: Secondary | ICD-10-CM | POA: Insufficient documentation

## 2021-11-09 DIAGNOSIS — Z981 Arthrodesis status: Secondary | ICD-10-CM | POA: Diagnosis not present

## 2021-11-09 DIAGNOSIS — Z7902 Long term (current) use of antithrombotics/antiplatelets: Secondary | ICD-10-CM | POA: Diagnosis not present

## 2021-11-09 DIAGNOSIS — M4722 Other spondylosis with radiculopathy, cervical region: Secondary | ICD-10-CM

## 2021-11-09 DIAGNOSIS — I5042 Chronic combined systolic (congestive) and diastolic (congestive) heart failure: Secondary | ICD-10-CM | POA: Insufficient documentation

## 2021-11-09 DIAGNOSIS — Z7984 Long term (current) use of oral hypoglycemic drugs: Secondary | ICD-10-CM | POA: Diagnosis not present

## 2021-11-09 DIAGNOSIS — M4802 Spinal stenosis, cervical region: Secondary | ICD-10-CM | POA: Diagnosis not present

## 2021-11-09 DIAGNOSIS — G959 Disease of spinal cord, unspecified: Principal | ICD-10-CM | POA: Diagnosis present

## 2021-11-09 DIAGNOSIS — E119 Type 2 diabetes mellitus without complications: Secondary | ICD-10-CM | POA: Diagnosis not present

## 2021-11-09 DIAGNOSIS — I509 Heart failure, unspecified: Secondary | ICD-10-CM | POA: Diagnosis not present

## 2021-11-09 HISTORY — PX: ANTERIOR CERVICAL DECOMP/DISCECTOMY FUSION: SHX1161

## 2021-11-09 LAB — GLUCOSE, CAPILLARY
Glucose-Capillary: 159 mg/dL — ABNORMAL HIGH (ref 70–99)
Glucose-Capillary: 127 mg/dL — ABNORMAL HIGH (ref 70–99)
Glucose-Capillary: 160 mg/dL — ABNORMAL HIGH (ref 70–99)

## 2021-11-09 SURGERY — ANTERIOR CERVICAL DECOMPRESSION/DISCECTOMY FUSION 3 LEVELS
Anesthesia: General | Site: Neck | Laterality: Left

## 2021-11-09 MED ORDER — HEMOSTATIC AGENTS (NO CHARGE) OPTIME
TOPICAL | Status: DC | PRN
  Administered 2021-11-09 (×2): 1 via TOPICAL

## 2021-11-09 MED ORDER — SODIUM CHLORIDE 0.9 % IV SOLN
INTRAVENOUS | Status: DC
Start: 2021-11-09 — End: 2021-11-11

## 2021-11-09 MED ORDER — TRIAMTERENE-HCTZ 37.5-25 MG PO TABS
1.0000 | ORAL_TABLET | Freq: Every morning | ORAL | Status: DC
Start: 2021-11-10 — End: 2021-11-11
  Administered 2021-11-10 – 2021-11-11 (×2): 1 via ORAL
  Filled 2021-11-09 (×2): qty 1

## 2021-11-09 MED ORDER — KETOROLAC TROMETHAMINE 30 MG/ML IJ SOLN
INTRAMUSCULAR | Status: DC | PRN
  Administered 2021-11-09: 30 mg via INTRAVENOUS

## 2021-11-09 MED ORDER — INSULIN ASPART 100 UNIT/ML IJ SOLN: 0.0000 [IU] | Freq: Every day | INTRAMUSCULAR | Status: DC

## 2021-11-09 MED ORDER — PROPOFOL 10 MG/ML IV BOLUS
INTRAVENOUS | Status: DC | PRN
  Administered 2021-11-09: 50 mg via INTRAVENOUS
  Administered 2021-11-09: 150 mg via INTRAVENOUS
  Administered 2021-11-09: 20 mg via INTRAVENOUS

## 2021-11-09 MED ORDER — DAPAGLIFLOZIN PROPANEDIOL 10 MG PO TABS
10.0000 mg | ORAL_TABLET | Freq: Every day | ORAL | Status: DC
  Administered 2021-11-09 – 2021-11-11 (×3): 10 mg via ORAL
  Filled 2021-11-09 (×3): qty 1

## 2021-11-09 MED ORDER — FENTANYL CITRATE (PF) 250 MCG/5ML IJ SOLN
INTRAMUSCULAR | Status: AC
  Filled 2021-11-09: qty 5

## 2021-11-09 MED ORDER — GABAPENTIN 300 MG PO CAPS: 300.0000 mg | ORAL_CAPSULE | Freq: Every day | ORAL | Status: DC | PRN

## 2021-11-09 MED ORDER — FLEET ENEMA 7-19 GM/118ML RE ENEM: 1.0000 | ENEMA | Freq: Once | RECTAL | Status: DC | PRN

## 2021-11-09 MED ORDER — THROMBIN 20000 UNITS EX SOLR
CUTANEOUS | Status: DC | PRN
  Administered 2021-11-09: 20 mL via TOPICAL

## 2021-11-09 MED ORDER — PHENYLEPHRINE 80 MCG/ML (10ML) SYRINGE FOR IV PUSH (FOR BLOOD PRESSURE SUPPORT)
PREFILLED_SYRINGE | INTRAVENOUS | Status: AC
  Filled 2021-11-09: qty 30

## 2021-11-09 MED ORDER — AMISULPRIDE (ANTIEMETIC) 5 MG/2ML IV SOLN: 10.0000 mg | Freq: Once | INTRAVENOUS | Status: DC | PRN

## 2021-11-09 MED ORDER — TRIAMTERENE-HCTZ 37.5-25 MG PO CAPS
1.0000 | ORAL_CAPSULE | Freq: Every morning | ORAL | Status: DC
  Filled 2021-11-09: qty 1

## 2021-11-09 MED ORDER — AMLODIPINE BESYLATE 5 MG PO TABS
5.0000 mg | ORAL_TABLET | Freq: Every day | ORAL | Status: DC
  Administered 2021-11-10 – 2021-11-11 (×2): 5 mg via ORAL
  Filled 2021-11-09 (×2): qty 1

## 2021-11-09 MED ORDER — ACETAMINOPHEN 10 MG/ML IV SOLN
INTRAVENOUS | Status: DC | PRN
  Administered 2021-11-09: 1000 mg via INTRAVENOUS

## 2021-11-09 MED ORDER — PROPOFOL 500 MG/50ML IV EMUL
INTRAVENOUS | Status: DC | PRN
  Administered 2021-11-09: 100 ug/kg/min via INTRAVENOUS
  Administered 2021-11-09: 75 ug/kg/min via INTRAVENOUS

## 2021-11-09 MED ORDER — METHOCARBAMOL 500 MG PO TABS: 500.0000 mg | ORAL_TABLET | Freq: Three times a day (TID) | ORAL | 0 refills | Status: AC | PRN

## 2021-11-09 MED ORDER — OXYCODONE HCL 5 MG PO TABS
5.0000 mg | ORAL_TABLET | ORAL | Status: DC | PRN
  Administered 2021-11-09: 5 mg via ORAL
  Filled 2021-11-09: qty 1

## 2021-11-09 MED ORDER — ACETAMINOPHEN 650 MG RE SUPP: 650.0000 mg | RECTAL | Status: DC | PRN

## 2021-11-09 MED ORDER — SUCCINYLCHOLINE CHLORIDE 200 MG/10ML IV SOSY
PREFILLED_SYRINGE | INTRAVENOUS | Status: AC
Start: 2021-11-09 — End: ?
  Filled 2021-11-09: qty 10

## 2021-11-09 MED ORDER — MIDAZOLAM HCL 2 MG/2ML IJ SOLN
INTRAMUSCULAR | Status: DC | PRN
  Administered 2021-11-09: 2 mg via INTRAVENOUS

## 2021-11-09 MED ORDER — OXYCODONE-ACETAMINOPHEN 10-325 MG PO TABS: 1.0000 | ORAL_TABLET | Freq: Four times a day (QID) | ORAL | 0 refills | Status: AC | PRN

## 2021-11-09 MED ORDER — EPHEDRINE 5 MG/ML INJ
INTRAVENOUS | Status: AC
  Filled 2021-11-09: qty 5

## 2021-11-09 MED ORDER — ONDANSETRON HCL 4 MG/2ML IJ SOLN: 4.0000 mg | Freq: Once | INTRAMUSCULAR | Status: DC | PRN

## 2021-11-09 MED ORDER — HYDROMORPHONE HCL 1 MG/ML IJ SOLN
INTRAMUSCULAR | Status: AC
  Filled 2021-11-09: qty 0.5

## 2021-11-09 MED ORDER — PHENYLEPHRINE HCL-NACL 20-0.9 MG/250ML-% IV SOLN
INTRAVENOUS | Status: DC | PRN
  Administered 2021-11-09: 20 ug/min via INTRAVENOUS

## 2021-11-09 MED ORDER — ONDANSETRON HCL 4 MG PO TABS: 4.0000 mg | ORAL_TABLET | Freq: Four times a day (QID) | ORAL | Status: DC | PRN

## 2021-11-09 MED ORDER — METHOCARBAMOL 500 MG PO TABS
500.0000 mg | ORAL_TABLET | Freq: Four times a day (QID) | ORAL | Status: DC | PRN
  Administered 2021-11-09 – 2021-11-11 (×4): 500 mg via ORAL
  Filled 2021-11-09 (×4): qty 1

## 2021-11-09 MED ORDER — POLYETHYLENE GLYCOL 3350 17 G PO PACK: 17.0000 g | PACK | Freq: Every day | ORAL | Status: DC | PRN

## 2021-11-09 MED ORDER — ROCURONIUM BROMIDE 10 MG/ML (PF) SYRINGE
PREFILLED_SYRINGE | INTRAVENOUS | Status: AC
Start: 2021-11-09 — End: ?
  Filled 2021-11-09: qty 30

## 2021-11-09 MED ORDER — ONDANSETRON HCL 4 MG/2ML IJ SOLN: 4.0000 mg | Freq: Four times a day (QID) | INTRAMUSCULAR | Status: DC | PRN

## 2021-11-09 MED ORDER — DEXAMETHASONE SODIUM PHOSPHATE 10 MG/ML IJ SOLN
INTRAMUSCULAR | Status: AC
Start: 2021-11-09 — End: ?
  Filled 2021-11-09: qty 1

## 2021-11-09 MED ORDER — CARVEDILOL 25 MG PO TABS
25.0000 mg | ORAL_TABLET | Freq: Two times a day (BID) | ORAL | Status: DC
  Administered 2021-11-10 – 2021-11-11 (×3): 25 mg via ORAL
  Filled 2021-11-09 (×3): qty 1

## 2021-11-09 MED ORDER — OXYCODONE HCL 5 MG PO TABS: 5.0000 mg | ORAL_TABLET | Freq: Once | ORAL | Status: AC | PRN

## 2021-11-09 MED ORDER — SODIUM CHLORIDE 0.9% FLUSH: 3.0000 mL | Freq: Two times a day (BID) | INTRAVENOUS | Status: DC

## 2021-11-09 MED ORDER — SODIUM CHLORIDE 0.9% FLUSH: 3.0000 mL | INTRAVENOUS | Status: DC | PRN

## 2021-11-09 MED ORDER — TRANEXAMIC ACID-NACL 1000-0.7 MG/100ML-% IV SOLN
1000.0000 mg | INTRAVENOUS | Status: AC
  Administered 2021-11-09: 1000 mg via INTRAVENOUS

## 2021-11-09 MED ORDER — CEFAZOLIN SODIUM-DEXTROSE 2-4 GM/100ML-% IV SOLN
INTRAVENOUS | Status: AC
  Filled 2021-11-09: qty 100

## 2021-11-09 MED ORDER — OXYCODONE HCL 5 MG/5ML PO SOLN
ORAL | Status: AC
  Filled 2021-11-09: qty 5

## 2021-11-09 MED ORDER — CHLORHEXIDINE GLUCONATE 0.12 % MT SOLN
15.0000 mL | Freq: Once | OROMUCOSAL | Status: AC
  Administered 2021-11-09: 15 mL via OROMUCOSAL
  Filled 2021-11-09: qty 15

## 2021-11-09 MED ORDER — HYDROMORPHONE HCL 1 MG/ML IJ SOLN
INTRAMUSCULAR | Status: AC
  Filled 2021-11-09: qty 1

## 2021-11-09 MED ORDER — ONDANSETRON HCL 4 MG/2ML IJ SOLN
INTRAMUSCULAR | Status: AC
Start: 2021-11-09 — End: ?
  Filled 2021-11-09: qty 4

## 2021-11-09 MED ORDER — METHOCARBAMOL 1000 MG/10ML IJ SOLN: 500.0000 mg | Freq: Four times a day (QID) | INTRAVENOUS | Status: DC | PRN

## 2021-11-09 MED ORDER — CEFAZOLIN SODIUM-DEXTROSE 1-4 GM/50ML-% IV SOLN
1.0000 g | Freq: Three times a day (TID) | INTRAVENOUS | Status: AC
  Administered 2021-11-09 – 2021-11-10 (×2): 1 g via INTRAVENOUS
  Filled 2021-11-09 (×2): qty 50

## 2021-11-09 MED ORDER — FENTANYL CITRATE (PF) 250 MCG/5ML IJ SOLN
INTRAMUSCULAR | Status: DC | PRN
  Administered 2021-11-09: 50 ug via INTRAVENOUS
  Administered 2021-11-09: 100 ug via INTRAVENOUS
  Administered 2021-11-09: 50 ug via INTRAVENOUS
  Administered 2021-11-09: 25 ug via INTRAVENOUS
  Administered 2021-11-09: 50 ug via INTRAVENOUS
  Administered 2021-11-09: 25 ug via INTRAVENOUS

## 2021-11-09 MED ORDER — LIDOCAINE 2% (20 MG/ML) 5 ML SYRINGE
INTRAMUSCULAR | Status: DC | PRN
  Administered 2021-11-09: 100 mg via INTRAVENOUS

## 2021-11-09 MED ORDER — ONDANSETRON HCL 4 MG PO TABS: 4.0000 mg | ORAL_TABLET | Freq: Three times a day (TID) | ORAL | 0 refills | Status: DC | PRN

## 2021-11-09 MED ORDER — HYDROMORPHONE HCL 1 MG/ML IJ SOLN
0.2500 mg | INTRAMUSCULAR | Status: DC | PRN
  Administered 2021-11-09 (×2): 0.5 mg via INTRAVENOUS
  Administered 2021-11-09: 0.25 mg via INTRAVENOUS

## 2021-11-09 MED ORDER — LACTATED RINGERS IV SOLN
INTRAVENOUS | Status: DC
Start: 2021-11-09 — End: 2021-11-11

## 2021-11-09 MED ORDER — MIDAZOLAM HCL 2 MG/2ML IJ SOLN
INTRAMUSCULAR | Status: AC
  Filled 2021-11-09: qty 2

## 2021-11-09 MED ORDER — RAMIPRIL 5 MG PO CAPS
10.0000 mg | ORAL_CAPSULE | Freq: Two times a day (BID) | ORAL | Status: DC
  Administered 2021-11-09 – 2021-11-11 (×4): 10 mg via ORAL
  Filled 2021-11-09 (×4): qty 2

## 2021-11-09 MED ORDER — BUPIVACAINE-EPINEPHRINE 0.25% -1:200000 IJ SOLN
INTRAMUSCULAR | Status: DC | PRN
  Administered 2021-11-09: 6 mL

## 2021-11-09 MED ORDER — PHENYLEPHRINE 80 MCG/ML (10ML) SYRINGE FOR IV PUSH (FOR BLOOD PRESSURE SUPPORT)
PREFILLED_SYRINGE | INTRAVENOUS | Status: DC | PRN
  Administered 2021-11-09: 80 ug via INTRAVENOUS
  Administered 2021-11-09: 160 ug via INTRAVENOUS
  Administered 2021-11-09 (×2): 80 ug via INTRAVENOUS
  Administered 2021-11-09: 40 ug via INTRAVENOUS
  Administered 2021-11-09 (×2): 80 ug via INTRAVENOUS
  Administered 2021-11-09 (×3): 160 ug via INTRAVENOUS
  Administered 2021-11-09: 80 ug via INTRAVENOUS

## 2021-11-09 MED ORDER — TRANEXAMIC ACID-NACL 1000-0.7 MG/100ML-% IV SOLN
INTRAVENOUS | Status: AC
  Filled 2021-11-09: qty 100

## 2021-11-09 MED ORDER — ORAL CARE MOUTH RINSE: 15.0000 mL | Freq: Once | OROMUCOSAL | Status: AC

## 2021-11-09 MED ORDER — PROPOFOL 10 MG/ML IV BOLUS
INTRAVENOUS | Status: AC
  Filled 2021-11-09: qty 20

## 2021-11-09 MED ORDER — OXYCODONE HCL 5 MG/5ML PO SOLN
5.0000 mg | Freq: Once | ORAL | Status: AC | PRN
  Administered 2021-11-09: 5 mg via ORAL

## 2021-11-09 MED ORDER — SUCCINYLCHOLINE CHLORIDE 200 MG/10ML IV SOSY
PREFILLED_SYRINGE | INTRAVENOUS | Status: DC | PRN
  Administered 2021-11-09: 100 mg via INTRAVENOUS

## 2021-11-09 MED ORDER — PROPOFOL 1000 MG/100ML IV EMUL
INTRAVENOUS | Status: AC
Start: 2021-11-09 — End: ?
  Filled 2021-11-09: qty 100

## 2021-11-09 MED ORDER — ACETAMINOPHEN 325 MG PO TABS
650.0000 mg | ORAL_TABLET | ORAL | Status: DC | PRN
  Administered 2021-11-10 (×2): 650 mg via ORAL
  Filled 2021-11-09 (×2): qty 2

## 2021-11-09 MED ORDER — CEFAZOLIN SODIUM-DEXTROSE 2-4 GM/100ML-% IV SOLN
2.0000 g | INTRAVENOUS | Status: AC
  Administered 2021-11-09 (×2): 2 g via INTRAVENOUS

## 2021-11-09 MED ORDER — METFORMIN HCL ER 500 MG PO TB24
1000.0000 mg | ORAL_TABLET | Freq: Two times a day (BID) | ORAL | Status: DC
  Administered 2021-11-09 – 2021-11-11 (×4): 1000 mg via ORAL
  Filled 2021-11-09 (×4): qty 2

## 2021-11-09 MED ORDER — LACTATED RINGERS IV SOLN: INTRAVENOUS | Status: DC

## 2021-11-09 MED ORDER — ACETAMINOPHEN 10 MG/ML IV SOLN
INTRAVENOUS | Status: AC
Start: 2021-11-09 — End: ?
  Filled 2021-11-09: qty 100

## 2021-11-09 MED ORDER — LIDOCAINE 2% (20 MG/ML) 5 ML SYRINGE
INTRAMUSCULAR | Status: AC
  Filled 2021-11-09: qty 10

## 2021-11-09 MED ORDER — 0.9 % SODIUM CHLORIDE (POUR BTL) OPTIME
TOPICAL | Status: DC | PRN
  Administered 2021-11-09: 1000 mL

## 2021-11-09 MED ORDER — PHENOL 1.4 % MT LIQD
1.0000 | OROMUCOSAL | Status: DC | PRN
Start: 2021-11-09 — End: 2021-11-11

## 2021-11-09 MED ORDER — PROPOFOL 10 MG/ML IV BOLUS
INTRAVENOUS | Status: AC
Start: 2021-11-09 — End: ?
  Filled 2021-11-09: qty 20

## 2021-11-09 MED ORDER — INSULIN ASPART 100 UNIT/ML IJ SOLN
0.0000 [IU] | Freq: Three times a day (TID) | INTRAMUSCULAR | Status: DC
  Administered 2021-11-10 (×2): 2 [IU] via SUBCUTANEOUS
  Administered 2021-11-10: 3 [IU] via SUBCUTANEOUS

## 2021-11-09 MED ORDER — MENTHOL 3 MG MT LOZG
1.0000 | LOZENGE | OROMUCOSAL | Status: DC | PRN
  Filled 2021-11-09: qty 9

## 2021-11-09 MED ORDER — OXYCODONE HCL 5 MG PO TABS
10.0000 mg | ORAL_TABLET | ORAL | Status: DC | PRN
  Administered 2021-11-10 – 2021-11-11 (×6): 10 mg via ORAL
  Filled 2021-11-09 (×6): qty 2

## 2021-11-09 MED ORDER — HYDROMORPHONE HCL 1 MG/ML IJ SOLN: 1.0000 mg | INTRAMUSCULAR | Status: AC | PRN

## 2021-11-09 MED ORDER — BUPIVACAINE-EPINEPHRINE (PF) 0.25% -1:200000 IJ SOLN
INTRAMUSCULAR | Status: AC
  Filled 2021-11-09: qty 30

## 2021-11-09 SURGICAL SUPPLY — 77 items
AGENT HMST KT MTR STRL THRMB (HEMOSTASIS) ×2
BAG COUNTER SPONGE SURGICOUNT (BAG) IMPLANT
BAG SPNG CNTER NS LX DISP (BAG)
BLADE CLIPPER SURG (BLADE) IMPLANT
BUR EGG ELITE 4.0 (BURR) IMPLANT
BUR MATCHSTICK NEURO 3.0 LAGG (BURR) IMPLANT
CABLE BIPOLOR RESECTION CORD (MISCELLANEOUS) ×2 IMPLANT
CANISTER SUCT 3000ML PPV (MISCELLANEOUS) ×2 IMPLANT
CLSR STERI-STRIP ANTIMIC 1/2X4 (GAUZE/BANDAGES/DRESSINGS) ×2 IMPLANT
COVER MAYO STAND STRL (DRAPES) ×6 IMPLANT
COVER PLATE (Plate) ×3 IMPLANT
COVER SURGICAL LIGHT HANDLE (MISCELLANEOUS) ×4 IMPLANT
DEVICE FUSION NANLCK 6 DEG 6 (Screw) IMPLANT
DRAIN CHANNEL 15F RND FF W/TCR (WOUND CARE) IMPLANT
DRAPE C-ARM 42X72 X-RAY (DRAPES) ×2 IMPLANT
DRAPE POUCH INSTRU U-SHP 10X18 (DRAPES) ×2 IMPLANT
DRAPE SURG 17X23 STRL (DRAPES) ×2 IMPLANT
DRAPE U-SHAPE 47X51 STRL (DRAPES) ×2 IMPLANT
DRSG OPSITE POSTOP 4X6 (GAUZE/BANDAGES/DRESSINGS) ×2 IMPLANT
DURAPREP 26ML APPLICATOR (WOUND CARE) ×2 IMPLANT
ELECT COATED BLADE 2.86 ST (ELECTRODE) ×2 IMPLANT
ELECT PENCIL ROCKER SW 15FT (MISCELLANEOUS) ×2 IMPLANT
ELECT REM PT RETURN 9FT ADLT (ELECTROSURGICAL) ×2
ELECTRODE REM PT RTRN 9FT ADLT (ELECTROSURGICAL) ×1 IMPLANT
FEE INTRAOP CADWELL SUPPLY NCS (MISCELLANEOUS) IMPLANT
FEE INTRAOP MONITOR IMPULS NCS (MISCELLANEOUS) IMPLANT
FUSION TCS NANOLOCK 6 DEG 6 (Screw) ×6 IMPLANT
GLOVE BIO SURGEON STRL SZ 6.5 (GLOVE) ×2 IMPLANT
GLOVE BIOGEL PI IND STRL 6.5 (GLOVE) ×1 IMPLANT
GLOVE BIOGEL PI IND STRL 8.5 (GLOVE) ×1 IMPLANT
GLOVE BIOGEL PI INDICATOR 6.5 (GLOVE) ×1
GLOVE BIOGEL PI INDICATOR 8.5 (GLOVE) ×1
GLOVE SS BIOGEL STRL SZ 8.5 (GLOVE) ×1 IMPLANT
GLOVE SUPERSENSE BIOGEL SZ 8.5 (GLOVE) ×1
GOWN STRL REUS W/ TWL LRG LVL3 (GOWN DISPOSABLE) ×2 IMPLANT
GOWN STRL REUS W/TWL 2XL LVL3 (GOWN DISPOSABLE) ×4 IMPLANT
GOWN STRL REUS W/TWL LRG LVL3 (GOWN DISPOSABLE) ×4
INTRAOP CADWELL SUPPLY FEE NCS (MISCELLANEOUS) ×1
INTRAOP DISP SUPPLY FEE NCS (MISCELLANEOUS) ×2
INTRAOP MONITOR FEE IMPULS NCS (MISCELLANEOUS) ×1
INTRAOP MONITOR FEE IMPULSE (MISCELLANEOUS) ×2
KIT BASIN OR (CUSTOM PROCEDURE TRAY) ×2 IMPLANT
KIT TURNOVER KIT B (KITS) ×2 IMPLANT
NDL SPNL 18GX3.5 QUINCKE PK (NEEDLE) ×1 IMPLANT
NEEDLE HYPO 22GX1.5 SAFETY (NEEDLE) ×2 IMPLANT
NEEDLE SPNL 18GX3.5 QUINCKE PK (NEEDLE) ×4 IMPLANT
NS IRRIG 1000ML POUR BTL (IV SOLUTION) ×2 IMPLANT
PACK ORTHO CERVICAL (CUSTOM PROCEDURE TRAY) ×2 IMPLANT
PACK UNIVERSAL I (CUSTOM PROCEDURE TRAY) ×2 IMPLANT
PAD ARMBOARD 7.5X6 YLW CONV (MISCELLANEOUS) ×6 IMPLANT
PATTIES SURGICAL .25X.25 (GAUZE/BANDAGES/DRESSINGS) ×2 IMPLANT
PATTIES SURGICAL .5 X.5 (GAUZE/BANDAGES/DRESSINGS) IMPLANT
PLATE LOCK ENDO TCS (Plate) ×3 IMPLANT
PLATE LOCK ENDO TCS F/COVER (Plate) IMPLANT
POSITIONER HEAD DONUT 9IN (MISCELLANEOUS) ×2 IMPLANT
PUTTY BONE DBX 2.5 MIS (Bone Implant) ×1 IMPLANT
RESTRAINT LIMB HOLDER UNIV (RESTRAINTS) ×2 IMPLANT
SCREW ENDO BONE 3.8X14MM (Screw) ×2 IMPLANT
SCREW LOCKING 14MMX3.5MM (Screw) ×4 IMPLANT
SPONGE INTESTINAL PEANUT (DISPOSABLE) ×4 IMPLANT
SPONGE SURGIFOAM ABS GEL 100 (HEMOSTASIS) ×2 IMPLANT
SPONGE T-LAP 4X18 ~~LOC~~+RFID (SPONGE) ×4 IMPLANT
SURGIFLO W/THROMBIN 8M KIT (HEMOSTASIS) ×2 IMPLANT
SUT BONE WAX W31G (SUTURE) ×2 IMPLANT
SUT MNCRL AB 3-0 PS2 27 (SUTURE) ×2 IMPLANT
SUT SILK 2 0 (SUTURE) ×2
SUT SILK 2-0 18XBRD TIE 12 (SUTURE) ×1 IMPLANT
SUT VIC AB 2-0 CT1 18 (SUTURE) ×2 IMPLANT
SYR BULB IRRIG 60ML STRL (SYRINGE) ×2 IMPLANT
SYR CONTROL 10ML LL (SYRINGE) ×2 IMPLANT
TAPE CLOTH 4X10 WHT NS (GAUZE/BANDAGES/DRESSINGS) ×2 IMPLANT
TAPE UMBILICAL COTTON 1/8X30 (MISCELLANEOUS) ×2 IMPLANT
TOWEL GREEN STERILE (TOWEL DISPOSABLE) ×2 IMPLANT
TOWEL GREEN STERILE FF (TOWEL DISPOSABLE) ×2 IMPLANT
TRAY FOLEY MTR SLVR 16FR STAT (SET/KITS/TRAYS/PACK) ×2 IMPLANT
WATER STERILE IRR 1000ML POUR (IV SOLUTION) ×2 IMPLANT
YANKAUER SUCT BULB TIP NO VENT (SUCTIONS) ×1 IMPLANT

## 2021-11-09 NOTE — Anesthesia Procedure Notes (Signed)
Arterial Line Insertion Start/End6/19/2023 11:45 AM, 11/09/2021 11:55 AM  Preanesthetic checklist: patient identified, IV checked, site marked, risks and benefits discussed, surgical consent, monitors and equipment checked, pre-op evaluation, timeout performed and anesthesia consent Right, radial was placed Catheter size: 20 G Hand hygiene performed  and maximum sterile barriers used   Attempts: 2 Procedure performed without using ultrasound guided technique. Following insertion, Biopatch and dressing applied. Post procedure assessment: normal  Patient tolerated the procedure well with no immediate complications.

## 2021-11-09 NOTE — Op Note (Signed)
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  6 mm lordotic small cage with appropriate locking screws and locking plate.  Graft: DBX mix  Neuromonitoring: No abnormal activity was noted throughout the case.  At the conclusion SSEPs were intact and the baseline motors remained poor but unchanged.  BRIEF HISTORY: Ryan Bombard, Ryan Walsh is a 70 y.o. male who presented to my office with complaints of significant neck and radicular arm pain as well as difficulty maintaining his balance and difficulty with fine motor control in her hands.  Imaging studies demonstrated cervical spondylitic myelopathy with cord signal changes at C3-4 and significant foraminal stenosis C4-5 C5-6.  There is also large anterior exostosis at multiple levels.  After discussing treatment options we elected to move forward with surgery.  All appropriate risks, benefits, alternatives were discussed and consent was obtained.  PROCEDURE DETAILS: Patient was brought into the operating room and was properly positioned on the operating room table.  After induction with general anesthesia the patient was endotracheally intubated.  A timeout was taken to confirm all important data: including patient, procedure, and the level. Teds, SCD's were applied.   The neuro monitoring representative placed all appropriate needles and pads for intraoperative SSEP and evoked motor monitoring.  Foley was also inserted.  The anterior cervical spine was prepped and draped in standard fashion.  Using fluoroscopy identified the C4 vertebral body and  marked out my transverse incision.  Incision site was infiltrated with quarter percent Marcaine with epinephrine and a transverse incision was made.  Sharp dissection was carried out down to and through the platysma.  I then continued dissecting along the medial border the sternocleidomastoid deep cervical fascia.  The omohyoid muscle was identified and isolated.  This was resected to improve visualization/retraction.  I continued sharply dissecting through the deep cervical and prevertebral fascia until I could visualize the anterior longitudinal ligament.  I then used my Kitner dissectors to complete the dissection to expose from the mid body of C3 to the mid body of C6.  Once I had this exposed hand-held retractors were placed and the needle was placed into the C3-4 disc space.  A lateral fluoroscopic view was used to confirm that I was at the appropriate level.  I then mobilized the longus coli muscle from the midportion of C3 down to the midportion of C6.  This was done bilaterally.  There were large anterior exostosis formation they were taken down with a double-action Leksell rongeur.  Hemostasis was obtained using bone wax.  Once I had the bony anterior cervical spine exposed I placed my Caspar retractors underneath the longus coli muscle and deflated the endotracheal cuff.  I expanded the retractor and reinflated the endotracheal cuff.  I now had excellent visualization of the C3-4 disc space.  After trimming down the anterior osteophyte with Kerrison rongeurs I was able to form an annulotomy.  Using pituitary rongeurs, and curettes I remove the bulk of the disc material.  Distraction pins were placed into the body of C3 and C4 and I used a lamina spreader to distract the intervertebral space and maintain  this with the distraction pin set.  I continue to use my curettes to work posteriorly until I could see the posterior annulus.  Careful dissection was now done with my nerve hook and #1 Kerrison  rongeur.  I resected the posterior osteophyte from the vertebral body of C4.  Once this was removed I could then gently use my nerve hook to dissect underneath the C3 osteophyte and created a plane 1 mm Kerrison.  I used a 1 mm Kerrison to resect this osteophyte.  This allowed me to then dissect through the remaining posterior annulus and posterior longitudinal ligament.  I now created a plane between the PLL and the thecal sac and exploited this with a 1 mm Kerrison rongeurs to resect the PLL.  This also allowed me to undercut the uncovertebral joints for a foraminal decompression.  At this point I could now see the anterior aspect of the thecal sac and easily pass my nerve hook along the posterior surface of the vertebral body of C3 and C4 and under the uncovertebral joints bilaterally.  At this point I was pleased by overall decompression.  I then used the trial implants and elected to use the 6 mm small implant.  The implant was obtained and packed with allograft and the endplates were rasped.  After final irrigation and visualization I then implanted the cage.  The cage was countersunk and properly situated.  I then used the awl to create a cortical defect to pass the locking screw.  There is a 14 mm length screw that went through the cage and into the C3 and C4 vertebral body.  Both screws had excellent purchase.  The anterior locking plate was then secured over the implant and tightened according manufacture standards.  At this point I reposition by distracting pins from C3 into C5 and placed the third into C6.  Using the same technique that I had used at C3-4 I performed an ACDF at C4-5 and C5-6.  At both levels annulotomy's were performed and using curettes and Kerrison rongeurs I took down the anterior osteophyte and exostosis and remove the bulk of the disc material.  I then continued working posteriorly removing the osteophyte from the posterior aspect of vertebral body.  I then gently dissected  through the posterior annulus and posterior longitudinal ligament and then resected the PLL with the 1 mm Kerrison.  This allowed me to decompress under the uncovertebral joint bilaterally.  I used fluoroscopic imaging to confirm I had adequate decompression by visualizing the position of my nerve hook underneath the uncovertebral joint and the ability to run it behind the vertebral body.  At this point with both discectomies completed I irrigated the wound copiously normal saline and then measured and placed a 6 mm small implants at both levels.  Again I used the DBX mix allograft.  Both implants were properly positioned and then secured with 14 mm locking screws.  The anterior locking plate was then secured.  At this point the distraction pins were removed hemostasis was obtained using bipolar electrocautery, bone wax, and Floseal.  Final neuro monitoring was completed and images were taken.  Images demonstrated satisfactory restoration of cervical lordosis and proper alignment of the cervical cages at each of the 3 levels of both planes.  After copious irrigation I checked again to ensure that hemostasis and then returned the trach and esophagus to midline.  The platysma was then closed with interrupted 2-0 Vicryl suture, and the skin with a 3-0 Monocryl.  Steri-Strips dry dressing and a collar were applied and the patient was ultimately extubated transfer the PACU without incident.  The end of the case all needle sponge counts were correct. Melina Schools, Ryan Walsh 11/09/2021 4:47 PM

## 2021-11-09 NOTE — Brief Op Note (Signed)
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  PRE-OPERATIVE DIAGNOSIS:  Cervical spondylotic myeloradiculopathy  POST-OPERATIVE DIAGNOSIS:  Cervical spondylotic myeloradiculopathy  PROCEDURE:  Procedure(s) with comments: ANTERIOR CERVICAL DECOMPRESSION/DISCECTOMY FUSION 3 LEVELS C3-C6 (Left) - 4 hrs 3 C-Bed  SURGEON:  Surgeon(s) and Role:    Melina Schools, MD - Primary  PHYSICIAN ASSISTANT:   ASSISTANTS: none   ANESTHESIA:   general  EBL:  50 mL   BLOOD ADMINISTERED:none  DRAINS: none   LOCAL MEDICATIONS USED:  MARCAINE     SPECIMEN:  No Specimen  DISPOSITION OF SPECIMEN:  PATHOLOGY  COUNTS:  YES  TOURNIQUET:  * No tourniquets in log *  DICTATION: .Dragon Dictation  PLAN OF CARE: Admit for overnight observation  PATIENT DISPOSITION:  PACU - hemodynamically stable.

## 2021-11-09 NOTE — Anesthesia Procedure Notes (Signed)
Procedure Name: Intubation Date/Time: 11/09/2021 11:45 AM  Performed by: Vonna Drafts, CRNAPre-anesthesia Checklist: Patient identified, Emergency Drugs available, Suction available and Patient being monitored Patient Re-evaluated:Patient Re-evaluated prior to induction Oxygen Delivery Method: Circle system utilized Preoxygenation: Pre-oxygenation with 100% oxygen Induction Type: IV induction Laryngoscope Size: Glidescope and 4 Grade View: Grade I Tube type: Oral Tube size: 7.5 mm Number of attempts: 1 Airway Equipment and Method: Stylet and Oral airway Placement Confirmation: ETT inserted through vocal cords under direct vision, positive ETCO2 and breath sounds checked- equal and bilateral Secured at: 22 cm Tube secured with: Tape Dental Injury: Teeth and Oropharynx as per pre-operative assessment

## 2021-11-09 NOTE — Discharge Instructions (Addendum)
° ° ° ° ° °Today you will be discharged from the hospital.  The purpose of the following handout is to help guide you over the next 2 weeks.  First and foremost, be sure you have a follow up appointment with Ryan Walsh 2 weeks from the time of your surgery to have your sutures removed.  Please call [facility] [phone] to schedule or confirm this appointment.   ° ° ° °Brace °You do not have to wear the collar while lying in bed or sitting in a high-backed chair, eating, sleeping or showering.  Other than these instances, you must wear the brace.  You may NOT wear the collar while driving a vehicle (see driving restrictions below).  It is advisable that you wear the collar in public places or while traveling in a car as a passenger.  Ryan Walsh will discuss further use of the collar at your 2 week postop visit. ° °Wound Care °You may SHOWER 5 days from the date of surgery.  Shower directly over the steri-strips.  DO NOT scrub or submerge (bath tub, swimming pool, hot tub, etc.) the area.  Pat to dry following your shower.  There is no need for additional dressings other than the steri-strips.  Allow the steri-strips to fall off on their own.  Once the strips have fallen off, you may leave the area undressed.  DO NOT apply lotion/cream/ointment to the area.  The wound must remain dry at all times other than while showering.  Ryan Walsh will remove your stiches at your first postop visit and give you additional instructions regarding wound care at that time.  ° °Activity °NO DRIVING FOR 2 WEEKS.  No lifting over 5 pounds (approximately a gallon of milk).  No bending, stooping, squatting or twisting.  No overhead activities.  We encourage you to walk (short distances and often throughout the day) as you can tolerate.  A good rule of thumb is to get up and move once or twice every hour.  You may go up and down stairs carefully.  As you continue to recover, Ryan Walsh will address and  adjust restrictions to your activities until no further restrictions are needed.  However, until your first postop visit, when Ryan Walsh can assess your recovery, you are to follow these instructions.  At the end of this document is a tentative outline of activities for up to 1 year.   ° ° ° ° °Medication °You will be discharged from the hospital with medication for pain, spasm, nausea and constipation.  You will be given enough medication to last until your first postop visit in 2 weeks.  Medications WILL NOT BE REFILLED EARLY; therefore, you are to take the medications only as directed.  If you have been given multiple prescriptions, please leave them with your pharmacy.  They can keep them on file for when you need them.  Medications that are lost or stolen WILL NOT be replaced.  We will address the need for continuing certain medications on an individual basis during your postop visit.  We ask that you avoid over the counter anti-inflammatory medications (Advil, Aleve, Motrin) for 3 months.   ° °What you can expect following neck surgery... °It is not uncommon to experience a sore throat or difficulty swallowing following neck surgery.  Cold liquids and soft foods are helpful in soothing this discomfort.  There is no specific diet that you are to follow after surgery, however, there are a   you should keep in mind to avoid unneeded discomfort.  Take small bites and eat slowly.  Chew your food thoroughly before swallowing.   It is not uncommon to experience incisional soreness or pain in the back of the neck, shoulders or between the shoulder blades.  These symptoms will slowly begin to resolve as you continue to recover, however, they can last for a few weeks.    It is not uncommon to experience INTERMITTENT arm pain following surgery.  This pain can mimic the arm pain you had prior to surgery.  As long as the pain resolves on its own and is not constant, there is no need to become alarmed.   When To  Call If you experience fever >101F, loss of bowel or bladder control, painful swelling in the lower extremities, constant (unresolving) arm pain.  If you experience any of these symptoms, please call [facility] [phone].  What's Next As mentioned earlier, you will follow up with Ryan Walsh in 2 weeks.  At that time, we will likely remove your stitches and discuss additional aspects of your recovery.    ACTIVITY GUIDELINES ANTERIOR CERVICAL DISECTOMY AND FUSION  Activity Discharge 2 weeks 6 weeks 3 months 6 months 1 year  Shower 5 days        Submerge the wound  no no yes     Walking outdoors yes       Lifting 5 lbs yes       Climbing stairs yes       Cooking yes       Car rides (less than 30 minutes) yes       Car rides (greater than 30 minutes) no varies yes     Air travel no varies yes     Short outings [employer], visits, etc...) yes       School no no yes     Driving a car no no varies yes    Light upper extremity exercises no no varies yes    Stationary bike no no yes     Swimming (no diving) no no no varies yes   Vacuuming, laundry, mopping no no no varies yes   Biking outdoors no no no no varies yes  Light jogging no no no varies yes   Low impact aerobics no no no varies yes   Non-contact sports (tennis, golf) no no no varies yes   Hunting (no tree climbing) no no no varies yes   Dancing (non-gymnastics) no no no varies yes   Down-hill skiing (experienced skier) no no no no yes   Down-hill skiing (novice) no no no no yes   Cross-country skiing no no no no yes   Horseback riding (noncompetitive)  no no no no yes   Horseback riding (competitive) no no no no varies yes  Gardening/landscaping no no no varies yes   House repairs no no no varies varies yes  Lifting up to 50 lbs no no no no varies yes      OK RESTART PLAVIX ON THRUSDAY MORNING  .

## 2021-11-09 NOTE — Transfer of Care (Signed)
Immediate Anesthesia Transfer of Care Note  Patient: Ryan Bombard, MD  Procedure(s) Performed: ANTERIOR CERVICAL DECOMPRESSION/DISCECTOMY FUSION 3 LEVELS C3-C6 (Left: Neck)  Patient Location: PACU  Anesthesia Type:General  Level of Consciousness: drowsy and patient cooperative  Airway & Oxygen Therapy: Patient Spontanous Breathing and Patient connected to nasal cannula oxygen  Post-op Assessment: Report given to RN, Post -op Vital signs reviewed and stable and Patient moving all extremities X 4  Post vital signs: Reviewed and stable  Last Vitals:  Vitals Value Taken Time  BP 111/87 11/09/21 1701  Temp    Pulse 100 11/09/21 1702  Resp 19 11/09/21 1702  SpO2 96 % 11/09/21 1702  Vitals shown include unvalidated device data.  Last Pain:  Vitals:   11/09/21 1044  TempSrc: Oral         Complications: No notable events documented.

## 2021-11-09 NOTE — H&P (Signed)
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 Patient states their pain level is 6/10. Loss in function is affecting activities of daily living as well as overall quality of life.  His clinical exam is consistent with neurological deficits in the upper extremity as well as signs and symptoms of myelopathy. I have gone over the imaging studies with him in great detail and explained the pathology to him.  Plan on a 3 level ACDF C3-6.  Past Medical History:  Diagnosis Date   Chronic combined systolic and diastolic CHF (congestive heart failure) (HCC)    Coronary atherosclerosis of native coronary artery    Dental crowns present    Essential hypertension, benign    states under control with meds., has been on med. x 15 yr.   GERD (gastroesophageal reflux disease)    Hepatitis    Drug induced hepatitis   MI (myocardial infarction) ([place]) 10/27/2002   Non-insulin dependent type 2 diabetes mellitus ([place])    Obesity    Osteoarthritis    hips and knees   Pure hypercholesterolemia    Sleep apnea     No Known Allergies  No current facility-administered medications on file prior to encounter.   Current Outpatient Medications on File Prior to Encounter  Medication Sig Dispense Refill   amLODipine (NORVASC) 5 MG tablet TAKE 1 TABLET BY MOUTH ONCE DAILY 90 tablet 2   atorvastatin (LIPITOR) 40 MG tablet TAKE 1 TABLET BY MOUTH AT BEDTIME 90 tablet 3   calcium carbonate (TUMS - DOSED IN MG ELEMENTAL CALCIUM) 500 MG chewable tablet Chew 1,000 mg by mouth daily as needed for indigestion or heartburn.     carvedilol (COREG) 25 MG tablet TAKE 1 TABLET BY MOUTH TWO TIMES DAILY WITH A MEAL 180 tablet 2   clopidogrel (PLAVIX) 75 MG tablet Take 1 tablet by mouth once daily 90 tablet 2   dapagliflozin propanediol (FARXIGA) 10 MG TABS tablet Take 1 tablet by mouth once daily 90  tablet 2   diphenhydrAMINE (SOMINEX) 25 MG tablet Take 50 mg by mouth at bedtime as needed for sleep.     Dulaglutide (TRULICITY) 3 AG/5.3MI SOPN Inject 3 mg Subcutaneously once a week 6 mL 2   gabapentin (NEURONTIN) 300 MG capsule Take 1 capsule by mouth 3 times a day for 2 weeks following surgery.Then take 1 capsule twice daily for 2 weeks. Then take 1 capsule once daily for 2 weeks. Then discontinue. (Patient taking differently: Take 300 mg by mouth daily as needed (pain).) 84 capsule 0   MELATONIN PO Take 15 mg by mouth at bedtime as needed (sleep).     metFORMIN (GLUCOPHAGE-XR) 500 MG 24 hr tablet Take 2 tablets by mouth twice daily (Patient taking differently: Take 1,000 mg by mouth 2 (two) times daily.) 360 tablet 2   naproxen sodium (ALEVE) 220 MG tablet Take 440 mg by mouth 2 (two) times daily as needed (pain).     ramipril (ALTACE) 10 MG capsule Take 1 capsule (10 mg total) by mouth 2 (two) times daily. 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  He is alert and orientated 3.  No shortness of breath, chest pain.  Abdomen is soft and non-tender,  negative loss of bowel and bladder control, no rebound tenderness.  Negative: skin lesions abrasions contusions  Peripheral pulses: 2+ dorsalis pedis/posterior tibial/radial artery pulses bilaterally. LE compartments are: Soft and nontender.  Gait pattern: Positive ataxic gait pattern with difficulty maintaining balance. Unable to heel toe ambulate. Positive Romberg's test  Assistive devices: Cane  Neuro: Positive Babinski test, negative Hoffman test, negative inverted brachioradialis reflex. 4/5 upper extremity bicep, deltoid, wrist extensor strength bilaterally. Trace weakness of the triceps bilaterally and grip. Positive numbness and  dysesthesias primarily in the right C5, C6 dermatome. Negative Spurling sign. Negative Lhermitte sign. No clonus.  Musculoskeletal: Significant neck pain and crepitus with range of motion. Occasional occipital headaches. Well-healed surgical scar from carpal tunnel release on the right side.  Imaging: Multilevel degenerative cervical disease primarily C3-6. Anterior exostosis formation C3-7 with autofusion at C6-7. Loss of normal cervical lordosis in the upper cervical spine.  Cervical MRI: completed on 09/19/2021: Reversal of normal cervical lordosis and grade 1 anterior listhesis C4-5 and C5-6. T2 signal change at C3-4 consistent with myelomalacia. There is disc space narrowing at C6-7 but no spinal canal or foraminal stenosis. Moderate right and severe left foraminal stenosis at C5-6. Severe bilateral foraminal stenosis C3-4 and C4-5.   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 Patient states their pain level is 6/10. Loss in function is affecting activities of daily living as well as overall quality of life.  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 His clinical exam is consistent with neurological deficits in the upper extremity as well as signs and symptoms of myelopathy. I have gone over the imaging studies with him in great detail and explained the pathology to him.  At this point time given the fact he has myelopathy and multilevel radiculopathy and recommending surgical intervention. Plan on a 3 level ACDF C3-6. While he does have trace weakness in the tricep there is no significant neural compression on imaging studies and this may be secondary to the myelopathy. The goal of surgery is to prevent worsening of the myelopathy, improved his motor function and possibly improve the sensory  deficits.  Risks and benefits of surgery were discussed with the patient. These include: Infection, bleeding, death, stroke, paralysis, ongoing or worse pain, need for additional surgery, nonunion, leak of spinal fluid, adjacent segment degeneration requiring additional fusion surgery. Pseudoarthrosis (nonunion)requiring supplemental posterior fixation. Throat pain, swallowing difficulties, hoarseness or change in voice.

## 2021-11-10 ENCOUNTER — Other Ambulatory Visit (HOSPITAL_COMMUNITY): Payer: Self-pay

## 2021-11-10 DIAGNOSIS — M4712 Other spondylosis with myelopathy, cervical region: Secondary | ICD-10-CM | POA: Diagnosis not present

## 2021-11-10 LAB — GLUCOSE, CAPILLARY
Glucose-Capillary: 128 mg/dL — ABNORMAL HIGH (ref 70–99)
Glucose-Capillary: 126 mg/dL — ABNORMAL HIGH (ref 70–99)
Glucose-Capillary: 167 mg/dL — ABNORMAL HIGH (ref 70–99)
Glucose-Capillary: 139 mg/dL — ABNORMAL HIGH (ref 70–99)

## 2021-11-10 MED ORDER — METHOCARBAMOL 500 MG PO TABS
500.0000 mg | ORAL_TABLET | Freq: Three times a day (TID) | ORAL | 0 refills | Status: DC | PRN
  Filled 2021-11-10: qty 15, 5d supply, fill #0

## 2021-11-10 MED ORDER — BISACODYL 5 MG PO TBEC
5.0000 mg | DELAYED_RELEASE_TABLET | Freq: Every day | ORAL | Status: DC | PRN
  Administered 2021-11-10: 5 mg via ORAL
  Filled 2021-11-10: qty 1

## 2021-11-10 MED ORDER — OXYCODONE-ACETAMINOPHEN 10-325 MG PO TABS
1.0000 | ORAL_TABLET | Freq: Four times a day (QID) | ORAL | 0 refills | Status: DC | PRN
  Filled 2021-11-10: qty 20, 5d supply, fill #0

## 2021-11-10 MED ORDER — ONDANSETRON HCL 4 MG PO TABS
4.0000 mg | ORAL_TABLET | Freq: Three times a day (TID) | ORAL | 0 refills | Status: DC | PRN
  Filled 2021-11-10: qty 20, 7d supply, fill #0

## 2021-11-10 NOTE — Progress Notes (Signed)
    Subjective: Procedure(s) (LRB): ANTERIOR CERVICAL DECOMPRESSION/DISCECTOMY FUSION 3 LEVELS C3-C6 (Left) 1 Day Post-Op  Patient reports pain as 1 on 0-10 scale.  Reports decreased arm pain reports incisional neck pain   Positive void Negative bowel movement Positive flatus Negative chest pain or shortness of breath  Objective: Vital signs in last 24 hours: Temp:  [97.1 F (36.2 C)-99.1 F (37.3 C)] 99.1 F (37.3 C) (06/20 0729) Pulse Rate:  [95-103] 100 (06/20 0729) Resp:  [12-31] 16 (06/20 0729) BP: (103-130)/(72-89) 105/76 (06/20 0729) SpO2:  [94 %-100 %] 96 % (06/20 0729) Weight:  [94.3 kg] 94.3 kg (06/19 1044)  Intake/Output from previous day: 06/19 0701 - 06/20 0700 In: 3650 [I.V.:3400; IV Piggyback:250] Out: 750 [Urine:700; Blood:50]  Labs: No results for input(s): "WBC", "RBC", "HCT", "PLT" in the last 72 hours. No results for input(s): "NA", "K", "CL", "CO2", "BUN", "CREATININE", "GLUCOSE", "CALCIUM" in the last 72 hours. No results for input(s): "LABPT", "INR" in the last 72 hours.  Physical Exam: Neurologically intact ABD soft Intact pulses distally Incision: dressing C/D/I and no drainage Compartment soft Body mass index is 34.61 kg/m.  Assessment/Plan: Patient stable  Mobilization with physical therapy Encourage incentive spirometry Continue care  Patient is doing well overall.  There is no swelling at the incision site, no shortness of breath, or difficulty swallowing. Plan on continuing mobilization with physical and Occupational Therapy today. Patient will restart his Plavix Thursday morning. Instructions and medications have been provided for discharge. I will be out of town till Wednesday evening and my partner will be covering for me if there is any issues.  Melina Schools, MD [facility] [phone]

## 2021-11-10 NOTE — Plan of Care (Signed)
  Problem: Education: Goal: Ability to describe self-care measures that may prevent or decrease complications (Diabetes Survival Skills Education) will improve Outcome: Completed/Met Goal: Individualized Educational Video(s) Outcome: Completed/Met   Problem: Coping: Goal: Ability to adjust to condition or change in health will improve Outcome: Completed/Met   Problem: Fluid Volume: Goal: Ability to maintain a balanced intake and output will improve Outcome: Completed/Met   Problem: Health Behavior/Discharge Planning: Goal: Ability to identify and utilize available resources and services will improve Outcome: Completed/Met Goal: Ability to manage health-related needs will improve Outcome: Completed/Met   Problem: Metabolic: Goal: Ability to maintain appropriate glucose levels will improve Outcome: Completed/Met   Problem: Nutritional: Goal: Maintenance of adequate nutrition will improve Outcome: Completed/Met Goal: Progress toward achieving an optimal weight will improve Outcome: Completed/Met   Problem: Skin Integrity: Goal: Risk for impaired skin integrity will decrease Outcome: Completed/Met   Problem: Tissue Perfusion: Goal: Adequacy of tissue perfusion will improve Outcome: Completed/Met   

## 2021-11-10 NOTE — Evaluation (Signed)
Occupational Therapy Evaluation Patient Details Name: Ryan PEARY, MD MRN: [id number] DOB: 1950-08-23 Today's Date: 11/10/2021   History of Present Illness 71 yo male s/p C3-6 ACDF on 6/19. PMH including CHF, hepatits, OA, DM type 2, HTN, MI, and obesity.   Clinical Impression   PTA, pt was living with his wife and performing ADLs and IADLs. Pt currently requiring Min-Max A for bathing and dressing and Min Guard A for functional mobility with RW.  Providing education on cervical precautions, bed mobility, grooming, UB ADLs, LB ADLs, toileting, and shower transfers. Pt continues to report decreased sensation at bilateral hands and limited strength at grasp and pinch. Pt would benefit from further acute OT to facilitate safe dc. Recommend dc to home once medically stable and follow up at OP for further OT to optimize safety, independence with ADLs, and return to PLOF.      Recommendations for follow up therapy are one component of a multi-disciplinary discharge planning process, led by the attending physician.  Recommendations may be updated based on patient status, additional functional criteria and insurance authorization.   Follow Up Recommendations  Follow physician's recommendations for discharge plan and follow up therapies (OP OT for BUE increase grasp, pinch, and fine motor skills)    Assistance Recommended at Discharge Frequent or constant Supervision/Assistance  Patient can return home with the following      Functional Status Assessment  Patient has had a recent decline in their functional status and demonstrates the ability to make significant improvements in function in a reasonable and predictable amount of time.  Equipment Recommendations  None recommended by OT    Recommendations for Other Services PT consult     Precautions / Restrictions Precautions Precautions: Cervical Precaution Booklet Issued: Yes (comment) Required Braces or Orthoses: Cervical  Brace Cervical Brace: Hard collar;At all times (Off for showers) Restrictions Weight Bearing Restrictions: No      Mobility Bed Mobility Overal bed mobility: Needs Assistance Bed Mobility: Rolling, Sidelying to Sit Rolling: Min guard Sidelying to sit: Min guard       General bed mobility comments: Cues for log roll    Transfers Overall transfer level: Needs assistance Equipment used: Rolling walker (2 wheels) Transfers: Sit to/from Stand Sit to Stand: Min guard           General transfer comment: MIn Guard A for safety      Balance Overall balance assessment: Needs assistance Sitting-balance support: No upper extremity supported, Feet supported Sitting balance-Leahy Scale: Fair     Standing balance support: No upper extremity supported, During functional activity Standing balance-Leahy Scale: Fair                             ADL either performed or assessed with clinical judgement   ADL Overall ADL's : Needs assistance/impaired Eating/Feeding: Set up;Supervision/ safety;Sitting   Grooming: Set up;Supervision/safety;Sitting   Upper Body Bathing: Moderate assistance;Sitting   Lower Body Bathing: Moderate assistance;Sit to/from stand   Upper Body Dressing : Min guard;Maximal assistance;Sitting Upper Body Dressing Details (indicate cue type and reason): Pt donning tshirt with Min Guard A and cues for sequencing. Use of compensatory techniques for adherance to cervical precautions. Max A for managing collar Lower Body Dressing: Minimal assistance;Sit to/from stand;Cueing for sequencing;With adaptive equipment;Maximal assistance Lower Body Dressing Details (indicate cue type and reason): Min A for managing underwear/pants. Educating on use of reacher for donning pants/underwear. Requiring Max A for socks and shoes  and rpeorts family can assist Toilet Transfer: Min guard;Ambulation;Rolling walker (2 wheels) (simulated in room)     Toileting - Clothing  Manipulation Details (indicate cue type and reason): Educating pt on compensatory techniques Tub/ Shower Transfer: Min guard;Walk-in shower;Ambulation;Rolling walker (2 wheels)   Functional mobility during ADLs: Min guard;Rolling walker (2 wheels) General ADL Comments: Limited by decrease hand function and sensation     Vision Baseline Vision/History: 1 Wears glasses       Perception     Praxis      Pertinent Vitals/Pain       Hand Dominance Right   Extremity/Trunk Assessment Upper Extremity Assessment Upper Extremity Assessment: RUE deficits/detail;LUE deficits/detail RUE Deficits / Details: Decreased sensation, strength, and FM skills. Difficulty performing opposition to 4th and 5th digits. Decreased grasp and pinch RUE Coordination: decreased fine motor;decreased gross motor LUE Deficits / Details: Decreased sensation, strength, and FM skills. Difficulty performing opposition to 4th and 5th digits. Decreased grasp and pinch. Also decreased ROM for digit extension LUE Coordination: decreased fine motor;decreased gross motor   Lower Extremity Assessment Lower Extremity Assessment: Defer to PT evaluation   Cervical / Trunk Assessment Cervical / Trunk Assessment: Neck Surgery   Communication Communication Communication: No difficulties   Cognition Arousal/Alertness: Awake/alert Behavior During Therapy: WFL for tasks assessed/performed Overall Cognitive Status: Within Functional Limits for tasks assessed                                       General Comments       Exercises     Shoulder Instructions      Home Living Family/patient expects to be discharged to:: Private residence Living Arrangements: Spouse/significant other;Children Available Help at Discharge: Family Type of Home: House Home Access: Stairs to enter Technical brewer of Steps: 3 Entrance Stairs-Rails: Right;Left Home Layout: Two level;Able to live on main level with  bedroom/bathroom;Full bath on main level     Bathroom Shower/Tub: Occupational psychologist: Handicapped height Bathroom Accessibility: Yes   Home Equipment: Shower seat;Grab bars - tub/shower;Toilet riser;Rolling Walker (2 wheels);Cane - single point;Rollator (4 wheels)          Prior Functioning/Environment Prior Level of Function : Independent/Modified Independent;Working/employed             Mobility Comments: Using rollator at home ADLs Comments: Performing ADLs and IADLs. limitations due to numbness in hands. Works in the office as OB        OT Problem List: Decreased strength;Decreased range of motion;Decreased activity tolerance;Impaired balance (sitting and/or standing);Decreased knowledge of use of DME or AE;Decreased knowledge of precautions      OT Treatment/Interventions: Self-care/ADL training;Therapeutic exercise;Energy conservation;DME and/or AE instruction;Therapeutic activities;Patient/family education    OT Goals(Current goals can be found in the care plan section) Acute Rehab OT Goals Patient Stated Goal: Go home OT Goal Formulation: With patient Time For Goal Achievement: 11/24/21 Potential to Achieve Goals: Good  OT Frequency: Min 2X/week    Co-evaluation              AM-PAC OT "6 Clicks" Daily Activity     Outcome Measure Help from another person eating meals?: A Little Help from another person taking care of personal grooming?: A Little Help from another person toileting, which includes using toliet, bedpan, or urinal?: A Little Help from another person bathing (including washing, rinsing, drying)?: A Lot Help from another person to put  on and taking off regular upper body clothing?: A Little Help from another person to put on and taking off regular lower body clothing?: A Lot 6 Click Score: 16   End of Session Equipment Utilized During Treatment: Rolling walker (2 wheels);Cervical collar Nurse Communication: Mobility  status  Activity Tolerance: Patient tolerated treatment well Patient left: in chair;with call bell/phone within reach  OT Visit Diagnosis: Unsteadiness on feet (R26.81);Other abnormalities of gait and mobility (R26.89);Muscle weakness (generalized) (M62.81)                Time: 9983-3825 OT Time Calculation (min): 24 min Charges:  OT General Charges $OT Visit: 1 Visit OT Evaluation $OT Eval Low Complexity: 1 Low  Markevious Ehmke MSOT, OTR/L Acute Rehab Office: [place] 11/10/2021, 9:32 AM

## 2021-11-10 NOTE — Evaluation (Signed)
Physical Therapy Evaluation Patient Details Name: Ryan GELLER, MD MRN: [id number] DOB: 07/27/1950 Today's Date: 11/10/2021  History of Present Illness  71 yo male s/p C3-6 ACDF on 6/19. PMH including CHF, hepatits, OA, DM type 2, HTN, MI, and obesity.  Clinical Impression  PTA pt living with wife in 2 story home with bed and bath on first level and 3 steps to enter. Pt reports ambulation with Rollator and independence in ADLs and iADLs. Pt is currently limited in safe mobility by cervical pain, and generalized weakness and mild instability. Pt min guard for bed mobility and transfers and contact guard assist for ambulation with Rollator. Pt will not have additional PT services at discharge, however acute care PT to return for stair training tomorrow before discharge.      Recommendations for follow up therapy are one component of a multi-disciplinary discharge planning process, led by the attending physician.  Recommendations may be updated based on patient status, additional functional criteria and insurance authorization.  Follow Up Recommendations Follow physician's recommendations for discharge plan and follow up therapies    Assistance Recommended at Discharge Intermittent Supervision/Assistance  Patient can return home with the following  A little help with bathing/dressing/bathroom;Assistance with cooking/housework;Assist for transportation;Help with stairs or ramp for entrance    Equipment Recommendations None recommended by PT     Functional Status Assessment Patient has had a recent decline in their functional status and demonstrates the ability to make significant improvements in function in a reasonable and predictable amount of time.     Precautions / Restrictions Precautions Precautions: Cervical Precaution Booklet Issued: Yes (comment) Required Braces or Orthoses: Cervical Brace Cervical Brace: Hard collar;At all times (Off for showers) Restrictions Weight Bearing  Restrictions: No      Mobility  Bed Mobility Overal bed mobility: Needs Assistance Bed Mobility: Rolling, Sidelying to Sit, Sit to Sidelying Rolling: Min guard Sidelying to sit: Min guard     Sit to sidelying: Min guard General bed mobility comments: Cues for log roll, cues to come to sidelying EoB before rolling onto his back    Transfers Overall transfer level: Needs assistance Equipment used: Rolling walker (2 wheels) Transfers: Sit to/from Stand Sit to Stand: Min guard           General transfer comment: MIn Guard A for safety    Ambulation/Gait Ambulation/Gait assistance: Herbalist (Feet): 120 Feet Assistive device: Rollator (4 wheels) Gait Pattern/deviations: Step-through pattern, Shuffle, Decreased step length - right, Decreased step length - left Gait velocity: slowed Gait velocity interpretation: <1.31 ft/sec, indicative of household ambulator   General Gait Details: light contact assistance for safety with slowed, waddling gait, mild instability, no overt LOB  Stairs Stairs:  (askes to defer until tomorrow)                Balance Overall balance assessment: Needs assistance Sitting-balance support: No upper extremity supported, Feet supported Sitting balance-Leahy Scale: Fair     Standing balance support: No upper extremity supported, During functional activity Standing balance-Leahy Scale: Fair                               Pertinent Vitals/Pain Pain Assessment Pain Assessment: Faces Faces Pain Scale: Hurts little more Pain Location: cervical spine Pain Descriptors / Indicators: Grimacing, Guarding Pain Intervention(s): Limited activity within patient's tolerance, Monitored during session, Repositioned    Home Living Family/patient expects to be discharged to:: Private residence  Living Arrangements: Spouse/significant other;Children Available Help at Discharge: Family Type of Home: House Home Access: Stairs  to enter Entrance Stairs-Rails: Psychiatric nurse of Steps: 3   Home Layout: Two level;Able to live on main level with bedroom/bathroom;Full bath on main level Home Equipment: Shower seat;Grab bars - tub/shower;Toilet riser;Rolling Walker (2 wheels);Cane - single point;Rollator (4 wheels)      Prior Function Prior Level of Function : Independent/Modified Independent;Working/employed             Mobility Comments: Using rollator at home ADLs Comments: Performing ADLs and IADLs. limitations due to numbness in hands. Works in the office as [employer]   Dominant Hand: Right    Extremity/Trunk Assessment   Upper Extremity Assessment Upper Extremity Assessment: Defer to OT evaluation RUE Deficits / Details: Decreased sensation, strength, and FM skills. Difficulty performing opposition to 4th and 5th digits. Decreased grasp and pinch RUE Coordination: decreased fine motor;decreased gross motor LUE Deficits / Details: Decreased sensation, strength, and FM skills. Difficulty performing opposition to 4th and 5th digits. Decreased grasp and pinch. Also decreased ROM for digit extension LUE Coordination: decreased fine motor;decreased gross motor    Lower Extremity Assessment Lower Extremity Assessment: Generalized weakness    Cervical / Trunk Assessment Cervical / Trunk Assessment: Neck Surgery  Communication   Communication: No difficulties  Cognition Arousal/Alertness: Awake/alert Behavior During Therapy: WFL for tasks assessed/performed Overall Cognitive Status: Within Functional Limits for tasks assessed                                          General Comments General comments (skin integrity, edema, etc.): VSS        Assessment/Plan    PT Assessment Patient needs continued PT services  PT Problem List Decreased activity tolerance;Pain;Decreased mobility       PT Treatment Interventions DME instruction;Gait  training;Stair training;Functional mobility training;Therapeutic activities;Therapeutic exercise;Balance training;Cognitive remediation;Patient/family education    PT Goals (Current goals can be found in the Care Plan section)  Acute Rehab PT Goals Patient Stated Goal: go home PT Goal Formulation: With patient Time For Goal Achievement: 11/24/21 Potential to Achieve Goals: Good    Frequency Min 3X/week        AM-PAC PT "6 Clicks" Mobility  Outcome Measure Help needed turning from your back to your side while in a flat bed without using bedrails?: None Help needed moving from lying on your back to sitting on the side of a flat bed without using bedrails?: None Help needed moving to and from a bed to a chair (including a wheelchair)?: None Help needed standing up from a chair using your arms (e.g., wheelchair or bedside chair)?: None Help needed to walk in hospital room?: A Little Help needed climbing 3-5 steps with a railing? : A Little 6 Click Score: 22    End of Session Equipment Utilized During Treatment: Gait belt;Cervical collar Activity Tolerance: Patient limited by pain;Patient limited by fatigue Patient left: in bed;with call bell/phone within reach Nurse Communication: Mobility status PT Visit Diagnosis: Other abnormalities of gait and mobility (R26.89);Muscle weakness (generalized) (M62.81);Pain Pain - part of body:  (neck)    Time: 6962-9528 PT Time Calculation (min) (ACUTE ONLY): 21 min   Charges:   PT Evaluation $PT Eval Low Complexity: 1 Low          Tallulah Hosman B. Migdalia Dk PT, DPT  Acute Rehabilitation Services Please use secure chat or  Call Office [phone]   [place] 11/10/2021, 1:07 PM

## 2021-11-11 ENCOUNTER — Other Ambulatory Visit: Payer: Self-pay

## 2021-11-11 ENCOUNTER — Encounter (HOSPITAL_COMMUNITY): Payer: Self-pay | Admitting: Orthopedic Surgery

## 2021-11-11 DIAGNOSIS — M4712 Other spondylosis with myelopathy, cervical region: Secondary | ICD-10-CM | POA: Diagnosis not present

## 2021-11-11 LAB — GLUCOSE, CAPILLARY: Glucose-Capillary: 105 mg/dL — ABNORMAL HIGH (ref 70–99)

## 2021-11-11 NOTE — Progress Notes (Signed)
Patient transported to his vehicle via wheelchair by volunteer for discharge home; in no acute distress nor complaints of pain nor discomfort; moves all extremities well; incision on his anterior neck with honeycomb dressing and is clean, dry and intact with Aspen collar on; room was checked for all his belongings; discharge instructions concerning his medications, incision care, follow up appointment and when to call the doctor as needed were all discussed with patient by RN and he expressed understanding on the instructions given.

## 2021-11-11 NOTE — Anesthesia Postprocedure Evaluation (Signed)
Anesthesia Post Note  Patient: Ryan Bombard, MD  Procedure(s) Performed: ANTERIOR CERVICAL DECOMPRESSION/DISCECTOMY FUSION 3 LEVELS C3-C6 (Left: Neck)     Patient location during evaluation: PACU Anesthesia Type: General Level of consciousness: awake and alert Pain management: pain level controlled Vital Signs Assessment: post-procedure vital signs reviewed and stable Respiratory status: spontaneous breathing, nonlabored ventilation, respiratory function stable and patient connected to nasal cannula oxygen Cardiovascular status: blood pressure returned to baseline and stable Postop Assessment: no apparent nausea or vomiting Anesthetic complications: no   No notable events documented.  Last Vitals:  Vitals:   11/11/21 0452 11/11/21 0748  BP: 130/78 116/82  Pulse: (!) 103 (!) 110  Resp: 18 16  Temp: 37.2 C 37.7 C  SpO2: 97% 95%    Last Pain:  Vitals:   11/11/21 0800  TempSrc:   PainSc: 3                  Massiah Longanecker

## 2021-11-11 NOTE — Progress Notes (Signed)
Subjective: 2 Days Post-Op Procedure(s) (LRB): ANTERIOR CERVICAL DECOMPRESSION/DISCECTOMY FUSION 3 LEVELS C3-C6 (Left) Patient reports pain as mild.  Reports incisional and muscle soreness. No N/V. No other c/o. Voiding without difficulty. Ready to go home.  Objective: Vital signs in last 24 hours: Temp:  [98.4 F (36.9 C)-100.4 F (38 C)] 99.8 F (37.7 C) (06/21 0748) Pulse Rate:  [100-110] 110 (06/21 0748) Resp:  [16-20] 16 (06/21 0748) BP: (116-144)/(78-93) 116/82 (06/21 0748) SpO2:  [94 %-100 %] 95 % (06/21 0748)  Intake/Output from previous day: 06/20 0701 - 06/21 0700 In: 300 [P.O.:300] Out: 800 [Urine:800] Intake/Output this shift: No intake/output data recorded.  No results for input(s): "HGB" in the last 72 hours. No results for input(s): "WBC", "RBC", "HCT", "PLT" in the last 72 hours. No results for input(s): "NA", "K", "CL", "CO2", "BUN", "CREATININE", "GLUCOSE", "CALCIUM" in the last 72 hours. No results for input(s): "LABPT", "INR" in the last 72 hours.  Neurologically intact ABD soft Neurovascular intact Sensation intact distally Intact pulses distally Dorsiflexion/Plantar flexion intact Incision: dressing C/D/I and no drainage No cellulitis present Compartment soft No sign of DVT   Assessment/Plan: 2 Days Post-Op Procedure(s) (LRB): ANTERIOR CERVICAL DECOMPRESSION/DISCECTOMY FUSION 3 LEVELS C3-C6 (Left) Advance diet Up with therapy D/C IV fluids D/C home today   Cecilie Kicks 11/11/2021, 7:54 AM

## 2021-11-11 NOTE — Consult Note (Signed)
   [facility] CM Inpatient Consult   11/11/2021  [name], MD 1951/05/13 [id number]   [place] Organization [ACO] Patient: [place] plan  Primary Care Provider:  Ginger Organ., MD is listed to provide the follow up call for transition of care   Briefly reviewed for post hospital needs, patient already transitioned home. PT/OT notes reviewed.   Plan: No needs assessed for  [facility] RN for post hospital care coordination.  For additional questions or referrals please contact:   Natividad Brood, RN BSN [facility] Liaison  [phone] business mobile phone Toll free office [phone]  Fax number: [phone] [email or web address]

## 2021-11-11 NOTE — Progress Notes (Signed)
Occupational Therapy Treatment Patient Details Name: Ryan REVOLORIO, MD MRN: [id number] DOB: [birth year] Today's Date: 11/11/2021   History of present illness 71 yo male s/p C3-6 ACDF on 6/19. PMH including CHF, hepatits, OA, DM type 2, HTN, MI, and obesity.   OT comments  Patient received seated on EOB. Patient states he believes he is doesn't need OT for self care but would like to address hand strengthening. Patient provided red therapy putty but was too difficulty to use. Patient provided beige therapy putty and was able to perform BUE hand strengthening exercises.  Patient demonstrated good understanding of hand exercises. Patient is expected to discharge home today.    Recommendations for follow up therapy are one component of a multi-disciplinary discharge planning process, led by the attending physician.  Recommendations may be updated based on patient status, additional functional criteria and insurance authorization.    Follow Up Recommendations  Follow physician's recommendations for discharge plan and follow up therapies    Assistance Recommended at Discharge Frequent or constant Supervision/Assistance  Patient can return home with the following  A little help with walking and/or transfers;A little help with bathing/dressing/bathroom;Assistance with cooking/housework   Equipment Recommendations  None recommended by OT    Recommendations for Other Services      Precautions / Restrictions Precautions Precautions: Cervical Precaution Booklet Issued: Yes (comment) Required Braces or Orthoses: Cervical Brace Cervical Brace: Hard collar;At all times (off for showers) Restrictions Weight Bearing Restrictions: No       Mobility Bed Mobility               General bed mobility comments: seated on EOB upon entry    Transfers                   General transfer comment: performed session seated on EOB     Balance Overall balance assessment: Needs  assistance Sitting-balance support: No upper extremity supported, Feet supported Sitting balance-Leahy Scale: Fair Sitting balance - Comments: able to maintain sitting balance on EOB                                   ADL either performed or assessed with clinical judgement   ADL                                              Extremity/Trunk Assessment Upper Extremity Assessment RUE Deficits / Details: Decreased sensation, strength, and FM skills. Difficulty performing opposition to 4th and 5th digits. Decreased grasp and pinch RUE Coordination: decreased fine motor;decreased gross motor LUE Deficits / Details: Decreased sensation, strength, and FM skills. Difficulty performing opposition to 4th and 5th digits. Decreased grasp and pinch. Also decreased ROM for digit extension LUE Coordination: decreased fine motor;decreased gross motor            Vision       Perception     Praxis      Cognition Arousal/Alertness: Awake/alert Behavior During Therapy: WFL for tasks assessed/performed Overall Cognitive Status: Within Functional Limits for tasks assessed                                          Exercises Exercises: Other exercises Other  Exercises Other Exercises: Provided patient with beige and red therapy putty and educated on BUE hand exercises to increase [facility] and strength    Shoulder Instructions       General Comments      Pertinent Vitals/ Pain       Pain Assessment Pain Assessment: Faces Faces Pain Scale: Hurts little more Pain Location: cervical spine Pain Descriptors / Indicators: Grimacing, Guarding Pain Intervention(s): Limited activity within patient's tolerance, Monitored during session  Home Living                                          Prior Functioning/Environment              Frequency  Min 2X/week        Progress Toward Goals  OT Goals(current goals can now be  found in the care plan section)  Progress towards OT goals: Progressing toward goals  Acute Rehab OT Goals Patient Stated Goal: go home OT Goal Formulation: With patient Time For Goal Achievement: 11/24/21 Potential to Achieve Goals: Good ADL Goals Pt Will Perform Upper Body Dressing: with modified independence;sitting Pt Will Perform Lower Body Dressing: with modified independence;sit to/from stand;with adaptive equipment Pt Will Transfer to Toilet: with modified independence;ambulating;bedside commode Pt Will Perform Toileting - Clothing Manipulation and hygiene: with modified independence;sitting/lateral leans;sit to/from stand Pt/caregiver will Perform Home Exercise Program: Increased ROM;Increased strength;Both right and left upper extremity;With written HEP provided;Independently  Plan Discharge plan remains appropriate    Co-evaluation                 AM-PAC OT "6 Clicks" Daily Activity     Outcome Measure   Help from another person eating meals?: A Little Help from another person taking care of personal grooming?: A Little Help from another person toileting, which includes using toliet, bedpan, or urinal?: A Little Help from another person bathing (including washing, rinsing, drying)?: A Lot Help from another person to put on and taking off regular upper body clothing?: A Little Help from another person to put on and taking off regular lower body clothing?: A Lot 6 Click Score: 16    End of Session Equipment Utilized During Treatment: Cervical collar  OT Visit Diagnosis: Unsteadiness on feet (R26.81);Other abnormalities of gait and mobility (R26.89);Muscle weakness (generalized) (M62.81)   Activity Tolerance Patient tolerated treatment well   Patient Left in bed;with call bell/phone within reach   Nurse Communication Mobility status        Time: 6767-2094 OT Time Calculation (min): 19 min  Charges: OT General Charges $OT Visit: 1 Visit OT  Treatments $Therapeutic Exercise: 8-22 mins  Lodema Hong, [place]  Office [place] 11/11/2021, 8:52 AM

## 2021-11-11 NOTE — Progress Notes (Signed)
Physical Therapy Treatment Patient Details Name: Ryan REIM, Ryan Walsh MRN: [id number] DOB: 29-Apr-1951 Today's Date: 11/11/2021   History of Present Illness 71 yo male s/p C3-6 ACDF on 6/19. PMH including CHF, hepatits, OA, DM type 2, HTN, MI, and obesity.    PT Comments    Pt admitted secondary to problem above with deficits below. Pt requiring min guard to min A for gait and stair navigation this session. No overt LOB noted. Educated about cervical precautions and walking program for home. Current recommendations appropriate. Will continue to follow acutely.        Recommendations for follow up therapy are one component of a multi-disciplinary discharge planning process, led by the attending physician.  Recommendations may be updated based on patient status, additional functional criteria and insurance authorization.  Follow Up Recommendations  Follow physician's recommendations for discharge plan and follow up therapies (would benefit from outpatient PT once cleared by Ryan Walsh.)     Assistance Recommended at Discharge Intermittent Supervision/Assistance  Patient can return home with the following A little help with bathing/dressing/bathroom;Assistance with cooking/housework;Assist for transportation;Help with stairs or ramp for entrance   Equipment Recommendations  None recommended by PT    Recommendations for Other Services       Precautions / Restrictions Precautions Precautions: Cervical Precaution Booklet Issued: Yes (comment) Required Braces or Orthoses: Cervical Brace Cervical Brace: Hard collar;At all times (Off for showers) Restrictions Weight Bearing Restrictions: No     Mobility  Bed Mobility Overal bed mobility: Needs Assistance Bed Mobility: Rolling, Sidelying to Sit Rolling: Supervision Sidelying to sit: Supervision       General bed mobility comments: Supervision for safety    Transfers Overall transfer level: Needs assistance Equipment used: Rolling  walker (2 wheels) Transfers: Sit to/from Stand Sit to Stand: Supervision           General transfer comment: Supervision for safety. Demonstrated safe hand placement.    Ambulation/Gait Ambulation/Gait assistance: Min guard Gait Distance (Feet): 120 Feet Assistive device: Rolling walker (2 wheels) Gait Pattern/deviations: Step-through pattern, Shuffle, Decreased step length - right, Decreased step length - left Gait velocity: slowed     General Gait Details: Waddle type gait. No overt LOB noted. Min guard for safety.   Stairs Stairs: Yes Stairs assistance: Min guard, Min assist Stair Management: One rail Right, Sideways, Step to pattern Number of Stairs: 2 General stair comments: Min guard to min A for steadying. Mild difficulty powering up secondary to LE weakness, but did not require lift assist to negotiate step.   Wheelchair Mobility    Modified Rankin (Stroke Patients Only)       Balance Overall balance assessment: Needs assistance Sitting-balance support: No upper extremity supported, Feet supported Sitting balance-Leahy Scale: Fair     Standing balance support: Bilateral upper extremity supported Standing balance-Leahy Scale: Fair                              Cognition Arousal/Alertness: Awake/alert Behavior During Therapy: WFL for tasks assessed/performed Overall Cognitive Status: Within Functional Limits for tasks assessed                                          Exercises      General Comments General comments (skin integrity, edema, etc.): educated about generalized walking program to perform at home  Pertinent Vitals/Pain Pain Assessment Pain Assessment: Faces Faces Pain Scale: Hurts little more Pain Location: cervical spine Pain Descriptors / Indicators: Grimacing, Guarding Pain Intervention(s): Limited activity within patient's tolerance, Monitored during session    Home Living                           Prior Function            PT Goals (current goals can now be found in the care plan section) Acute Rehab PT Goals Patient Stated Goal: go home PT Goal Formulation: With patient Time For Goal Achievement: 11/24/21 Potential to Achieve Goals: Good Progress towards PT goals: Progressing toward goals    Frequency    Min 5X/week      PT Plan Current plan remains appropriate    Co-evaluation              AM-PAC PT "6 Clicks" Mobility   Outcome Measure  Help needed turning from your back to your side while in a flat bed without using bedrails?: None Help needed moving from lying on your back to sitting on the side of a flat bed without using bedrails?: None Help needed moving to and from a bed to a chair (including a wheelchair)?: A Little Help needed standing up from a chair using your arms (e.g., wheelchair or bedside chair)?: A Little Help needed to walk in hospital room?: A Little Help needed climbing 3-5 steps with a railing? : A Little 6 Click Score: 20    End of Session Equipment Utilized During Treatment: Cervical collar Activity Tolerance: Patient tolerated treatment well Patient left: with call bell/phone within reach;in chair Nurse Communication: Mobility status PT Visit Diagnosis: Other abnormalities of gait and mobility (R26.89);Muscle weakness (generalized) (M62.81);Pain Pain - part of body:  (neck)     Time: 2482-5003 PT Time Calculation (min) (ACUTE ONLY): 12 min  Charges:  $Gait Training: 8-22 mins                     Ryan Walsh, PT, DPT  Acute Rehabilitation Services  Office: [phone]    Rudean Hitt 11/11/2021, 9:18 AM

## 2021-11-11 NOTE — Plan of Care (Signed)

## 2021-11-19 NOTE — Discharge Summary (Signed)
Patient ID: Ryan Bombard, MD MRN: [id number] DOB/AGE: 73/11/1950 73 y.o.  Admit date: 11/09/2021 Discharge date: 11/19/2021  Admission Diagnoses:  Principal Problem:   Cervical myelopathy [facility])   Discharge Diagnoses:  Same  Past Medical History:  Diagnosis Date   Chronic combined systolic and diastolic CHF (congestive heart failure) (HCC)    Coronary atherosclerosis of native coronary artery    Dental crowns present    Essential hypertension, benign    states under control with meds., has been on med. x 15 yr.   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 Patient has severe unremitting pain that affects sleep, daily activities, and work/hobbies. After pre-op clearance the patient was taken to the operating room on 11/09/2021 and underwent  Procedure(s): ANTERIOR CERVICAL DECOMPRESSION/DISCECTOMY FUSION 3 LEVELS C3-C6.    Patient was given perioperative antibiotics:  Anti-infectives (From admission, onward)    Start     Dose/Rate Route Frequency Ordered Stop   11/09/21 1815  ceFAZolin (ANCEF) IVPB 1 g/50 mL premix        1 g 100 mL/hr over 30 Minutes Intravenous Every 8 hours 11/09/21 1805 11/10/21 0248   11/09/21 1045  ceFAZolin (ANCEF) 2-4 GM/100ML-% IVPB       Note to Pharmacy: Alba Cory B: cabinet override      11/09/21 1045 11/09/21 1210   11/09/21 1039  ceFAZolin (ANCEF) IVPB 2g/100 mL premix        2 g 200 mL/hr over 30 Minutes Intravenous 30  min pre-op 11/09/21 1039 11/09/21 1604        Patient was given sequential compression devices, early ambulation, and chemoprophylaxis to prevent DVT.  Patient benefited maximally from hospital stay and there were no complications.    Recent vital signs: No data found.   Recent laboratory studies: No results for input(s): "WBC", "HGB", "HCT", "PLT", "NA", "K", "CL", "CO2", "BUN", "CREATININE", "GLUCOSE", "INR", "CALCIUM" in the last 72 hours.  Invalid input(s): "PT", "2"   Discharge Medications:   Allergies as of 11/11/2021   No Known Allergies      Medication List     STOP taking these medications    naproxen sodium 220 MG tablet Commonly known as: ALEVE       TAKE these medications    amLODipine 5 MG tablet Commonly known as: NORVASC TAKE 1 TABLET BY MOUTH ONCE DAILY   atorvastatin 40 MG tablet Commonly known as: LIPITOR TAKE 1 TABLET BY MOUTH AT BEDTIME   calcium carbonate 500 MG chewable tablet Commonly known as: TUMS - dosed in mg elemental calcium Chew 1,000 mg by mouth daily as needed for indigestion or heartburn.   carvedilol 25 MG tablet Commonly known as: COREG TAKE 1 TABLET BY MOUTH TWO TIMES DAILY WITH A MEAL   clopidogrel 75 MG tablet Commonly known as: PLAVIX Take 1 tablet by mouth once daily   diphenhydrAMINE 25 MG tablet Commonly known as: SOMINEX Take 50 mg by mouth at bedtime as needed  for sleep.   Farxiga 10 MG Tabs tablet Generic drug: dapagliflozin propanediol Take 1 tablet by mouth once daily   gabapentin 300 MG capsule Commonly known as: NEURONTIN Take 1 capsule by mouth 3 times a day for 2 weeks following surgery.Then take 1 capsule twice daily for 2 weeks. Then take 1 capsule once daily for 2 weeks. Then discontinue. What changed:  how much to take how to take this when to take this reasons to take this additional instructions   MELATONIN PO Take 15 mg by mouth at bedtime as needed (sleep).   metFORMIN 500 MG 24 hr  tablet Commonly known as: GLUCOPHAGE-XR Take 2 tablets by mouth twice daily What changed:  how much to take when to take this   ondansetron 4 MG tablet Commonly known as: Zofran Take 1 tablet (4 mg total) by mouth every 8 (eight) hours as needed for nausea or vomiting.   ramipril 10 MG capsule Commonly known as: ALTACE Take 1 capsule (10 mg total) by mouth 2 (two) times daily.   triamterene-hydrochlorothiazide 37.5-25 MG capsule Commonly known as: DYAZIDE TAKE 1 CAPSULE BY MOUTH ONCE A DAY IN THE MORNING   Trulicity 3 QP/6.1PJ Sopn Generic drug: Dulaglutide Inject 3 mg Subcutaneously once a week       ASK your doctor about these medications    methocarbamol 500 MG tablet Commonly known as: ROBAXIN Take 1 tablet (500 mg total) by mouth every 8 (eight) hours as needed for up to 5 days for muscle spasms. Ask about: Should I take this medication?   oxyCODONE-acetaminophen 10-325 MG tablet Commonly known as: Percocet Take 1 tablet by mouth every 6 (six) hours as needed for up to 5 days for pain. Ask about: Should I take this medication?        Diagnostic Studies: DG Cervical Spine 2 or 3 views  Result Date: 11/09/2021 CLINICAL DATA:  ACDF C3-C6 EXAM: CERVICAL SPINE - 2-3 VIEW COMPARISON:  MRI cervical spine 09/18/2021 FINDINGS: Intraoperative images during cervical spine ACDF from C3-C6. IMPRESSION: Intraoperative images during ACDF from C3-C6. Electronically Signed   By: Maurine Simmering M.D.   On: 11/09/2021 16:43   DG C-Arm 1-60 Min-No Report  Result Date: 11/09/2021 Fluoroscopy was utilized by the requesting physician.  No radiographic interpretation.   DG C-Arm 1-60 Min-No Report  Result Date: 11/09/2021 Fluoroscopy was utilized by the requesting physician.  No radiographic interpretation.   DG C-Arm 1-60 Min-No Report  Result Date: 11/09/2021 Fluoroscopy was utilized by the requesting physician.  No radiographic interpretation.   DG C-Arm 1-60 Min-No  Report  Result Date: 11/09/2021 Fluoroscopy was utilized by the requesting physician.  No radiographic interpretation.    Disposition: Discharge disposition: 01-Home or Self Care       Discharge Instructions     Call MD / Call 911   Complete by: As directed    If you experience chest pain or shortness of breath, CALL 911 and be transported to the hospital emergency room.  If you develope a fever above 101 F, pus (white drainage) or increased drainage or redness at the wound, or calf pain, call your surgeon's office.   Constipation Prevention   Complete by: As directed    Drink plenty of fluids.  Prune juice may be helpful.  You may use a stool softener, such as Colace (over the counter) 100 mg twice a day.  Use MiraLax (over the counter) for constipation as needed.   Diet - low sodium heart healthy  Complete by: As directed    Incentive spirometry RT   Complete by: As directed    Increase activity slowly as tolerated   Complete by: As directed    Post-operative opioid taper instructions:   Complete by: As directed    POST-OPERATIVE OPIOID TAPER INSTRUCTIONS: It is important to wean off of your opioid medication as soon as possible. If you do not need pain medication after your surgery it is ok to stop day one. Opioids include: Codeine, Hydrocodone(Norco, Vicodin), Oxycodone(Percocet, oxycontin) and hydromorphone amongst others.  Long term and even short term use of opiods can cause: Increased pain response Dependence Constipation Depression Respiratory depression And more.  Withdrawal symptoms can include Flu like symptoms Nausea, vomiting And more Techniques to manage these symptoms Hydrate well Eat regular healthy meals Stay active Use relaxation techniques(deep breathing, meditating, yoga) Do Not substitute Alcohol to help with tapering If you have been on opioids for less than two weeks and do not have pain than it is ok to stop all together.  Plan to wean off of  opioids This plan should start within one week post op of your joint replacement. Maintain the same interval or time between taking each dose and first decrease the dose.  Cut the total daily intake of opioids by one tablet each day Next start to increase the time between doses. The last dose that should be eliminated is the evening dose.           Follow-up Information     Melina Schools, MD Follow up in 2 week(s).   Specialty: Orthopedic Surgery Why: If symptoms worsen, For suture removal, For wound re-check Contact information: [address]  [place] [address] [phone]                  Signed: Cecilie Kicks 11/19/2021, 10:37 AM

## 2021-11-25 ENCOUNTER — Other Ambulatory Visit (HOSPITAL_COMMUNITY): Payer: Self-pay

## 2021-11-25 MED ORDER — TIZANIDINE HCL 4 MG PO TABS
ORAL_TABLET | ORAL | 0 refills | Status: DC
  Filled 2021-11-25: qty 30, 15d supply, fill #0

## 2021-11-25 MED ORDER — CELECOXIB 200 MG PO CAPS
ORAL_CAPSULE | ORAL | 0 refills | Status: DC
  Filled 2021-11-25: qty 30, 30d supply, fill #0

## 2021-11-30 ENCOUNTER — Other Ambulatory Visit (HOSPITAL_COMMUNITY): Payer: Self-pay

## 2021-11-30 MED ORDER — TRULICITY 3 MG/0.5ML ~~LOC~~ SOAJ
SUBCUTANEOUS | 2 refills | Status: DC
  Filled 2021-11-30: qty 6, 84d supply, fill #0
  Filled 2022-02-18: qty 6, 84d supply, fill #1
  Filled 2022-07-24 – 2022-07-28 (×5): qty 2, 28d supply, fill #2
  Filled 2022-08-16 – 2022-09-28 (×7): qty 2, 28d supply, fill #3

## 2021-11-30 MED ORDER — FARXIGA 10 MG PO TABS
10.0000 mg | ORAL_TABLET | Freq: Every day | ORAL | 0 refills | Status: DC
  Filled 2021-11-30: qty 90, 90d supply, fill #0

## 2021-12-01 ENCOUNTER — Other Ambulatory Visit (HOSPITAL_COMMUNITY): Payer: Self-pay

## 2021-12-04 DIAGNOSIS — H25813 Combined forms of age-related cataract, bilateral: Secondary | ICD-10-CM | POA: Diagnosis not present

## 2021-12-04 DIAGNOSIS — H53021 Refractive amblyopia, right eye: Secondary | ICD-10-CM | POA: Diagnosis not present

## 2021-12-04 DIAGNOSIS — H40013 Open angle with borderline findings, low risk, bilateral: Secondary | ICD-10-CM | POA: Diagnosis not present

## 2021-12-04 DIAGNOSIS — E119 Type 2 diabetes mellitus without complications: Secondary | ICD-10-CM | POA: Diagnosis not present

## 2021-12-18 ENCOUNTER — Encounter: Payer: Self-pay | Admitting: Podiatry

## 2021-12-18 ENCOUNTER — Ambulatory Visit (INDEPENDENT_AMBULATORY_CARE_PROVIDER_SITE_OTHER): Payer: 59 | Admitting: Podiatry

## 2021-12-18 DIAGNOSIS — E1140 Type 2 diabetes mellitus with diabetic neuropathy, unspecified: Secondary | ICD-10-CM

## 2021-12-18 DIAGNOSIS — E1149 Type 2 diabetes mellitus with other diabetic neurological complication: Secondary | ICD-10-CM

## 2021-12-18 DIAGNOSIS — B351 Tinea unguium: Secondary | ICD-10-CM

## 2021-12-18 DIAGNOSIS — M79675 Pain in left toe(s): Secondary | ICD-10-CM

## 2021-12-18 DIAGNOSIS — M79674 Pain in right toe(s): Secondary | ICD-10-CM

## 2021-12-21 DIAGNOSIS — M542 Cervicalgia: Secondary | ICD-10-CM | POA: Diagnosis not present

## 2021-12-21 NOTE — Progress Notes (Signed)
Subjective:   Patient ID: Ryan Bombard, MD, male   DOB: 71 y.o.   MRN: [id number]   HPI Long-term diabetic with elongated nailbeds 1-5 both feet that he cannot take care of they get thick and painful   ROS      Objective:  Physical Exam  Neurovascular status intact with thick yellow brittle nailbeds 1-5 both feet with pain     Assessment:  Chronic mycotic nail infection with pain 1-5 both feet     Plan:  Debridement of nailbeds 1-5 both feet no angiogenic bleeding reappoint routine care

## 2021-12-23 DIAGNOSIS — Z4789 Encounter for other orthopedic aftercare: Secondary | ICD-10-CM | POA: Diagnosis not present

## 2021-12-24 DIAGNOSIS — M542 Cervicalgia: Secondary | ICD-10-CM | POA: Diagnosis not present

## 2021-12-31 DIAGNOSIS — M542 Cervicalgia: Secondary | ICD-10-CM | POA: Diagnosis not present

## 2022-01-04 DIAGNOSIS — R29898 Other symptoms and signs involving the musculoskeletal system: Secondary | ICD-10-CM | POA: Diagnosis not present

## 2022-01-05 DIAGNOSIS — M542 Cervicalgia: Secondary | ICD-10-CM | POA: Diagnosis not present

## 2022-01-05 DIAGNOSIS — R2689 Other abnormalities of gait and mobility: Secondary | ICD-10-CM | POA: Diagnosis not present

## 2022-01-07 DIAGNOSIS — M542 Cervicalgia: Secondary | ICD-10-CM | POA: Diagnosis not present

## 2022-01-11 ENCOUNTER — Other Ambulatory Visit (HOSPITAL_COMMUNITY): Payer: Self-pay

## 2022-01-11 DIAGNOSIS — M25642 Stiffness of left hand, not elsewhere classified: Secondary | ICD-10-CM | POA: Diagnosis not present

## 2022-01-11 DIAGNOSIS — R29898 Other symptoms and signs involving the musculoskeletal system: Secondary | ICD-10-CM | POA: Diagnosis not present

## 2022-01-11 MED ORDER — RAMIPRIL 10 MG PO CAPS
ORAL_CAPSULE | ORAL | 2 refills | Status: DC
  Filled 2022-01-11: qty 180, 90d supply, fill #0
  Filled 2022-05-03: qty 180, 90d supply, fill #1
  Filled 2022-07-24: qty 180, 90d supply, fill #2

## 2022-01-11 MED ORDER — METFORMIN HCL ER 500 MG PO TB24
1000.0000 mg | ORAL_TABLET | Freq: Two times a day (BID) | ORAL | 2 refills | Status: DC
  Filled 2022-01-11: qty 360, 90d supply, fill #0
  Filled 2022-05-03: qty 360, 90d supply, fill #1
  Filled 2022-07-24: qty 360, 90d supply, fill #2

## 2022-01-12 DIAGNOSIS — M542 Cervicalgia: Secondary | ICD-10-CM | POA: Diagnosis not present

## 2022-01-15 DIAGNOSIS — R2689 Other abnormalities of gait and mobility: Secondary | ICD-10-CM | POA: Diagnosis not present

## 2022-01-15 DIAGNOSIS — M542 Cervicalgia: Secondary | ICD-10-CM | POA: Diagnosis not present

## 2022-01-18 DIAGNOSIS — R29898 Other symptoms and signs involving the musculoskeletal system: Secondary | ICD-10-CM | POA: Diagnosis not present

## 2022-01-18 DIAGNOSIS — M25642 Stiffness of left hand, not elsewhere classified: Secondary | ICD-10-CM | POA: Diagnosis not present

## 2022-01-19 DIAGNOSIS — M542 Cervicalgia: Secondary | ICD-10-CM | POA: Diagnosis not present

## 2022-01-20 DIAGNOSIS — R29898 Other symptoms and signs involving the musculoskeletal system: Secondary | ICD-10-CM | POA: Diagnosis not present

## 2022-01-20 DIAGNOSIS — M25642 Stiffness of left hand, not elsewhere classified: Secondary | ICD-10-CM | POA: Diagnosis not present

## 2022-01-21 DIAGNOSIS — M542 Cervicalgia: Secondary | ICD-10-CM | POA: Diagnosis not present

## 2022-01-26 DIAGNOSIS — M25642 Stiffness of left hand, not elsewhere classified: Secondary | ICD-10-CM | POA: Diagnosis not present

## 2022-01-26 DIAGNOSIS — R29898 Other symptoms and signs involving the musculoskeletal system: Secondary | ICD-10-CM | POA: Diagnosis not present

## 2022-01-27 DIAGNOSIS — M542 Cervicalgia: Secondary | ICD-10-CM | POA: Diagnosis not present

## 2022-01-28 ENCOUNTER — Other Ambulatory Visit (HOSPITAL_COMMUNITY): Payer: Self-pay

## 2022-01-28 DIAGNOSIS — R29898 Other symptoms and signs involving the musculoskeletal system: Secondary | ICD-10-CM | POA: Diagnosis not present

## 2022-01-28 DIAGNOSIS — M25642 Stiffness of left hand, not elsewhere classified: Secondary | ICD-10-CM | POA: Diagnosis not present

## 2022-01-28 MED ORDER — CLOPIDOGREL BISULFATE 75 MG PO TABS
75.0000 mg | ORAL_TABLET | Freq: Every day | ORAL | 2 refills | Status: DC
Start: 2022-01-28 — End: 2022-09-24
  Filled 2022-01-28: qty 90, 90d supply, fill #0
  Filled 2022-05-03: qty 90, 90d supply, fill #1
  Filled 2022-07-24: qty 90, 90d supply, fill #2

## 2022-01-29 DIAGNOSIS — E785 Hyperlipidemia, unspecified: Secondary | ICD-10-CM | POA: Diagnosis not present

## 2022-01-29 DIAGNOSIS — Z125 Encounter for screening for malignant neoplasm of prostate: Secondary | ICD-10-CM | POA: Diagnosis not present

## 2022-01-29 DIAGNOSIS — M542 Cervicalgia: Secondary | ICD-10-CM | POA: Diagnosis not present

## 2022-01-29 DIAGNOSIS — R7989 Other specified abnormal findings of blood chemistry: Secondary | ICD-10-CM | POA: Diagnosis not present

## 2022-01-29 DIAGNOSIS — I10 Essential (primary) hypertension: Secondary | ICD-10-CM | POA: Diagnosis not present

## 2022-02-01 DIAGNOSIS — M25642 Stiffness of left hand, not elsewhere classified: Secondary | ICD-10-CM | POA: Diagnosis not present

## 2022-02-02 DIAGNOSIS — M542 Cervicalgia: Secondary | ICD-10-CM | POA: Diagnosis not present

## 2022-02-03 DIAGNOSIS — Z4889 Encounter for other specified surgical aftercare: Secondary | ICD-10-CM | POA: Diagnosis not present

## 2022-02-04 ENCOUNTER — Other Ambulatory Visit (HOSPITAL_COMMUNITY): Payer: Self-pay

## 2022-02-04 DIAGNOSIS — M25641 Stiffness of right hand, not elsewhere classified: Secondary | ICD-10-CM | POA: Diagnosis not present

## 2022-02-04 DIAGNOSIS — R29898 Other symptoms and signs involving the musculoskeletal system: Secondary | ICD-10-CM | POA: Diagnosis not present

## 2022-02-05 ENCOUNTER — Other Ambulatory Visit (HOSPITAL_COMMUNITY): Payer: Self-pay

## 2022-02-05 DIAGNOSIS — R82998 Other abnormal findings in urine: Secondary | ICD-10-CM | POA: Diagnosis not present

## 2022-02-05 DIAGNOSIS — M159 Polyosteoarthritis, unspecified: Secondary | ICD-10-CM | POA: Diagnosis not present

## 2022-02-05 DIAGNOSIS — E785 Hyperlipidemia, unspecified: Secondary | ICD-10-CM | POA: Diagnosis not present

## 2022-02-05 DIAGNOSIS — Z1331 Encounter for screening for depression: Secondary | ICD-10-CM | POA: Diagnosis not present

## 2022-02-05 DIAGNOSIS — Z0000 Encounter for general adult medical examination without abnormal findings: Secondary | ICD-10-CM | POA: Diagnosis not present

## 2022-02-05 DIAGNOSIS — Z9861 Coronary angioplasty status: Secondary | ICD-10-CM | POA: Diagnosis not present

## 2022-02-05 DIAGNOSIS — Z1339 Encounter for screening examination for other mental health and behavioral disorders: Secondary | ICD-10-CM | POA: Diagnosis not present

## 2022-02-05 DIAGNOSIS — I110 Hypertensive heart disease with heart failure: Secondary | ICD-10-CM | POA: Diagnosis not present

## 2022-02-05 DIAGNOSIS — E1165 Type 2 diabetes mellitus with hyperglycemia: Secondary | ICD-10-CM | POA: Diagnosis not present

## 2022-02-05 DIAGNOSIS — I2510 Atherosclerotic heart disease of native coronary artery without angina pectoris: Secondary | ICD-10-CM | POA: Diagnosis not present

## 2022-02-05 DIAGNOSIS — I5022 Chronic systolic (congestive) heart failure: Secondary | ICD-10-CM | POA: Diagnosis not present

## 2022-02-05 DIAGNOSIS — M542 Cervicalgia: Secondary | ICD-10-CM | POA: Diagnosis not present

## 2022-02-05 MED ORDER — TIZANIDINE HCL 4 MG PO TABS
4.0000 mg | ORAL_TABLET | Freq: Two times a day (BID) | ORAL | 0 refills | Status: DC
Start: 2022-02-04 — End: 2022-07-01
  Filled 2022-02-05: qty 30, 15d supply, fill #0

## 2022-02-05 MED ORDER — CELECOXIB 200 MG PO CAPS
200.0000 mg | ORAL_CAPSULE | Freq: Every day | ORAL | 0 refills | Status: DC
  Filled 2022-02-05: qty 30, 30d supply, fill #0

## 2022-02-08 DIAGNOSIS — M542 Cervicalgia: Secondary | ICD-10-CM | POA: Diagnosis not present

## 2022-02-09 DIAGNOSIS — H6120 Impacted cerumen, unspecified ear: Secondary | ICD-10-CM | POA: Diagnosis not present

## 2022-02-09 DIAGNOSIS — M25642 Stiffness of left hand, not elsewhere classified: Secondary | ICD-10-CM | POA: Diagnosis not present

## 2022-02-09 DIAGNOSIS — R29898 Other symptoms and signs involving the musculoskeletal system: Secondary | ICD-10-CM | POA: Diagnosis not present

## 2022-02-11 DIAGNOSIS — R29898 Other symptoms and signs involving the musculoskeletal system: Secondary | ICD-10-CM | POA: Diagnosis not present

## 2022-02-11 DIAGNOSIS — M25632 Stiffness of left wrist, not elsewhere classified: Secondary | ICD-10-CM | POA: Diagnosis not present

## 2022-02-15 DIAGNOSIS — M542 Cervicalgia: Secondary | ICD-10-CM | POA: Diagnosis not present

## 2022-02-16 ENCOUNTER — Encounter: Payer: Self-pay | Admitting: Interventional Cardiology

## 2022-02-16 ENCOUNTER — Ambulatory Visit: Payer: 59 | Attending: Interventional Cardiology | Admitting: Interventional Cardiology

## 2022-02-16 ENCOUNTER — Other Ambulatory Visit (HOSPITAL_COMMUNITY): Payer: Self-pay

## 2022-02-16 VITALS — BP 120/80 | HR 94 | Ht 65.0 in | Wt 208.2 lb

## 2022-02-16 DIAGNOSIS — I25118 Atherosclerotic heart disease of native coronary artery with other forms of angina pectoris: Secondary | ICD-10-CM

## 2022-02-16 DIAGNOSIS — I10 Essential (primary) hypertension: Secondary | ICD-10-CM

## 2022-02-16 DIAGNOSIS — E1165 Type 2 diabetes mellitus with hyperglycemia: Secondary | ICD-10-CM

## 2022-02-16 DIAGNOSIS — I5042 Chronic combined systolic (congestive) and diastolic (congestive) heart failure: Secondary | ICD-10-CM

## 2022-02-16 DIAGNOSIS — M25642 Stiffness of left hand, not elsewhere classified: Secondary | ICD-10-CM | POA: Diagnosis not present

## 2022-02-16 DIAGNOSIS — E7849 Other hyperlipidemia: Secondary | ICD-10-CM | POA: Diagnosis not present

## 2022-02-16 DIAGNOSIS — R29898 Other symptoms and signs involving the musculoskeletal system: Secondary | ICD-10-CM | POA: Diagnosis not present

## 2022-02-16 MED ORDER — EZETIMIBE 10 MG PO TABS
10.0000 mg | ORAL_TABLET | Freq: Every day | ORAL | 3 refills | Status: DC
  Filled 2022-02-16: qty 90, 90d supply, fill #0
  Filled 2022-05-03: qty 90, 90d supply, fill #1
  Filled 2022-12-02: qty 90, 90d supply, fill #2

## 2022-02-16 NOTE — Progress Notes (Signed)
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  ANTERIOR CERVICAL DECOMPRESSION/DISCECTOMY FUSION 3 LEVELS C3-C6 on 11/09/2021 and discharged 11/11/2021.   Zayde is doing relatively well.  He thought he had carpal tunnel, had surgery, but still had numbness tingling and loss of dexterity in his hands.  Further evaluation identified severe cervical degenerative disc disease C3-C6.  He underwent the above stated surgery and is in recuperation now.  He is not working since June.  He feels he is making progress in rehab.  There were no cardiopulmonary complications that occurred during the surgery or the postoperative period.  He denies angina, orthopnea, PND.  No claudication or neurological symptoms.  Not using CPAP reliably.  He is high risk for CV events with prior anterior MI and reduced LV function.  New target LDL for him is 55.  We discussed this and have decided to start Zetia 10 mg/day.  Past Medical History:  Diagnosis Date   Chronic combined systolic and diastolic CHF (congestive heart failure) (HCC)    Coronary atherosclerosis of native coronary artery    Dental crowns present    Essential hypertension, benign    states under control with meds., has been on med. x 15 yr.   GERD (gastroesophageal reflux disease)    Hepatitis    Drug induced hepatitis   MI (myocardial infarction) ([place]) 10/27/2002   Non-insulin  dependent type 2 diabetes mellitus ([place])    Obesity    Osteoarthritis    hips and knees   Pure hypercholesterolemia    Sleep apnea     Past Surgical History:  Procedure Laterality Date   ANKLE SURGERY Left    as a child   ANTERIOR CERVICAL DECOMP/DISCECTOMY FUSION Left 11/09/2021   Procedure: ANTERIOR CERVICAL DECOMPRESSION/DISCECTOMY FUSION 3 LEVELS C3-C6;  Surgeon: Melina Schools, MD;  Location: [place];  Service: Orthopedics;  Laterality: Left;  4 hrs 3 C-Bed   ANTERIOR INTEROSSEOUS NERVE DECOMPRESSION Right 04/15/2021   Procedure: ANTERIOR INTEROSSEOUS NERVE DECOMPRESSION;  Surgeon: [name], MD;  Location: [place];  Service: Orthopedics;  Laterality: Right;  Only need 1 hour for case,  Axillary block/Mac   CARDIAC CATHETERIZATION  10/27/2002   CARPAL TUNNEL RELEASE Right 11/21/2013   CARPAL TUNNEL RELEASE Right 04/15/2021   Procedure: CARPAL TUNNEL RELEASE;  Surgeon: [name], MD;  Location: [place];  Service: Orthopedics;  Laterality: Right;   CORONARY STENT PLACEMENT  10/27/2002   mid LAD   TOTAL HIP ARTHROPLASTY Left 04/09/1999   TOTAL HIP ARTHROPLASTY Right    TOTAL KNEE ARTHROPLASTY Left 02/03/2011   TOTAL KNEE ARTHROPLASTY Right 05/11/2021   Procedure: TOTAL KNEE ARTHROPLASTY;  Surgeon: Gaynelle Arabian, MD;  Location: [facility];  Service: Orthopedics;  Laterality: Right;   ULNAR TUNNEL RELEASE Left 06/18/2016   Procedure: LEFT CUBITAL TUNNEL RELEASE;  Surgeon: [name], MD;  Location: [place];  Service: Orthopedics;  Laterality: Left;    Current Medications: Current Meds  Medication Sig   amLODipine (NORVASC) 5 MG tablet TAKE 1 TABLET BY MOUTH ONCE DAILY   atorvastatin (LIPITOR) 40 MG tablet TAKE 1 TABLET BY MOUTH AT BEDTIME   calcium carbonate (TUMS - DOSED IN MG ELEMENTAL CALCIUM) 500 MG chewable tablet Chew 1,000 mg by mouth daily as needed for indigestion or heartburn.   carvedilol (COREG) 25  MG tablet TAKE 1 TABLET BY MOUTH TWO TIMES DAILY WITH A MEAL   celecoxib (CELEBREX) 200 MG capsule Take 1 capsule (200 mg total) by mouth at bedtime.   clopidogrel (PLAVIX) 75 MG tablet Take 1 tablet by mouth once daily   dapagliflozin propanediol (FARXIGA) 10 MG TABS tablet Take 1 tablet by mouth once daily   diphenhydrAMINE (SOMINEX) 25 MG tablet Take 50 mg by mouth at bedtime as needed for sleep.   Dulaglutide (TRULICITY) 3 ZO/1.0RU SOPN Inject 3 mg into the skin once a week   ezetimibe (ZETIA) 10 MG tablet Take 1 tablet (10 mg total) by mouth daily.   gabapentin (NEURONTIN) 300 MG capsule Take 1 capsule by mouth 3 times a day for 2 weeks following surgery.Then take 1 capsule twice daily for 2 weeks. Then take 1 capsule once daily for 2 weeks. Then discontinue. (Patient taking differently: Take 300 mg by mouth daily as needed (pain).)   MELATONIN PO Take 15 mg by mouth at bedtime as needed (sleep).   metFORMIN (GLUCOPHAGE-XR) 500 MG 24 hr tablet Take 2 tablets by mouth twice daily   ondansetron (ZOFRAN) 4 MG tablet Take 1 tablet (4 mg total) by mouth every 8 (eight) hours as needed for nausea or vomiting.   ramipril (ALTACE) 10 MG capsule Take 1 capsule (10 mg total) by mouth 2 (two) times daily.   tiZANidine (ZANAFLEX) 4 MG tablet Take 1 tablet by mouth twice a day   triamterene-hydrochlorothiazide (DYAZIDE) 37.5-25 MG capsule TAKE 1 CAPSULE BY MOUTH ONCE A DAY IN THE MORNING     Allergies:   Patient has no known allergies.   Social History   Socioeconomic History   Marital status: Married    Spouse name: Not on file   Number of children: Not on file   Years of education: Not on file   Highest education level: Not on file  Occupational History   Not on file  Tobacco Use   Smoking status: Never   Smokeless tobacco: Never  Vaping Use   Vaping Use: Never used  Substance and Sexual Activity   Alcohol use: Yes    Alcohol/week: 0.0 standard drinks of alcohol    Comment: occasionally    Drug use: No   Sexual activity: Not on file  Other Topics Concern   Not on file  Social History Narrative   Not on file   Social Determinants of Health   Financial Resource Strain: Not on file  Food Insecurity: Not on file  Transportation Needs: Not on file  Physical Activity: Not on file  Stress: Not on file  Social Connections: Not on file     Family History: The patient's family history includes Healthy in his father and mother.  ROS:   Please see the history of present illness.    Stiffness in his neck but no pain.  Still having some numbness and tingling in his hands, right greater than left.  All other systems reviewed and are negative.  EKGs/Labs/Other Studies Reviewed:  The following studies were reviewed today:  2D Doppler echocardiogram 2021: IMPRESSIONS   1. Left ventricular ejection fraction, by estimation, is 45 to 50%. The  left ventricle has mildly reduced function. The left ventricle has no  regional wall motion abnormalities. There is mild concentric hypertrophy  with focal moderate basal-septal  hypertrophy.Left ventricular diastolic parameters are consistent with  Grade I diastolic dysfunction (impaired relaxation).   2. Right ventricular systolic function is normal. The right ventricular  size is normal.   3. The mitral valve is normal in structure. Trivial mitral valve  regurgitation.   4. The aortic valve is tricuspid. There is mild calcification of the  aortic valve. There is mild thickening of the aortic valve. Aortic valve  regurgitation is not visualized.   5. Aortic dilatation noted. There is mild dilatation of the aortic root,  measuring 41 mm. There is mild to moderate dilatation of the ascending  aorta, measuring 41 mm.   6. The inferior vena cava is normal in size with <50% respiratory  variability, suggesting right atrial pressure of 8 mmHg.   Comparison(s): Compared to prior TTE in 2017, the LVEF now appears to be  about 45-50%.     EKG:  EKG normal sinus rhythm, biatrial abnormality, QS pattern V1 through V4 with left axis deviation/left anterior hemiblock.  His EKG is unchanged when compared to prior.  Recent Labs: 11/02/2021: ALT 18; BUN 18; Creatinine, Ser 0.99; Hemoglobin 13.0; Platelets 175; Potassium 3.8; Sodium 137  Recent Lipid Panel    Component Value Date/Time   CHOL 190 07/29/2016 0000   TRIG 93 07/29/2016 0000   HDL 55 07/29/2016 0000   LDLCALC 116 (H) 07/29/2016 0000    Physical Exam:    VS:  BP 120/80   Pulse 94   Ht _0  (1.651 m)   Wt 208 lb 3.2 oz (94.4 kg)   SpO2 96%   BMI 34.65 kg/m     Wt Readings from Last 3 Encounters:  02/16/22 208 lb 3.2 oz (94.4 kg)  11/09/21 208 lb (94.3 kg)  11/02/21 208 lb 8 oz (94.6 kg)     GEN: Obese with BMI 35.. No acute distress HEENT: Normal NECK: No JVD. LYMPHATICS: No lymphadenopathy CARDIAC: No murmur. RRR S4 but no S3 gallop, or edema. VASCULAR:  Normal Pulses. No bruits. RESPIRATORY:  Clear to auscultation without rales, wheezing or rhonchi  ABDOMEN: Soft, non-tender, non-distended, No pulsatile mass, MUSCULOSKELETAL: No deformity  SKIN: Warm and dry NEUROLOGIC:  Alert and oriented x 3 PSYCHIATRIC:  Normal affect   ASSESSMENT:    1. Coronary artery disease involving native coronary artery of native heart with other form of angina pectoris ([place])   2. Chronic combined systolic and diastolic CHF (congestive heart failure) (HCC)   3. Other hyperlipidemia   4. Essential hypertension, benign   5. Uncontrolled type 2 diabetes mellitus with hyperglycemia (HCC)    PLAN:    In order of problems listed above:  Secondary prevention reviewed.  LDL target for Dr. Jodi Mourning is less than 70.  Add Zetia to high intensity atorvastatin.  Lipid panel will be done later this year by Dr. Brigitte Pulse.  We reviewed the benefits of moderate physical activity relative to cardiovascular prevention. Current heart failure regimen is not ideal but has been stable for  years and we have continued with: ACE inhibitor therapy, Farxiga, carvedilol, and have not tried an MRA or escalated renin-angiotensin blockade to [employer]. Add Zetia 10 mg/day Blood pressure control is excellent  A1c target less than 7.  Most recent A1c is from 2021.  This is being followed by Dr. Brigitte Pulse.  Overall education and awareness concerning primary/secondary risk prevention was discussed in detail: LDL less than 70, hemoglobin A1c less than 7, blood pressure target less than 130/80 mmHg, >150 minutes of moderate aerobic activity per week, avoidance of smoking, weight control (via diet and exercise), and continued surveillance/management of/for obstructive sleep apnea.    He will return in 9 months for cardiac follow-up. Dr. Irish Lack is requested at my recommendation.   Medication Adjustments/Labs and Tests Ordered: Current medicines are reviewed at length with the patient today.  Concerns regarding medicines are outlined above.  Orders Placed This Encounter  Procedures   EKG 12-Lead   Meds ordered this encounter  Medications   ezetimibe (ZETIA) 10 MG tablet    Sig: Take 1 tablet (10 mg total) by mouth daily.    Dispense:  90 tablet    Refill:  3    Patient Instructions  Medication Instructions:  Your physician has recommended you make the following change in your medication:   Start taking Zetia/ezetimibe 20m daily  *If you need a refill on your cardiac medications before your next appointment, please call your pharmacy*   Follow-Up: [facility] you and your health needs are our priority.  As part of our continuing mission to provide you with exceptional heart care, we have created designated Provider Care Teams.  These Care Teams include your primary Cardiologist (physician) and Advanced Practice Providers (APPs -  Physician Assistants and Nurse Practitioners) who all work together to provide you with the care you need, when you need it.  Your next  appointment:   9 month(s)  The format for your next appointment:   In Person  Provider:   Dr. VBeau Fanny  Important Information About Sugar         Signed, HSinclair Grooms MD  02/16/2022 5:14 PM    [place]

## 2022-02-16 NOTE — Patient Instructions (Signed)
Medication Instructions:  Your physician has recommended you make the following change in your medication:   Start taking Zetia/ezetimibe '10mg'$  daily  *If you need a refill on your cardiac medications before your next appointment, please call your pharmacy*   Follow-Up: At [facility], you and your health needs are our priority.  As part of our continuing mission to provide you with exceptional heart care, we have created designated Provider Care Teams.  These Care Teams include your primary Cardiologist (physician) and Advanced Practice Providers (APPs -  Physician Assistants and Nurse Practitioners) who all work together to provide you with the care you need, when you need it.  Your next appointment:   9 month(s)  The format for your next appointment:   In Person  Provider:   Dr. Beau Fanny   Important Information About Sugar

## 2022-02-17 ENCOUNTER — Other Ambulatory Visit (HOSPITAL_COMMUNITY): Payer: Self-pay

## 2022-02-18 ENCOUNTER — Other Ambulatory Visit (HOSPITAL_COMMUNITY): Payer: Self-pay

## 2022-02-18 DIAGNOSIS — M25642 Stiffness of left hand, not elsewhere classified: Secondary | ICD-10-CM | POA: Diagnosis not present

## 2022-02-18 DIAGNOSIS — R29898 Other symptoms and signs involving the musculoskeletal system: Secondary | ICD-10-CM | POA: Diagnosis not present

## 2022-02-19 ENCOUNTER — Other Ambulatory Visit (HOSPITAL_COMMUNITY): Payer: Self-pay

## 2022-02-22 DIAGNOSIS — M5412 Radiculopathy, cervical region: Secondary | ICD-10-CM | POA: Diagnosis not present

## 2022-02-23 DIAGNOSIS — R29898 Other symptoms and signs involving the musculoskeletal system: Secondary | ICD-10-CM | POA: Diagnosis not present

## 2022-02-23 DIAGNOSIS — M25642 Stiffness of left hand, not elsewhere classified: Secondary | ICD-10-CM | POA: Diagnosis not present

## 2022-02-25 DIAGNOSIS — R29898 Other symptoms and signs involving the musculoskeletal system: Secondary | ICD-10-CM | POA: Diagnosis not present

## 2022-02-25 DIAGNOSIS — M25632 Stiffness of left wrist, not elsewhere classified: Secondary | ICD-10-CM | POA: Diagnosis not present

## 2022-03-01 DIAGNOSIS — M5412 Radiculopathy, cervical region: Secondary | ICD-10-CM | POA: Diagnosis not present

## 2022-03-01 DIAGNOSIS — M542 Cervicalgia: Secondary | ICD-10-CM | POA: Diagnosis not present

## 2022-03-05 DIAGNOSIS — G4733 Obstructive sleep apnea (adult) (pediatric): Secondary | ICD-10-CM | POA: Diagnosis not present

## 2022-03-08 DIAGNOSIS — R29898 Other symptoms and signs involving the musculoskeletal system: Secondary | ICD-10-CM | POA: Diagnosis not present

## 2022-03-08 DIAGNOSIS — M25641 Stiffness of right hand, not elsewhere classified: Secondary | ICD-10-CM | POA: Diagnosis not present

## 2022-03-22 ENCOUNTER — Encounter: Payer: Self-pay | Admitting: Podiatry

## 2022-03-22 ENCOUNTER — Ambulatory Visit (INDEPENDENT_AMBULATORY_CARE_PROVIDER_SITE_OTHER): Payer: 59 | Admitting: Podiatry

## 2022-03-22 DIAGNOSIS — E1140 Type 2 diabetes mellitus with diabetic neuropathy, unspecified: Secondary | ICD-10-CM | POA: Diagnosis not present

## 2022-03-22 DIAGNOSIS — B351 Tinea unguium: Secondary | ICD-10-CM

## 2022-03-22 DIAGNOSIS — M79674 Pain in right toe(s): Secondary | ICD-10-CM

## 2022-03-22 DIAGNOSIS — R29898 Other symptoms and signs involving the musculoskeletal system: Secondary | ICD-10-CM | POA: Diagnosis not present

## 2022-03-22 DIAGNOSIS — M25632 Stiffness of left wrist, not elsewhere classified: Secondary | ICD-10-CM | POA: Diagnosis not present

## 2022-03-22 DIAGNOSIS — E1149 Type 2 diabetes mellitus with other diabetic neurological complication: Secondary | ICD-10-CM | POA: Diagnosis not present

## 2022-03-22 DIAGNOSIS — M79675 Pain in left toe(s): Secondary | ICD-10-CM

## 2022-03-23 NOTE — Progress Notes (Signed)
Subjective:   Patient ID: Ryan Bombard, MD, male   DOB: 71 y.o.   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

## 2022-03-31 DIAGNOSIS — R972 Elevated prostate specific antigen [PSA]: Secondary | ICD-10-CM | POA: Diagnosis not present

## 2022-04-02 DIAGNOSIS — N5201 Erectile dysfunction due to arterial insufficiency: Secondary | ICD-10-CM | POA: Diagnosis not present

## 2022-04-02 DIAGNOSIS — N401 Enlarged prostate with lower urinary tract symptoms: Secondary | ICD-10-CM | POA: Diagnosis not present

## 2022-04-02 DIAGNOSIS — N3941 Urge incontinence: Secondary | ICD-10-CM | POA: Diagnosis not present

## 2022-04-02 DIAGNOSIS — R972 Elevated prostate specific antigen [PSA]: Secondary | ICD-10-CM | POA: Diagnosis not present

## 2022-04-07 DIAGNOSIS — Z4889 Encounter for other specified surgical aftercare: Secondary | ICD-10-CM | POA: Diagnosis not present

## 2022-05-03 ENCOUNTER — Other Ambulatory Visit: Payer: Self-pay | Admitting: Interventional Cardiology

## 2022-05-03 ENCOUNTER — Other Ambulatory Visit (HOSPITAL_COMMUNITY): Payer: Self-pay

## 2022-05-04 ENCOUNTER — Other Ambulatory Visit (HOSPITAL_COMMUNITY): Payer: Self-pay

## 2022-05-04 MED ORDER — CARVEDILOL 25 MG PO TABS
25.0000 mg | ORAL_TABLET | Freq: Two times a day (BID) | ORAL | 2 refills | Status: DC
Start: 2022-05-04 — End: 2022-12-15
  Filled 2022-05-04 – 2022-05-05 (×2): qty 180, 90d supply, fill #0
  Filled 2022-07-24: qty 180, 90d supply, fill #1
  Filled 2022-09-23: qty 180, 90d supply, fill #2

## 2022-05-04 MED ORDER — TRIAMTERENE-HCTZ 37.5-25 MG PO CAPS
1.0000 | ORAL_CAPSULE | Freq: Every morning | ORAL | 2 refills | Status: DC
  Filled 2022-05-04 – 2022-05-05 (×2): qty 90, 90d supply, fill #0
  Filled 2022-07-24: qty 90, 90d supply, fill #1
  Filled 2022-09-23: qty 90, 90d supply, fill #2

## 2022-05-04 MED ORDER — AMLODIPINE BESYLATE 5 MG PO TABS
5.0000 mg | ORAL_TABLET | Freq: Every day | ORAL | 2 refills | Status: DC
  Filled 2022-05-04 – 2022-05-05 (×2): qty 90, 90d supply, fill #0
  Filled 2022-07-24: qty 90, 90d supply, fill #1
  Filled 2022-09-23: qty 90, 90d supply, fill #2

## 2022-05-05 ENCOUNTER — Other Ambulatory Visit: Payer: Self-pay

## 2022-05-05 ENCOUNTER — Other Ambulatory Visit (HOSPITAL_COMMUNITY): Payer: Self-pay

## 2022-05-27 DIAGNOSIS — K219 Gastro-esophageal reflux disease without esophagitis: Secondary | ICD-10-CM | POA: Diagnosis not present

## 2022-05-27 DIAGNOSIS — Z1211 Encounter for screening for malignant neoplasm of colon: Secondary | ICD-10-CM | POA: Diagnosis not present

## 2022-05-27 DIAGNOSIS — Z86010 Personal history of colonic polyps: Secondary | ICD-10-CM | POA: Diagnosis not present

## 2022-06-16 ENCOUNTER — Other Ambulatory Visit (HOSPITAL_COMMUNITY): Payer: Self-pay

## 2022-06-16 ENCOUNTER — Telehealth: Payer: Self-pay | Admitting: *Deleted

## 2022-06-16 MED ORDER — GOLYTELY 236 G PO SOLR
ORAL | 0 refills | Status: DC
  Filled 2022-06-16: qty 4000, 1d supply, fill #0

## 2022-06-16 NOTE — Telephone Encounter (Signed)
   Pre-operative Risk Assessment    Patient Name: Ryan BOZARD, MD  DOB: [birth year] MRN: [id number]    Request for Surgical Clearance    Procedure:  COLONOSCOPY   Date of Surgery:  Clearance 06/28/22                                 Surgeon:  Desma Maxim Surgeon's Group or Practice Name:  [facility]  Phone number:  [phone] Fax number:  [phone]   Type of Clearance Requested:   - Medical    Type of Anesthesia:   PROPAFOL    Additional requests/questions:   EVALUATE PATIENT HISTORY AND ADVICE OF ANY SPECIAL CONSIDERATION   Signed, Claude Manges   06/16/2022, 1:19 PM

## 2022-06-16 NOTE — Telephone Encounter (Signed)
Pt is scheduled for tele pre  op appt 06/18/22 @ 9:20. Pt is going to need to hold Plavix; this was not entered on the clearance when put into the chart. Med rec and consent are done      Patient Consent for Virtual Visit        [name], MD has provided verbal consent on 06/16/2022 for a virtual visit (video or telephone).   CONSENT FOR VIRTUAL VISIT FOR:  [name], MD  By participating in this virtual visit I agree to the following:  I hereby voluntarily request, consent and authorize [facility] and its employed or contracted physicians, physician assistants, nurse practitioners or other licensed health care professionals (the Practitioner), to provide me with telemedicine health care services (the "Services") as deemed necessary by the treating Practitioner. I acknowledge and consent to receive the Services by the Practitioner via telemedicine. I understand that the telemedicine visit will involve communicating with the Practitioner through live audiovisual communication technology and the disclosure of certain medical information by electronic transmission. I acknowledge that I have been given the opportunity to request an in-person assessment or other available alternative prior to the telemedicine visit and am voluntarily participating in the telemedicine visit.  I understand that I have the right to withhold or withdraw my consent to the use of telemedicine in the course of my care at any time, without affecting my right to future care or treatment, and that the Practitioner or I may terminate the telemedicine visit at any time. I understand that I have the right to inspect all information obtained and/or recorded in the course of the telemedicine visit and may receive copies of available information for a reasonable fee.  I understand that some of the potential risks of receiving the Services via telemedicine include:  Delay or interruption in medical evaluation due  to technological equipment failure or disruption; Information transmitted may not be sufficient (e.g. poor resolution of images) to allow for appropriate medical decision making by the Practitioner; and/or  In rare instances, security protocols could fail, causing a breach of personal health information.  Furthermore, I acknowledge that it is my responsibility to provide information about my medical history, conditions and care that is complete and accurate to the best of my ability. I acknowledge that Practitioner's advice, recommendations, and/or decision may be based on factors not within their control, such as incomplete or inaccurate data provided by me or distortions of diagnostic images or specimens that may result from electronic transmissions. I understand that the practice of medicine is not an exact science and that Practitioner makes no warranties or guarantees regarding treatment outcomes. I acknowledge that a copy of this consent can be made available to me via my patient portal ([facility]), or I can request a printed copy by calling the office of [facility].    I understand that my insurance will be billed for this visit.   I have read or had this consent read to me. I understand the contents of this consent, which adequately explains the benefits and risks of the Services being provided via telemedicine.  I have been provided ample opportunity to ask questions regarding this consent and the Services and have had my questions answered to my satisfaction. I give my informed consent for the services to be provided through the use of telemedicine in my medical care

## 2022-06-16 NOTE — Telephone Encounter (Signed)
   Name: Ryan Bombard, MD  DOB: Jun 03, 1950  MRN: [id number]  Primary Cardiologist: Sinclair Grooms, MD   Preoperative team, please contact this patient and set up a phone call appointment for further preoperative risk assessment. Please obtain consent and complete medication review. Thank you for your help.  I confirm that guidance regarding antiplatelet and oral anticoagulation therapy has been completed and, if necessary, noted below (none requested).    Lenna Sciara, NP 06/16/2022, 1:47 PM [facility]

## 2022-06-16 NOTE — Telephone Encounter (Signed)
Pt is scheduled for tele pre  op appt 06/18/22 @ 9:20. Pt is going to need to hold Plavix; this was not entered on the clearance when put into the chart. Med rec and consent are done..    Patient Consent for Virtual Visit        [name], MD has provided verbal consent on 06/16/2022 for a virtual visit (video or telephone).   CONSENT FOR VIRTUAL VISIT FOR:  [name], MD  By participating in this virtual visit I agree to the following:  I hereby voluntarily request, consent and authorize [facility] and its employed or contracted physicians, physician assistants, nurse practitioners or other licensed health care professionals (the Practitioner), to provide me with telemedicine health care services (the "Services") as deemed necessary by the treating Practitioner. I acknowledge and consent to receive the Services by the Practitioner via telemedicine. I understand that the telemedicine visit will involve communicating with the Practitioner through live audiovisual communication technology and the disclosure of certain medical information by electronic transmission. I acknowledge that I have been given the opportunity to request an in-person assessment or other available alternative prior to the telemedicine visit and am voluntarily participating in the telemedicine visit.  I understand that I have the right to withhold or withdraw my consent to the use of telemedicine in the course of my care at any time, without affecting my right to future care or treatment, and that the Practitioner or I may terminate the telemedicine visit at any time. I understand that I have the right to inspect all information obtained and/or recorded in the course of the telemedicine visit and may receive copies of available information for a reasonable fee.  I understand that some of the potential risks of receiving the Services via telemedicine include:  Delay or interruption in medical evaluation due  to technological equipment failure or disruption; Information transmitted may not be sufficient (e.g. poor resolution of images) to allow for appropriate medical decision making by the Practitioner; and/or  In rare instances, security protocols could fail, causing a breach of personal health information.  Furthermore, I acknowledge that it is my responsibility to provide information about my medical history, conditions and care that is complete and accurate to the best of my ability. I acknowledge that Practitioner's advice, recommendations, and/or decision may be based on factors not within their control, such as incomplete or inaccurate data provided by me or distortions of diagnostic images or specimens that may result from electronic transmissions. I understand that the practice of medicine is not an exact science and that Practitioner makes no warranties or guarantees regarding treatment outcomes. I acknowledge that a copy of this consent can be made available to me via my patient portal ([place]), or I can request a printed copy by calling the office of [facility].    I understand that my insurance will be billed for this visit.   I have read or had this consent read to me. I understand the contents of this consent, which adequately explains the benefits and risks of the Services being provided via telemedicine.  I have been provided ample opportunity to ask questions regarding this consent and the Services and have had my questions answered to my satisfaction. I give my informed consent for the services to be provided through the use of telemedicine in my medical care

## 2022-06-18 ENCOUNTER — Ambulatory Visit: Payer: Commercial Managed Care - PPO | Attending: Cardiology | Admitting: Physician Assistant

## 2022-06-18 DIAGNOSIS — Z01810 Encounter for preprocedural cardiovascular examination: Secondary | ICD-10-CM | POA: Diagnosis not present

## 2022-06-18 NOTE — Progress Notes (Signed)
Virtual Visit via Telephone Note   Because of Ryan Bombard, Ryan Walsh's co-morbid illnesses, he is at least at moderate risk for complications without adequate follow up.  This format is felt to be most appropriate for this patient at this time.  The patient did not have access to video technology/had technical difficulties with video requiring transitioning to audio format only (telephone).  All issues noted in this document were discussed and addressed.  No physical exam could be performed with this format.  Please refer to the patient's chart for his consent to telehealth for [facility].  Evaluation Performed:  Preoperative cardiovascular risk assessment _____________   Date:  06/18/2022   Patient ID:  Ryan Bombard, Ryan Walsh, DOB 1950-10-29, MRN [id number] Patient Location:  Home Provider location:   Office  Primary Care Provider:  Ginger Organ., Ryan Walsh Primary Cardiologist:  Sinclair Grooms, Ryan Walsh  Chief Complaint / Patient Profile   72 y.o. y/o male with a h/o chronic combined systolic and diastolic CHF, hypertension, MI 2004, insulin-dependent type 2 diabetes, hyperlipidemia, and sleep apnea who is pending colonoscopy and presents today for telephonic preoperative cardiovascular risk assessment.  History of Present Illness    Ryan Bombard, Ryan Walsh is a 72 y.o. male who presents via audio/video conferencing for a telehealth visit today.  Pt was last seen in cardiology clinic on 02/16/2022 by [name].  At that time Ryan Bombard, Ryan Walsh was doing well.  The patient is now pending procedure as outlined above. Since his last visit, he states he has been doing pretty good from a cardiovascular standpoint.  No chest pain or shortness of breath.  He has no issues with walking 1-2 blocks or going up and down a flight of stairs.  He enjoys gardening outdoors.  He does confirm that he is taking Plavix and there were no holding parameters indicated on the clearance request.  No  medications indicated as needing held. He is on plavix.  If needed, he can hold Plavix x 5 days prior to the procedure and restart when medically safe to do so.  Past Medical History    Past Medical History:  Diagnosis Date   Chronic combined systolic and diastolic CHF (congestive heart failure) (HCC)    Coronary atherosclerosis of native coronary artery    Dental crowns present    Essential hypertension, benign    states under control with meds., has been on med. x 15 yr.   GERD (gastroesophageal reflux disease)    Hepatitis    Drug induced hepatitis   MI (myocardial infarction) ([facility]) 10/27/2002   Non-insulin dependent type 2 diabetes mellitus ([place])    Obesity    Osteoarthritis    hips and knees   Pure hypercholesterolemia    Sleep apnea    Past Surgical History:  Procedure Laterality Date   ANKLE SURGERY Left    as a child   ANTERIOR CERVICAL DECOMP/DISCECTOMY FUSION Left 11/09/2021   Procedure: ANTERIOR CERVICAL DECOMPRESSION/DISCECTOMY FUSION 3 LEVELS C3-C6;  Surgeon: Melina Schools, Ryan Walsh;  Location: [facility];  Service: Orthopedics;  Laterality: Left;  4 hrs 3 C-Bed   ANTERIOR INTEROSSEOUS NERVE DECOMPRESSION Right 04/15/2021   Procedure: ANTERIOR INTEROSSEOUS NERVE DECOMPRESSION;  Surgeon: [name], Ryan Walsh;  Location: [facility];  Service: Orthopedics;  Laterality: Right;  Only need 1 hour for case,  Axillary block/Mac   CARDIAC CATHETERIZATION  10/27/2002   CARPAL TUNNEL RELEASE Right 11/21/2013   CARPAL TUNNEL RELEASE Right 04/15/2021  Procedure: CARPAL TUNNEL RELEASE;  Surgeon: [name], Ryan Walsh;  Location: [facility];  Service: Orthopedics;  Laterality: Right;   CORONARY STENT PLACEMENT  10/27/2002   mid LAD   TOTAL HIP ARTHROPLASTY Left 04/09/1999   TOTAL HIP ARTHROPLASTY Right    TOTAL KNEE ARTHROPLASTY Left 02/03/2011   TOTAL KNEE ARTHROPLASTY Right 05/11/2021   Procedure: TOTAL KNEE ARTHROPLASTY;  Surgeon: Gaynelle Arabian, Ryan Walsh;   Location: [facility];  Service: Orthopedics;  Laterality: Right;   ULNAR TUNNEL RELEASE Left 06/18/2016   Procedure: LEFT CUBITAL TUNNEL RELEASE;  Surgeon: [name], Ryan Walsh;  Location: [facility];  Service: Orthopedics;  Laterality: Left;    Allergies  No Known Allergies  Home Medications    Prior to Admission medications   Medication Sig Start Date End Date Taking? Authorizing Provider  amLODipine (NORVASC) 5 MG tablet Take 1 tablet (5 mg total) by mouth daily. 05/04/22   Belva Crome, Ryan Walsh  atorvastatin (LIPITOR) 40 MG tablet TAKE 1 TABLET BY MOUTH AT BEDTIME 07/09/21     calcium carbonate (TUMS - DOSED IN MG ELEMENTAL CALCIUM) 500 MG chewable tablet Chew 1,000 mg by mouth daily as needed for indigestion or heartburn.    Provider, Historical, Ryan Walsh  carvedilol (COREG) 25 MG tablet Take 1 tablet (25 mg total) by mouth 2 (two) times daily with a meal. 05/04/22   Belva Crome, Ryan Walsh  celecoxib (CELEBREX) 200 MG capsule Take 1 capsule (200 mg total) by mouth at bedtime. 02/04/22     clopidogrel (PLAVIX) 75 MG tablet Take 1 tablet by mouth once daily 01/28/22     dapagliflozin propanediol (FARXIGA) 10 MG TABS tablet Take 1 tablet by mouth once daily 11/30/21     diphenhydrAMINE (SOMINEX) 25 MG tablet Take 50 mg by mouth at bedtime as needed for sleep.    Provider, Historical, Ryan Walsh  Dulaglutide (TRULICITY) 3 PZ/0.2HE SOPN Inject 3 mg into the skin once a week 11/30/21     ezetimibe (ZETIA) 10 MG tablet Take 1 tablet (10 mg total) by mouth daily. 02/16/22   Belva Crome, Ryan Walsh  gabapentin (NEURONTIN) 300 MG capsule Take 1 capsule by mouth 3 times a day for 2 weeks following surgery.Then take 1 capsule twice daily for 2 weeks. Then take 1 capsule once daily for 2 weeks. Then discontinue. Patient taking differently: Take 300 mg by mouth daily as needed (pain). 05/12/21   Edmisten, Kristie L, PA  MELATONIN PO Take 15 mg by mouth at bedtime as needed (sleep).    Provider, Historical, Ryan Walsh  metFORMIN  (GLUCOPHAGE-XR) 500 MG 24 hr tablet Take 2 tablets by mouth twice daily 01/11/22     ondansetron (ZOFRAN) 4 MG tablet Take 1 tablet (4 mg total) by mouth every 8 (eight) hours as needed for nausea or vomiting. 11/09/21   Melina Schools, Ryan Walsh  polyethylene glycol (GOLYTELY) 236 g solution Use as directed 06/16/22     ramipril (ALTACE) 10 MG capsule Take 1 capsule (10 mg total) by mouth 2 (two) times daily. 01/11/22     tiZANidine (ZANAFLEX) 4 MG tablet Take 1 tablet by mouth twice a day 02/04/22     triamterene-hydrochlorothiazide (DYAZIDE) 37.5-25 MG capsule Take 1 each (1 capsule total) by mouth every morning. 05/04/22   Belva Crome, Ryan Walsh    Physical Exam    Vital Signs:  Ryan Bombard, Ryan Walsh does not have vital signs available for review today.  Given telephonic nature of communication, physical exam is limited. AAOx3.  NAD. Normal affect.  Speech and respirations are unlabored.  Accessory Clinical Findings    None  Assessment & Plan    1.  Preoperative Cardiovascular Risk Assessment:   Mr. Rezabek perioperative risk of a major cardiac event is 11% according to the Revised Cardiac Risk Index (RCRI).  Therefore, he is at high risk for perioperative complications.   His functional capacity is good at 5.62 METs according to the Duke Activity Status Index (DASI). Recommendations: According to ACC/AHA guidelines, no further cardiovascular testing needed.  The patient may proceed to surgery at acceptable risk.   Antiplatelet and/or Anticoagulation Recommendations: Clopidogrel (Plavix) can be held for 5 days prior to his surgery and resumed as soon as possible post op.  A copy of this note will be routed to requesting surgeon.  Time:   Today, I have spent 5 minutes with the patient with telehealth technology discussing medical history, symptoms, and management plan.     Elgie Collard, PA-C  06/18/2022, 11:37 AM

## 2022-06-24 ENCOUNTER — Other Ambulatory Visit: Payer: Self-pay

## 2022-06-24 ENCOUNTER — Other Ambulatory Visit (HOSPITAL_COMMUNITY): Payer: Self-pay

## 2022-06-24 ENCOUNTER — Encounter (HOSPITAL_COMMUNITY): Payer: Self-pay

## 2022-06-24 MED ORDER — DAPAGLIFLOZIN PROPANEDIOL 10 MG PO TABS
10.0000 mg | ORAL_TABLET | Freq: Every day | ORAL | 1 refills | Status: DC
  Filled 2022-06-24 – 2022-06-25 (×2): qty 90, 90d supply, fill #0
  Filled 2022-07-24 – 2022-09-23 (×2): qty 90, 90d supply, fill #1
  Filled 2022-09-24: qty 30, 30d supply, fill #1
  Filled 2022-10-08: qty 90, 90d supply, fill #1

## 2022-06-25 ENCOUNTER — Other Ambulatory Visit (HOSPITAL_COMMUNITY): Payer: Self-pay

## 2022-06-25 ENCOUNTER — Ambulatory Visit: Payer: 59 | Admitting: Podiatry

## 2022-06-28 ENCOUNTER — Ambulatory Visit: Payer: 59 | Admitting: Podiatry

## 2022-06-28 ENCOUNTER — Other Ambulatory Visit (HOSPITAL_COMMUNITY): Payer: Self-pay

## 2022-06-30 ENCOUNTER — Other Ambulatory Visit (HOSPITAL_COMMUNITY): Payer: Self-pay

## 2022-06-30 ENCOUNTER — Other Ambulatory Visit: Payer: Self-pay

## 2022-06-30 DIAGNOSIS — Z4889 Encounter for other specified surgical aftercare: Secondary | ICD-10-CM | POA: Diagnosis not present

## 2022-06-30 MED ORDER — CELECOXIB 200 MG PO CAPS
200.0000 mg | ORAL_CAPSULE | Freq: Every day | ORAL | 2 refills | Status: DC
  Filled 2022-06-30: qty 30, 30d supply, fill #0
  Filled 2022-07-24: qty 30, 30d supply, fill #1
  Filled 2022-08-04 – 2022-09-23 (×2): qty 30, 30d supply, fill #2

## 2022-07-01 ENCOUNTER — Other Ambulatory Visit: Payer: Self-pay

## 2022-07-01 ENCOUNTER — Other Ambulatory Visit (HOSPITAL_COMMUNITY): Payer: Self-pay

## 2022-07-01 MED ORDER — TIZANIDINE HCL 4 MG PO TABS
4.0000 mg | ORAL_TABLET | Freq: Two times a day (BID) | ORAL | 0 refills | Status: DC
  Filled 2022-07-01: qty 30, 15d supply, fill #0

## 2022-07-02 ENCOUNTER — Ambulatory Visit: Payer: 59 | Admitting: Podiatry

## 2022-07-05 ENCOUNTER — Ambulatory Visit (INDEPENDENT_AMBULATORY_CARE_PROVIDER_SITE_OTHER): Payer: Commercial Managed Care - PPO | Admitting: Podiatry

## 2022-07-05 ENCOUNTER — Encounter: Payer: Self-pay | Admitting: Podiatry

## 2022-07-05 ENCOUNTER — Ambulatory Visit: Payer: Commercial Managed Care - PPO | Admitting: Podiatry

## 2022-07-05 DIAGNOSIS — M79674 Pain in right toe(s): Secondary | ICD-10-CM | POA: Diagnosis not present

## 2022-07-05 DIAGNOSIS — M79675 Pain in left toe(s): Secondary | ICD-10-CM

## 2022-07-05 DIAGNOSIS — B351 Tinea unguium: Secondary | ICD-10-CM | POA: Diagnosis not present

## 2022-07-05 NOTE — Progress Notes (Signed)
Subjective:   Patient ID: Ryan Bombard, MD, male   DOB: 72 y.o.   MRN: [id number]   HPI Patient presents with elongated nailbeds 1-5 both feet that are thick yellow brittle and they can become painful   ROS      Objective:  Physical Exam  Neurovascular status intact thick yellow brittle nailbeds 1-5 both feet thickened incurvated     Assessment:  Mycotic nail infection with moderate discomfort 1-5 both feet     Plan:  Debridement nailbeds 1-5 both feet no angiogenic bleeding reappoint routine care

## 2022-07-06 ENCOUNTER — Other Ambulatory Visit: Payer: Self-pay | Admitting: Gastroenterology

## 2022-07-24 ENCOUNTER — Other Ambulatory Visit (HOSPITAL_COMMUNITY): Payer: Self-pay

## 2022-07-26 ENCOUNTER — Other Ambulatory Visit: Payer: Self-pay

## 2022-07-26 ENCOUNTER — Other Ambulatory Visit (HOSPITAL_COMMUNITY): Payer: Self-pay

## 2022-07-26 MED ORDER — ATORVASTATIN CALCIUM 40 MG PO TABS
40.0000 mg | ORAL_TABLET | Freq: Every day | ORAL | 1 refills | Status: DC
  Filled 2022-07-26: qty 90, 90d supply, fill #0
  Filled 2022-09-23: qty 90, 90d supply, fill #1

## 2022-07-27 ENCOUNTER — Other Ambulatory Visit: Payer: Self-pay

## 2022-07-27 ENCOUNTER — Other Ambulatory Visit (HOSPITAL_COMMUNITY): Payer: Self-pay

## 2022-07-27 ENCOUNTER — Encounter (HOSPITAL_COMMUNITY): Payer: Self-pay

## 2022-07-28 ENCOUNTER — Other Ambulatory Visit (HOSPITAL_COMMUNITY): Payer: Self-pay

## 2022-07-28 ENCOUNTER — Other Ambulatory Visit: Payer: Self-pay

## 2022-07-30 ENCOUNTER — Other Ambulatory Visit (HOSPITAL_COMMUNITY): Payer: Self-pay

## 2022-07-30 ENCOUNTER — Other Ambulatory Visit: Payer: Self-pay

## 2022-08-04 ENCOUNTER — Other Ambulatory Visit (HOSPITAL_COMMUNITY): Payer: Self-pay

## 2022-08-16 ENCOUNTER — Other Ambulatory Visit (HOSPITAL_COMMUNITY): Payer: Self-pay

## 2022-08-20 ENCOUNTER — Other Ambulatory Visit (HOSPITAL_COMMUNITY): Payer: Self-pay

## 2022-08-23 ENCOUNTER — Other Ambulatory Visit: Payer: Self-pay

## 2022-08-23 ENCOUNTER — Encounter (HOSPITAL_COMMUNITY): Payer: Self-pay

## 2022-08-23 ENCOUNTER — Other Ambulatory Visit (HOSPITAL_COMMUNITY): Payer: Self-pay

## 2022-08-26 ENCOUNTER — Other Ambulatory Visit: Payer: Self-pay

## 2022-08-27 ENCOUNTER — Other Ambulatory Visit (HOSPITAL_COMMUNITY): Payer: Self-pay

## 2022-08-28 ENCOUNTER — Other Ambulatory Visit (HOSPITAL_COMMUNITY): Payer: Self-pay

## 2022-09-17 ENCOUNTER — Encounter (HOSPITAL_COMMUNITY): Payer: Self-pay | Admitting: Gastroenterology

## 2022-09-23 ENCOUNTER — Other Ambulatory Visit (HOSPITAL_COMMUNITY): Payer: Self-pay

## 2022-09-23 ENCOUNTER — Other Ambulatory Visit: Payer: Self-pay

## 2022-09-24 ENCOUNTER — Other Ambulatory Visit (HOSPITAL_COMMUNITY): Payer: Self-pay

## 2022-09-24 MED ORDER — RAMIPRIL 10 MG PO CAPS
10.0000 mg | ORAL_CAPSULE | Freq: Two times a day (BID) | ORAL | 3 refills | Status: DC
  Filled 2022-09-24 – 2022-12-02 (×2): qty 180, 90d supply, fill #0
  Filled 2023-01-15: qty 180, 90d supply, fill #1
  Filled 2023-02-21: qty 60, 30d supply, fill #1
  Filled 2023-03-21 – 2023-03-22 (×3): qty 60, 30d supply, fill #2
  Filled 2023-04-14: qty 60, 30d supply, fill #3

## 2022-09-24 MED ORDER — CLOPIDOGREL BISULFATE 75 MG PO TABS
75.0000 mg | ORAL_TABLET | Freq: Every day | ORAL | 3 refills | Status: DC
  Filled 2022-09-24 – 2022-12-02 (×2): qty 90, 90d supply, fill #0
  Filled 2023-02-17: qty 90, 90d supply, fill #1
  Filled 2023-02-22: qty 30, 30d supply, fill #1

## 2022-09-24 MED ORDER — METFORMIN HCL ER 500 MG PO TB24
1000.0000 mg | ORAL_TABLET | Freq: Two times a day (BID) | ORAL | 2 refills | Status: DC
  Filled 2022-09-24 – 2022-12-02 (×2): qty 360, 90d supply, fill #0
  Filled 2023-02-21: qty 120, 30d supply, fill #1
  Filled 2023-04-14: qty 120, 30d supply, fill #2
  Filled 2023-05-30: qty 120, 30d supply, fill #3
  Filled 2023-07-27: qty 120, 30d supply, fill #5
  Filled 2023-08-25: qty 120, 30d supply, fill #6

## 2022-09-27 ENCOUNTER — Other Ambulatory Visit: Payer: Self-pay

## 2022-09-27 ENCOUNTER — Encounter: Payer: Self-pay | Admitting: Pharmacist

## 2022-09-27 ENCOUNTER — Other Ambulatory Visit (HOSPITAL_COMMUNITY): Payer: Self-pay

## 2022-09-28 ENCOUNTER — Other Ambulatory Visit (HOSPITAL_COMMUNITY): Payer: Self-pay

## 2022-10-01 ENCOUNTER — Other Ambulatory Visit: Payer: Self-pay

## 2022-10-04 ENCOUNTER — Encounter: Payer: Self-pay | Admitting: Podiatry

## 2022-10-04 ENCOUNTER — Ambulatory Visit (INDEPENDENT_AMBULATORY_CARE_PROVIDER_SITE_OTHER): Payer: Medicare Other | Admitting: Podiatry

## 2022-10-04 DIAGNOSIS — E1151 Type 2 diabetes mellitus with diabetic peripheral angiopathy without gangrene: Secondary | ICD-10-CM

## 2022-10-04 DIAGNOSIS — E1169 Type 2 diabetes mellitus with other specified complication: Secondary | ICD-10-CM | POA: Diagnosis not present

## 2022-10-04 DIAGNOSIS — M542 Cervicalgia: Secondary | ICD-10-CM | POA: Diagnosis not present

## 2022-10-04 DIAGNOSIS — B351 Tinea unguium: Secondary | ICD-10-CM | POA: Diagnosis not present

## 2022-10-04 DIAGNOSIS — M4712 Other spondylosis with myelopathy, cervical region: Secondary | ICD-10-CM | POA: Diagnosis not present

## 2022-10-04 DIAGNOSIS — Z4889 Encounter for other specified surgical aftercare: Secondary | ICD-10-CM | POA: Diagnosis not present

## 2022-10-04 NOTE — Progress Notes (Signed)
Subjective:   Patient ID: Ryan Bad, MD, male   DOB: 72 y.o.   MRN: [id number]   HPI Patient presents long-term diabetes with thick yellow brittle nailbeds 1-5 both feet that are incurvated in the corners gets sore and he has routine trimming   ROS      Objective:  Physical Exam  Neurovascular status unchanged with thick yellow brittle nailbeds 1-5 both feet that can be painful with shoe gear     Assessment:  Chronic mycotic nail infection 1-5 both feet with discomfort     Plan:  H&P done debrided nailbeds 1-5 both feet no angiogenic bleeding reappoint routine care

## 2022-10-08 ENCOUNTER — Other Ambulatory Visit (HOSPITAL_COMMUNITY): Payer: Self-pay

## 2022-10-15 ENCOUNTER — Other Ambulatory Visit (HOSPITAL_COMMUNITY): Payer: Self-pay

## 2022-10-15 DIAGNOSIS — Z86010 Personal history of colonic polyps: Secondary | ICD-10-CM | POA: Diagnosis not present

## 2022-10-15 DIAGNOSIS — I2510 Atherosclerotic heart disease of native coronary artery without angina pectoris: Secondary | ICD-10-CM | POA: Diagnosis not present

## 2022-10-15 DIAGNOSIS — R972 Elevated prostate specific antigen [PSA]: Secondary | ICD-10-CM | POA: Diagnosis not present

## 2022-10-15 DIAGNOSIS — M545 Low back pain, unspecified: Secondary | ICD-10-CM | POA: Diagnosis not present

## 2022-10-15 DIAGNOSIS — M159 Polyosteoarthritis, unspecified: Secondary | ICD-10-CM | POA: Diagnosis not present

## 2022-10-15 DIAGNOSIS — I110 Hypertensive heart disease with heart failure: Secondary | ICD-10-CM | POA: Diagnosis not present

## 2022-10-15 DIAGNOSIS — M5412 Radiculopathy, cervical region: Secondary | ICD-10-CM | POA: Diagnosis not present

## 2022-10-15 DIAGNOSIS — E785 Hyperlipidemia, unspecified: Secondary | ICD-10-CM | POA: Diagnosis not present

## 2022-10-15 DIAGNOSIS — E1165 Type 2 diabetes mellitus with hyperglycemia: Secondary | ICD-10-CM | POA: Diagnosis not present

## 2022-10-15 DIAGNOSIS — I5022 Chronic systolic (congestive) heart failure: Secondary | ICD-10-CM | POA: Diagnosis not present

## 2022-10-15 MED ORDER — MOUNJARO 12.5 MG/0.5ML ~~LOC~~ SOAJ: 12.5000 mg | SUBCUTANEOUS | 11 refills | Status: DC

## 2022-10-15 MED ORDER — MOUNJARO 10 MG/0.5ML ~~LOC~~ SOAJ
10.0000 mg | SUBCUTANEOUS | 0 refills | Status: DC
  Filled 2023-01-15 – 2023-01-31 (×2): qty 2, 28d supply, fill #0

## 2022-10-15 MED ORDER — MOUNJARO 7.5 MG/0.5ML ~~LOC~~ SOAJ
7.5000 mg | SUBCUTANEOUS | 0 refills | Status: DC
  Filled 2022-10-15: qty 2, 28d supply, fill #0

## 2022-10-16 DIAGNOSIS — M5412 Radiculopathy, cervical region: Secondary | ICD-10-CM | POA: Diagnosis not present

## 2022-10-16 DIAGNOSIS — M542 Cervicalgia: Secondary | ICD-10-CM | POA: Diagnosis not present

## 2022-10-18 ENCOUNTER — Other Ambulatory Visit: Payer: Self-pay

## 2022-10-19 ENCOUNTER — Other Ambulatory Visit: Payer: Self-pay | Admitting: Obstetrics

## 2022-10-20 ENCOUNTER — Other Ambulatory Visit (HOSPITAL_COMMUNITY): Payer: Self-pay

## 2022-10-22 ENCOUNTER — Other Ambulatory Visit (HOSPITAL_COMMUNITY): Payer: Self-pay

## 2022-10-22 DIAGNOSIS — H6123 Impacted cerumen, bilateral: Secondary | ICD-10-CM | POA: Diagnosis not present

## 2022-10-29 DIAGNOSIS — M5412 Radiculopathy, cervical region: Secondary | ICD-10-CM | POA: Diagnosis not present

## 2022-11-02 DIAGNOSIS — M4712 Other spondylosis with myelopathy, cervical region: Secondary | ICD-10-CM | POA: Diagnosis not present

## 2022-11-05 ENCOUNTER — Telehealth: Payer: Self-pay | Admitting: Interventional Cardiology

## 2022-11-05 NOTE — Telephone Encounter (Signed)
   Pre-operative Risk Assessment    Patient Name: MANARD GAILES, MD  DOB: [birth year] MRN: [id number]      Request for Surgical Clearance    Procedure:   C1 to C3 posterior spinal fusion  Date of Surgery:  Clearance 11/19/22                                 Surgeon:  Dr. Diamantina [facility] Surgeon's Group or Practice Name:  [facility]  Phone number:  [phone] Fax number:  [phone]   Type of Clearance Requested:   - Medical    Type of Anesthesia:   N/A   Additional requests/questions:   Anesthesia is requesting patient's last echo also be faxed to [phone].  Minna Antis   11/05/2022, 2:04 PM

## 2022-11-05 NOTE — Telephone Encounter (Signed)
Spoke with Annabelle Harman at [facility] and she states that she doesn't know if patient needs to hold his Plavix and that usually comes from the surgeon's office.  I called Dr. Lorenso Courier office ([facility]) at [phone] and their office is closed for the day and will not open back on Monday 6/17. Will try to call office on Monday to get clarity on holding Plavix.

## 2022-11-09 ENCOUNTER — Telehealth: Payer: Self-pay | Admitting: *Deleted

## 2022-11-09 DIAGNOSIS — Z951 Presence of aortocoronary bypass graft: Secondary | ICD-10-CM | POA: Diagnosis not present

## 2022-11-09 DIAGNOSIS — Z7901 Long term (current) use of anticoagulants: Secondary | ICD-10-CM | POA: Diagnosis not present

## 2022-11-09 DIAGNOSIS — I10 Essential (primary) hypertension: Secondary | ICD-10-CM | POA: Diagnosis not present

## 2022-11-09 DIAGNOSIS — E119 Type 2 diabetes mellitus without complications: Secondary | ICD-10-CM | POA: Diagnosis not present

## 2022-11-09 DIAGNOSIS — M5001 Cervical disc disorder with myelopathy,  high cervical region: Secondary | ICD-10-CM | POA: Diagnosis not present

## 2022-11-09 DIAGNOSIS — Z01818 Encounter for other preprocedural examination: Secondary | ICD-10-CM | POA: Diagnosis not present

## 2022-11-09 DIAGNOSIS — I2510 Atherosclerotic heart disease of native coronary artery without angina pectoris: Secondary | ICD-10-CM | POA: Diagnosis not present

## 2022-11-09 DIAGNOSIS — E785 Hyperlipidemia, unspecified: Secondary | ICD-10-CM | POA: Diagnosis not present

## 2022-11-09 NOTE — Telephone Encounter (Signed)
I called the requesting office to confirm if they will need Plavix to be held for procedure. See previous notes. I left a vm to call back in regard to Plavix if it is needing to be held or not.   I will fax notes over to requesting office as FYI to please reply.

## 2022-11-09 NOTE — Telephone Encounter (Signed)
Left vm for the pt to call back to schedule a tele preop appt.

## 2022-11-09 NOTE — Telephone Encounter (Signed)
Patient returned Pre-op call. 

## 2022-11-09 NOTE — Telephone Encounter (Signed)
   Name: Ryan Bad, MD  DOB: [birth year]  MRN: [id number]  Primary Cardiologist: Lesleigh Noe, MD (Inactive)   Preoperative team, please contact this patient and set up a phone call appointment for further preoperative risk assessment. Please obtain consent and complete medication review. Thank you for your help.  I confirm that guidance regarding antiplatelet and oral anticoagulation therapy has been completed and, if necessary, noted below.  Per office protocol, if patient is without any new symptoms or concerns at the time of their virtual visit, he may hold Plavix for 5 days prior to procedure. Please resume Plavix as soon as possible postprocedure, at the discretion of the surgeon.     Joylene Grapes, NP 11/09/2022, 1:15 PM [facility]

## 2022-11-09 NOTE — Telephone Encounter (Signed)
Pt added on Thursday for tele appt due to med hold and proc date. 11/11/22 @ 3 pm. Med rec and consent are done

## 2022-11-09 NOTE — Telephone Encounter (Signed)
Pt added on Thursday for tele appt due to med hold and proc date. 11/11/22 @ 3 pm. Med rec and consent are done.     Patient Consent for Virtual Visit        [name], MD has provided verbal consent on 11/09/2022 for a virtual visit (video or telephone).   CONSENT FOR VIRTUAL VISIT FOR:  [name], MD  By participating in this virtual visit I agree to the following:  I hereby voluntarily request, consent and authorize [facility] and its employed or contracted physicians, physician assistants, nurse practitioners or other licensed health care professionals (the Practitioner), to provide me with telemedicine health care services (the "Services") as deemed necessary by the treating Practitioner. I acknowledge and consent to receive the Services by the Practitioner via telemedicine. I understand that the telemedicine visit will involve communicating with the Practitioner through live audiovisual communication technology and the disclosure of certain medical information by electronic transmission. I acknowledge that I have been given the opportunity to request an in-person assessment or other available alternative prior to the telemedicine visit and am voluntarily participating in the telemedicine visit.  I understand that I have the right to withhold or withdraw my consent to the use of telemedicine in the course of my care at any time, without affecting my right to future care or treatment, and that the Practitioner or I may terminate the telemedicine visit at any time. I understand that I have the right to inspect all information obtained and/or recorded in the course of the telemedicine visit and may receive copies of available information for a reasonable fee.  I understand that some of the potential risks of receiving the Services via telemedicine include:  Delay or interruption in medical evaluation due to technological equipment failure or disruption; Information  transmitted may not be sufficient (e.g. poor resolution of images) to allow for appropriate medical decision making by the Practitioner; and/or  In rare instances, security protocols could fail, causing a breach of personal health information.  Furthermore, I acknowledge that it is my responsibility to provide information about my medical history, conditions and care that is complete and accurate to the best of my ability. I acknowledge that Practitioner's advice, recommendations, and/or decision may be based on factors not within their control, such as incomplete or inaccurate data provided by me or distortions of diagnostic images or specimens that may result from electronic transmissions. I understand that the practice of medicine is not an exact science and that Practitioner makes no warranties or guarantees regarding treatment outcomes. I acknowledge that a copy of this consent can be made available to me via my patient portal [facility] MyChart), or I can request a printed copy by calling the office of [facility].    I understand that my insurance will be billed for this visit.   I have read or had this consent read to me. I understand the contents of this consent, which adequately explains the benefits and risks of the Services being provided via telemedicine.  I have been provided ample opportunity to ask questions regarding this consent and the Services and have had my questions answered to my satisfaction. I give my informed consent for the services to be provided through the use of telemedicine in my medical care  .

## 2022-11-09 NOTE — Telephone Encounter (Signed)
Requesting office called back and said they will need the pt to hold Plavix x 5 days prior

## 2022-11-09 NOTE — Telephone Encounter (Signed)
Pt called back but I was unavailable. Pt asked if I could call him back in about 30 minutes.

## 2022-11-11 ENCOUNTER — Ambulatory Visit: Payer: Medicare Other | Attending: Cardiology

## 2022-11-11 ENCOUNTER — Other Ambulatory Visit (HOSPITAL_COMMUNITY): Payer: Self-pay

## 2022-11-11 DIAGNOSIS — Z01810 Encounter for preprocedural cardiovascular examination: Secondary | ICD-10-CM

## 2022-11-11 NOTE — Telephone Encounter (Signed)
Patient is calling because he states that his call was disconnected during tele appt. Requesting return call.

## 2022-11-11 NOTE — Progress Notes (Signed)
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  This format is felt to be most appropriate for this patient at this time.  The patient did not have access to video technology/had technical difficulties with video requiring transitioning to audio format only (telephone).  All issues noted in this document were discussed and addressed.  No physical exam could be performed with this format.  Please refer to the patient's chart for his consent to telehealth for [name].  Evaluation Performed:  Preoperative cardiovascular risk assessment _____________   Date:  11/11/2022   Patient ID:  Ryan Bad, MD, DOB 07-01-1950, MRN [id number] Patient Location:  Home Provider location:   Office  Primary Care Provider:  Cleatis Polka., MD Primary Cardiologist:  Lesleigh Noe, MD (Inactive)  Chief Complaint / Patient Profile   72 y.o. y/o male with a h/o coronary artery disease, combined systolic and diastolic CHF, hyperlipidemia, essential hypertension who is pending C1-C3 posterior spinal fusion and presents today for telephonic preoperative cardiovascular risk assessment.  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  Pt was last seen in cardiology clinic on 02/16/22 by Dr. Katrinka Blazing.  At that time Ryan Bad, MD was doing well .  The patient is now pending procedure as outlined above. Since his last visit, he he continues to do well from a cardiac standpoint.  Today he denies chest pain, shortness of breath, lower extremity edema, fatigue, palpitations, melena, hematuria, hemoptysis, diaphoresis, weakness, presyncope, syncope, orthopnea, and PND.   Past Medical History    Past Medical History:  Diagnosis Date   Chronic combined systolic and diastolic CHF  (congestive heart failure) (HCC)    Coronary atherosclerosis of native coronary artery    Dental crowns present    Essential hypertension, benign    states under control with meds., has been on med. x 15 yr.   GERD (gastroesophageal reflux disease)    Hepatitis    Drug induced hepatitis   MI (myocardial infarction) (HCC) 10/27/2002   Non-insulin dependent type 2 diabetes mellitus (HCC)    Obesity    Osteoarthritis    hips and knees   Pure hypercholesterolemia    Sleep apnea    Past Surgical History:  Procedure Laterality Date   ANKLE SURGERY Left    as a child   ANTERIOR CERVICAL DECOMP/DISCECTOMY FUSION Left 11/09/2021   Procedure: ANTERIOR CERVICAL DECOMPRESSION/DISCECTOMY FUSION 3 LEVELS C3-C6;  Surgeon: Venita Lick, MD;  Location: [facility] OR;  Service: Orthopedics;  Laterality: Left;  4 hrs 3 C-Bed   ANTERIOR INTEROSSEOUS NERVE DECOMPRESSION Right 04/15/2021   Procedure: ANTERIOR INTEROSSEOUS NERVE DECOMPRESSION;  Surgeon: Dairl Ponder, MD;  Location: [facility];  Service: Orthopedics;  Laterality: Right;  Only need 1 hour for case,  Axillary block/Mac   CARDIAC CATHETERIZATION  10/27/2002   CARPAL TUNNEL RELEASE Right 11/21/2013   CARPAL TUNNEL RELEASE Right 04/15/2021   Procedure: CARPAL TUNNEL RELEASE;  Surgeon: Dairl Ponder, MD;  Location: [facility];  Service: Orthopedics;  Laterality: Right;   CORONARY STENT PLACEMENT  10/27/2002   mid LAD   TOTAL HIP ARTHROPLASTY Left 04/09/1999   TOTAL HIP ARTHROPLASTY Right    TOTAL KNEE ARTHROPLASTY Left 02/03/2011   TOTAL KNEE ARTHROPLASTY Right 05/11/2021  Procedure: TOTAL KNEE ARTHROPLASTY;  Surgeon: Ollen Gross, MD;  Location: [facility];  Service: Orthopedics;  Laterality: Right;   ULNAR TUNNEL RELEASE Left 06/18/2016   Procedure: LEFT CUBITAL TUNNEL RELEASE;  Surgeon: Dairl Ponder, MD;  Location: [facility];  Service: Orthopedics;  Laterality: Left;    Allergies  No  Known Allergies  Home Medications    Prior to Admission medications   Medication Sig Start Date End Date Taking? Authorizing Provider  amLODipine (NORVASC) 5 MG tablet Take 1 tablet (5 mg total) by mouth daily. 05/04/22   Lyn Records, MD  atorvastatin (LIPITOR) 40 MG tablet Take 1 tablet (40 mg total) by mouth at bedtime. 07/25/22     calcium carbonate (TUMS - DOSED IN MG ELEMENTAL CALCIUM) 500 MG chewable tablet Chew 1,000 mg by mouth daily as needed for indigestion or heartburn.    [provider]  carvedilol (COREG) 25 MG tablet Take 1 tablet (25 mg total) by mouth 2 (two) times daily with a meal. 05/04/22   Lyn Records, MD  celecoxib (CELEBREX) 200 MG capsule Take 1 capsule (200 mg total) by mouth at bedtime. Patient not taking: Reported on 11/09/2022 02/04/22     celecoxib (CELEBREX) 200 MG capsule Take 1 capsule (200 mg total) by mouth at bedtime. 06/30/22     clopidogrel (PLAVIX) 75 MG tablet Take 1 tablet (75 mg total) by mouth daily. 09/23/22     dapagliflozin propanediol (FARXIGA) 10 MG TABS tablet Take 1 tablet by mouth once daily 06/24/22     diphenhydrAMINE (SOMINEX) 25 MG tablet Take 50 mg by mouth at bedtime as needed for sleep. Patient not taking: Reported on 11/09/2022    [provider]  Dulaglutide (TRULICITY) 3 MG/0.5ML SOPN Inject 3 mg into the skin once a week 11/30/21     ezetimibe (ZETIA) 10 MG tablet Take 1 tablet (10 mg total) by mouth daily. 02/16/22   Lyn Records, MD  gabapentin (NEURONTIN) 300 MG capsule Take 1 capsule by mouth 3 times a day for 2 weeks following surgery.Then take 1 capsule twice daily for 2 weeks. Then take 1 capsule once daily for 2 weeks. Then discontinue. Patient not taking: Reported on 11/09/2022 05/12/21   Edmisten, Kristie L, PA  MELATONIN PO Take 15 mg by mouth at bedtime as needed (sleep).    [provider]  metFORMIN (GLUCOPHAGE-XR) 500 MG 24 hr tablet Take 2 tablets (1,000 mg total) by mouth 2 (two) times daily.  09/23/22     ondansetron (ZOFRAN) 4 MG tablet Take 1 tablet (4 mg total) by mouth every 8 (eight) hours as needed for nausea or vomiting. Patient not taking: Reported on 11/09/2022 11/09/21   Venita Lick, MD  polyethylene glycol (GOLYTELY) 236 g solution Use as directed 06/16/22     ramipril (ALTACE) 10 MG capsule Take 1 capsule (10 mg total) by mouth 2 (two) times daily. 09/23/22     tirzepatide (MOUNJARO) 10 MG/0.5ML Pen Inject 10 mg into the skin once a week. Patient not taking: Reported on 11/09/2022 10/15/22     tirzepatide (MOUNJARO) 12.5 MG/0.5ML Pen Inject 12.5 mg into the skin once a week. Patient not taking: Reported on 11/09/2022 10/15/22     tirzepatide (MOUNJARO) 7.5 MG/0.5ML Pen Inject 7.5 mg into the skin once a week. Patient not taking: Reported on 11/09/2022 10/15/22     tiZANidine (ZANAFLEX) 4 MG tablet Take 1 tablet (4 mg total) by mouth 2 (two) times daily. Patient not taking:  Reported on 11/09/2022 07/01/22     triamterene-hydrochlorothiazide (DYAZIDE) 37.5-25 MG capsule Take 1 each (1 capsule total) by mouth every morning. 05/04/22   Lyn Records, MD    Physical Exam    Vital Signs:  Ryan Bad, MD does not have vital signs available for review today.  Given telephonic nature of communication, physical exam is limited. AAOx3. NAD. Normal affect.  Speech and respirations are unlabored.  Accessory Clinical Findings    None  Assessment & Plan    1.  Preoperative Cardiovascular Risk Assessment: C1-C3 posterior spinal fusion Dr. Kathlene [facility] [phone]      Primary Cardiologist: Lesleigh Noe, MD (Inactive)  Chart reviewed as part of pre-operative protocol coverage. Given past medical history and time since last visit, based on ACC/AHA guidelines, Ryan Bad, MD would be at acceptable risk for the planned procedure without further cardiovascular testing.   His RCRI is a class III risk, 6.6% risk of major cardiac event.  He is able to complete  greater than 4 METS of physical activity.  His Plavix may be held for 5 days prior to his procedure.  Please resume as soon as hemostasis is achieved.  Patient was advised that if he develops new symptoms prior to surgery to contact our office to arrange a follow-up appointment.  He verbalized understanding.  I will route this recommendation to the requesting party via Epic fax function and remove from pre-op pool.      Time:   Today, I have spent 5 minutes with the patient with telehealth technology discussing medical history, symptoms, and management plan.  Prior to his phone evaluation I spent greater than 10 minutes reviewing his past medical history and cardiac medications.   Ronney Asters, NP  11/11/2022, 8:25 AM

## 2022-11-19 DIAGNOSIS — M48061 Spinal stenosis, lumbar region without neurogenic claudication: Secondary | ICD-10-CM | POA: Diagnosis not present

## 2022-11-19 DIAGNOSIS — Z955 Presence of coronary angioplasty implant and graft: Secondary | ICD-10-CM | POA: Diagnosis not present

## 2022-11-19 DIAGNOSIS — G992 Myelopathy in diseases classified elsewhere: Secondary | ICD-10-CM | POA: Diagnosis not present

## 2022-11-19 DIAGNOSIS — G9589 Other specified diseases of spinal cord: Secondary | ICD-10-CM | POA: Diagnosis not present

## 2022-11-19 DIAGNOSIS — M50823 Other cervical disc disorders at C6-C7 level: Secondary | ICD-10-CM | POA: Diagnosis not present

## 2022-11-19 DIAGNOSIS — M4802 Spinal stenosis, cervical region: Secondary | ICD-10-CM | POA: Diagnosis present

## 2022-11-19 DIAGNOSIS — E119 Type 2 diabetes mellitus without complications: Secondary | ICD-10-CM | POA: Diagnosis present

## 2022-11-19 DIAGNOSIS — Z7985 Long-term (current) use of injectable non-insulin antidiabetic drugs: Secondary | ICD-10-CM | POA: Diagnosis not present

## 2022-11-19 DIAGNOSIS — Z96653 Presence of artificial knee joint, bilateral: Secondary | ICD-10-CM | POA: Diagnosis present

## 2022-11-19 DIAGNOSIS — M50822 Other cervical disc disorders at C5-C6 level: Secondary | ICD-10-CM | POA: Diagnosis not present

## 2022-11-19 DIAGNOSIS — G4730 Sleep apnea, unspecified: Secondary | ICD-10-CM | POA: Diagnosis not present

## 2022-11-19 DIAGNOSIS — M5081 Other cervical disc disorders,  high cervical region: Secondary | ICD-10-CM | POA: Diagnosis not present

## 2022-11-19 DIAGNOSIS — Z791 Long term (current) use of non-steroidal anti-inflammatories (NSAID): Secondary | ICD-10-CM | POA: Diagnosis not present

## 2022-11-19 DIAGNOSIS — I2510 Atherosclerotic heart disease of native coronary artery without angina pectoris: Secondary | ICD-10-CM | POA: Diagnosis present

## 2022-11-19 DIAGNOSIS — Z7901 Long term (current) use of anticoagulants: Secondary | ICD-10-CM | POA: Diagnosis not present

## 2022-11-19 DIAGNOSIS — Z981 Arthrodesis status: Secondary | ICD-10-CM | POA: Diagnosis not present

## 2022-11-19 DIAGNOSIS — E785 Hyperlipidemia, unspecified: Secondary | ICD-10-CM | POA: Diagnosis present

## 2022-11-19 DIAGNOSIS — I509 Heart failure, unspecified: Secondary | ICD-10-CM | POA: Diagnosis present

## 2022-11-19 DIAGNOSIS — I252 Old myocardial infarction: Secondary | ICD-10-CM | POA: Diagnosis not present

## 2022-11-19 DIAGNOSIS — Z96643 Presence of artificial hip joint, bilateral: Secondary | ICD-10-CM | POA: Diagnosis present

## 2022-11-19 DIAGNOSIS — Z79899 Other long term (current) drug therapy: Secondary | ICD-10-CM | POA: Diagnosis not present

## 2022-11-19 DIAGNOSIS — Z888 Allergy status to other drugs, medicaments and biological substances status: Secondary | ICD-10-CM | POA: Diagnosis not present

## 2022-11-19 DIAGNOSIS — I110 Hypertensive heart disease with heart failure: Secondary | ICD-10-CM | POA: Diagnosis present

## 2022-11-19 DIAGNOSIS — Z7984 Long term (current) use of oral hypoglycemic drugs: Secondary | ICD-10-CM | POA: Diagnosis not present

## 2022-11-19 DIAGNOSIS — E669 Obesity, unspecified: Secondary | ICD-10-CM | POA: Diagnosis present

## 2022-11-19 DIAGNOSIS — Z6837 Body mass index (BMI) 37.0-37.9, adult: Secondary | ICD-10-CM | POA: Diagnosis not present

## 2022-11-19 DIAGNOSIS — G4733 Obstructive sleep apnea (adult) (pediatric): Secondary | ICD-10-CM | POA: Diagnosis present

## 2022-11-19 DIAGNOSIS — M5001 Cervical disc disorder with myelopathy,  high cervical region: Secondary | ICD-10-CM | POA: Diagnosis present

## 2022-11-19 DIAGNOSIS — M159 Polyosteoarthritis, unspecified: Secondary | ICD-10-CM | POA: Diagnosis present

## 2022-11-19 DIAGNOSIS — Z951 Presence of aortocoronary bypass graft: Secondary | ICD-10-CM | POA: Diagnosis not present

## 2022-11-23 DIAGNOSIS — E1165 Type 2 diabetes mellitus with hyperglycemia: Secondary | ICD-10-CM | POA: Diagnosis not present

## 2022-11-23 DIAGNOSIS — G992 Myelopathy in diseases classified elsewhere: Secondary | ICD-10-CM | POA: Diagnosis not present

## 2022-11-23 DIAGNOSIS — E871 Hypo-osmolality and hyponatremia: Secondary | ICD-10-CM | POA: Diagnosis not present

## 2022-11-23 DIAGNOSIS — E119 Type 2 diabetes mellitus without complications: Secondary | ICD-10-CM | POA: Diagnosis not present

## 2022-11-23 DIAGNOSIS — I2510 Atherosclerotic heart disease of native coronary artery without angina pectoris: Secondary | ICD-10-CM | POA: Diagnosis not present

## 2022-11-23 DIAGNOSIS — M4802 Spinal stenosis, cervical region: Secondary | ICD-10-CM | POA: Diagnosis not present

## 2022-11-23 DIAGNOSIS — M62838 Other muscle spasm: Secondary | ICD-10-CM | POA: Diagnosis not present

## 2022-11-23 DIAGNOSIS — I10 Essential (primary) hypertension: Secondary | ICD-10-CM | POA: Diagnosis not present

## 2022-11-23 DIAGNOSIS — Z6836 Body mass index (BMI) 36.0-36.9, adult: Secondary | ICD-10-CM | POA: Diagnosis not present

## 2022-11-23 DIAGNOSIS — I252 Old myocardial infarction: Secondary | ICD-10-CM | POA: Diagnosis not present

## 2022-11-23 DIAGNOSIS — I5040 Unspecified combined systolic (congestive) and diastolic (congestive) heart failure: Secondary | ICD-10-CM | POA: Diagnosis not present

## 2022-11-23 DIAGNOSIS — M6281 Muscle weakness (generalized): Secondary | ICD-10-CM | POA: Diagnosis not present

## 2022-11-23 DIAGNOSIS — Z7901 Long term (current) use of anticoagulants: Secondary | ICD-10-CM | POA: Diagnosis not present

## 2022-11-23 DIAGNOSIS — D72829 Elevated white blood cell count, unspecified: Secondary | ICD-10-CM | POA: Diagnosis not present

## 2022-11-23 DIAGNOSIS — Z466 Encounter for fitting and adjustment of urinary device: Secondary | ICD-10-CM | POA: Diagnosis not present

## 2022-11-23 DIAGNOSIS — R252 Cramp and spasm: Secondary | ICD-10-CM | POA: Diagnosis not present

## 2022-11-23 DIAGNOSIS — R260 Ataxic gait: Secondary | ICD-10-CM | POA: Diagnosis not present

## 2022-11-23 DIAGNOSIS — R69 Illness, unspecified: Secondary | ICD-10-CM | POA: Diagnosis not present

## 2022-11-23 DIAGNOSIS — Z951 Presence of aortocoronary bypass graft: Secondary | ICD-10-CM | POA: Diagnosis not present

## 2022-11-23 DIAGNOSIS — R2689 Other abnormalities of gait and mobility: Secondary | ICD-10-CM | POA: Diagnosis not present

## 2022-11-23 DIAGNOSIS — N3001 Acute cystitis with hematuria: Secondary | ICD-10-CM | POA: Diagnosis not present

## 2022-11-23 DIAGNOSIS — Z743 Need for continuous supervision: Secondary | ICD-10-CM | POA: Diagnosis not present

## 2022-11-23 DIAGNOSIS — I5020 Unspecified systolic (congestive) heart failure: Secondary | ICD-10-CM | POA: Diagnosis not present

## 2022-11-23 DIAGNOSIS — M159 Polyosteoarthritis, unspecified: Secondary | ICD-10-CM | POA: Diagnosis not present

## 2022-11-23 DIAGNOSIS — M5000 Cervical disc disorder with myelopathy, unspecified cervical region: Secondary | ICD-10-CM | POA: Diagnosis not present

## 2022-11-23 DIAGNOSIS — I5042 Chronic combined systolic (congestive) and diastolic (congestive) heart failure: Secondary | ICD-10-CM | POA: Diagnosis not present

## 2022-11-23 DIAGNOSIS — I480 Paroxysmal atrial fibrillation: Secondary | ICD-10-CM | POA: Diagnosis not present

## 2022-11-23 DIAGNOSIS — Z4789 Encounter for other orthopedic aftercare: Secondary | ICD-10-CM | POA: Diagnosis not present

## 2022-11-23 DIAGNOSIS — E1169 Type 2 diabetes mellitus with other specified complication: Secondary | ICD-10-CM | POA: Diagnosis not present

## 2022-11-23 DIAGNOSIS — G959 Disease of spinal cord, unspecified: Secondary | ICD-10-CM | POA: Diagnosis not present

## 2022-11-23 DIAGNOSIS — N3090 Cystitis, unspecified without hematuria: Secondary | ICD-10-CM | POA: Diagnosis not present

## 2022-11-23 DIAGNOSIS — I110 Hypertensive heart disease with heart failure: Secondary | ICD-10-CM | POA: Diagnosis not present

## 2022-11-23 DIAGNOSIS — Z981 Arthrodesis status: Secondary | ICD-10-CM | POA: Diagnosis not present

## 2022-11-23 DIAGNOSIS — E785 Hyperlipidemia, unspecified: Secondary | ICD-10-CM | POA: Diagnosis not present

## 2022-11-23 DIAGNOSIS — K5900 Constipation, unspecified: Secondary | ICD-10-CM | POA: Diagnosis not present

## 2022-11-23 DIAGNOSIS — G4733 Obstructive sleep apnea (adult) (pediatric): Secondary | ICD-10-CM | POA: Diagnosis not present

## 2022-11-23 DIAGNOSIS — Z7984 Long term (current) use of oral hypoglycemic drugs: Secondary | ICD-10-CM | POA: Diagnosis not present

## 2022-11-23 DIAGNOSIS — M5412 Radiculopathy, cervical region: Secondary | ICD-10-CM | POA: Diagnosis not present

## 2022-11-23 DIAGNOSIS — Z978 Presence of other specified devices: Secondary | ICD-10-CM | POA: Diagnosis not present

## 2022-11-23 DIAGNOSIS — R339 Retention of urine, unspecified: Secondary | ICD-10-CM | POA: Diagnosis not present

## 2022-11-23 DIAGNOSIS — M5001 Cervical disc disorder with myelopathy,  high cervical region: Secondary | ICD-10-CM | POA: Diagnosis not present

## 2022-11-23 DIAGNOSIS — Z794 Long term (current) use of insulin: Secondary | ICD-10-CM | POA: Diagnosis not present

## 2022-12-02 ENCOUNTER — Other Ambulatory Visit (HOSPITAL_COMMUNITY): Payer: Self-pay

## 2022-12-15 ENCOUNTER — Other Ambulatory Visit (HOSPITAL_COMMUNITY): Payer: Self-pay

## 2022-12-15 ENCOUNTER — Other Ambulatory Visit: Payer: Self-pay

## 2022-12-15 MED ORDER — CELECOXIB 200 MG PO CAPS
200.0000 mg | ORAL_CAPSULE | Freq: Every morning | ORAL | 0 refills | Status: DC
  Filled 2022-12-15: qty 30, 30d supply, fill #0

## 2022-12-15 MED ORDER — CLOPIDOGREL BISULFATE 75 MG PO TABS
75.0000 mg | ORAL_TABLET | Freq: Every morning | ORAL | 0 refills | Status: DC
  Filled 2022-12-15 – 2023-02-21 (×2): qty 30, 30d supply, fill #0

## 2022-12-15 MED ORDER — CYCLOBENZAPRINE HCL 10 MG PO TABS
10.0000 mg | ORAL_TABLET | Freq: Four times a day (QID) | ORAL | 0 refills | Status: DC | PRN
  Filled 2022-12-15: qty 120, 30d supply, fill #0

## 2022-12-15 MED ORDER — METFORMIN HCL 1000 MG PO TABS
1000.0000 mg | ORAL_TABLET | Freq: Two times a day (BID) | ORAL | 0 refills | Status: DC
  Filled 2022-12-15: qty 60, 30d supply, fill #0

## 2022-12-15 MED ORDER — METFORMIN HCL ER 500 MG PO TB24
1000.0000 mg | ORAL_TABLET | Freq: Two times a day (BID) | ORAL | 0 refills | Status: DC
  Filled 2023-03-21 – 2023-03-22 (×3): qty 120, 30d supply, fill #0

## 2022-12-15 MED ORDER — MOUNJARO 7.5 MG/0.5ML ~~LOC~~ SOAJ
7.5000 mg | SUBCUTANEOUS | 0 refills | Status: DC
  Filled 2022-12-15: qty 2, 28d supply, fill #0

## 2022-12-15 MED ORDER — OXYCODONE HCL 5 MG PO TABS
10.0000 mg | ORAL_TABLET | ORAL | 0 refills | Status: DC | PRN
  Filled 2022-12-15: qty 12, 2d supply, fill #0

## 2022-12-15 MED ORDER — DAPAGLIFLOZIN PROPANEDIOL 10 MG PO TABS
10.0000 mg | ORAL_TABLET | Freq: Every morning | ORAL | 0 refills | Status: DC
  Filled 2022-12-15: qty 30, 30d supply, fill #0

## 2022-12-15 MED ORDER — CELECOXIB 100 MG PO CAPS
200.0000 mg | ORAL_CAPSULE | Freq: Two times a day (BID) | ORAL | 0 refills | Status: DC
  Filled 2022-12-15: qty 120, 30d supply, fill #0

## 2022-12-15 MED ORDER — DICLOFENAC SODIUM 1 % EX GEL
4.0000 g | Freq: Three times a day (TID) | CUTANEOUS | 0 refills | Status: AC
  Filled 2022-12-15: qty 100, 8d supply, fill #0

## 2022-12-15 MED ORDER — ATORVASTATIN CALCIUM 40 MG PO TABS
40.0000 mg | ORAL_TABLET | Freq: Every day | ORAL | 0 refills | Status: DC
  Filled 2022-12-15: qty 30, 30d supply, fill #0

## 2022-12-15 MED ORDER — CARVEDILOL 25 MG PO TABS
25.0000 mg | ORAL_TABLET | Freq: Two times a day (BID) | ORAL | 0 refills | Status: DC
  Filled 2022-12-15: qty 60, 30d supply, fill #0

## 2022-12-16 ENCOUNTER — Other Ambulatory Visit (HOSPITAL_COMMUNITY): Payer: Self-pay

## 2022-12-16 ENCOUNTER — Other Ambulatory Visit: Payer: Self-pay

## 2022-12-16 DIAGNOSIS — N3941 Urge incontinence: Secondary | ICD-10-CM | POA: Diagnosis not present

## 2022-12-16 DIAGNOSIS — M79675 Pain in left toe(s): Secondary | ICD-10-CM | POA: Diagnosis not present

## 2022-12-16 DIAGNOSIS — R351 Nocturia: Secondary | ICD-10-CM | POA: Diagnosis not present

## 2022-12-16 DIAGNOSIS — Z7984 Long term (current) use of oral hypoglycemic drugs: Secondary | ICD-10-CM | POA: Diagnosis not present

## 2022-12-16 DIAGNOSIS — Z7985 Long-term (current) use of injectable non-insulin antidiabetic drugs: Secondary | ICD-10-CM | POA: Diagnosis not present

## 2022-12-16 DIAGNOSIS — K5900 Constipation, unspecified: Secondary | ICD-10-CM | POA: Diagnosis not present

## 2022-12-16 DIAGNOSIS — Z9889 Other specified postprocedural states: Secondary | ICD-10-CM | POA: Diagnosis not present

## 2022-12-16 DIAGNOSIS — I5040 Unspecified combined systolic (congestive) and diastolic (congestive) heart failure: Secondary | ICD-10-CM | POA: Diagnosis not present

## 2022-12-16 DIAGNOSIS — N3090 Cystitis, unspecified without hematuria: Secondary | ICD-10-CM | POA: Diagnosis not present

## 2022-12-16 DIAGNOSIS — G4733 Obstructive sleep apnea (adult) (pediatric): Secondary | ICD-10-CM | POA: Diagnosis not present

## 2022-12-16 DIAGNOSIS — I479 Paroxysmal tachycardia, unspecified: Secondary | ICD-10-CM | POA: Diagnosis not present

## 2022-12-16 DIAGNOSIS — Z4789 Encounter for other orthopedic aftercare: Secondary | ICD-10-CM | POA: Diagnosis not present

## 2022-12-16 DIAGNOSIS — N401 Enlarged prostate with lower urinary tract symptoms: Secondary | ICD-10-CM | POA: Diagnosis not present

## 2022-12-16 DIAGNOSIS — M62838 Other muscle spasm: Secondary | ICD-10-CM | POA: Diagnosis not present

## 2022-12-16 DIAGNOSIS — G959 Disease of spinal cord, unspecified: Secondary | ICD-10-CM | POA: Diagnosis not present

## 2022-12-16 DIAGNOSIS — E871 Hypo-osmolality and hyponatremia: Secondary | ICD-10-CM | POA: Diagnosis not present

## 2022-12-16 DIAGNOSIS — E785 Hyperlipidemia, unspecified: Secondary | ICD-10-CM | POA: Diagnosis not present

## 2022-12-16 DIAGNOSIS — I152 Hypertension secondary to endocrine disorders: Secondary | ICD-10-CM | POA: Diagnosis not present

## 2022-12-16 DIAGNOSIS — E1165 Type 2 diabetes mellitus with hyperglycemia: Secondary | ICD-10-CM | POA: Diagnosis not present

## 2022-12-16 DIAGNOSIS — I110 Hypertensive heart disease with heart failure: Secondary | ICD-10-CM | POA: Diagnosis not present

## 2022-12-16 DIAGNOSIS — B351 Tinea unguium: Secondary | ICD-10-CM | POA: Diagnosis not present

## 2022-12-16 DIAGNOSIS — I10 Essential (primary) hypertension: Secondary | ICD-10-CM | POA: Diagnosis not present

## 2022-12-16 DIAGNOSIS — E119 Type 2 diabetes mellitus without complications: Secondary | ICD-10-CM | POA: Diagnosis not present

## 2022-12-16 DIAGNOSIS — M4802 Spinal stenosis, cervical region: Secondary | ICD-10-CM | POA: Diagnosis not present

## 2022-12-16 DIAGNOSIS — Z7901 Long term (current) use of anticoagulants: Secondary | ICD-10-CM | POA: Diagnosis not present

## 2022-12-16 DIAGNOSIS — E1159 Type 2 diabetes mellitus with other circulatory complications: Secondary | ICD-10-CM | POA: Diagnosis not present

## 2022-12-16 DIAGNOSIS — M5001 Cervical disc disorder with myelopathy,  high cervical region: Secondary | ICD-10-CM | POA: Diagnosis not present

## 2022-12-16 DIAGNOSIS — I2510 Atherosclerotic heart disease of native coronary artery without angina pectoris: Secondary | ICD-10-CM | POA: Diagnosis not present

## 2022-12-16 DIAGNOSIS — R2990 Unspecified symptoms and signs involving the nervous system: Secondary | ICD-10-CM | POA: Diagnosis not present

## 2022-12-16 DIAGNOSIS — Z978 Presence of other specified devices: Secondary | ICD-10-CM | POA: Diagnosis not present

## 2022-12-16 DIAGNOSIS — Z7409 Other reduced mobility: Secondary | ICD-10-CM | POA: Diagnosis not present

## 2022-12-16 DIAGNOSIS — I5020 Unspecified systolic (congestive) heart failure: Secondary | ICD-10-CM | POA: Diagnosis not present

## 2022-12-16 DIAGNOSIS — M79674 Pain in right toe(s): Secondary | ICD-10-CM | POA: Diagnosis not present

## 2022-12-16 DIAGNOSIS — Z981 Arthrodesis status: Secondary | ICD-10-CM | POA: Diagnosis not present

## 2022-12-17 ENCOUNTER — Other Ambulatory Visit (HOSPITAL_COMMUNITY): Payer: Self-pay

## 2022-12-22 DIAGNOSIS — E1159 Type 2 diabetes mellitus with other circulatory complications: Secondary | ICD-10-CM | POA: Diagnosis not present

## 2022-12-22 DIAGNOSIS — M4802 Spinal stenosis, cervical region: Secondary | ICD-10-CM | POA: Diagnosis not present

## 2022-12-22 DIAGNOSIS — Z7985 Long-term (current) use of injectable non-insulin antidiabetic drugs: Secondary | ICD-10-CM | POA: Diagnosis not present

## 2022-12-22 DIAGNOSIS — G959 Disease of spinal cord, unspecified: Secondary | ICD-10-CM | POA: Diagnosis not present

## 2022-12-22 DIAGNOSIS — R2990 Unspecified symptoms and signs involving the nervous system: Secondary | ICD-10-CM | POA: Diagnosis not present

## 2022-12-22 DIAGNOSIS — I5040 Unspecified combined systolic (congestive) and diastolic (congestive) heart failure: Secondary | ICD-10-CM | POA: Diagnosis not present

## 2022-12-22 DIAGNOSIS — Z7984 Long term (current) use of oral hypoglycemic drugs: Secondary | ICD-10-CM | POA: Diagnosis not present

## 2022-12-22 DIAGNOSIS — I152 Hypertension secondary to endocrine disorders: Secondary | ICD-10-CM | POA: Diagnosis not present

## 2022-12-22 DIAGNOSIS — G4733 Obstructive sleep apnea (adult) (pediatric): Secondary | ICD-10-CM | POA: Diagnosis not present

## 2022-12-22 DIAGNOSIS — Z9889 Other specified postprocedural states: Secondary | ICD-10-CM | POA: Diagnosis not present

## 2022-12-22 DIAGNOSIS — I479 Paroxysmal tachycardia, unspecified: Secondary | ICD-10-CM | POA: Diagnosis not present

## 2022-12-22 DIAGNOSIS — I2510 Atherosclerotic heart disease of native coronary artery without angina pectoris: Secondary | ICD-10-CM | POA: Diagnosis not present

## 2023-01-07 ENCOUNTER — Ambulatory Visit: Payer: Medicare Other | Admitting: Podiatry

## 2023-01-07 DIAGNOSIS — N401 Enlarged prostate with lower urinary tract symptoms: Secondary | ICD-10-CM | POA: Diagnosis not present

## 2023-01-07 DIAGNOSIS — R351 Nocturia: Secondary | ICD-10-CM | POA: Diagnosis not present

## 2023-01-15 ENCOUNTER — Other Ambulatory Visit (HOSPITAL_COMMUNITY): Payer: Self-pay

## 2023-01-15 ENCOUNTER — Other Ambulatory Visit: Payer: Self-pay | Admitting: Interventional Cardiology

## 2023-01-17 ENCOUNTER — Other Ambulatory Visit: Payer: Self-pay

## 2023-01-17 ENCOUNTER — Other Ambulatory Visit (HOSPITAL_COMMUNITY): Payer: Self-pay

## 2023-01-17 MED ORDER — TRIAMTERENE-HCTZ 37.5-25 MG PO CAPS
1.0000 | ORAL_CAPSULE | Freq: Every morning | ORAL | 0 refills | Status: DC
  Filled 2023-01-17: qty 90, 90d supply, fill #0

## 2023-01-17 MED ORDER — AMLODIPINE BESYLATE 5 MG PO TABS
5.0000 mg | ORAL_TABLET | Freq: Every day | ORAL | 0 refills | Status: DC
  Filled 2023-01-17: qty 90, 90d supply, fill #0

## 2023-01-18 ENCOUNTER — Other Ambulatory Visit: Payer: Self-pay

## 2023-01-19 ENCOUNTER — Encounter: Payer: Self-pay | Admitting: Podiatry

## 2023-01-19 ENCOUNTER — Ambulatory Visit: Payer: Medicare Other | Admitting: Podiatry

## 2023-01-19 DIAGNOSIS — B351 Tinea unguium: Secondary | ICD-10-CM | POA: Diagnosis not present

## 2023-01-19 DIAGNOSIS — M79675 Pain in left toe(s): Secondary | ICD-10-CM | POA: Diagnosis not present

## 2023-01-19 DIAGNOSIS — M79674 Pain in right toe(s): Secondary | ICD-10-CM

## 2023-01-19 NOTE — Progress Notes (Signed)
Subjective:   Patient ID: Ryan Bad, MD, male   DOB: 72 y.o.   MRN: [id number]   HPI Patient had some neck surgery is now in wheelchair and has thick brittle nailbeds 1-5 both feet that are painful and he cannot cut   ROS      Objective:  Physical Exam  Neurovascular status intact mycotic nail infection 1-5 both feet with pain     Assessment:  Chronic mycotic nail infection with pain 1-5 both feet     Plan:  Debridement of nailbeds 1-5 both feet no iatrogenic bleeding

## 2023-01-20 ENCOUNTER — Other Ambulatory Visit: Payer: Self-pay

## 2023-01-20 DIAGNOSIS — N3941 Urge incontinence: Secondary | ICD-10-CM | POA: Diagnosis not present

## 2023-01-20 DIAGNOSIS — N401 Enlarged prostate with lower urinary tract symptoms: Secondary | ICD-10-CM | POA: Diagnosis not present

## 2023-01-21 ENCOUNTER — Other Ambulatory Visit: Payer: Self-pay

## 2023-01-21 ENCOUNTER — Other Ambulatory Visit (HOSPITAL_COMMUNITY): Payer: Self-pay

## 2023-01-21 ENCOUNTER — Encounter: Payer: Self-pay | Admitting: Pharmacist

## 2023-01-21 DIAGNOSIS — Z7409 Other reduced mobility: Secondary | ICD-10-CM | POA: Diagnosis not present

## 2023-01-21 MED ORDER — GEMTESA 75 MG PO TABS
75.0000 mg | ORAL_TABLET | Freq: Every day | ORAL | 5 refills | Status: DC
  Filled 2023-01-21 – 2023-04-14 (×6): qty 30, 30d supply, fill #0

## 2023-01-23 DIAGNOSIS — I479 Paroxysmal tachycardia, unspecified: Secondary | ICD-10-CM | POA: Diagnosis not present

## 2023-01-23 DIAGNOSIS — I5042 Chronic combined systolic (congestive) and diastolic (congestive) heart failure: Secondary | ICD-10-CM | POA: Diagnosis not present

## 2023-01-23 DIAGNOSIS — I252 Old myocardial infarction: Secondary | ICD-10-CM | POA: Diagnosis not present

## 2023-01-23 DIAGNOSIS — E785 Hyperlipidemia, unspecified: Secondary | ICD-10-CM | POA: Diagnosis not present

## 2023-01-23 DIAGNOSIS — Z7985 Long-term (current) use of injectable non-insulin antidiabetic drugs: Secondary | ICD-10-CM | POA: Diagnosis not present

## 2023-01-23 DIAGNOSIS — H547 Unspecified visual loss: Secondary | ICD-10-CM | POA: Diagnosis not present

## 2023-01-23 DIAGNOSIS — T8189XD Other complications of procedures, not elsewhere classified, subsequent encounter: Secondary | ICD-10-CM | POA: Diagnosis not present

## 2023-01-23 DIAGNOSIS — E1159 Type 2 diabetes mellitus with other circulatory complications: Secondary | ICD-10-CM | POA: Diagnosis not present

## 2023-01-23 DIAGNOSIS — R350 Frequency of micturition: Secondary | ICD-10-CM | POA: Diagnosis not present

## 2023-01-23 DIAGNOSIS — Z791 Long term (current) use of non-steroidal anti-inflammatories (NSAID): Secondary | ICD-10-CM | POA: Diagnosis not present

## 2023-01-23 DIAGNOSIS — Z6834 Body mass index (BMI) 34.0-34.9, adult: Secondary | ICD-10-CM | POA: Diagnosis not present

## 2023-01-23 DIAGNOSIS — I2510 Atherosclerotic heart disease of native coronary artery without angina pectoris: Secondary | ICD-10-CM | POA: Diagnosis not present

## 2023-01-23 DIAGNOSIS — M130 Polyarthritis, unspecified: Secondary | ICD-10-CM | POA: Diagnosis not present

## 2023-01-23 DIAGNOSIS — R351 Nocturia: Secondary | ICD-10-CM | POA: Diagnosis not present

## 2023-01-23 DIAGNOSIS — Z981 Arthrodesis status: Secondary | ICD-10-CM | POA: Diagnosis not present

## 2023-01-23 DIAGNOSIS — D759 Disease of blood and blood-forming organs, unspecified: Secondary | ICD-10-CM | POA: Diagnosis not present

## 2023-01-23 DIAGNOSIS — D696 Thrombocytopenia, unspecified: Secondary | ICD-10-CM | POA: Diagnosis not present

## 2023-01-23 DIAGNOSIS — N401 Enlarged prostate with lower urinary tract symptoms: Secondary | ICD-10-CM | POA: Diagnosis not present

## 2023-01-23 DIAGNOSIS — M4802 Spinal stenosis, cervical region: Secondary | ICD-10-CM | POA: Diagnosis not present

## 2023-01-23 DIAGNOSIS — I152 Hypertension secondary to endocrine disorders: Secondary | ICD-10-CM | POA: Diagnosis not present

## 2023-01-23 DIAGNOSIS — G4733 Obstructive sleep apnea (adult) (pediatric): Secondary | ICD-10-CM | POA: Diagnosis not present

## 2023-01-23 DIAGNOSIS — Z7902 Long term (current) use of antithrombotics/antiplatelets: Secondary | ICD-10-CM | POA: Diagnosis not present

## 2023-01-23 DIAGNOSIS — G959 Disease of spinal cord, unspecified: Secondary | ICD-10-CM | POA: Diagnosis not present

## 2023-01-23 DIAGNOSIS — Z7984 Long term (current) use of oral hypoglycemic drugs: Secondary | ICD-10-CM | POA: Diagnosis not present

## 2023-01-24 DIAGNOSIS — E1159 Type 2 diabetes mellitus with other circulatory complications: Secondary | ICD-10-CM | POA: Diagnosis not present

## 2023-01-24 DIAGNOSIS — I252 Old myocardial infarction: Secondary | ICD-10-CM | POA: Diagnosis not present

## 2023-01-24 DIAGNOSIS — G959 Disease of spinal cord, unspecified: Secondary | ICD-10-CM | POA: Diagnosis not present

## 2023-01-24 DIAGNOSIS — T8189XD Other complications of procedures, not elsewhere classified, subsequent encounter: Secondary | ICD-10-CM | POA: Diagnosis not present

## 2023-01-24 DIAGNOSIS — M4802 Spinal stenosis, cervical region: Secondary | ICD-10-CM | POA: Diagnosis not present

## 2023-01-24 DIAGNOSIS — I2510 Atherosclerotic heart disease of native coronary artery without angina pectoris: Secondary | ICD-10-CM | POA: Diagnosis not present

## 2023-01-25 ENCOUNTER — Other Ambulatory Visit: Payer: Self-pay | Admitting: Interventional Cardiology

## 2023-01-25 ENCOUNTER — Other Ambulatory Visit (HOSPITAL_COMMUNITY): Payer: Self-pay

## 2023-01-25 DIAGNOSIS — I2510 Atherosclerotic heart disease of native coronary artery without angina pectoris: Secondary | ICD-10-CM | POA: Diagnosis not present

## 2023-01-25 DIAGNOSIS — E1159 Type 2 diabetes mellitus with other circulatory complications: Secondary | ICD-10-CM | POA: Diagnosis not present

## 2023-01-25 DIAGNOSIS — G959 Disease of spinal cord, unspecified: Secondary | ICD-10-CM | POA: Diagnosis not present

## 2023-01-25 DIAGNOSIS — M4802 Spinal stenosis, cervical region: Secondary | ICD-10-CM | POA: Diagnosis not present

## 2023-01-25 DIAGNOSIS — T8189XD Other complications of procedures, not elsewhere classified, subsequent encounter: Secondary | ICD-10-CM | POA: Diagnosis not present

## 2023-01-25 DIAGNOSIS — I252 Old myocardial infarction: Secondary | ICD-10-CM | POA: Diagnosis not present

## 2023-01-26 ENCOUNTER — Other Ambulatory Visit: Payer: Self-pay

## 2023-01-26 ENCOUNTER — Other Ambulatory Visit (HOSPITAL_COMMUNITY): Payer: Self-pay

## 2023-01-26 DIAGNOSIS — R8271 Bacteriuria: Secondary | ICD-10-CM | POA: Diagnosis not present

## 2023-01-26 MED ORDER — CELECOXIB 200 MG PO CAPS
200.0000 mg | ORAL_CAPSULE | Freq: Two times a day (BID) | ORAL | 0 refills | Status: AC
Start: 2023-01-26 — End: ?
  Filled 2023-01-26: qty 30, 15d supply, fill #0

## 2023-01-26 MED ORDER — CEFUROXIME AXETIL 500 MG PO TABS
500.0000 mg | ORAL_TABLET | Freq: Two times a day (BID) | ORAL | 0 refills | Status: DC
  Filled 2023-01-26: qty 14, 7d supply, fill #0

## 2023-01-26 MED ORDER — OXYCODONE HCL 5 MG PO TABS
10.0000 mg | ORAL_TABLET | ORAL | 0 refills | Status: DC | PRN
  Filled 2023-01-26: qty 36, 6d supply, fill #0

## 2023-01-27 ENCOUNTER — Other Ambulatory Visit (HOSPITAL_COMMUNITY): Payer: Self-pay

## 2023-01-28 DIAGNOSIS — I252 Old myocardial infarction: Secondary | ICD-10-CM | POA: Diagnosis not present

## 2023-01-28 DIAGNOSIS — I2510 Atherosclerotic heart disease of native coronary artery without angina pectoris: Secondary | ICD-10-CM | POA: Diagnosis not present

## 2023-01-28 DIAGNOSIS — T8189XD Other complications of procedures, not elsewhere classified, subsequent encounter: Secondary | ICD-10-CM | POA: Diagnosis not present

## 2023-01-28 DIAGNOSIS — M4802 Spinal stenosis, cervical region: Secondary | ICD-10-CM | POA: Diagnosis not present

## 2023-01-28 DIAGNOSIS — G959 Disease of spinal cord, unspecified: Secondary | ICD-10-CM | POA: Diagnosis not present

## 2023-01-28 DIAGNOSIS — E1159 Type 2 diabetes mellitus with other circulatory complications: Secondary | ICD-10-CM | POA: Diagnosis not present

## 2023-01-31 ENCOUNTER — Ambulatory Visit: Payer: Medicare Other | Admitting: Podiatry

## 2023-01-31 ENCOUNTER — Other Ambulatory Visit (HOSPITAL_COMMUNITY): Payer: Self-pay

## 2023-01-31 ENCOUNTER — Other Ambulatory Visit: Payer: Self-pay

## 2023-01-31 ENCOUNTER — Other Ambulatory Visit (HOSPITAL_BASED_OUTPATIENT_CLINIC_OR_DEPARTMENT_OTHER): Payer: Self-pay

## 2023-01-31 DIAGNOSIS — G959 Disease of spinal cord, unspecified: Secondary | ICD-10-CM | POA: Diagnosis not present

## 2023-01-31 DIAGNOSIS — M4802 Spinal stenosis, cervical region: Secondary | ICD-10-CM | POA: Diagnosis not present

## 2023-01-31 DIAGNOSIS — I2510 Atherosclerotic heart disease of native coronary artery without angina pectoris: Secondary | ICD-10-CM | POA: Diagnosis not present

## 2023-01-31 DIAGNOSIS — T8189XD Other complications of procedures, not elsewhere classified, subsequent encounter: Secondary | ICD-10-CM | POA: Diagnosis not present

## 2023-01-31 DIAGNOSIS — I252 Old myocardial infarction: Secondary | ICD-10-CM | POA: Diagnosis not present

## 2023-01-31 DIAGNOSIS — E1159 Type 2 diabetes mellitus with other circulatory complications: Secondary | ICD-10-CM | POA: Diagnosis not present

## 2023-02-01 DIAGNOSIS — I252 Old myocardial infarction: Secondary | ICD-10-CM | POA: Diagnosis not present

## 2023-02-01 DIAGNOSIS — M4802 Spinal stenosis, cervical region: Secondary | ICD-10-CM | POA: Diagnosis not present

## 2023-02-01 DIAGNOSIS — G959 Disease of spinal cord, unspecified: Secondary | ICD-10-CM | POA: Diagnosis not present

## 2023-02-01 DIAGNOSIS — I2510 Atherosclerotic heart disease of native coronary artery without angina pectoris: Secondary | ICD-10-CM | POA: Diagnosis not present

## 2023-02-01 DIAGNOSIS — T8189XD Other complications of procedures, not elsewhere classified, subsequent encounter: Secondary | ICD-10-CM | POA: Diagnosis not present

## 2023-02-01 DIAGNOSIS — E1159 Type 2 diabetes mellitus with other circulatory complications: Secondary | ICD-10-CM | POA: Diagnosis not present

## 2023-02-02 ENCOUNTER — Encounter: Payer: Self-pay | Admitting: Podiatry

## 2023-02-02 ENCOUNTER — Ambulatory Visit (INDEPENDENT_AMBULATORY_CARE_PROVIDER_SITE_OTHER): Payer: Medicare Other | Admitting: Podiatry

## 2023-02-02 DIAGNOSIS — L03032 Cellulitis of left toe: Secondary | ICD-10-CM | POA: Diagnosis not present

## 2023-02-02 NOTE — Progress Notes (Signed)
Subjective:   Patient ID: Ryan Bad, MD, male   DOB: 72 y.o.   MRN: [id number]   HPI Patient presents with ingrown toenail left big toe medial border with inflammation redness and low-grade drainage   ROS      Objective:  Physical Exam  Neurovascular status intact localized irritation of the left hallux medial border mostly in the distal portion of the nailbed slight redness no active drainage     Assessment:  Localized paronychia infection hallux left with swelling of feet secondary to immobilization     Plan:  H&P reviewed anesthetized 60 mg like Marcaine mixture sterile prep done and went ahead with sterile instrumentation remove the medial border clean the bed out flushed it and applied sterile dressing.  Instructed on soaks reappoint as symptoms indicate

## 2023-02-03 DIAGNOSIS — I479 Paroxysmal tachycardia, unspecified: Secondary | ICD-10-CM | POA: Diagnosis not present

## 2023-02-03 DIAGNOSIS — E1159 Type 2 diabetes mellitus with other circulatory complications: Secondary | ICD-10-CM | POA: Diagnosis not present

## 2023-02-03 DIAGNOSIS — I152 Hypertension secondary to endocrine disorders: Secondary | ICD-10-CM | POA: Diagnosis not present

## 2023-02-03 DIAGNOSIS — I2510 Atherosclerotic heart disease of native coronary artery without angina pectoris: Secondary | ICD-10-CM | POA: Diagnosis not present

## 2023-02-03 DIAGNOSIS — M130 Polyarthritis, unspecified: Secondary | ICD-10-CM | POA: Diagnosis not present

## 2023-02-03 DIAGNOSIS — D759 Disease of blood and blood-forming organs, unspecified: Secondary | ICD-10-CM | POA: Diagnosis not present

## 2023-02-03 DIAGNOSIS — G959 Disease of spinal cord, unspecified: Secondary | ICD-10-CM | POA: Diagnosis not present

## 2023-02-03 DIAGNOSIS — D696 Thrombocytopenia, unspecified: Secondary | ICD-10-CM | POA: Diagnosis not present

## 2023-02-03 DIAGNOSIS — M4802 Spinal stenosis, cervical region: Secondary | ICD-10-CM | POA: Diagnosis not present

## 2023-02-03 DIAGNOSIS — I5042 Chronic combined systolic (congestive) and diastolic (congestive) heart failure: Secondary | ICD-10-CM | POA: Diagnosis not present

## 2023-02-03 DIAGNOSIS — I252 Old myocardial infarction: Secondary | ICD-10-CM | POA: Diagnosis not present

## 2023-02-03 DIAGNOSIS — T8189XD Other complications of procedures, not elsewhere classified, subsequent encounter: Secondary | ICD-10-CM | POA: Diagnosis not present

## 2023-02-04 DIAGNOSIS — G959 Disease of spinal cord, unspecified: Secondary | ICD-10-CM | POA: Diagnosis not present

## 2023-02-04 DIAGNOSIS — I252 Old myocardial infarction: Secondary | ICD-10-CM | POA: Diagnosis not present

## 2023-02-04 DIAGNOSIS — M4802 Spinal stenosis, cervical region: Secondary | ICD-10-CM | POA: Diagnosis not present

## 2023-02-04 DIAGNOSIS — I2510 Atherosclerotic heart disease of native coronary artery without angina pectoris: Secondary | ICD-10-CM | POA: Diagnosis not present

## 2023-02-04 DIAGNOSIS — E1159 Type 2 diabetes mellitus with other circulatory complications: Secondary | ICD-10-CM | POA: Diagnosis not present

## 2023-02-04 DIAGNOSIS — T8189XD Other complications of procedures, not elsewhere classified, subsequent encounter: Secondary | ICD-10-CM | POA: Diagnosis not present

## 2023-02-07 ENCOUNTER — Other Ambulatory Visit (HOSPITAL_COMMUNITY): Payer: Self-pay

## 2023-02-07 DIAGNOSIS — I252 Old myocardial infarction: Secondary | ICD-10-CM | POA: Diagnosis not present

## 2023-02-07 DIAGNOSIS — T8189XD Other complications of procedures, not elsewhere classified, subsequent encounter: Secondary | ICD-10-CM | POA: Diagnosis not present

## 2023-02-07 DIAGNOSIS — E1159 Type 2 diabetes mellitus with other circulatory complications: Secondary | ICD-10-CM | POA: Diagnosis not present

## 2023-02-07 DIAGNOSIS — M4802 Spinal stenosis, cervical region: Secondary | ICD-10-CM | POA: Diagnosis not present

## 2023-02-07 DIAGNOSIS — I2510 Atherosclerotic heart disease of native coronary artery without angina pectoris: Secondary | ICD-10-CM | POA: Diagnosis not present

## 2023-02-07 DIAGNOSIS — G959 Disease of spinal cord, unspecified: Secondary | ICD-10-CM | POA: Diagnosis not present

## 2023-02-08 DIAGNOSIS — E1159 Type 2 diabetes mellitus with other circulatory complications: Secondary | ICD-10-CM | POA: Diagnosis not present

## 2023-02-08 DIAGNOSIS — I2510 Atherosclerotic heart disease of native coronary artery without angina pectoris: Secondary | ICD-10-CM | POA: Diagnosis not present

## 2023-02-08 DIAGNOSIS — T8189XD Other complications of procedures, not elsewhere classified, subsequent encounter: Secondary | ICD-10-CM | POA: Diagnosis not present

## 2023-02-08 DIAGNOSIS — G959 Disease of spinal cord, unspecified: Secondary | ICD-10-CM | POA: Diagnosis not present

## 2023-02-08 DIAGNOSIS — I252 Old myocardial infarction: Secondary | ICD-10-CM | POA: Diagnosis not present

## 2023-02-08 DIAGNOSIS — M4802 Spinal stenosis, cervical region: Secondary | ICD-10-CM | POA: Diagnosis not present

## 2023-02-09 ENCOUNTER — Other Ambulatory Visit (HOSPITAL_COMMUNITY): Payer: Self-pay

## 2023-02-09 ENCOUNTER — Other Ambulatory Visit: Payer: Self-pay

## 2023-02-11 DIAGNOSIS — E1159 Type 2 diabetes mellitus with other circulatory complications: Secondary | ICD-10-CM | POA: Diagnosis not present

## 2023-02-11 DIAGNOSIS — I252 Old myocardial infarction: Secondary | ICD-10-CM | POA: Diagnosis not present

## 2023-02-11 DIAGNOSIS — G959 Disease of spinal cord, unspecified: Secondary | ICD-10-CM | POA: Diagnosis not present

## 2023-02-11 DIAGNOSIS — M4802 Spinal stenosis, cervical region: Secondary | ICD-10-CM | POA: Diagnosis not present

## 2023-02-11 DIAGNOSIS — T8189XD Other complications of procedures, not elsewhere classified, subsequent encounter: Secondary | ICD-10-CM | POA: Diagnosis not present

## 2023-02-11 DIAGNOSIS — I2510 Atherosclerotic heart disease of native coronary artery without angina pectoris: Secondary | ICD-10-CM | POA: Diagnosis not present

## 2023-02-14 ENCOUNTER — Other Ambulatory Visit (HOSPITAL_COMMUNITY): Payer: Self-pay

## 2023-02-14 DIAGNOSIS — E1159 Type 2 diabetes mellitus with other circulatory complications: Secondary | ICD-10-CM | POA: Diagnosis not present

## 2023-02-14 DIAGNOSIS — T8189XD Other complications of procedures, not elsewhere classified, subsequent encounter: Secondary | ICD-10-CM | POA: Diagnosis not present

## 2023-02-14 DIAGNOSIS — I2510 Atherosclerotic heart disease of native coronary artery without angina pectoris: Secondary | ICD-10-CM | POA: Diagnosis not present

## 2023-02-14 DIAGNOSIS — G959 Disease of spinal cord, unspecified: Secondary | ICD-10-CM | POA: Diagnosis not present

## 2023-02-14 DIAGNOSIS — M4802 Spinal stenosis, cervical region: Secondary | ICD-10-CM | POA: Diagnosis not present

## 2023-02-14 DIAGNOSIS — I252 Old myocardial infarction: Secondary | ICD-10-CM | POA: Diagnosis not present

## 2023-02-15 DIAGNOSIS — I2510 Atherosclerotic heart disease of native coronary artery without angina pectoris: Secondary | ICD-10-CM | POA: Diagnosis not present

## 2023-02-15 DIAGNOSIS — M4802 Spinal stenosis, cervical region: Secondary | ICD-10-CM | POA: Diagnosis not present

## 2023-02-15 DIAGNOSIS — I252 Old myocardial infarction: Secondary | ICD-10-CM | POA: Diagnosis not present

## 2023-02-15 DIAGNOSIS — T8189XD Other complications of procedures, not elsewhere classified, subsequent encounter: Secondary | ICD-10-CM | POA: Diagnosis not present

## 2023-02-15 DIAGNOSIS — G959 Disease of spinal cord, unspecified: Secondary | ICD-10-CM | POA: Diagnosis not present

## 2023-02-15 DIAGNOSIS — E1159 Type 2 diabetes mellitus with other circulatory complications: Secondary | ICD-10-CM | POA: Diagnosis not present

## 2023-02-17 ENCOUNTER — Other Ambulatory Visit: Payer: Self-pay | Admitting: Interventional Cardiology

## 2023-02-17 ENCOUNTER — Other Ambulatory Visit: Payer: Self-pay

## 2023-02-17 DIAGNOSIS — I2510 Atherosclerotic heart disease of native coronary artery without angina pectoris: Secondary | ICD-10-CM | POA: Diagnosis not present

## 2023-02-17 DIAGNOSIS — M4802 Spinal stenosis, cervical region: Secondary | ICD-10-CM | POA: Diagnosis not present

## 2023-02-17 DIAGNOSIS — G959 Disease of spinal cord, unspecified: Secondary | ICD-10-CM | POA: Diagnosis not present

## 2023-02-17 DIAGNOSIS — T8189XD Other complications of procedures, not elsewhere classified, subsequent encounter: Secondary | ICD-10-CM | POA: Diagnosis not present

## 2023-02-17 DIAGNOSIS — E1159 Type 2 diabetes mellitus with other circulatory complications: Secondary | ICD-10-CM | POA: Diagnosis not present

## 2023-02-17 DIAGNOSIS — I252 Old myocardial infarction: Secondary | ICD-10-CM | POA: Diagnosis not present

## 2023-02-17 MED ORDER — TRIAMTERENE-HCTZ 37.5-25 MG PO CAPS
1.0000 | ORAL_CAPSULE | Freq: Every morning | ORAL | 0 refills | Status: DC
  Filled 2023-02-17: qty 90, 90d supply, fill #0
  Filled 2023-03-21 – 2023-04-14 (×2): qty 30, 30d supply, fill #0
  Filled 2023-05-30: qty 30, 30d supply, fill #1

## 2023-02-17 MED ORDER — AMLODIPINE BESYLATE 5 MG PO TABS
5.0000 mg | ORAL_TABLET | Freq: Every day | ORAL | 0 refills | Status: DC
  Filled 2023-02-17: qty 90, 90d supply, fill #0
  Filled 2023-03-21 – 2023-04-14 (×3): qty 30, 30d supply, fill #0

## 2023-02-18 ENCOUNTER — Other Ambulatory Visit (HOSPITAL_COMMUNITY): Payer: Self-pay

## 2023-02-18 DIAGNOSIS — I252 Old myocardial infarction: Secondary | ICD-10-CM | POA: Diagnosis not present

## 2023-02-18 DIAGNOSIS — G959 Disease of spinal cord, unspecified: Secondary | ICD-10-CM | POA: Diagnosis not present

## 2023-02-18 DIAGNOSIS — M4802 Spinal stenosis, cervical region: Secondary | ICD-10-CM | POA: Diagnosis not present

## 2023-02-18 DIAGNOSIS — E1159 Type 2 diabetes mellitus with other circulatory complications: Secondary | ICD-10-CM | POA: Diagnosis not present

## 2023-02-18 DIAGNOSIS — T8189XD Other complications of procedures, not elsewhere classified, subsequent encounter: Secondary | ICD-10-CM | POA: Diagnosis not present

## 2023-02-18 DIAGNOSIS — I2510 Atherosclerotic heart disease of native coronary artery without angina pectoris: Secondary | ICD-10-CM | POA: Diagnosis not present

## 2023-02-18 MED ORDER — ATORVASTATIN CALCIUM 40 MG PO TABS
40.0000 mg | ORAL_TABLET | Freq: Every day | ORAL | 0 refills | Status: DC
  Filled 2023-02-18 – 2023-02-21 (×2): qty 30, 30d supply, fill #0

## 2023-02-18 MED ORDER — CARVEDILOL 25 MG PO TABS
25.0000 mg | ORAL_TABLET | Freq: Two times a day (BID) | ORAL | 0 refills | Status: DC
  Filled 2023-02-18 – 2023-02-21 (×2): qty 60, 30d supply, fill #0

## 2023-02-18 NOTE — Telephone Encounter (Signed)
OK to refill.  Needs annual f/u scheduled

## 2023-02-19 ENCOUNTER — Other Ambulatory Visit (HOSPITAL_COMMUNITY): Payer: Self-pay

## 2023-02-21 ENCOUNTER — Other Ambulatory Visit: Payer: Self-pay

## 2023-02-21 ENCOUNTER — Other Ambulatory Visit (HOSPITAL_COMMUNITY): Payer: Self-pay

## 2023-02-21 ENCOUNTER — Other Ambulatory Visit: Payer: Self-pay | Admitting: Interventional Cardiology

## 2023-02-21 DIAGNOSIS — G959 Disease of spinal cord, unspecified: Secondary | ICD-10-CM | POA: Diagnosis not present

## 2023-02-21 DIAGNOSIS — T8189XD Other complications of procedures, not elsewhere classified, subsequent encounter: Secondary | ICD-10-CM | POA: Diagnosis not present

## 2023-02-21 DIAGNOSIS — I252 Old myocardial infarction: Secondary | ICD-10-CM | POA: Diagnosis not present

## 2023-02-21 DIAGNOSIS — I2510 Atherosclerotic heart disease of native coronary artery without angina pectoris: Secondary | ICD-10-CM | POA: Diagnosis not present

## 2023-02-21 DIAGNOSIS — E1159 Type 2 diabetes mellitus with other circulatory complications: Secondary | ICD-10-CM | POA: Diagnosis not present

## 2023-02-21 DIAGNOSIS — M4802 Spinal stenosis, cervical region: Secondary | ICD-10-CM | POA: Diagnosis not present

## 2023-02-22 ENCOUNTER — Other Ambulatory Visit: Payer: Self-pay | Admitting: Interventional Cardiology

## 2023-02-22 ENCOUNTER — Other Ambulatory Visit: Payer: Self-pay

## 2023-02-22 ENCOUNTER — Other Ambulatory Visit (HOSPITAL_COMMUNITY): Payer: Self-pay

## 2023-02-22 DIAGNOSIS — N401 Enlarged prostate with lower urinary tract symptoms: Secondary | ICD-10-CM | POA: Diagnosis not present

## 2023-02-22 DIAGNOSIS — D759 Disease of blood and blood-forming organs, unspecified: Secondary | ICD-10-CM | POA: Diagnosis not present

## 2023-02-22 DIAGNOSIS — E1159 Type 2 diabetes mellitus with other circulatory complications: Secondary | ICD-10-CM | POA: Diagnosis not present

## 2023-02-22 DIAGNOSIS — I2510 Atherosclerotic heart disease of native coronary artery without angina pectoris: Secondary | ICD-10-CM | POA: Diagnosis not present

## 2023-02-22 DIAGNOSIS — Z981 Arthrodesis status: Secondary | ICD-10-CM | POA: Diagnosis not present

## 2023-02-22 DIAGNOSIS — G4733 Obstructive sleep apnea (adult) (pediatric): Secondary | ICD-10-CM | POA: Diagnosis not present

## 2023-02-22 DIAGNOSIS — I152 Hypertension secondary to endocrine disorders: Secondary | ICD-10-CM | POA: Diagnosis not present

## 2023-02-22 DIAGNOSIS — I5042 Chronic combined systolic (congestive) and diastolic (congestive) heart failure: Secondary | ICD-10-CM | POA: Diagnosis not present

## 2023-02-22 DIAGNOSIS — D696 Thrombocytopenia, unspecified: Secondary | ICD-10-CM | POA: Diagnosis not present

## 2023-02-22 DIAGNOSIS — G959 Disease of spinal cord, unspecified: Secondary | ICD-10-CM | POA: Diagnosis not present

## 2023-02-22 DIAGNOSIS — M4802 Spinal stenosis, cervical region: Secondary | ICD-10-CM | POA: Diagnosis not present

## 2023-02-22 DIAGNOSIS — Z791 Long term (current) use of non-steroidal anti-inflammatories (NSAID): Secondary | ICD-10-CM | POA: Diagnosis not present

## 2023-02-22 DIAGNOSIS — I252 Old myocardial infarction: Secondary | ICD-10-CM | POA: Diagnosis not present

## 2023-02-22 DIAGNOSIS — T8189XD Other complications of procedures, not elsewhere classified, subsequent encounter: Secondary | ICD-10-CM | POA: Diagnosis not present

## 2023-02-22 DIAGNOSIS — Z7984 Long term (current) use of oral hypoglycemic drugs: Secondary | ICD-10-CM | POA: Diagnosis not present

## 2023-02-22 DIAGNOSIS — R350 Frequency of micturition: Secondary | ICD-10-CM | POA: Diagnosis not present

## 2023-02-22 DIAGNOSIS — E785 Hyperlipidemia, unspecified: Secondary | ICD-10-CM | POA: Diagnosis not present

## 2023-02-22 DIAGNOSIS — I479 Paroxysmal tachycardia, unspecified: Secondary | ICD-10-CM | POA: Diagnosis not present

## 2023-02-22 DIAGNOSIS — Z7902 Long term (current) use of antithrombotics/antiplatelets: Secondary | ICD-10-CM | POA: Diagnosis not present

## 2023-02-22 DIAGNOSIS — Z7985 Long-term (current) use of injectable non-insulin antidiabetic drugs: Secondary | ICD-10-CM | POA: Diagnosis not present

## 2023-02-22 DIAGNOSIS — Z6834 Body mass index (BMI) 34.0-34.9, adult: Secondary | ICD-10-CM | POA: Diagnosis not present

## 2023-02-22 DIAGNOSIS — H547 Unspecified visual loss: Secondary | ICD-10-CM | POA: Diagnosis not present

## 2023-02-22 DIAGNOSIS — M130 Polyarthritis, unspecified: Secondary | ICD-10-CM | POA: Diagnosis not present

## 2023-02-22 DIAGNOSIS — R351 Nocturia: Secondary | ICD-10-CM | POA: Diagnosis not present

## 2023-02-23 ENCOUNTER — Other Ambulatory Visit: Payer: Self-pay

## 2023-02-23 MED ORDER — EZETIMIBE 10 MG PO TABS
10.0000 mg | ORAL_TABLET | Freq: Every day | ORAL | 0 refills | Status: DC
  Filled 2023-02-23: qty 30, 30d supply, fill #0
  Filled 2023-03-21 – 2023-03-22 (×3): qty 30, 30d supply, fill #1
  Filled 2023-04-14: qty 30, 30d supply, fill #2

## 2023-02-24 ENCOUNTER — Other Ambulatory Visit: Payer: Self-pay

## 2023-02-25 ENCOUNTER — Other Ambulatory Visit (HOSPITAL_COMMUNITY): Payer: Self-pay

## 2023-02-25 DIAGNOSIS — G959 Disease of spinal cord, unspecified: Secondary | ICD-10-CM | POA: Diagnosis not present

## 2023-02-25 DIAGNOSIS — E1159 Type 2 diabetes mellitus with other circulatory complications: Secondary | ICD-10-CM | POA: Diagnosis not present

## 2023-02-25 DIAGNOSIS — I252 Old myocardial infarction: Secondary | ICD-10-CM | POA: Diagnosis not present

## 2023-02-25 DIAGNOSIS — M4802 Spinal stenosis, cervical region: Secondary | ICD-10-CM | POA: Diagnosis not present

## 2023-02-25 DIAGNOSIS — T8189XD Other complications of procedures, not elsewhere classified, subsequent encounter: Secondary | ICD-10-CM | POA: Diagnosis not present

## 2023-02-25 DIAGNOSIS — I2510 Atherosclerotic heart disease of native coronary artery without angina pectoris: Secondary | ICD-10-CM | POA: Diagnosis not present

## 2023-02-28 DIAGNOSIS — T8189XD Other complications of procedures, not elsewhere classified, subsequent encounter: Secondary | ICD-10-CM | POA: Diagnosis not present

## 2023-02-28 DIAGNOSIS — I2510 Atherosclerotic heart disease of native coronary artery without angina pectoris: Secondary | ICD-10-CM | POA: Diagnosis not present

## 2023-02-28 DIAGNOSIS — M4802 Spinal stenosis, cervical region: Secondary | ICD-10-CM | POA: Diagnosis not present

## 2023-02-28 DIAGNOSIS — I252 Old myocardial infarction: Secondary | ICD-10-CM | POA: Diagnosis not present

## 2023-02-28 DIAGNOSIS — E1159 Type 2 diabetes mellitus with other circulatory complications: Secondary | ICD-10-CM | POA: Diagnosis not present

## 2023-02-28 DIAGNOSIS — G959 Disease of spinal cord, unspecified: Secondary | ICD-10-CM | POA: Diagnosis not present

## 2023-03-01 DIAGNOSIS — M4802 Spinal stenosis, cervical region: Secondary | ICD-10-CM | POA: Diagnosis not present

## 2023-03-01 DIAGNOSIS — I252 Old myocardial infarction: Secondary | ICD-10-CM | POA: Diagnosis not present

## 2023-03-01 DIAGNOSIS — G959 Disease of spinal cord, unspecified: Secondary | ICD-10-CM | POA: Diagnosis not present

## 2023-03-01 DIAGNOSIS — I2510 Atherosclerotic heart disease of native coronary artery without angina pectoris: Secondary | ICD-10-CM | POA: Diagnosis not present

## 2023-03-01 DIAGNOSIS — T8189XD Other complications of procedures, not elsewhere classified, subsequent encounter: Secondary | ICD-10-CM | POA: Diagnosis not present

## 2023-03-01 DIAGNOSIS — E1159 Type 2 diabetes mellitus with other circulatory complications: Secondary | ICD-10-CM | POA: Diagnosis not present

## 2023-03-04 DIAGNOSIS — T8189XD Other complications of procedures, not elsewhere classified, subsequent encounter: Secondary | ICD-10-CM | POA: Diagnosis not present

## 2023-03-04 DIAGNOSIS — E1165 Type 2 diabetes mellitus with hyperglycemia: Secondary | ICD-10-CM | POA: Diagnosis not present

## 2023-03-04 DIAGNOSIS — E785 Hyperlipidemia, unspecified: Secondary | ICD-10-CM | POA: Diagnosis not present

## 2023-03-04 DIAGNOSIS — I10 Essential (primary) hypertension: Secondary | ICD-10-CM | POA: Diagnosis not present

## 2023-03-04 DIAGNOSIS — I2510 Atherosclerotic heart disease of native coronary artery without angina pectoris: Secondary | ICD-10-CM | POA: Diagnosis not present

## 2023-03-04 DIAGNOSIS — R972 Elevated prostate specific antigen [PSA]: Secondary | ICD-10-CM | POA: Diagnosis not present

## 2023-03-04 DIAGNOSIS — M4802 Spinal stenosis, cervical region: Secondary | ICD-10-CM | POA: Diagnosis not present

## 2023-03-04 DIAGNOSIS — G959 Disease of spinal cord, unspecified: Secondary | ICD-10-CM | POA: Diagnosis not present

## 2023-03-04 DIAGNOSIS — I252 Old myocardial infarction: Secondary | ICD-10-CM | POA: Diagnosis not present

## 2023-03-04 DIAGNOSIS — D696 Thrombocytopenia, unspecified: Secondary | ICD-10-CM | POA: Diagnosis not present

## 2023-03-04 DIAGNOSIS — E1159 Type 2 diabetes mellitus with other circulatory complications: Secondary | ICD-10-CM | POA: Diagnosis not present

## 2023-03-04 DIAGNOSIS — Z1389 Encounter for screening for other disorder: Secondary | ICD-10-CM | POA: Diagnosis not present

## 2023-03-07 ENCOUNTER — Ambulatory Visit: Payer: Self-pay | Admitting: Interventional Cardiology

## 2023-03-07 DIAGNOSIS — M4802 Spinal stenosis, cervical region: Secondary | ICD-10-CM | POA: Diagnosis not present

## 2023-03-07 DIAGNOSIS — T8189XD Other complications of procedures, not elsewhere classified, subsequent encounter: Secondary | ICD-10-CM | POA: Diagnosis not present

## 2023-03-07 DIAGNOSIS — G959 Disease of spinal cord, unspecified: Secondary | ICD-10-CM | POA: Diagnosis not present

## 2023-03-07 DIAGNOSIS — I252 Old myocardial infarction: Secondary | ICD-10-CM | POA: Diagnosis not present

## 2023-03-07 DIAGNOSIS — I2510 Atherosclerotic heart disease of native coronary artery without angina pectoris: Secondary | ICD-10-CM | POA: Diagnosis not present

## 2023-03-07 DIAGNOSIS — E1159 Type 2 diabetes mellitus with other circulatory complications: Secondary | ICD-10-CM | POA: Diagnosis not present

## 2023-03-08 ENCOUNTER — Other Ambulatory Visit: Payer: Self-pay | Admitting: Obstetrics

## 2023-03-08 ENCOUNTER — Other Ambulatory Visit (HOSPITAL_COMMUNITY): Payer: Self-pay

## 2023-03-08 ENCOUNTER — Other Ambulatory Visit: Payer: Self-pay

## 2023-03-08 DIAGNOSIS — I2510 Atherosclerotic heart disease of native coronary artery without angina pectoris: Secondary | ICD-10-CM | POA: Diagnosis not present

## 2023-03-08 DIAGNOSIS — G959 Disease of spinal cord, unspecified: Secondary | ICD-10-CM | POA: Diagnosis not present

## 2023-03-08 DIAGNOSIS — I252 Old myocardial infarction: Secondary | ICD-10-CM | POA: Diagnosis not present

## 2023-03-08 DIAGNOSIS — T8189XD Other complications of procedures, not elsewhere classified, subsequent encounter: Secondary | ICD-10-CM | POA: Diagnosis not present

## 2023-03-08 DIAGNOSIS — E1159 Type 2 diabetes mellitus with other circulatory complications: Secondary | ICD-10-CM | POA: Diagnosis not present

## 2023-03-08 DIAGNOSIS — M4802 Spinal stenosis, cervical region: Secondary | ICD-10-CM | POA: Diagnosis not present

## 2023-03-08 DIAGNOSIS — N410 Acute prostatitis: Secondary | ICD-10-CM | POA: Diagnosis not present

## 2023-03-08 MED ORDER — DOXYCYCLINE HYCLATE 100 MG PO TABS
100.0000 mg | ORAL_TABLET | Freq: Two times a day (BID) | ORAL | 0 refills | Status: DC
  Filled 2023-03-08 (×2): qty 28, 14d supply, fill #0

## 2023-03-09 ENCOUNTER — Ambulatory Visit: Payer: Medicare Other | Admitting: Podiatry

## 2023-03-09 ENCOUNTER — Other Ambulatory Visit: Payer: Self-pay

## 2023-03-10 DIAGNOSIS — I252 Old myocardial infarction: Secondary | ICD-10-CM | POA: Diagnosis not present

## 2023-03-10 DIAGNOSIS — I2510 Atherosclerotic heart disease of native coronary artery without angina pectoris: Secondary | ICD-10-CM | POA: Diagnosis not present

## 2023-03-10 DIAGNOSIS — E1159 Type 2 diabetes mellitus with other circulatory complications: Secondary | ICD-10-CM | POA: Diagnosis not present

## 2023-03-10 DIAGNOSIS — M4802 Spinal stenosis, cervical region: Secondary | ICD-10-CM | POA: Diagnosis not present

## 2023-03-10 DIAGNOSIS — T8189XD Other complications of procedures, not elsewhere classified, subsequent encounter: Secondary | ICD-10-CM | POA: Diagnosis not present

## 2023-03-10 DIAGNOSIS — G959 Disease of spinal cord, unspecified: Secondary | ICD-10-CM | POA: Diagnosis not present

## 2023-03-11 DIAGNOSIS — T8189XD Other complications of procedures, not elsewhere classified, subsequent encounter: Secondary | ICD-10-CM | POA: Diagnosis not present

## 2023-03-11 DIAGNOSIS — I2510 Atherosclerotic heart disease of native coronary artery without angina pectoris: Secondary | ICD-10-CM | POA: Diagnosis not present

## 2023-03-11 DIAGNOSIS — M4802 Spinal stenosis, cervical region: Secondary | ICD-10-CM | POA: Diagnosis not present

## 2023-03-11 DIAGNOSIS — E1159 Type 2 diabetes mellitus with other circulatory complications: Secondary | ICD-10-CM | POA: Diagnosis not present

## 2023-03-11 DIAGNOSIS — G959 Disease of spinal cord, unspecified: Secondary | ICD-10-CM | POA: Diagnosis not present

## 2023-03-11 DIAGNOSIS — I252 Old myocardial infarction: Secondary | ICD-10-CM | POA: Diagnosis not present

## 2023-03-14 ENCOUNTER — Other Ambulatory Visit (HOSPITAL_COMMUNITY): Payer: Self-pay

## 2023-03-14 DIAGNOSIS — I2510 Atherosclerotic heart disease of native coronary artery without angina pectoris: Secondary | ICD-10-CM | POA: Diagnosis not present

## 2023-03-14 DIAGNOSIS — Z23 Encounter for immunization: Secondary | ICD-10-CM | POA: Diagnosis not present

## 2023-03-14 DIAGNOSIS — Z0000 Encounter for general adult medical examination without abnormal findings: Secondary | ICD-10-CM | POA: Diagnosis not present

## 2023-03-14 DIAGNOSIS — I110 Hypertensive heart disease with heart failure: Secondary | ICD-10-CM | POA: Diagnosis not present

## 2023-03-14 DIAGNOSIS — R000 Tachycardia, unspecified: Secondary | ICD-10-CM | POA: Diagnosis not present

## 2023-03-14 DIAGNOSIS — R972 Elevated prostate specific antigen [PSA]: Secondary | ICD-10-CM | POA: Diagnosis not present

## 2023-03-14 DIAGNOSIS — E1169 Type 2 diabetes mellitus with other specified complication: Secondary | ICD-10-CM | POA: Diagnosis not present

## 2023-03-14 DIAGNOSIS — I5022 Chronic systolic (congestive) heart failure: Secondary | ICD-10-CM | POA: Diagnosis not present

## 2023-03-14 DIAGNOSIS — E66811 Obesity, class 1: Secondary | ICD-10-CM | POA: Diagnosis not present

## 2023-03-14 DIAGNOSIS — E785 Hyperlipidemia, unspecified: Secondary | ICD-10-CM | POA: Diagnosis not present

## 2023-03-14 DIAGNOSIS — E1165 Type 2 diabetes mellitus with hyperglycemia: Secondary | ICD-10-CM | POA: Diagnosis not present

## 2023-03-14 DIAGNOSIS — M545 Low back pain, unspecified: Secondary | ICD-10-CM | POA: Diagnosis not present

## 2023-03-14 DIAGNOSIS — G959 Disease of spinal cord, unspecified: Secondary | ICD-10-CM | POA: Diagnosis not present

## 2023-03-14 DIAGNOSIS — M159 Polyosteoarthritis, unspecified: Secondary | ICD-10-CM | POA: Diagnosis not present

## 2023-03-14 DIAGNOSIS — I10 Essential (primary) hypertension: Secondary | ICD-10-CM | POA: Diagnosis not present

## 2023-03-14 DIAGNOSIS — R82998 Other abnormal findings in urine: Secondary | ICD-10-CM | POA: Diagnosis not present

## 2023-03-15 ENCOUNTER — Other Ambulatory Visit: Payer: Self-pay

## 2023-03-15 ENCOUNTER — Other Ambulatory Visit (HOSPITAL_COMMUNITY): Payer: Self-pay

## 2023-03-15 ENCOUNTER — Encounter: Payer: Self-pay | Admitting: Pharmacist

## 2023-03-15 DIAGNOSIS — E1159 Type 2 diabetes mellitus with other circulatory complications: Secondary | ICD-10-CM | POA: Diagnosis not present

## 2023-03-15 DIAGNOSIS — M4802 Spinal stenosis, cervical region: Secondary | ICD-10-CM | POA: Diagnosis not present

## 2023-03-15 DIAGNOSIS — I2510 Atherosclerotic heart disease of native coronary artery without angina pectoris: Secondary | ICD-10-CM | POA: Diagnosis not present

## 2023-03-15 DIAGNOSIS — I252 Old myocardial infarction: Secondary | ICD-10-CM | POA: Diagnosis not present

## 2023-03-15 DIAGNOSIS — G959 Disease of spinal cord, unspecified: Secondary | ICD-10-CM | POA: Diagnosis not present

## 2023-03-15 DIAGNOSIS — T8189XD Other complications of procedures, not elsewhere classified, subsequent encounter: Secondary | ICD-10-CM | POA: Diagnosis not present

## 2023-03-15 MED ORDER — MOUNJARO 12.5 MG/0.5ML ~~LOC~~ SOAJ
12.5000 mg | SUBCUTANEOUS | 11 refills | Status: AC
  Filled 2023-03-15 – 2023-03-17 (×2): qty 2, 28d supply, fill #0
  Filled 2023-04-14: qty 2, 28d supply, fill #1
  Filled 2023-05-12: qty 2, 28d supply, fill #2
  Filled 2023-06-06: qty 6, 84d supply, fill #3
  Filled 2023-06-09 – 2023-08-25 (×2): qty 2, 28d supply, fill #3
  Filled 2023-09-29: qty 2, 28d supply, fill #4

## 2023-03-15 MED ORDER — DILTIAZEM HCL 30 MG PO TABS
30.0000 mg | ORAL_TABLET | Freq: Two times a day (BID) | ORAL | 1 refills | Status: DC | PRN
Start: 2023-03-14 — End: 2023-05-17
  Filled 2023-03-15: qty 60, 30d supply, fill #0
  Filled 2023-04-26 – 2023-04-27 (×3): qty 60, 30d supply, fill #1

## 2023-03-16 DIAGNOSIS — T8189XD Other complications of procedures, not elsewhere classified, subsequent encounter: Secondary | ICD-10-CM | POA: Diagnosis not present

## 2023-03-16 DIAGNOSIS — E1159 Type 2 diabetes mellitus with other circulatory complications: Secondary | ICD-10-CM | POA: Diagnosis not present

## 2023-03-16 DIAGNOSIS — I252 Old myocardial infarction: Secondary | ICD-10-CM | POA: Diagnosis not present

## 2023-03-16 DIAGNOSIS — I2510 Atherosclerotic heart disease of native coronary artery without angina pectoris: Secondary | ICD-10-CM | POA: Diagnosis not present

## 2023-03-16 DIAGNOSIS — G959 Disease of spinal cord, unspecified: Secondary | ICD-10-CM | POA: Diagnosis not present

## 2023-03-16 DIAGNOSIS — M4802 Spinal stenosis, cervical region: Secondary | ICD-10-CM | POA: Diagnosis not present

## 2023-03-17 ENCOUNTER — Other Ambulatory Visit (HOSPITAL_COMMUNITY): Payer: Self-pay

## 2023-03-17 ENCOUNTER — Other Ambulatory Visit: Payer: Self-pay

## 2023-03-18 DIAGNOSIS — M4802 Spinal stenosis, cervical region: Secondary | ICD-10-CM | POA: Diagnosis not present

## 2023-03-18 DIAGNOSIS — E1159 Type 2 diabetes mellitus with other circulatory complications: Secondary | ICD-10-CM | POA: Diagnosis not present

## 2023-03-18 DIAGNOSIS — I252 Old myocardial infarction: Secondary | ICD-10-CM | POA: Diagnosis not present

## 2023-03-18 DIAGNOSIS — G959 Disease of spinal cord, unspecified: Secondary | ICD-10-CM | POA: Diagnosis not present

## 2023-03-18 DIAGNOSIS — I2510 Atherosclerotic heart disease of native coronary artery without angina pectoris: Secondary | ICD-10-CM | POA: Diagnosis not present

## 2023-03-18 DIAGNOSIS — T8189XD Other complications of procedures, not elsewhere classified, subsequent encounter: Secondary | ICD-10-CM | POA: Diagnosis not present

## 2023-03-21 ENCOUNTER — Other Ambulatory Visit: Payer: Self-pay | Admitting: Interventional Cardiology

## 2023-03-21 ENCOUNTER — Other Ambulatory Visit (HOSPITAL_COMMUNITY): Payer: Self-pay

## 2023-03-21 DIAGNOSIS — M4712 Other spondylosis with myelopathy, cervical region: Secondary | ICD-10-CM | POA: Diagnosis not present

## 2023-03-21 MED ORDER — ATORVASTATIN CALCIUM 40 MG PO TABS
40.0000 mg | ORAL_TABLET | Freq: Every day | ORAL | 0 refills | Status: DC
  Filled 2023-03-21 – 2023-03-22 (×2): qty 30, 30d supply, fill #0

## 2023-03-21 MED ORDER — CARVEDILOL 25 MG PO TABS
25.0000 mg | ORAL_TABLET | Freq: Two times a day (BID) | ORAL | 0 refills | Status: DC
  Filled 2023-03-21 – 2023-03-22 (×2): qty 60, 30d supply, fill #0

## 2023-03-21 NOTE — Telephone Encounter (Signed)
Pt's pharmacy is requesting a refill on clopidogrel. This medication was prescribed in the hospital. Pt has an upcoming appt in January 2025 with Dr. Duke Salvia. Would Dr. Duke Salvia like to refill this medication? Please address

## 2023-03-21 NOTE — Telephone Encounter (Signed)
Provider is out of office today. Patient requesting refill on a medication prescribed in the hospital. Please advise.

## 2023-03-21 NOTE — Telephone Encounter (Signed)
Primary care prescribed in May 2024 with a year's worth of refills.

## 2023-03-22 ENCOUNTER — Other Ambulatory Visit (HOSPITAL_BASED_OUTPATIENT_CLINIC_OR_DEPARTMENT_OTHER): Payer: Self-pay

## 2023-03-22 ENCOUNTER — Other Ambulatory Visit: Payer: Self-pay

## 2023-03-22 ENCOUNTER — Other Ambulatory Visit (HOSPITAL_COMMUNITY): Payer: Self-pay

## 2023-03-22 DIAGNOSIS — M4802 Spinal stenosis, cervical region: Secondary | ICD-10-CM | POA: Diagnosis not present

## 2023-03-22 DIAGNOSIS — T8189XD Other complications of procedures, not elsewhere classified, subsequent encounter: Secondary | ICD-10-CM | POA: Diagnosis not present

## 2023-03-22 DIAGNOSIS — E1159 Type 2 diabetes mellitus with other circulatory complications: Secondary | ICD-10-CM | POA: Diagnosis not present

## 2023-03-22 DIAGNOSIS — I2510 Atherosclerotic heart disease of native coronary artery without angina pectoris: Secondary | ICD-10-CM | POA: Diagnosis not present

## 2023-03-22 DIAGNOSIS — G959 Disease of spinal cord, unspecified: Secondary | ICD-10-CM | POA: Diagnosis not present

## 2023-03-22 DIAGNOSIS — I252 Old myocardial infarction: Secondary | ICD-10-CM | POA: Diagnosis not present

## 2023-03-22 MED ORDER — CLOPIDOGREL BISULFATE 75 MG PO TABS
75.0000 mg | ORAL_TABLET | Freq: Every day | ORAL | 3 refills | Status: DC
  Filled 2023-03-22: qty 30, 30d supply, fill #0
  Filled 2023-04-14: qty 30, 30d supply, fill #1

## 2023-03-23 ENCOUNTER — Other Ambulatory Visit: Payer: Self-pay

## 2023-03-23 DIAGNOSIS — I252 Old myocardial infarction: Secondary | ICD-10-CM | POA: Diagnosis not present

## 2023-03-23 DIAGNOSIS — T8189XD Other complications of procedures, not elsewhere classified, subsequent encounter: Secondary | ICD-10-CM | POA: Diagnosis not present

## 2023-03-23 DIAGNOSIS — G959 Disease of spinal cord, unspecified: Secondary | ICD-10-CM | POA: Diagnosis not present

## 2023-03-23 DIAGNOSIS — I2510 Atherosclerotic heart disease of native coronary artery without angina pectoris: Secondary | ICD-10-CM | POA: Diagnosis not present

## 2023-03-23 DIAGNOSIS — M4802 Spinal stenosis, cervical region: Secondary | ICD-10-CM | POA: Diagnosis not present

## 2023-03-23 DIAGNOSIS — E1159 Type 2 diabetes mellitus with other circulatory complications: Secondary | ICD-10-CM | POA: Diagnosis not present

## 2023-03-24 ENCOUNTER — Other Ambulatory Visit (HOSPITAL_BASED_OUTPATIENT_CLINIC_OR_DEPARTMENT_OTHER): Payer: Self-pay

## 2023-03-24 DIAGNOSIS — E1159 Type 2 diabetes mellitus with other circulatory complications: Secondary | ICD-10-CM | POA: Diagnosis not present

## 2023-03-24 DIAGNOSIS — I5042 Chronic combined systolic (congestive) and diastolic (congestive) heart failure: Secondary | ICD-10-CM | POA: Diagnosis not present

## 2023-03-24 DIAGNOSIS — Z981 Arthrodesis status: Secondary | ICD-10-CM | POA: Diagnosis not present

## 2023-03-24 DIAGNOSIS — Z6834 Body mass index (BMI) 34.0-34.9, adult: Secondary | ICD-10-CM | POA: Diagnosis not present

## 2023-03-24 DIAGNOSIS — Z7902 Long term (current) use of antithrombotics/antiplatelets: Secondary | ICD-10-CM | POA: Diagnosis not present

## 2023-03-24 DIAGNOSIS — Z791 Long term (current) use of non-steroidal anti-inflammatories (NSAID): Secondary | ICD-10-CM | POA: Diagnosis not present

## 2023-03-24 DIAGNOSIS — G4733 Obstructive sleep apnea (adult) (pediatric): Secondary | ICD-10-CM | POA: Diagnosis not present

## 2023-03-24 DIAGNOSIS — Z7984 Long term (current) use of oral hypoglycemic drugs: Secondary | ICD-10-CM | POA: Diagnosis not present

## 2023-03-24 DIAGNOSIS — I2510 Atherosclerotic heart disease of native coronary artery without angina pectoris: Secondary | ICD-10-CM | POA: Diagnosis not present

## 2023-03-24 DIAGNOSIS — D696 Thrombocytopenia, unspecified: Secondary | ICD-10-CM | POA: Diagnosis not present

## 2023-03-24 DIAGNOSIS — Z7985 Long-term (current) use of injectable non-insulin antidiabetic drugs: Secondary | ICD-10-CM | POA: Diagnosis not present

## 2023-03-24 DIAGNOSIS — M130 Polyarthritis, unspecified: Secondary | ICD-10-CM | POA: Diagnosis not present

## 2023-03-24 DIAGNOSIS — R351 Nocturia: Secondary | ICD-10-CM | POA: Diagnosis not present

## 2023-03-24 DIAGNOSIS — I479 Paroxysmal tachycardia, unspecified: Secondary | ICD-10-CM | POA: Diagnosis not present

## 2023-03-24 DIAGNOSIS — N401 Enlarged prostate with lower urinary tract symptoms: Secondary | ICD-10-CM | POA: Diagnosis not present

## 2023-03-24 DIAGNOSIS — G959 Disease of spinal cord, unspecified: Secondary | ICD-10-CM | POA: Diagnosis not present

## 2023-03-24 DIAGNOSIS — I252 Old myocardial infarction: Secondary | ICD-10-CM | POA: Diagnosis not present

## 2023-03-24 DIAGNOSIS — I152 Hypertension secondary to endocrine disorders: Secondary | ICD-10-CM | POA: Diagnosis not present

## 2023-03-24 DIAGNOSIS — T8189XD Other complications of procedures, not elsewhere classified, subsequent encounter: Secondary | ICD-10-CM | POA: Diagnosis not present

## 2023-03-24 DIAGNOSIS — D759 Disease of blood and blood-forming organs, unspecified: Secondary | ICD-10-CM | POA: Diagnosis not present

## 2023-03-24 DIAGNOSIS — E785 Hyperlipidemia, unspecified: Secondary | ICD-10-CM | POA: Diagnosis not present

## 2023-03-24 DIAGNOSIS — M4802 Spinal stenosis, cervical region: Secondary | ICD-10-CM | POA: Diagnosis not present

## 2023-03-24 DIAGNOSIS — R350 Frequency of micturition: Secondary | ICD-10-CM | POA: Diagnosis not present

## 2023-03-24 DIAGNOSIS — H547 Unspecified visual loss: Secondary | ICD-10-CM | POA: Diagnosis not present

## 2023-03-25 DIAGNOSIS — M4802 Spinal stenosis, cervical region: Secondary | ICD-10-CM | POA: Diagnosis not present

## 2023-03-25 DIAGNOSIS — I2510 Atherosclerotic heart disease of native coronary artery without angina pectoris: Secondary | ICD-10-CM | POA: Diagnosis not present

## 2023-03-25 DIAGNOSIS — T8189XD Other complications of procedures, not elsewhere classified, subsequent encounter: Secondary | ICD-10-CM | POA: Diagnosis not present

## 2023-03-25 DIAGNOSIS — G959 Disease of spinal cord, unspecified: Secondary | ICD-10-CM | POA: Diagnosis not present

## 2023-03-25 DIAGNOSIS — I252 Old myocardial infarction: Secondary | ICD-10-CM | POA: Diagnosis not present

## 2023-03-25 DIAGNOSIS — E1159 Type 2 diabetes mellitus with other circulatory complications: Secondary | ICD-10-CM | POA: Diagnosis not present

## 2023-03-28 DIAGNOSIS — D759 Disease of blood and blood-forming organs, unspecified: Secondary | ICD-10-CM | POA: Diagnosis not present

## 2023-03-28 DIAGNOSIS — I5042 Chronic combined systolic (congestive) and diastolic (congestive) heart failure: Secondary | ICD-10-CM | POA: Diagnosis not present

## 2023-03-28 DIAGNOSIS — I479 Paroxysmal tachycardia, unspecified: Secondary | ICD-10-CM | POA: Diagnosis not present

## 2023-03-28 DIAGNOSIS — I152 Hypertension secondary to endocrine disorders: Secondary | ICD-10-CM | POA: Diagnosis not present

## 2023-03-28 DIAGNOSIS — M4802 Spinal stenosis, cervical region: Secondary | ICD-10-CM | POA: Diagnosis not present

## 2023-03-28 DIAGNOSIS — E1159 Type 2 diabetes mellitus with other circulatory complications: Secondary | ICD-10-CM | POA: Diagnosis not present

## 2023-03-28 DIAGNOSIS — I252 Old myocardial infarction: Secondary | ICD-10-CM | POA: Diagnosis not present

## 2023-03-28 DIAGNOSIS — T8189XD Other complications of procedures, not elsewhere classified, subsequent encounter: Secondary | ICD-10-CM | POA: Diagnosis not present

## 2023-03-28 DIAGNOSIS — I2510 Atherosclerotic heart disease of native coronary artery without angina pectoris: Secondary | ICD-10-CM | POA: Diagnosis not present

## 2023-03-28 DIAGNOSIS — M130 Polyarthritis, unspecified: Secondary | ICD-10-CM | POA: Diagnosis not present

## 2023-03-28 DIAGNOSIS — G959 Disease of spinal cord, unspecified: Secondary | ICD-10-CM | POA: Diagnosis not present

## 2023-03-29 DIAGNOSIS — G959 Disease of spinal cord, unspecified: Secondary | ICD-10-CM | POA: Diagnosis not present

## 2023-03-29 DIAGNOSIS — T8189XD Other complications of procedures, not elsewhere classified, subsequent encounter: Secondary | ICD-10-CM | POA: Diagnosis not present

## 2023-03-29 DIAGNOSIS — I2510 Atherosclerotic heart disease of native coronary artery without angina pectoris: Secondary | ICD-10-CM | POA: Diagnosis not present

## 2023-03-29 DIAGNOSIS — E1159 Type 2 diabetes mellitus with other circulatory complications: Secondary | ICD-10-CM | POA: Diagnosis not present

## 2023-03-29 DIAGNOSIS — I252 Old myocardial infarction: Secondary | ICD-10-CM | POA: Diagnosis not present

## 2023-03-29 DIAGNOSIS — M4802 Spinal stenosis, cervical region: Secondary | ICD-10-CM | POA: Diagnosis not present

## 2023-03-30 ENCOUNTER — Other Ambulatory Visit: Payer: Self-pay

## 2023-03-30 ENCOUNTER — Other Ambulatory Visit (HOSPITAL_COMMUNITY): Payer: Self-pay

## 2023-03-30 DIAGNOSIS — I2510 Atherosclerotic heart disease of native coronary artery without angina pectoris: Secondary | ICD-10-CM | POA: Diagnosis not present

## 2023-03-30 DIAGNOSIS — T8189XD Other complications of procedures, not elsewhere classified, subsequent encounter: Secondary | ICD-10-CM | POA: Diagnosis not present

## 2023-03-30 DIAGNOSIS — M4802 Spinal stenosis, cervical region: Secondary | ICD-10-CM | POA: Diagnosis not present

## 2023-03-30 DIAGNOSIS — I252 Old myocardial infarction: Secondary | ICD-10-CM | POA: Diagnosis not present

## 2023-03-30 DIAGNOSIS — E1159 Type 2 diabetes mellitus with other circulatory complications: Secondary | ICD-10-CM | POA: Diagnosis not present

## 2023-03-30 DIAGNOSIS — G959 Disease of spinal cord, unspecified: Secondary | ICD-10-CM | POA: Diagnosis not present

## 2023-03-30 MED ORDER — LEVOFLOXACIN 500 MG PO TABS
500.0000 mg | ORAL_TABLET | Freq: Every day | ORAL | 0 refills | Status: DC
  Filled 2023-03-30 (×3): qty 30, 30d supply, fill #0

## 2023-04-01 DIAGNOSIS — I252 Old myocardial infarction: Secondary | ICD-10-CM | POA: Diagnosis not present

## 2023-04-01 DIAGNOSIS — T8189XD Other complications of procedures, not elsewhere classified, subsequent encounter: Secondary | ICD-10-CM | POA: Diagnosis not present

## 2023-04-01 DIAGNOSIS — I2510 Atherosclerotic heart disease of native coronary artery without angina pectoris: Secondary | ICD-10-CM | POA: Diagnosis not present

## 2023-04-01 DIAGNOSIS — G959 Disease of spinal cord, unspecified: Secondary | ICD-10-CM | POA: Diagnosis not present

## 2023-04-01 DIAGNOSIS — E1159 Type 2 diabetes mellitus with other circulatory complications: Secondary | ICD-10-CM | POA: Diagnosis not present

## 2023-04-01 DIAGNOSIS — M4802 Spinal stenosis, cervical region: Secondary | ICD-10-CM | POA: Diagnosis not present

## 2023-04-05 DIAGNOSIS — I252 Old myocardial infarction: Secondary | ICD-10-CM | POA: Diagnosis not present

## 2023-04-05 DIAGNOSIS — I2510 Atherosclerotic heart disease of native coronary artery without angina pectoris: Secondary | ICD-10-CM | POA: Diagnosis not present

## 2023-04-05 DIAGNOSIS — M4802 Spinal stenosis, cervical region: Secondary | ICD-10-CM | POA: Diagnosis not present

## 2023-04-05 DIAGNOSIS — T8189XD Other complications of procedures, not elsewhere classified, subsequent encounter: Secondary | ICD-10-CM | POA: Diagnosis not present

## 2023-04-05 DIAGNOSIS — E1159 Type 2 diabetes mellitus with other circulatory complications: Secondary | ICD-10-CM | POA: Diagnosis not present

## 2023-04-05 DIAGNOSIS — G959 Disease of spinal cord, unspecified: Secondary | ICD-10-CM | POA: Diagnosis not present

## 2023-04-08 DIAGNOSIS — I252 Old myocardial infarction: Secondary | ICD-10-CM | POA: Diagnosis not present

## 2023-04-08 DIAGNOSIS — R972 Elevated prostate specific antigen [PSA]: Secondary | ICD-10-CM | POA: Diagnosis not present

## 2023-04-08 DIAGNOSIS — G959 Disease of spinal cord, unspecified: Secondary | ICD-10-CM | POA: Diagnosis not present

## 2023-04-08 DIAGNOSIS — M4802 Spinal stenosis, cervical region: Secondary | ICD-10-CM | POA: Diagnosis not present

## 2023-04-08 DIAGNOSIS — I2510 Atherosclerotic heart disease of native coronary artery without angina pectoris: Secondary | ICD-10-CM | POA: Diagnosis not present

## 2023-04-08 DIAGNOSIS — T8189XD Other complications of procedures, not elsewhere classified, subsequent encounter: Secondary | ICD-10-CM | POA: Diagnosis not present

## 2023-04-08 DIAGNOSIS — E1159 Type 2 diabetes mellitus with other circulatory complications: Secondary | ICD-10-CM | POA: Diagnosis not present

## 2023-04-12 ENCOUNTER — Other Ambulatory Visit (HOSPITAL_BASED_OUTPATIENT_CLINIC_OR_DEPARTMENT_OTHER): Payer: Self-pay

## 2023-04-12 ENCOUNTER — Other Ambulatory Visit (HOSPITAL_COMMUNITY): Payer: Self-pay

## 2023-04-12 DIAGNOSIS — I252 Old myocardial infarction: Secondary | ICD-10-CM | POA: Diagnosis not present

## 2023-04-12 DIAGNOSIS — G959 Disease of spinal cord, unspecified: Secondary | ICD-10-CM | POA: Diagnosis not present

## 2023-04-12 DIAGNOSIS — I2510 Atherosclerotic heart disease of native coronary artery without angina pectoris: Secondary | ICD-10-CM | POA: Diagnosis not present

## 2023-04-12 DIAGNOSIS — T8189XD Other complications of procedures, not elsewhere classified, subsequent encounter: Secondary | ICD-10-CM | POA: Diagnosis not present

## 2023-04-12 DIAGNOSIS — M4802 Spinal stenosis, cervical region: Secondary | ICD-10-CM | POA: Diagnosis not present

## 2023-04-12 DIAGNOSIS — M4712 Other spondylosis with myelopathy, cervical region: Secondary | ICD-10-CM | POA: Diagnosis not present

## 2023-04-12 DIAGNOSIS — E1159 Type 2 diabetes mellitus with other circulatory complications: Secondary | ICD-10-CM | POA: Diagnosis not present

## 2023-04-12 MED ORDER — GABAPENTIN 300 MG PO CAPS
300.0000 mg | ORAL_CAPSULE | Freq: Three times a day (TID) | ORAL | 0 refills | Status: DC
  Filled 2023-04-12: qty 90, 30d supply, fill #0

## 2023-04-14 ENCOUNTER — Other Ambulatory Visit: Payer: Self-pay | Admitting: Cardiovascular Disease

## 2023-04-14 ENCOUNTER — Other Ambulatory Visit: Payer: Self-pay

## 2023-04-14 ENCOUNTER — Other Ambulatory Visit (HOSPITAL_COMMUNITY): Payer: Self-pay

## 2023-04-14 MED ORDER — CARVEDILOL 25 MG PO TABS
25.0000 mg | ORAL_TABLET | Freq: Two times a day (BID) | ORAL | 3 refills | Status: DC
  Filled 2023-04-14: qty 60, 30d supply, fill #0
  Filled 2023-05-30: qty 60, 30d supply, fill #1

## 2023-04-14 MED ORDER — ATORVASTATIN CALCIUM 40 MG PO TABS
40.0000 mg | ORAL_TABLET | Freq: Every day | ORAL | 11 refills | Status: DC
  Filled 2023-04-14: qty 30, 30d supply, fill #0
  Filled 2023-05-30: qty 30, 30d supply, fill #1

## 2023-04-15 ENCOUNTER — Other Ambulatory Visit: Payer: Self-pay

## 2023-04-15 DIAGNOSIS — R350 Frequency of micturition: Secondary | ICD-10-CM | POA: Diagnosis not present

## 2023-04-15 DIAGNOSIS — I252 Old myocardial infarction: Secondary | ICD-10-CM | POA: Diagnosis not present

## 2023-04-15 DIAGNOSIS — I2510 Atherosclerotic heart disease of native coronary artery without angina pectoris: Secondary | ICD-10-CM | POA: Diagnosis not present

## 2023-04-15 DIAGNOSIS — T8189XD Other complications of procedures, not elsewhere classified, subsequent encounter: Secondary | ICD-10-CM | POA: Diagnosis not present

## 2023-04-15 DIAGNOSIS — N410 Acute prostatitis: Secondary | ICD-10-CM | POA: Diagnosis not present

## 2023-04-15 DIAGNOSIS — E1159 Type 2 diabetes mellitus with other circulatory complications: Secondary | ICD-10-CM | POA: Diagnosis not present

## 2023-04-15 DIAGNOSIS — N401 Enlarged prostate with lower urinary tract symptoms: Secondary | ICD-10-CM | POA: Diagnosis not present

## 2023-04-15 DIAGNOSIS — M4802 Spinal stenosis, cervical region: Secondary | ICD-10-CM | POA: Diagnosis not present

## 2023-04-15 DIAGNOSIS — R972 Elevated prostate specific antigen [PSA]: Secondary | ICD-10-CM | POA: Diagnosis not present

## 2023-04-15 DIAGNOSIS — G959 Disease of spinal cord, unspecified: Secondary | ICD-10-CM | POA: Diagnosis not present

## 2023-04-18 ENCOUNTER — Other Ambulatory Visit: Payer: Self-pay

## 2023-04-18 ENCOUNTER — Other Ambulatory Visit (HOSPITAL_COMMUNITY): Payer: Self-pay

## 2023-04-19 ENCOUNTER — Other Ambulatory Visit: Payer: Self-pay

## 2023-04-19 DIAGNOSIS — E1159 Type 2 diabetes mellitus with other circulatory complications: Secondary | ICD-10-CM | POA: Diagnosis not present

## 2023-04-19 DIAGNOSIS — I2510 Atherosclerotic heart disease of native coronary artery without angina pectoris: Secondary | ICD-10-CM | POA: Diagnosis not present

## 2023-04-19 DIAGNOSIS — G959 Disease of spinal cord, unspecified: Secondary | ICD-10-CM | POA: Diagnosis not present

## 2023-04-19 DIAGNOSIS — T8189XD Other complications of procedures, not elsewhere classified, subsequent encounter: Secondary | ICD-10-CM | POA: Diagnosis not present

## 2023-04-19 DIAGNOSIS — I252 Old myocardial infarction: Secondary | ICD-10-CM | POA: Diagnosis not present

## 2023-04-19 DIAGNOSIS — M4802 Spinal stenosis, cervical region: Secondary | ICD-10-CM | POA: Diagnosis not present

## 2023-04-20 NOTE — Progress Notes (Addendum)
Anesthesia Review:  PCP: Cardiologist : DR Verdis Prime- LOV 02/16/22 Now- varanasi  Chest x-ray : EKG : Echo :2021 Stress test: 2019  Cardiac Cath :  Activity level:  Sleep Study/ CPAP : Fasting Blood Sugar :      / Checks Blood Sugar -- times a day:   Blood Thinner/ Instructions /Last Dose: ASA / Instructions/ Last Dose :   Plavix-    DM- type 2  Farxig-  Truicity-  Metformin-  Mounjaro    Spoke with wife.  Pt is not available per wife.  Will be available on Monday 04/25/23.  Wife states she is not up to date on his meds.  Plan to call pt on 04/25/2023.

## 2023-04-22 DIAGNOSIS — I252 Old myocardial infarction: Secondary | ICD-10-CM | POA: Diagnosis not present

## 2023-04-22 DIAGNOSIS — I2510 Atherosclerotic heart disease of native coronary artery without angina pectoris: Secondary | ICD-10-CM | POA: Diagnosis not present

## 2023-04-22 DIAGNOSIS — E1159 Type 2 diabetes mellitus with other circulatory complications: Secondary | ICD-10-CM | POA: Diagnosis not present

## 2023-04-22 DIAGNOSIS — M4802 Spinal stenosis, cervical region: Secondary | ICD-10-CM | POA: Diagnosis not present

## 2023-04-22 DIAGNOSIS — G959 Disease of spinal cord, unspecified: Secondary | ICD-10-CM | POA: Diagnosis not present

## 2023-04-22 DIAGNOSIS — T8189XD Other complications of procedures, not elsewhere classified, subsequent encounter: Secondary | ICD-10-CM | POA: Diagnosis not present

## 2023-04-23 DIAGNOSIS — G959 Disease of spinal cord, unspecified: Secondary | ICD-10-CM | POA: Diagnosis not present

## 2023-04-23 DIAGNOSIS — Z791 Long term (current) use of non-steroidal anti-inflammatories (NSAID): Secondary | ICD-10-CM | POA: Diagnosis not present

## 2023-04-23 DIAGNOSIS — M4802 Spinal stenosis, cervical region: Secondary | ICD-10-CM | POA: Diagnosis not present

## 2023-04-23 DIAGNOSIS — I479 Paroxysmal tachycardia, unspecified: Secondary | ICD-10-CM | POA: Diagnosis not present

## 2023-04-23 DIAGNOSIS — G4733 Obstructive sleep apnea (adult) (pediatric): Secondary | ICD-10-CM | POA: Diagnosis not present

## 2023-04-23 DIAGNOSIS — Z981 Arthrodesis status: Secondary | ICD-10-CM | POA: Diagnosis not present

## 2023-04-23 DIAGNOSIS — I5042 Chronic combined systolic (congestive) and diastolic (congestive) heart failure: Secondary | ICD-10-CM | POA: Diagnosis not present

## 2023-04-23 DIAGNOSIS — Z7984 Long term (current) use of oral hypoglycemic drugs: Secondary | ICD-10-CM | POA: Diagnosis not present

## 2023-04-23 DIAGNOSIS — Z7902 Long term (current) use of antithrombotics/antiplatelets: Secondary | ICD-10-CM | POA: Diagnosis not present

## 2023-04-23 DIAGNOSIS — R350 Frequency of micturition: Secondary | ICD-10-CM | POA: Diagnosis not present

## 2023-04-23 DIAGNOSIS — T8189XD Other complications of procedures, not elsewhere classified, subsequent encounter: Secondary | ICD-10-CM | POA: Diagnosis not present

## 2023-04-23 DIAGNOSIS — D696 Thrombocytopenia, unspecified: Secondary | ICD-10-CM | POA: Diagnosis not present

## 2023-04-23 DIAGNOSIS — Z6834 Body mass index (BMI) 34.0-34.9, adult: Secondary | ICD-10-CM | POA: Diagnosis not present

## 2023-04-23 DIAGNOSIS — I2510 Atherosclerotic heart disease of native coronary artery without angina pectoris: Secondary | ICD-10-CM | POA: Diagnosis not present

## 2023-04-23 DIAGNOSIS — E785 Hyperlipidemia, unspecified: Secondary | ICD-10-CM | POA: Diagnosis not present

## 2023-04-23 DIAGNOSIS — R351 Nocturia: Secondary | ICD-10-CM | POA: Diagnosis not present

## 2023-04-23 DIAGNOSIS — I252 Old myocardial infarction: Secondary | ICD-10-CM | POA: Diagnosis not present

## 2023-04-23 DIAGNOSIS — I152 Hypertension secondary to endocrine disorders: Secondary | ICD-10-CM | POA: Diagnosis not present

## 2023-04-23 DIAGNOSIS — Z7985 Long-term (current) use of injectable non-insulin antidiabetic drugs: Secondary | ICD-10-CM | POA: Diagnosis not present

## 2023-04-23 DIAGNOSIS — E1159 Type 2 diabetes mellitus with other circulatory complications: Secondary | ICD-10-CM | POA: Diagnosis not present

## 2023-04-23 DIAGNOSIS — D759 Disease of blood and blood-forming organs, unspecified: Secondary | ICD-10-CM | POA: Diagnosis not present

## 2023-04-23 DIAGNOSIS — M130 Polyarthritis, unspecified: Secondary | ICD-10-CM | POA: Diagnosis not present

## 2023-04-23 DIAGNOSIS — H547 Unspecified visual loss: Secondary | ICD-10-CM | POA: Diagnosis not present

## 2023-04-23 DIAGNOSIS — N401 Enlarged prostate with lower urinary tract symptoms: Secondary | ICD-10-CM | POA: Diagnosis not present

## 2023-04-25 ENCOUNTER — Encounter: Payer: Self-pay | Admitting: Podiatry

## 2023-04-25 ENCOUNTER — Encounter (HOSPITAL_COMMUNITY): Payer: Self-pay | Admitting: Gastroenterology

## 2023-04-25 ENCOUNTER — Ambulatory Visit (INDEPENDENT_AMBULATORY_CARE_PROVIDER_SITE_OTHER): Payer: Medicare Other | Admitting: Podiatry

## 2023-04-25 VITALS — Ht 65.0 in | Wt 208.0 lb

## 2023-04-25 DIAGNOSIS — M79674 Pain in right toe(s): Secondary | ICD-10-CM | POA: Diagnosis not present

## 2023-04-25 DIAGNOSIS — M79675 Pain in left toe(s): Secondary | ICD-10-CM

## 2023-04-25 DIAGNOSIS — B351 Tinea unguium: Secondary | ICD-10-CM | POA: Diagnosis not present

## 2023-04-25 NOTE — Progress Notes (Signed)
Attempted to obtain medical history for pre op call via telephone, unable to reach at this time. HIPAA compliant voicemail message left requesting return call to pre surgical testing department.

## 2023-04-26 ENCOUNTER — Other Ambulatory Visit: Payer: Self-pay

## 2023-04-26 ENCOUNTER — Other Ambulatory Visit (HOSPITAL_COMMUNITY): Payer: Self-pay

## 2023-04-26 DIAGNOSIS — I252 Old myocardial infarction: Secondary | ICD-10-CM | POA: Diagnosis not present

## 2023-04-26 DIAGNOSIS — I2510 Atherosclerotic heart disease of native coronary artery without angina pectoris: Secondary | ICD-10-CM | POA: Diagnosis not present

## 2023-04-26 DIAGNOSIS — T8189XD Other complications of procedures, not elsewhere classified, subsequent encounter: Secondary | ICD-10-CM | POA: Diagnosis not present

## 2023-04-26 DIAGNOSIS — M4802 Spinal stenosis, cervical region: Secondary | ICD-10-CM | POA: Diagnosis not present

## 2023-04-26 DIAGNOSIS — G959 Disease of spinal cord, unspecified: Secondary | ICD-10-CM | POA: Diagnosis not present

## 2023-04-26 DIAGNOSIS — E1159 Type 2 diabetes mellitus with other circulatory complications: Secondary | ICD-10-CM | POA: Diagnosis not present

## 2023-04-26 MED ORDER — GOLYTELY 236 G PO SOLR
ORAL | 0 refills | Status: DC
  Filled 2023-04-26: qty 4000, 1d supply, fill #0
  Filled 2023-04-26: qty 4000, 7d supply, fill #0

## 2023-04-26 MED ORDER — DAPAGLIFLOZIN PROPANEDIOL 10 MG PO TABS
10.0000 mg | ORAL_TABLET | Freq: Every day | ORAL | 3 refills | Status: DC
  Filled 2023-04-26: qty 90, 90d supply, fill #0
  Filled 2023-04-26 – 2023-04-28 (×2): qty 30, 30d supply, fill #0
  Filled 2023-05-30: qty 30, 30d supply, fill #1
  Filled 2023-06-06: qty 90, 90d supply, fill #1
  Filled 2023-06-09 – 2023-08-25 (×2): qty 30, 30d supply, fill #1

## 2023-04-26 MED ORDER — POTASSIUM CHLORIDE CRYS ER 20 MEQ PO TBCR
20.0000 meq | EXTENDED_RELEASE_TABLET | Freq: Every day | ORAL | 11 refills | Status: DC
  Filled 2023-04-26 (×2): qty 30, 30d supply, fill #0
  Filled 2023-05-30: qty 30, 30d supply, fill #1
  Filled 2023-06-30: qty 30, 30d supply, fill #2
  Filled 2023-07-27: qty 30, 30d supply, fill #3
  Filled 2023-08-25: qty 30, 30d supply, fill #4
  Filled 2023-09-29 – 2024-01-14 (×3): qty 30, 30d supply, fill #5

## 2023-04-26 MED ORDER — FUROSEMIDE 40 MG PO TABS
40.0000 mg | ORAL_TABLET | Freq: Every day | ORAL | 11 refills | Status: DC
  Filled 2023-04-26 (×2): qty 30, 30d supply, fill #0
  Filled 2023-05-30: qty 30, 30d supply, fill #1
  Filled 2023-06-30 – 2023-08-25 (×3): qty 30, 30d supply, fill #2
  Filled 2023-09-23: qty 30, 30d supply, fill #3
  Filled 2023-12-20: qty 30, 30d supply, fill #4
  Filled 2024-01-14: qty 30, 30d supply, fill #5
  Filled 2024-02-13: qty 30, 30d supply, fill #6

## 2023-04-26 NOTE — Progress Notes (Signed)
Subjective:   Patient ID: Ryan Bad, MD, male   DOB: 72 y.o.   MRN: [id number]   HPI Patient presents stating that he is starting to feel better his nails are thick he cannot cut them and they get sore   ROS      Objective:  Physical Exam  Neurovascular status intact thick yellow brittle nailbeds 1-5 both feet thickened incurvated nail corners and painful     Assessment:  Chronic mycotic nail infection with pain 1-5 both feet     Plan:  Debridement of painful nailbeds 1-5 both feet no iatrogenic bleeding reappoint 3 months

## 2023-04-27 ENCOUNTER — Other Ambulatory Visit (HOSPITAL_COMMUNITY): Payer: Self-pay

## 2023-04-27 ENCOUNTER — Other Ambulatory Visit: Payer: Self-pay

## 2023-04-28 ENCOUNTER — Other Ambulatory Visit (HOSPITAL_COMMUNITY): Payer: Self-pay

## 2023-04-28 DIAGNOSIS — T8189XD Other complications of procedures, not elsewhere classified, subsequent encounter: Secondary | ICD-10-CM | POA: Diagnosis not present

## 2023-04-28 DIAGNOSIS — M4802 Spinal stenosis, cervical region: Secondary | ICD-10-CM | POA: Diagnosis not present

## 2023-04-28 DIAGNOSIS — E1159 Type 2 diabetes mellitus with other circulatory complications: Secondary | ICD-10-CM | POA: Diagnosis not present

## 2023-04-28 DIAGNOSIS — G959 Disease of spinal cord, unspecified: Secondary | ICD-10-CM | POA: Diagnosis not present

## 2023-04-28 DIAGNOSIS — I252 Old myocardial infarction: Secondary | ICD-10-CM | POA: Diagnosis not present

## 2023-04-28 DIAGNOSIS — I2510 Atherosclerotic heart disease of native coronary artery without angina pectoris: Secondary | ICD-10-CM | POA: Diagnosis not present

## 2023-04-29 ENCOUNTER — Encounter (HOSPITAL_COMMUNITY)
Admission: RE | Disposition: A | Discharge status: Home / Self Care | Payer: Self-pay | Source: Home / Self Care | Attending: Gastroenterology

## 2023-04-29 ENCOUNTER — Ambulatory Visit (HOSPITAL_COMMUNITY): Payer: Medicare Other | Admitting: Anesthesiology

## 2023-04-29 ENCOUNTER — Other Ambulatory Visit (HOSPITAL_COMMUNITY): Payer: Self-pay

## 2023-04-29 ENCOUNTER — Encounter (HOSPITAL_COMMUNITY): Payer: Self-pay | Admitting: Gastroenterology

## 2023-04-29 ENCOUNTER — Ambulatory Visit (HOSPITAL_COMMUNITY)
Admission: RE | Admit: 2023-04-29 | Discharge: 2023-04-29 | Disposition: A | Discharge status: Home / Self Care | Payer: Medicare Other | Attending: Gastroenterology | Admitting: Gastroenterology

## 2023-04-29 ENCOUNTER — Other Ambulatory Visit: Payer: Self-pay

## 2023-04-29 DIAGNOSIS — K219 Gastro-esophageal reflux disease without esophagitis: Secondary | ICD-10-CM | POA: Diagnosis not present

## 2023-04-29 DIAGNOSIS — E119 Type 2 diabetes mellitus without complications: Secondary | ICD-10-CM | POA: Insufficient documentation

## 2023-04-29 DIAGNOSIS — I700 Atherosclerosis of aorta: Secondary | ICD-10-CM | POA: Insufficient documentation

## 2023-04-29 DIAGNOSIS — I5042 Chronic combined systolic (congestive) and diastolic (congestive) heart failure: Secondary | ICD-10-CM | POA: Diagnosis not present

## 2023-04-29 DIAGNOSIS — Z860100 Personal history of colon polyps, unspecified: Secondary | ICD-10-CM

## 2023-04-29 DIAGNOSIS — K5730 Diverticulosis of large intestine without perforation or abscess without bleeding: Secondary | ICD-10-CM

## 2023-04-29 DIAGNOSIS — Z955 Presence of coronary angioplasty implant and graft: Secondary | ICD-10-CM | POA: Insufficient documentation

## 2023-04-29 DIAGNOSIS — I2510 Atherosclerotic heart disease of native coronary artery without angina pectoris: Secondary | ICD-10-CM | POA: Diagnosis not present

## 2023-04-29 DIAGNOSIS — I110 Hypertensive heart disease with heart failure: Secondary | ICD-10-CM | POA: Diagnosis not present

## 2023-04-29 DIAGNOSIS — Z1211 Encounter for screening for malignant neoplasm of colon: Secondary | ICD-10-CM | POA: Diagnosis not present

## 2023-04-29 DIAGNOSIS — Z860101 Personal history of adenomatous and serrated colon polyps: Secondary | ICD-10-CM | POA: Diagnosis not present

## 2023-04-29 DIAGNOSIS — G4730 Sleep apnea, unspecified: Secondary | ICD-10-CM | POA: Insufficient documentation

## 2023-04-29 DIAGNOSIS — I252 Old myocardial infarction: Secondary | ICD-10-CM | POA: Insufficient documentation

## 2023-04-29 DIAGNOSIS — Z6834 Body mass index (BMI) 34.0-34.9, adult: Secondary | ICD-10-CM | POA: Diagnosis not present

## 2023-04-29 DIAGNOSIS — G4733 Obstructive sleep apnea (adult) (pediatric): Secondary | ICD-10-CM | POA: Diagnosis not present

## 2023-04-29 HISTORY — PX: COLONOSCOPY WITH PROPOFOL: SHX5780

## 2023-04-29 LAB — GLUCOSE, CAPILLARY: Glucose-Capillary: 81 mg/dL (ref 70–99)

## 2023-04-29 SURGERY — COLONOSCOPY WITH PROPOFOL
Anesthesia: Monitor Anesthesia Care

## 2023-04-29 MED ORDER — LIDOCAINE HCL 1 % IJ SOLN
INTRAMUSCULAR | Status: DC | PRN
  Administered 2023-04-29: 40 mg via INTRADERMAL

## 2023-04-29 MED ORDER — ONDANSETRON HCL 4 MG/2ML IJ SOLN
INTRAMUSCULAR | Status: DC | PRN
  Administered 2023-04-29: 4 mg via INTRAVENOUS

## 2023-04-29 MED ORDER — PHENYLEPHRINE HCL (PRESSORS) 10 MG/ML IV SOLN
INTRAVENOUS | Status: DC | PRN
  Administered 2023-04-29 (×3): 160 ug via INTRAVENOUS

## 2023-04-29 MED ORDER — SODIUM CHLORIDE 0.9 % IV SOLN
INTRAVENOUS | Status: DC
Start: 2023-04-29 — End: 2023-04-29

## 2023-04-29 MED ORDER — PROPOFOL 500 MG/50ML IV EMUL
INTRAVENOUS | Status: DC | PRN
  Administered 2023-04-29: 120 ug/kg/min via INTRAVENOUS

## 2023-04-29 MED ORDER — VASOPRESSIN 20 UNIT/ML IV SOLN
INTRAVENOUS | Status: DC | PRN
  Administered 2023-04-29: 2 [IU] via INTRAVENOUS
  Administered 2023-04-29: 1 [IU] via INTRAVENOUS

## 2023-04-29 MED ORDER — PROPOFOL 10 MG/ML IV BOLUS
INTRAVENOUS | Status: DC | PRN
  Administered 2023-04-29: 10 mg via INTRAVENOUS
  Administered 2023-04-29: 20 mg via INTRAVENOUS
  Administered 2023-04-29: 10 mg via INTRAVENOUS
  Administered 2023-04-29: 20 mg via INTRAVENOUS
  Administered 2023-04-29: 10 mg via INTRAVENOUS

## 2023-04-29 SURGICAL SUPPLY — 21 items
ELECT REM PT RETURN 9FT ADLT (ELECTROSURGICAL)
ELECTRODE REM PT RTRN 9FT ADLT (ELECTROSURGICAL) IMPLANT
FCP BXJMBJMB 240X2.8X (CUTTING FORCEPS)
FLOOR PAD 36X40 (MISCELLANEOUS) ×1
FORCEPS BIOP RAD 4 LRG CAP 4 (CUTTING FORCEPS) IMPLANT
FORCEPS BIOP RJ4 240 W/NDL (CUTTING FORCEPS)
FORCEPS BXJMBJMB 240X2.8X (CUTTING FORCEPS) IMPLANT
INJECTOR/SNARE I SNARE (MISCELLANEOUS) IMPLANT
LUBRICANT JELLY 4.5OZ STERILE (MISCELLANEOUS) IMPLANT
MANIFOLD NEPTUNE II (INSTRUMENTS) IMPLANT
NDL SCLEROTHERAPY 25GX240 (NEEDLE) IMPLANT
NEEDLE SCLEROTHERAPY 25GX240 (NEEDLE)
PAD FLOOR 36X40 (MISCELLANEOUS) ×1 IMPLANT
PROBE APC STR FIRE (PROBE) IMPLANT
PROBE INJECTION GOLD 7FR (MISCELLANEOUS) IMPLANT
SNARE ROTATE MED OVAL 20MM (MISCELLANEOUS) IMPLANT
SYR 50ML LL SCALE MARK (SYRINGE) IMPLANT
TRAP SPECIMEN MUCOUS 40CC (MISCELLANEOUS) IMPLANT
TUBING ENDO SMARTCAP PENTAX (MISCELLANEOUS) IMPLANT
TUBING IRRIGATION ENDOGATOR (MISCELLANEOUS) ×1 IMPLANT
WATER STERILE IRR 1000ML POUR (IV SOLUTION) IMPLANT

## 2023-04-29 NOTE — Transfer of Care (Signed)
Immediate Anesthesia Transfer of Care Note  Patient: Ryan Bad, MD  Procedure(s) Performed: COLONOSCOPY WITH PROPOFOL  Patient Location: PACU and Endoscopy Unit  Anesthesia Type:MAC  Level of Consciousness: awake, alert , oriented, and patient cooperative  Airway & Oxygen Therapy: Patient Spontanous Breathing and Patient connected to face mask oxygen  Post-op Assessment: Report given to RN and Post -op Vital signs reviewed and stable  Post vital signs: Reviewed and stable  Last Vitals:  Vitals Value Taken Time  BP 125/81 04/29/23 1040  Temp    Pulse 106 04/29/23 1041  Resp 14 04/29/23 1041  SpO2 100 % 04/29/23 1041  Vitals shown include unfiled device data.  Last Pain:  Vitals:   04/29/23 0904  TempSrc: Temporal  PainSc: 7          Complications: No notable events documented.

## 2023-04-29 NOTE — Anesthesia Preprocedure Evaluation (Addendum)
Anesthesia Evaluation  Patient identified by MRN, date of birth, ID band Patient awake    Reviewed: Allergy & Precautions, NPO status , Patient's Chart, lab work & pertinent test results, reviewed documented beta blocker date and time   History of Anesthesia Complications Negative for: history of anesthetic complications  Airway Mallampati: I  TM Distance: >3 FB Neck ROM: Full    Dental  (+) Missing, Caps, Chipped, Dental Advisory Given   Pulmonary sleep apnea (does not use CPAP)    breath sounds clear to auscultation       Cardiovascular hypertension, Pt. on medications and Pt. on home beta blockers (-) angina + CAD, + Past MI and + Cardiac Stents   Rhythm:Regular Rate:Normal  '21 ECHO: EF 45 to 50%.  1. The LV has mildly reduced function, no regional wall motion abnormalities. There is mild concentric LVH with focal moderate basal-septal hypertrophy. Grade I diastolic dysfunction (impaired relaxation).   2. RVF is normal. The right ventricular size is normal.   3. The MV is normal in structure. Trivial mitral valve regurgitation.   4. The aortic valve is tricuspid. There is mild calcification of the aortic valve. There is mild thickening of the aortic valve. AI is not visualized.     Neuro/Psych    GI/Hepatic Neg liver ROS,GERD  Controlled,,  Endo/Other  diabetes (glu 81), Oral Hypoglycemic Agents  Class 3 obesity (BMI 33)Mounjaro: last dose a week ago  Renal/GU      Musculoskeletal  (+) Arthritis ,    Abdominal   Peds  Hematology plavix   Anesthesia Other Findings   Reproductive/Obstetrics                             Anesthesia Physical Anesthesia Plan  ASA: 3  Anesthesia Plan: MAC   Post-op Pain Management: Minimal or no pain anticipated   Induction:   PONV Risk Score and Plan: 1 and Treatment may vary due to age or medical condition  Airway Management Planned: Natural Airway  and Simple Face Mask  Additional Equipment: None  Intra-op Plan:   Post-operative Plan:   Informed Consent: I have reviewed the patients History and Physical, chart, labs and discussed the procedure including the risks, benefits and alternatives for the proposed anesthesia with the patient or authorized representative who has indicated his/her understanding and acceptance.     Dental advisory given  Plan Discussed with: CRNA and Surgeon  Anesthesia Plan Comments:        Anesthesia Quick Evaluation

## 2023-04-29 NOTE — H&P (Signed)
[name], MD HPI: This 72 year old black male presents to the office for colorectal cancer screening. He has 1-2 BM's per day with no obvious blood or mucus in the stool. He takes Tums for acid reflux as needed. He has a good appetite and his weight has been stable. He denies having any complaints of abdominal pain, nausea, vomiting, dysphagia or odynophagia. He denies having a family history of colon cancer, celiac sprue or IBD. His last colonoscopy done on 07/04/2015 revealed scattered diverticula, internal hemorrhoids, a tubular adenoma and hyperplastic polyp were removed.  Past Medical History:  Diagnosis Date   Chronic combined systolic and diastolic CHF (congestive heart failure) (HCC)    Coronary atherosclerosis of native coronary artery    Dental crowns present    Essential hypertension, benign    states under control with meds., has been on med. x 15 yr.   GERD (gastroesophageal reflux disease)    Hepatitis    Drug induced hepatitis   MI (myocardial infarction) (HCC) 10/27/2002   Non-insulin dependent type 2 diabetes mellitus (HCC)    Obesity    Osteoarthritis    hips and knees   Pure hypercholesterolemia    Sleep apnea     Past Surgical History:  Procedure Laterality Date   ANKLE SURGERY Left    as a child   ANTERIOR CERVICAL DECOMP/DISCECTOMY FUSION Left 11/09/2021   Procedure: ANTERIOR CERVICAL DECOMPRESSION/DISCECTOMY FUSION 3 LEVELS C3-C6;  Surgeon: Venita Lick, MD;  Location: [facility];  Service: Orthopedics;  Laterality: Left;  4 hrs 3 C-Bed   ANTERIOR INTEROSSEOUS NERVE DECOMPRESSION Right 04/15/2021   Procedure: ANTERIOR INTEROSSEOUS NERVE DECOMPRESSION;  Surgeon: Dairl Ponder, MD;  Location: [facility];  Service: Orthopedics;  Laterality: Right;  Only need 1 hour for case,  Axillary block/Mac   CARDIAC CATHETERIZATION  10/27/2002   CARPAL TUNNEL RELEASE Right 11/21/2013   CARPAL TUNNEL RELEASE Right 04/15/2021   Procedure: CARPAL TUNNEL  RELEASE;  Surgeon: Dairl Ponder, MD;  Location: [facility];  Service: Orthopedics;  Laterality: Right;   CORONARY STENT PLACEMENT  10/27/2002   mid LAD   TOTAL HIP ARTHROPLASTY Left 04/09/1999   TOTAL HIP ARTHROPLASTY Right    TOTAL KNEE ARTHROPLASTY Left 02/03/2011   TOTAL KNEE ARTHROPLASTY Right 05/11/2021   Procedure: TOTAL KNEE ARTHROPLASTY;  Surgeon: Ollen Gross, MD;  Location: [facility];  Service: Orthopedics;  Laterality: Right;   ULNAR TUNNEL RELEASE Left 06/18/2016   Procedure: LEFT CUBITAL TUNNEL RELEASE;  Surgeon: Dairl Ponder, MD;  Location: [facility];  Service: Orthopedics;  Laterality: Left;    Family History  Problem Relation Age of Onset   Healthy Mother    Healthy Father     Social History:  reports that he has never smoked. He has never used smokeless tobacco. He reports current alcohol use. He reports that he does not use drugs.  Allergies: No Known Allergies  Medications: Scheduled: Continuous:  sodium chloride      Results for orders placed or performed during the hospital encounter of 04/29/23 (from the past 24 hour(s))  Glucose, capillary     Status: None   Collection Time: 04/29/23  9:13 AM  Result Value Ref Range   Glucose-Capillary 81 70 - 99 mg/dL     No results found.  ROS:  As stated above in the HPI otherwise negative.  Blood pressure 131/87, pulse (!) 112, temperature (!) 97.2 F (36.2 C), temperature source Temporal, resp. rate 16, height 5'  5" (1.651 m), weight 90.7 kg, SpO2 98%.    PE: Gen: NAD, Alert and Oriented HEENT:  [place]/AT, EOMI Neck: Supple, no LAD Lungs: CTA Bilaterally CV: RRR without M/G/R ABD: Soft, NTND, +BS Ext: No C/C/E  Assessment/Plan: 1) Personal history of polyp - colonoscopy.  Shirleen Mcfaul D 04/29/2023, 9:22 AM

## 2023-04-29 NOTE — Anesthesia Postprocedure Evaluation (Signed)
Anesthesia Post Note  Patient: [name], MD  Procedure(s) Performed: COLONOSCOPY WITH PROPOFOL     Patient location during evaluation: Endoscopy Anesthesia Type: MAC Level of consciousness: awake and alert, patient cooperative and oriented Pain management: pain level controlled Vital Signs Assessment: post-procedure vital signs reviewed and stable Respiratory status: spontaneous breathing, nonlabored ventilation and respiratory function stable Cardiovascular status: stable and blood pressure returned to baseline Postop Assessment: no apparent nausea or vomiting and able to ambulate Anesthetic complications: no   No notable events documented.  Last Vitals:  Vitals:   04/29/23 1037 04/29/23 1040  BP: 130/83 125/81  Pulse: (!) 109 (!) 107  Resp: 18 18  Temp: 36.6 C   SpO2: 100% 100%    Last Pain:  Vitals:   04/29/23 1040  TempSrc:   PainSc: 0-No pain                 Jahad Old,E. Serjio Deupree

## 2023-04-29 NOTE — Discharge Instructions (Signed)

## 2023-04-29 NOTE — Op Note (Signed)
[facility] Patient Name: Ryan Walsh Procedure Date: 04/29/2023 MRN: [id number] Attending MD: Jeani Hawking , MD, [id number] Date of Birth: 30-Dec-1950 CSN: [id number] Age: 72 Admit Type: Outpatient Procedure:                Colonoscopy Indications:              High risk colon cancer surveillance: Personal                            history of colonic polyps Providers:                Jeani Hawking, MD, Marge Duncans, RN, Sunday Corn                            [place], Technician Referring MD:              Medicines:                Propofol per Anesthesia Complications:            No immediate complications. Estimated Blood Loss:     Estimated blood loss: none. Procedure:                Pre-Anesthesia Assessment:                           - Prior to the procedure, a History and Physical                            was performed, and patient medications and                            allergies were reviewed. The patient's tolerance of                            previous anesthesia was also reviewed. The risks                            and benefits of the procedure and the sedation                            options and risks were discussed with the patient.                            All questions were answered, and informed consent                            was obtained. Prior Anticoagulants: The patient has                            taken no anticoagulant or antiplatelet agents. ASA                            Grade Assessment: III - A patient with severe                            systemic  disease. After reviewing the risks and                            benefits, the patient was deemed in satisfactory                            condition to undergo the procedure.                           - Sedation was administered by an anesthesia                            professional. Deep sedation was attained.                           After obtaining informed consent, the  colonoscope                            was passed under direct vision. Throughout the                            procedure, the patient's blood pressure, pulse, and                            oxygen saturations were monitored continuously. The                            CF-HQ190L ([id number]) Olympus colonoscope was                            introduced through the anus and advanced to the the                            cecum, identified by appendiceal orifice and                            ileocecal valve. The colonoscopy was somewhat                            difficult due to significant looping. Successful                            completion of the procedure was aided by using                            manual pressure and straightening and shortening                            the scope to obtain bowel loop reduction. The                            patient tolerated the procedure well. The quality  of the bowel preparation was evaluated using the                            BBPS [facility] Bowel Preparation Scale) with scores                            of: Right Colon = 3 (entire mucosa seen well with                            no residual staining, small fragments of stool or                            opaque liquid), Transverse Colon = 3 (entire mucosa                            seen well with no residual staining, small                            fragments of stool or opaque liquid) and Left Colon                            = 2 (minor amount of residual staining, small                            fragments of stool and/or opaque liquid, but mucosa                            seen well). The total BBPS score equals 8. The                            quality of the bowel preparation was good. The                            ileocecal valve, appendiceal orifice, and rectum                            were photographed. Scope In: 10:11:14 AM Scope Out: 10:28:17  AM Scope Withdrawal Time: 0 hours 11 minutes 40 seconds  Total Procedure Duration: 0 hours 17 minutes 3 seconds  Findings:      Scattered large-mouthed, medium-mouthed and small-mouthed diverticula       were found in the sigmoid colon and hepatic flexure. Impression:               - Diverticulosis in the sigmoid colon and at the                            hepatic flexure.                           - No specimens collected. Moderate Sedation:      Not Applicable - Patient had care per Anesthesia. Recommendation:           - Patient has a contact number available for  emergencies. The signs and symptoms of potential                            delayed complications were discussed with the                            patient. Return to normal activities tomorrow.                            Written discharge instructions were provided to the                            patient.                           - Resume previous diet.                           - Continue present medications.                           - Repeat colonoscopy is not recommended for                            surveillance. Procedure Code(s):        --- Professional ---                           [phone], Colonoscopy, flexible; diagnostic, including                            collection of specimen(s) by brushing or washing,                            when performed (separate procedure) Diagnosis Code(s):        --- Professional ---                           Z86.010, Personal history of colonic polyps                           K57.30, Diverticulosis of large intestine without                            perforation or abscess without bleeding CPT copyright 2022 American Medical Association. All rights reserved. The codes documented in this report are preliminary and upon coder review may  be revised to meet current compliance requirements. Jeani Hawking, MD Jeani Hawking, MD 04/29/2023 10:34:09  AM This report has been signed electronically. Number of Addenda: 0

## 2023-05-02 ENCOUNTER — Other Ambulatory Visit (HOSPITAL_COMMUNITY): Payer: Self-pay

## 2023-05-02 ENCOUNTER — Encounter (HOSPITAL_COMMUNITY): Payer: Self-pay | Admitting: Gastroenterology

## 2023-05-02 DIAGNOSIS — T8189XD Other complications of procedures, not elsewhere classified, subsequent encounter: Secondary | ICD-10-CM | POA: Diagnosis not present

## 2023-05-02 DIAGNOSIS — G959 Disease of spinal cord, unspecified: Secondary | ICD-10-CM | POA: Diagnosis not present

## 2023-05-02 DIAGNOSIS — I2510 Atherosclerotic heart disease of native coronary artery without angina pectoris: Secondary | ICD-10-CM | POA: Diagnosis not present

## 2023-05-02 DIAGNOSIS — N410 Acute prostatitis: Secondary | ICD-10-CM | POA: Diagnosis not present

## 2023-05-02 DIAGNOSIS — E1159 Type 2 diabetes mellitus with other circulatory complications: Secondary | ICD-10-CM | POA: Diagnosis not present

## 2023-05-02 DIAGNOSIS — M4802 Spinal stenosis, cervical region: Secondary | ICD-10-CM | POA: Diagnosis not present

## 2023-05-02 DIAGNOSIS — I252 Old myocardial infarction: Secondary | ICD-10-CM | POA: Diagnosis not present

## 2023-05-04 ENCOUNTER — Other Ambulatory Visit (HOSPITAL_COMMUNITY): Payer: Self-pay

## 2023-05-04 DIAGNOSIS — I110 Hypertensive heart disease with heart failure: Secondary | ICD-10-CM | POA: Diagnosis not present

## 2023-05-04 DIAGNOSIS — H53021 Refractive amblyopia, right eye: Secondary | ICD-10-CM | POA: Diagnosis not present

## 2023-05-04 DIAGNOSIS — R000 Tachycardia, unspecified: Secondary | ICD-10-CM | POA: Diagnosis not present

## 2023-05-04 DIAGNOSIS — H40013 Open angle with borderline findings, low risk, bilateral: Secondary | ICD-10-CM | POA: Diagnosis not present

## 2023-05-04 DIAGNOSIS — E1165 Type 2 diabetes mellitus with hyperglycemia: Secondary | ICD-10-CM | POA: Diagnosis not present

## 2023-05-04 DIAGNOSIS — H25813 Combined forms of age-related cataract, bilateral: Secondary | ICD-10-CM | POA: Diagnosis not present

## 2023-05-04 DIAGNOSIS — E119 Type 2 diabetes mellitus without complications: Secondary | ICD-10-CM | POA: Diagnosis not present

## 2023-05-04 DIAGNOSIS — I5022 Chronic systolic (congestive) heart failure: Secondary | ICD-10-CM | POA: Diagnosis not present

## 2023-05-04 DIAGNOSIS — R600 Localized edema: Secondary | ICD-10-CM | POA: Diagnosis not present

## 2023-05-04 LAB — HM DIABETES EYE EXAM

## 2023-05-04 MED ORDER — FUROSEMIDE 40 MG PO TABS
40.0000 mg | ORAL_TABLET | Freq: Two times a day (BID) | ORAL | 11 refills | Status: DC | PRN
  Filled 2023-05-04: qty 60, 30d supply, fill #0

## 2023-05-05 ENCOUNTER — Other Ambulatory Visit (HOSPITAL_COMMUNITY): Payer: Self-pay

## 2023-05-06 DIAGNOSIS — G959 Disease of spinal cord, unspecified: Secondary | ICD-10-CM | POA: Diagnosis not present

## 2023-05-06 DIAGNOSIS — I2510 Atherosclerotic heart disease of native coronary artery without angina pectoris: Secondary | ICD-10-CM | POA: Diagnosis not present

## 2023-05-06 DIAGNOSIS — E1159 Type 2 diabetes mellitus with other circulatory complications: Secondary | ICD-10-CM | POA: Diagnosis not present

## 2023-05-06 DIAGNOSIS — I252 Old myocardial infarction: Secondary | ICD-10-CM | POA: Diagnosis not present

## 2023-05-06 DIAGNOSIS — T8189XD Other complications of procedures, not elsewhere classified, subsequent encounter: Secondary | ICD-10-CM | POA: Diagnosis not present

## 2023-05-06 DIAGNOSIS — M4802 Spinal stenosis, cervical region: Secondary | ICD-10-CM | POA: Diagnosis not present

## 2023-05-10 ENCOUNTER — Ambulatory Visit (HOSPITAL_BASED_OUTPATIENT_CLINIC_OR_DEPARTMENT_OTHER): Payer: Medicare Other | Admitting: Physical Therapy

## 2023-05-10 DIAGNOSIS — I252 Old myocardial infarction: Secondary | ICD-10-CM | POA: Diagnosis not present

## 2023-05-10 DIAGNOSIS — E1159 Type 2 diabetes mellitus with other circulatory complications: Secondary | ICD-10-CM | POA: Diagnosis not present

## 2023-05-10 DIAGNOSIS — T8189XD Other complications of procedures, not elsewhere classified, subsequent encounter: Secondary | ICD-10-CM | POA: Diagnosis not present

## 2023-05-10 DIAGNOSIS — I2510 Atherosclerotic heart disease of native coronary artery without angina pectoris: Secondary | ICD-10-CM | POA: Diagnosis not present

## 2023-05-10 DIAGNOSIS — M4802 Spinal stenosis, cervical region: Secondary | ICD-10-CM | POA: Diagnosis not present

## 2023-05-10 DIAGNOSIS — G959 Disease of spinal cord, unspecified: Secondary | ICD-10-CM | POA: Diagnosis not present

## 2023-05-12 ENCOUNTER — Other Ambulatory Visit (HOSPITAL_COMMUNITY): Payer: Self-pay

## 2023-05-13 ENCOUNTER — Other Ambulatory Visit (HOSPITAL_COMMUNITY): Payer: Self-pay

## 2023-05-13 ENCOUNTER — Emergency Department (HOSPITAL_COMMUNITY)
Admission: EM | Admit: 2023-05-13 | Discharge: 2023-05-14 | Disposition: A | Discharge status: Home / Self Care | Payer: Medicare Other | Attending: Emergency Medicine | Admitting: Emergency Medicine

## 2023-05-13 ENCOUNTER — Other Ambulatory Visit: Payer: Self-pay

## 2023-05-13 DIAGNOSIS — I252 Old myocardial infarction: Secondary | ICD-10-CM | POA: Diagnosis not present

## 2023-05-13 DIAGNOSIS — I2510 Atherosclerotic heart disease of native coronary artery without angina pectoris: Secondary | ICD-10-CM | POA: Diagnosis not present

## 2023-05-13 DIAGNOSIS — R079 Chest pain, unspecified: Secondary | ICD-10-CM | POA: Diagnosis not present

## 2023-05-13 DIAGNOSIS — G959 Disease of spinal cord, unspecified: Secondary | ICD-10-CM | POA: Diagnosis not present

## 2023-05-13 DIAGNOSIS — R000 Tachycardia, unspecified: Secondary | ICD-10-CM | POA: Diagnosis not present

## 2023-05-13 DIAGNOSIS — I471 Supraventricular tachycardia, unspecified: Secondary | ICD-10-CM | POA: Diagnosis not present

## 2023-05-13 DIAGNOSIS — T8189XD Other complications of procedures, not elsewhere classified, subsequent encounter: Secondary | ICD-10-CM | POA: Diagnosis not present

## 2023-05-13 DIAGNOSIS — E1159 Type 2 diabetes mellitus with other circulatory complications: Secondary | ICD-10-CM | POA: Diagnosis not present

## 2023-05-13 DIAGNOSIS — I10 Essential (primary) hypertension: Secondary | ICD-10-CM | POA: Diagnosis not present

## 2023-05-13 DIAGNOSIS — M4802 Spinal stenosis, cervical region: Secondary | ICD-10-CM | POA: Diagnosis not present

## 2023-05-13 DIAGNOSIS — R0789 Other chest pain: Secondary | ICD-10-CM | POA: Diagnosis present

## 2023-05-13 NOTE — ED Triage Notes (Addendum)
Patient via [facility] from home c/o chest pain / palpitations with sudden onset. SVT noted on EMS arrival wsith HR @164 . Given adenosine 6mg  IVP given with improvement to sinus tach @120 . Chest pain resolved after adenosine.   Patient took 324 mg aspirin prior to ems arrival.  Reports takes carvedilol @ home. 18g LAC. Otherwise vitals stable

## 2023-05-13 NOTE — ED Provider Notes (Signed)
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  Patient brought to the emergency department by EMS from home after developing chest discomfort and heart palpitations.  EMS identified SVT 160s.  He was converted with adenosine 6 mg IV push by EMS.  Patient reports that this happened 1 time in the past, at that time it was attributed to him running out of his beta-blocker for several days.  He has been taking his medications as prescribed.  Discomfort immediately resolved when he went into sinus rhythm.       Home Medications Prior to Admission medications   Medication Sig Start Date End Date Taking? Authorizing Provider  atorvastatin (LIPITOR) 40 MG tablet Take 1 tablet (40 mg total) by mouth at bedtime. 04/14/23   Chilton Si, MD  calcium carbonate (TUMS - DOSED IN MG ELEMENTAL CALCIUM) 500 MG chewable tablet Chew 1,000 mg by mouth daily as needed for indigestion or heartburn.    [provider]  carvedilol (COREG) 25 MG tablet Take 1 tablet (25 mg total) by mouth 2 (two) times daily with meals. 04/14/23   Chilton Si, MD  cefUROXime (CEFTIN) 500 MG tablet Take 1 tablet (500 mg total) by mouth 2 (two) times daily. 01/26/23     celecoxib (CELEBREX) 200 MG capsule Take 1 capsule (200 mg total) by mouth 2 (two) times daily. 01/26/23     clopidogrel (PLAVIX) 75 MG tablet Take 1 tablet (75 mg total) by mouth in the morning. 12/15/22     cyclobenzaprine (FLEXERIL) 10 MG tablet Take 1 tablet (10 mg total) by mouth every 6 (six) hours as needed for spasm 12/15/22     dapagliflozin propanediol (FARXIGA) 10 MG TABS tablet Take 1 tablet (10 mg total) by mouth in the morning. 12/15/22     dapagliflozin propanediol (FARXIGA) 10  MG TABS tablet Take 1 tablet (10 mg total) by mouth daily. 04/26/23     diclofenac Sodium (VOLTAREN ARTHRITIS PAIN) 1 % GEL Apply 4 grams topically 3 (three) times daily to bilateral wrists and ankles 12/15/22     diltiazem (CARDIZEM) 30 MG tablet Take 1 tablet (30 mg total) by mouth 2 (two) times daily as needed for tachycardia. 03/14/23     doxycycline (VIBRA-TABS) 100 MG tablet Take 1 tablet (100 mg total) by mouth 2 (two) times daily. 03/08/23     furosemide (LASIX) 40 MG tablet Take 1 tablet (40 mg total) by mouth daily. 04/26/23     furosemide (LASIX) 40 MG tablet Take 1 tablet (40 mg total) by mouth 2 (two) times daily as needed. (to replace Triamterene/HCT) 05/04/23     gabapentin (NEURONTIN) 300 MG capsule Take 1 capsule (300 mg total) by mouth 3 (three) times daily. 04/12/23     levofloxacin (LEVAQUIN) 500 MG tablet Take 1 tablet (500 mg total) by mouth daily. 03/30/23     MELATONIN PO Take 15 mg by mouth at bedtime as needed (sleep).    [provider]  metFORMIN (GLUCOPHAGE-XR) 500 MG 24 hr tablet Take 2 tablets (1,000 mg total) by mouth 2 (two) times daily. 09/23/22     oxyCODONE (OXY IR/ROXICODONE) 5 MG immediate release tablet Take 2 tablets (10 mg total) by mouth every  5 (five) hours as needed for pain (Scale 7-10) 12/15/22     oxyCODONE (OXY IR/ROXICODONE) 5 MG immediate release tablet Take 2 tablets (10 mg total) by mouth every 4 (four) hours as needed for pain 01/26/23     potassium chloride SA (KLOR-CON M20) 20 MEQ tablet Take 1 tablet (20 mEq total) by mouth daily with food. 04/26/23     ramipril (ALTACE) 10 MG capsule Take 1 capsule (10 mg total) by mouth 2 (two) times daily. 09/23/22     tirzepatide (MOUNJARO) 12.5 MG/0.5ML Pen Inject 12.5 mg into the skin once a week. 03/14/23     triamterene-hydrochlorothiazide (DYAZIDE) 37.5-25 MG capsule Take 1 capsule by mouth every morning. Please keep scheduled appointment for future refills. Thank you. 02/17/23   Corky Crafts, MD   Vibegron (GEMTESA) 75 MG TABS Take 1 tablet (75 mg total) by mouth daily. 01/20/23     amLODipine (NORVASC) 5 MG tablet Take 1 tablet (5 mg total) by mouth daily. Please keep scheduled appointment for future refills. Thank you. 02/17/23 04/18/23  Corky Crafts, MD  ezetimibe (ZETIA) 10 MG tablet Take 1 tablet (10 mg total) by mouth daily. 02/23/23 04/26/23  Chilton Si, MD      Allergies    Patient has no known allergies.    Review of Systems   Review of Systems  Physical Exam Updated Vital Signs BP 122/83 (BP Location: Right Arm)   Pulse (!) 109   Temp 98.5 F (36.9 C) (Oral)   Resp (!) 23   Ht 5\' 5"  (1.651 m)   Wt 88 kg   SpO2 100%   BMI 32.28 kg/m  Physical Exam Vitals and nursing note reviewed.  Constitutional:      General: He is not in acute distress.    Appearance: He is well-developed.  HENT:     Head: Normocephalic and atraumatic.     Mouth/Throat:     Mouth: Mucous membranes are moist.  Eyes:     General: Vision grossly intact. Gaze aligned appropriately.     Extraocular Movements: Extraocular movements intact.     Conjunctiva/sclera: Conjunctivae normal.  Cardiovascular:     Rate and Rhythm: Regular rhythm. Tachycardia present.     Pulses: Normal pulses.     Heart sounds: Normal heart sounds, S1 normal and S2 normal. No murmur heard.    No friction rub. No gallop.  Pulmonary:     Effort: Pulmonary effort is normal. No respiratory distress.     Breath sounds: Normal breath sounds.  Abdominal:     Palpations: Abdomen is soft.     Tenderness: There is no abdominal tenderness. There is no guarding or rebound.     Hernia: No hernia is present.  Musculoskeletal:        General: No swelling.     Cervical back: Full passive range of motion without pain, normal range of motion and neck supple. No pain with movement, spinous process tenderness or muscular tenderness. Normal range of motion.     Right lower leg: No edema.     Left lower leg: No edema.   Skin:    General: Skin is warm and dry.     Capillary Refill: Capillary refill takes less than 2 seconds.     Findings: No ecchymosis, erythema, lesion or wound.  Neurological:     Mental Status: He is alert and oriented to person, place, and time.     GCS: GCS eye subscore is 4. GCS verbal subscore  is 5. GCS motor subscore is 6.     Cranial Nerves: Cranial nerves 2-12 are intact.     Sensory: Sensation is intact.     Motor: Motor function is intact. No weakness or abnormal muscle tone.     Coordination: Coordination is intact.  Psychiatric:        Mood and Affect: Mood normal.        Speech: Speech normal.        Behavior: Behavior normal.     ED Results / Procedures / Treatments   Labs (all labs ordered are listed, but only abnormal results are displayed) Labs Reviewed - No data to display  EKG None  Radiology No results found.  Procedures Procedures  {Document cardiac monitor, telemetry assessment procedure when appropriate:1}  Medications Ordered in ED Medications - No data to display  ED Course/ Medical Decision Making/ A&P   {   Click here for ABCD2, HEART and other calculatorsREFRESH Note before signing :1}                              Medical Decision Making  ***  {Document critical care time when appropriate:1} {Document review of labs and clinical decision tools ie heart score, Chads2Vasc2 etc:1}  {Document your independent review of radiology images, and any outside records:1} {Document your discussion with family members, caretakers, and with consultants:1} {Document social determinants of health affecting pt's care:1} {Document your decision making why or why not admission, treatments were needed:1} Final Clinical Impression(s) / ED Diagnoses Final diagnoses:  None    Rx / DC Orders ED Discharge Orders     None

## 2023-05-14 DIAGNOSIS — I471 Supraventricular tachycardia, unspecified: Secondary | ICD-10-CM | POA: Diagnosis not present

## 2023-05-14 LAB — TROPONIN I (HIGH SENSITIVITY): Troponin I (High Sensitivity): 73 ng/L — ABNORMAL HIGH (ref <18)

## 2023-05-14 LAB — BASIC METABOLIC PANEL
Anion gap: 9 (ref 5–15)
BUN: 20 mg/dL (ref 8–23)
CO2: 23 mmol/L (ref 22–32)
Calcium: 8.6 mg/dL — ABNORMAL LOW (ref 8.9–10.3)
Chloride: 108 mmol/L (ref 98–111)
Creatinine, Ser: 0.89 mg/dL (ref 0.61–1.24)
GFR, Estimated: >60 mL/min (ref >60)
Glucose, Bld: 126 mg/dL — ABNORMAL HIGH (ref 70–99)
Potassium: 3.9 mmol/L (ref 3.5–5.1)
Sodium: 140 mmol/L (ref 135–145)

## 2023-05-14 LAB — CBC
HCT: 36.8 % — ABNORMAL LOW (ref 39.0–52.0)
Hemoglobin: 11.5 g/dL — ABNORMAL LOW (ref 13.0–17.0)
MCH: 26.9 pg (ref 26.0–34.0)
MCHC: 31.3 g/dL (ref 30.0–36.0)
MCV: 86 fL (ref 80.0–100.0)
Platelets: 199 10*3/uL (ref 150–400)
RBC: 4.28 MIL/uL (ref 4.22–5.81)
RDW: 14.6 % (ref 11.5–15.5)
WBC: 5.1 10*3/uL (ref 4.0–10.5)
nRBC: 0 % (ref 0.0–0.2)

## 2023-05-14 LAB — MAGNESIUM: Magnesium: 1.8 mg/dL (ref 1.7–2.4)

## 2023-05-14 MED ORDER — METOPROLOL TARTRATE 25 MG PO TABS
25.0000 mg | ORAL_TABLET | Freq: Once | ORAL | Status: AC
  Administered 2023-05-14: 25 mg via ORAL
  Filled 2023-05-14: qty 1

## 2023-05-16 ENCOUNTER — Other Ambulatory Visit: Payer: Self-pay

## 2023-05-16 ENCOUNTER — Emergency Department (HOSPITAL_COMMUNITY): Payer: Medicare Other

## 2023-05-16 ENCOUNTER — Encounter (HOSPITAL_COMMUNITY): Payer: Self-pay

## 2023-05-16 ENCOUNTER — Emergency Department (HOSPITAL_COMMUNITY)
Admission: EM | Admit: 2023-05-16 | Discharge: 2023-05-17 | Disposition: A | Discharge status: Home / Self Care | Payer: Medicare Other | Attending: Emergency Medicine | Admitting: Emergency Medicine

## 2023-05-16 DIAGNOSIS — I471 Supraventricular tachycardia, unspecified: Secondary | ICD-10-CM | POA: Diagnosis not present

## 2023-05-16 DIAGNOSIS — R079 Chest pain, unspecified: Secondary | ICD-10-CM | POA: Diagnosis not present

## 2023-05-16 DIAGNOSIS — R0989 Other specified symptoms and signs involving the circulatory and respiratory systems: Secondary | ICD-10-CM | POA: Diagnosis not present

## 2023-05-16 DIAGNOSIS — I5042 Chronic combined systolic (congestive) and diastolic (congestive) heart failure: Secondary | ICD-10-CM | POA: Insufficient documentation

## 2023-05-16 DIAGNOSIS — R600 Localized edema: Secondary | ICD-10-CM | POA: Diagnosis not present

## 2023-05-16 DIAGNOSIS — R000 Tachycardia, unspecified: Secondary | ICD-10-CM | POA: Diagnosis not present

## 2023-05-16 DIAGNOSIS — I2510 Atherosclerotic heart disease of native coronary artery without angina pectoris: Secondary | ICD-10-CM | POA: Diagnosis not present

## 2023-05-16 DIAGNOSIS — Z96643 Presence of artificial hip joint, bilateral: Secondary | ICD-10-CM | POA: Insufficient documentation

## 2023-05-16 DIAGNOSIS — I4719 Other supraventricular tachycardia: Secondary | ICD-10-CM

## 2023-05-16 DIAGNOSIS — I110 Hypertensive heart disease with heart failure: Secondary | ICD-10-CM | POA: Insufficient documentation

## 2023-05-16 DIAGNOSIS — Z96653 Presence of artificial knee joint, bilateral: Secondary | ICD-10-CM | POA: Diagnosis not present

## 2023-05-16 DIAGNOSIS — E119 Type 2 diabetes mellitus without complications: Secondary | ICD-10-CM | POA: Diagnosis not present

## 2023-05-16 DIAGNOSIS — I517 Cardiomegaly: Secondary | ICD-10-CM | POA: Diagnosis not present

## 2023-05-16 LAB — CBC WITH DIFFERENTIAL/PLATELET
Abs Immature Granulocytes: 0.01 10*3/uL (ref 0.00–0.07)
Basophils Absolute: 0 10*3/uL (ref 0.0–0.1)
Basophils Relative: 1 %
Eosinophils Absolute: 0.2 10*3/uL (ref 0.0–0.5)
Eosinophils Relative: 3 %
HCT: 40.7 % (ref 39.0–52.0)
Hemoglobin: 12.9 g/dL — ABNORMAL LOW (ref 13.0–17.0)
Immature Granulocytes: 0 %
Lymphocytes Relative: 21 %
Lymphs Abs: 1.2 10*3/uL (ref 0.7–4.0)
MCH: 26.9 pg (ref 26.0–34.0)
MCHC: 31.7 g/dL (ref 30.0–36.0)
MCV: 85 fL (ref 80.0–100.0)
Monocytes Absolute: 0.7 10*3/uL (ref 0.1–1.0)
Monocytes Relative: 13 %
Neutro Abs: 3.5 10*3/uL (ref 1.7–7.7)
Neutrophils Relative %: 62 %
Platelets: 234 10*3/uL (ref 150–400)
RBC: 4.79 MIL/uL (ref 4.22–5.81)
RDW: 14.8 % (ref 11.5–15.5)
WBC: 5.6 10*3/uL (ref 4.0–10.5)
nRBC: 0 % (ref 0.0–0.2)

## 2023-05-16 LAB — COMPREHENSIVE METABOLIC PANEL
ALT: 15 U/L (ref 0–44)
AST: 23 U/L (ref 15–41)
Albumin: 3.8 g/dL (ref 3.5–5.0)
Alkaline Phosphatase: 69 U/L (ref 38–126)
Anion gap: 11 (ref 5–15)
BUN: 13 mg/dL (ref 8–23)
CO2: 22 mmol/L (ref 22–32)
Calcium: 9.2 mg/dL (ref 8.9–10.3)
Chloride: 105 mmol/L (ref 98–111)
Creatinine, Ser: 0.98 mg/dL (ref 0.61–1.24)
GFR, Estimated: >60 mL/min (ref >60)
Glucose, Bld: 177 mg/dL — ABNORMAL HIGH (ref 70–99)
Potassium: 4.6 mmol/L (ref 3.5–5.1)
Sodium: 138 mmol/L (ref 135–145)
Total Bilirubin: 0.2 mg/dL (ref <1.2)
Total Protein: 6.4 g/dL — ABNORMAL LOW (ref 6.5–8.1)

## 2023-05-16 LAB — CBG MONITORING, ED: Glucose-Capillary: 188 mg/dL — ABNORMAL HIGH (ref 70–99)

## 2023-05-16 LAB — MAGNESIUM: Magnesium: 1.7 mg/dL (ref 1.7–2.4)

## 2023-05-16 MED ORDER — DILTIAZEM LOAD VIA INFUSION
15.0000 mg | Freq: Once | INTRAVENOUS | Status: DC
  Filled 2023-05-16: qty 15

## 2023-05-16 MED ORDER — DILTIAZEM HCL-DEXTROSE 125-5 MG/125ML-% IV SOLN (PREMIX)
5.0000 mg/h | INTRAVENOUS | Status: DC
Start: 2023-05-16 — End: 2023-05-17

## 2023-05-16 MED ORDER — ADENOSINE 6 MG/2ML IV SOLN
INTRAVENOUS | Status: AC
  Administered 2023-05-16: 6 mg
  Filled 2023-05-16: qty 4

## 2023-05-16 NOTE — ED Notes (Signed)
Pt rolled in a wheelchair to room 17 with Clare Gandy PA-C

## 2023-05-16 NOTE — ED Triage Notes (Signed)
Pt arrived from home via POV c/o chest pain 5/10 dull ache left chest. Pt noted to have pulse of 173 in triage.

## 2023-05-16 NOTE — ED Provider Notes (Signed)
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  About an hour prior to arrival, patient started states that he felt his heart beating really fast.  He took 324 mg of aspirin just in case and came in here for evaluation.    ROS Negative except as per HPI   Medical Decision Making   Upon presentation, the patient tachycardic to the 170s, although he is otherwise hemodynamically stable from a blood pressure standpoint.  Patient was immediately placed in a room, EKG was obtained, IV access was obtained, and patient was hooked up to a cardiac monitor and pads  EKG looks possible for SVT, there may be an underlying flutter wave.  We attempted to vagal maneuvers per the revert trial, which were unsuccessful at all and change in his heart rate.  Ultimately we attempted 6 mg of adenosine, which did briefly so the heart rate down, may be some underlying flutter waves and for this reason we chose not to attempt any more adenosine.  We obtained labs on this individual, BNP 9 9, magnesium 1.7, his hemoglobin is 12.9, he has no major metabolic derangement his glucose check upfront was 188.  His chest x-ray shows cardiomegaly with vascular congestion.  I reviewed his last echo, ejection fraction around 45 to 50%, which puts him in mild ejection fraction reduced range, which would make him a candidate for beta-blocker or calcium channel blocker therapy.  We went to put in diltiazem, however the patient spontaneously converted out of his rhythm.  We are now still pending a thyroid and T4 level, and my attending will determine ultimate disposition. Pt does have dilt prn at home.   Patient was actually just seen here a few days ago for similar episode, and this is a new overall process for him.  He has cardiology  follow-up pending     No diagnosis found.  @DISPOSITION @  Rx / DC Orders ED Discharge Orders     None        Past Medical History:  Diagnosis Date   Chronic combined systolic and diastolic CHF (congestive heart failure) (HCC)    Coronary atherosclerosis of native coronary artery    Dental crowns present    Essential hypertension, benign    states under control with meds., has been on med. x 15 yr.   GERD (gastroesophageal reflux disease)    Hepatitis    Drug induced hepatitis   MI (myocardial infarction) (HCC) 10/27/2002   Non-insulin dependent type 2 diabetes mellitus (HCC)    Obesity    Osteoarthritis    hips and knees   Pure hypercholesterolemia    Sleep apnea    Past Surgical History:  Procedure Laterality Date   ANKLE SURGERY Left    as a child   ANTERIOR CERVICAL DECOMP/DISCECTOMY FUSION Left 11/09/2021   Procedure: ANTERIOR CERVICAL DECOMPRESSION/DISCECTOMY FUSION 3 LEVELS C3-C6;  Surgeon: Venita Lick, MD;  Location: [facility];  Service: Orthopedics;  Laterality: Left;  4 hrs 3 C-Bed   ANTERIOR INTEROSSEOUS NERVE DECOMPRESSION Right 04/15/2021   Procedure: ANTERIOR INTEROSSEOUS NERVE DECOMPRESSION;  Surgeon: Dairl Ponder, MD;  Location: [facility];  Service: Orthopedics;  Laterality: Right;  Only need 1 hour for case,  Axillary block/Mac   CARDIAC CATHETERIZATION  10/27/2002   CARPAL TUNNEL RELEASE  Right 11/21/2013   CARPAL TUNNEL RELEASE Right 04/15/2021   Procedure: CARPAL TUNNEL RELEASE;  Surgeon: Dairl Ponder, MD;  Location: [facility];  Service: Orthopedics;  Laterality: Right;   COLONOSCOPY WITH PROPOFOL N/A 04/29/2023   Procedure: COLONOSCOPY WITH PROPOFOL;  Surgeon: Jeani Hawking, MD;  Location: [facility];  Service: Gastroenterology;  Laterality: N/A;   CORONARY STENT PLACEMENT  10/27/2002   mid LAD   TOTAL HIP ARTHROPLASTY Left 04/09/1999   TOTAL HIP ARTHROPLASTY Right    TOTAL KNEE ARTHROPLASTY Left  02/03/2011   TOTAL KNEE ARTHROPLASTY Right 05/11/2021   Procedure: TOTAL KNEE ARTHROPLASTY;  Surgeon: Ollen Gross, MD;  Location: [facility];  Service: Orthopedics;  Laterality: Right;   ULNAR TUNNEL RELEASE Left 06/18/2016   Procedure: LEFT CUBITAL TUNNEL RELEASE;  Surgeon: Dairl Ponder, MD;  Location: [facility];  Service: Orthopedics;  Laterality: Left;   Family History  Problem Relation Age of Onset   Healthy Mother    Healthy Father    Social History   Socioeconomic History   Marital status: Married    Spouse name: Not on file   Number of children: Not on file   Years of education: Not on file   Highest education level: Not on file  Occupational History   Not on file  Tobacco Use   Smoking status: Never   Smokeless tobacco: Never  Vaping Use   Vaping status: Never Used  Substance and Sexual Activity   Alcohol use: Yes    Alcohol/week: 0.0 standard drinks of alcohol    Comment: occasionally   Drug use: No   Sexual activity: Not on file  Other Topics Concern   Not on file  Social History Narrative   Not on file   Social Drivers of Health   Financial Resource Strain: Not on file  Food Insecurity: No Food Insecurity (11/21/2022)   Received from [facility]   Hunger Vital Sign    Worried About Running Out of Food in the Last Year: Never true    Ran Out of Food in the Last Year: Never true  Transportation Needs: No Transportation Needs (11/21/2022)   Received from [facility] - Transportation    Lack of Transportation (Medical): No    Lack of Transportation (Non-Medical): No  Physical Activity: Not on file  Stress: No Stress Concern Present (11/21/2022)   Received from [facility] - Occupational Stress Questionnaire    Feeling of Stress : Not at all  Social Connections: Unknown (11/04/2022)   Received from [facility]   Social Network    Social Network: Not on file   Intimate Partner Violence: Not At Risk (11/21/2022)   Received from [facility]   HITS    Over the last 12 months how often did your partner physically hurt you?: Never    Over the last 12 months how often did your partner insult you or talk down to you?: Never    Over the last 12 months how often did your partner threaten you with physical harm?: Never    Over the last 12 months how often did your partner scream or curse at you?: Never     Physical Exam   Vitals:   05/16/23 2059 05/16/23 2101 05/16/23 2105 05/16/23 2106  BP: (!) 130/113 122/85    Pulse: (!) 173 (!) 173    Resp: 20     Temp: 98.4 F (36.9 C)  SpO2: 100% 100% 100%   Weight:    88 kg  Height:    5\' 5"  (1.651 m)    Physical Exam Vitals and nursing note reviewed.  Constitutional:      General: He is not in acute distress.    Appearance: Normal appearance. He is well-developed. He is not ill-appearing or toxic-appearing.  HENT:     Head: Normocephalic and atraumatic.     Right Ear: External ear normal.     Left Ear: External ear normal.     Nose: Nose normal.     Mouth/Throat:     Mouth: Mucous membranes are moist.  Eyes:     Extraocular Movements: Extraocular movements intact.     Pupils: Pupils are equal, round, and reactive to light.  Cardiovascular:     Rate and Rhythm: Tachycardia present.     Pulses: Normal pulses.  Pulmonary:     Effort: Pulmonary effort is normal. No respiratory distress.     Breath sounds: Normal breath sounds. No stridor. No wheezing, rhonchi or rales.  Abdominal:     Palpations: Abdomen is soft.     Tenderness: There is no abdominal tenderness. There is no right CVA tenderness or left CVA tenderness.  Musculoskeletal:        General: Normal range of motion.     Cervical back: Normal range of motion and neck supple.     Comments: Patient has bilateral lower extremity edema  Skin:    General: Skin is warm and dry.     Capillary Refill: Capillary refill takes less than 2  seconds.  Neurological:     General: No focal deficit present.     Mental Status: He is alert and oriented to person, place, and time. Mental status is at baseline.  Psychiatric:        Mood and Affect: Mood normal.      Procedures   If procedures were preformed on this patient, they are listed below:  Procedures  The patient was seen, evaluated, and treated in conjunction with the attending physician, who voiced agreement in the care provided.  Note generated using Dragon voice dictation software and may contain dictation errors. Please contact me for any clarification or with any questions.   Electronically signed by:  Osvaldo Shipper, M.D. (PGY-2)    Gunnar Bulla, MD 05/17/23 Jorje Guild    Alvira Monday, MD 05/17/23 1052

## 2023-05-16 NOTE — ED Provider Notes (Incomplete)
Past Medical History:  Diagnosis Date   Chronic combined systolic and diastolic CHF (congestive heart failure) (HCC)    Coronary atherosclerosis of native coronary artery    Dental crowns present    Essential hypertension, benign    states under control with meds., has been on med. x 15 yr.   GERD (gastroesophageal reflux disease)    Hepatitis    Drug induced hepatitis   MI (myocardial infarction) (HCC) 10/27/2002   Non-insulin dependent type 2 diabetes mellitus (HCC)    Obesity    Osteoarthritis    hips and knees   Pure hypercholesterolemia    Sleep apnea

## 2023-05-17 ENCOUNTER — Telehealth: Payer: Self-pay | Admitting: Cardiovascular Disease

## 2023-05-17 ENCOUNTER — Other Ambulatory Visit: Payer: Self-pay

## 2023-05-17 ENCOUNTER — Other Ambulatory Visit (HOSPITAL_COMMUNITY): Payer: Self-pay

## 2023-05-17 DIAGNOSIS — T8189XD Other complications of procedures, not elsewhere classified, subsequent encounter: Secondary | ICD-10-CM | POA: Diagnosis not present

## 2023-05-17 DIAGNOSIS — I2510 Atherosclerotic heart disease of native coronary artery without angina pectoris: Secondary | ICD-10-CM | POA: Diagnosis not present

## 2023-05-17 DIAGNOSIS — I10 Essential (primary) hypertension: Secondary | ICD-10-CM

## 2023-05-17 DIAGNOSIS — G959 Disease of spinal cord, unspecified: Secondary | ICD-10-CM | POA: Diagnosis not present

## 2023-05-17 DIAGNOSIS — I252 Old myocardial infarction: Secondary | ICD-10-CM | POA: Diagnosis not present

## 2023-05-17 DIAGNOSIS — E1159 Type 2 diabetes mellitus with other circulatory complications: Secondary | ICD-10-CM | POA: Diagnosis not present

## 2023-05-17 DIAGNOSIS — R000 Tachycardia, unspecified: Secondary | ICD-10-CM | POA: Diagnosis not present

## 2023-05-17 DIAGNOSIS — M4802 Spinal stenosis, cervical region: Secondary | ICD-10-CM | POA: Diagnosis not present

## 2023-05-17 LAB — TSH: TSH: 1.451 u[IU]/mL (ref 0.350–4.500)

## 2023-05-17 LAB — BRAIN NATRIURETIC PEPTIDE: B Natriuretic Peptide: 99.6 pg/mL (ref 0.0–100.0)

## 2023-05-17 LAB — T4, FREE: Free T4: 1.03 ng/dL (ref 0.61–1.12)

## 2023-05-17 MED ORDER — DILTIAZEM HCL ER COATED BEADS 120 MG PO CP24
120.0000 mg | ORAL_CAPSULE | Freq: Every day | ORAL | 1 refills | Status: DC
  Filled 2023-05-17: qty 90, 90d supply, fill #0

## 2023-05-17 MED ORDER — RAMIPRIL 10 MG PO CAPS
10.0000 mg | ORAL_CAPSULE | Freq: Every day | ORAL | 1 refills | Status: DC
  Filled 2023-05-17: qty 90, 90d supply, fill #0

## 2023-05-17 NOTE — Telephone Encounter (Signed)
Called and spoke to patient regarding new med rec's per NP. He verbalized understanding. New meds to be sent to [facility]. Any and all questions were answered.    Present regimen Coreg 25mg  BID and PRN Diltiazem.    Given recurrent SVT, would recommend initiation of Diltiazem ER 120mg  daily. Reduce Ramipril from BID to daily to prevent hypotension.  Continue Coreg 25mg  BID, Lasix 40mg  daily, Triamterene-hydrochlorothiazide daily as per med list.    If he will call in one week with log of BP/HR. Follow up as scheduled with Dr. Duke Salvia 06/05/22.   Alver Sorrow, NP

## 2023-05-17 NOTE — Telephone Encounter (Signed)
ED visit 05/17/23 SVT with ?underlying flutter wave. Given 6mg  adenosine. From ED note spontaneously converted, do not see mention of cardioversion TSH/T4 were normal. Similar episode 05/13/23 with SVT converted with adenosine 6mg  by EMS.   Present regimen Coreg 25mg  BID and PRN Diltiazem.   Given recurrent SVT, would recommend initiation of Diltiazem ER 120mg  daily. Reduce Ramipril from BID to daily to prevent hypotension.  Continue Coreg 25mg  BID, Lasix 40mg  daily, Triamterene-hydrochlorothiazide daily as per med list.   If he will call in one week with log of BP/HR. Follow up as scheduled with Dr. Duke Salvia 06/05/22.  Ryan Sorrow, NP

## 2023-05-17 NOTE — Telephone Encounter (Signed)
Returned call and spoke to patient; had a recent cardioversion yesterday. He said he feels fine, no issues at this time. Just wanted to make sure MD was made aware and see if he needed to be seen sooner or needed to make any medication changes.   Vitals today with HH nurse  BP: 120/82  P: 93-96 Wgt: 197.4 lbs.   Pt made aware that Dr. Duke Salvia is currently out of office and will return after Christmas. He verbally understands. As of right now, he has no s/sx.   Will call back if any new recommendations arise.

## 2023-05-17 NOTE — Telephone Encounter (Signed)
Pt was at the ER last night and had a cardioversion. They told him to call out office and make Doctor aware.

## 2023-05-20 DIAGNOSIS — M4802 Spinal stenosis, cervical region: Secondary | ICD-10-CM | POA: Diagnosis not present

## 2023-05-20 DIAGNOSIS — G959 Disease of spinal cord, unspecified: Secondary | ICD-10-CM | POA: Diagnosis not present

## 2023-05-20 DIAGNOSIS — I2510 Atherosclerotic heart disease of native coronary artery without angina pectoris: Secondary | ICD-10-CM | POA: Diagnosis not present

## 2023-05-20 DIAGNOSIS — I252 Old myocardial infarction: Secondary | ICD-10-CM | POA: Diagnosis not present

## 2023-05-20 DIAGNOSIS — E1159 Type 2 diabetes mellitus with other circulatory complications: Secondary | ICD-10-CM | POA: Diagnosis not present

## 2023-05-20 DIAGNOSIS — T8189XD Other complications of procedures, not elsewhere classified, subsequent encounter: Secondary | ICD-10-CM | POA: Diagnosis not present

## 2023-05-23 DIAGNOSIS — T8131XD Disruption of external operation (surgical) wound, not elsewhere classified, subsequent encounter: Secondary | ICD-10-CM | POA: Diagnosis not present

## 2023-05-23 DIAGNOSIS — E785 Hyperlipidemia, unspecified: Secondary | ICD-10-CM | POA: Diagnosis not present

## 2023-05-23 DIAGNOSIS — I479 Paroxysmal tachycardia, unspecified: Secondary | ICD-10-CM | POA: Diagnosis not present

## 2023-05-23 DIAGNOSIS — D759 Disease of blood and blood-forming organs, unspecified: Secondary | ICD-10-CM | POA: Diagnosis not present

## 2023-05-23 DIAGNOSIS — I252 Old myocardial infarction: Secondary | ICD-10-CM | POA: Diagnosis not present

## 2023-05-23 DIAGNOSIS — Z6834 Body mass index (BMI) 34.0-34.9, adult: Secondary | ICD-10-CM | POA: Diagnosis not present

## 2023-05-23 DIAGNOSIS — G4733 Obstructive sleep apnea (adult) (pediatric): Secondary | ICD-10-CM | POA: Diagnosis not present

## 2023-05-23 DIAGNOSIS — N401 Enlarged prostate with lower urinary tract symptoms: Secondary | ICD-10-CM | POA: Diagnosis not present

## 2023-05-23 DIAGNOSIS — Z951 Presence of aortocoronary bypass graft: Secondary | ICD-10-CM | POA: Diagnosis not present

## 2023-05-23 DIAGNOSIS — Z981 Arthrodesis status: Secondary | ICD-10-CM | POA: Diagnosis not present

## 2023-05-23 DIAGNOSIS — Z791 Long term (current) use of non-steroidal anti-inflammatories (NSAID): Secondary | ICD-10-CM | POA: Diagnosis not present

## 2023-05-23 DIAGNOSIS — R350 Frequency of micturition: Secondary | ICD-10-CM | POA: Diagnosis not present

## 2023-05-23 DIAGNOSIS — D696 Thrombocytopenia, unspecified: Secondary | ICD-10-CM | POA: Diagnosis not present

## 2023-05-23 DIAGNOSIS — M130 Polyarthritis, unspecified: Secondary | ICD-10-CM | POA: Diagnosis not present

## 2023-05-23 DIAGNOSIS — I152 Hypertension secondary to endocrine disorders: Secondary | ICD-10-CM | POA: Diagnosis not present

## 2023-05-23 DIAGNOSIS — Z7985 Long-term (current) use of injectable non-insulin antidiabetic drugs: Secondary | ICD-10-CM | POA: Diagnosis not present

## 2023-05-23 DIAGNOSIS — I5042 Chronic combined systolic (congestive) and diastolic (congestive) heart failure: Secondary | ICD-10-CM | POA: Diagnosis not present

## 2023-05-23 DIAGNOSIS — E1159 Type 2 diabetes mellitus with other circulatory complications: Secondary | ICD-10-CM | POA: Diagnosis not present

## 2023-05-23 DIAGNOSIS — H547 Unspecified visual loss: Secondary | ICD-10-CM | POA: Diagnosis not present

## 2023-05-23 DIAGNOSIS — Z7984 Long term (current) use of oral hypoglycemic drugs: Secondary | ICD-10-CM | POA: Diagnosis not present

## 2023-05-23 DIAGNOSIS — M4802 Spinal stenosis, cervical region: Secondary | ICD-10-CM | POA: Diagnosis not present

## 2023-05-23 DIAGNOSIS — R351 Nocturia: Secondary | ICD-10-CM | POA: Diagnosis not present

## 2023-05-23 DIAGNOSIS — I2510 Atherosclerotic heart disease of native coronary artery without angina pectoris: Secondary | ICD-10-CM | POA: Diagnosis not present

## 2023-05-23 DIAGNOSIS — Z7902 Long term (current) use of antithrombotics/antiplatelets: Secondary | ICD-10-CM | POA: Diagnosis not present

## 2023-05-24 DIAGNOSIS — T8131XD Disruption of external operation (surgical) wound, not elsewhere classified, subsequent encounter: Secondary | ICD-10-CM | POA: Diagnosis not present

## 2023-05-24 DIAGNOSIS — E1159 Type 2 diabetes mellitus with other circulatory complications: Secondary | ICD-10-CM | POA: Diagnosis not present

## 2023-05-24 DIAGNOSIS — I2510 Atherosclerotic heart disease of native coronary artery without angina pectoris: Secondary | ICD-10-CM | POA: Diagnosis not present

## 2023-05-24 DIAGNOSIS — I152 Hypertension secondary to endocrine disorders: Secondary | ICD-10-CM | POA: Diagnosis not present

## 2023-05-24 DIAGNOSIS — M4802 Spinal stenosis, cervical region: Secondary | ICD-10-CM | POA: Diagnosis not present

## 2023-05-24 DIAGNOSIS — I5042 Chronic combined systolic (congestive) and diastolic (congestive) heart failure: Secondary | ICD-10-CM | POA: Diagnosis not present

## 2023-05-27 DIAGNOSIS — I2510 Atherosclerotic heart disease of native coronary artery without angina pectoris: Secondary | ICD-10-CM | POA: Diagnosis not present

## 2023-05-27 DIAGNOSIS — M4802 Spinal stenosis, cervical region: Secondary | ICD-10-CM | POA: Diagnosis not present

## 2023-05-27 DIAGNOSIS — T8131XD Disruption of external operation (surgical) wound, not elsewhere classified, subsequent encounter: Secondary | ICD-10-CM | POA: Diagnosis not present

## 2023-05-27 DIAGNOSIS — E1159 Type 2 diabetes mellitus with other circulatory complications: Secondary | ICD-10-CM | POA: Diagnosis not present

## 2023-05-27 DIAGNOSIS — I152 Hypertension secondary to endocrine disorders: Secondary | ICD-10-CM | POA: Diagnosis not present

## 2023-05-27 DIAGNOSIS — I5042 Chronic combined systolic (congestive) and diastolic (congestive) heart failure: Secondary | ICD-10-CM | POA: Diagnosis not present

## 2023-05-30 ENCOUNTER — Other Ambulatory Visit: Payer: Self-pay

## 2023-05-30 ENCOUNTER — Other Ambulatory Visit (HOSPITAL_BASED_OUTPATIENT_CLINIC_OR_DEPARTMENT_OTHER): Payer: Self-pay | Admitting: Cardiovascular Disease

## 2023-05-30 ENCOUNTER — Other Ambulatory Visit (HOSPITAL_COMMUNITY): Payer: Self-pay

## 2023-05-30 DIAGNOSIS — I152 Hypertension secondary to endocrine disorders: Secondary | ICD-10-CM | POA: Diagnosis not present

## 2023-05-30 DIAGNOSIS — I5042 Chronic combined systolic (congestive) and diastolic (congestive) heart failure: Secondary | ICD-10-CM | POA: Diagnosis not present

## 2023-05-30 DIAGNOSIS — M4802 Spinal stenosis, cervical region: Secondary | ICD-10-CM | POA: Diagnosis not present

## 2023-05-30 DIAGNOSIS — T8131XD Disruption of external operation (surgical) wound, not elsewhere classified, subsequent encounter: Secondary | ICD-10-CM | POA: Diagnosis not present

## 2023-05-30 DIAGNOSIS — E1159 Type 2 diabetes mellitus with other circulatory complications: Secondary | ICD-10-CM | POA: Diagnosis not present

## 2023-05-30 DIAGNOSIS — I2510 Atherosclerotic heart disease of native coronary artery without angina pectoris: Secondary | ICD-10-CM | POA: Diagnosis not present

## 2023-05-31 ENCOUNTER — Other Ambulatory Visit: Payer: Self-pay

## 2023-05-31 ENCOUNTER — Other Ambulatory Visit (HOSPITAL_COMMUNITY): Payer: Self-pay

## 2023-05-31 MED ORDER — RAMIPRIL 10 MG PO CAPS: 10.0000 mg | ORAL_CAPSULE | Freq: Two times a day (BID) | ORAL | 3 refills | Status: DC

## 2023-06-02 DIAGNOSIS — M4802 Spinal stenosis, cervical region: Secondary | ICD-10-CM | POA: Diagnosis not present

## 2023-06-02 DIAGNOSIS — I5042 Chronic combined systolic (congestive) and diastolic (congestive) heart failure: Secondary | ICD-10-CM | POA: Diagnosis not present

## 2023-06-02 DIAGNOSIS — T8131XD Disruption of external operation (surgical) wound, not elsewhere classified, subsequent encounter: Secondary | ICD-10-CM | POA: Diagnosis not present

## 2023-06-02 DIAGNOSIS — E1159 Type 2 diabetes mellitus with other circulatory complications: Secondary | ICD-10-CM | POA: Diagnosis not present

## 2023-06-02 DIAGNOSIS — I2510 Atherosclerotic heart disease of native coronary artery without angina pectoris: Secondary | ICD-10-CM | POA: Diagnosis not present

## 2023-06-02 DIAGNOSIS — I152 Hypertension secondary to endocrine disorders: Secondary | ICD-10-CM | POA: Diagnosis not present

## 2023-06-03 ENCOUNTER — Ambulatory Visit: Payer: Self-pay | Admitting: Cardiovascular Disease

## 2023-06-06 ENCOUNTER — Encounter (HOSPITAL_BASED_OUTPATIENT_CLINIC_OR_DEPARTMENT_OTHER): Payer: Self-pay | Admitting: Cardiovascular Disease

## 2023-06-06 ENCOUNTER — Other Ambulatory Visit (HOSPITAL_COMMUNITY): Payer: Self-pay

## 2023-06-06 ENCOUNTER — Ambulatory Visit (INDEPENDENT_AMBULATORY_CARE_PROVIDER_SITE_OTHER): Payer: Medicare Other | Admitting: Cardiovascular Disease

## 2023-06-06 ENCOUNTER — Other Ambulatory Visit: Payer: Self-pay

## 2023-06-06 ENCOUNTER — Telehealth: Payer: Self-pay | Admitting: Cardiovascular Disease

## 2023-06-06 VITALS — BP 132/64 | HR 102 | Ht 64.0 in | Wt 200.0 lb

## 2023-06-06 DIAGNOSIS — E7849 Other hyperlipidemia: Secondary | ICD-10-CM | POA: Diagnosis not present

## 2023-06-06 DIAGNOSIS — Z5181 Encounter for therapeutic drug level monitoring: Secondary | ICD-10-CM | POA: Diagnosis not present

## 2023-06-06 DIAGNOSIS — I252 Old myocardial infarction: Secondary | ICD-10-CM | POA: Diagnosis not present

## 2023-06-06 DIAGNOSIS — I152 Hypertension secondary to endocrine disorders: Secondary | ICD-10-CM | POA: Diagnosis not present

## 2023-06-06 DIAGNOSIS — I5042 Chronic combined systolic (congestive) and diastolic (congestive) heart failure: Secondary | ICD-10-CM

## 2023-06-06 DIAGNOSIS — I2510 Atherosclerotic heart disease of native coronary artery without angina pectoris: Secondary | ICD-10-CM | POA: Diagnosis not present

## 2023-06-06 DIAGNOSIS — T8131XD Disruption of external operation (surgical) wound, not elsewhere classified, subsequent encounter: Secondary | ICD-10-CM | POA: Diagnosis not present

## 2023-06-06 DIAGNOSIS — M130 Polyarthritis, unspecified: Secondary | ICD-10-CM | POA: Diagnosis not present

## 2023-06-06 DIAGNOSIS — I7121 Aneurysm of the ascending aorta, without rupture: Secondary | ICD-10-CM | POA: Diagnosis not present

## 2023-06-06 DIAGNOSIS — E1159 Type 2 diabetes mellitus with other circulatory complications: Secondary | ICD-10-CM | POA: Diagnosis not present

## 2023-06-06 DIAGNOSIS — D759 Disease of blood and blood-forming organs, unspecified: Secondary | ICD-10-CM | POA: Diagnosis not present

## 2023-06-06 DIAGNOSIS — I4719 Other supraventricular tachycardia: Secondary | ICD-10-CM

## 2023-06-06 DIAGNOSIS — I479 Paroxysmal tachycardia, unspecified: Secondary | ICD-10-CM | POA: Diagnosis not present

## 2023-06-06 DIAGNOSIS — D696 Thrombocytopenia, unspecified: Secondary | ICD-10-CM | POA: Diagnosis not present

## 2023-06-06 DIAGNOSIS — M4802 Spinal stenosis, cervical region: Secondary | ICD-10-CM | POA: Diagnosis not present

## 2023-06-06 DIAGNOSIS — I10 Essential (primary) hypertension: Secondary | ICD-10-CM

## 2023-06-06 DIAGNOSIS — G4733 Obstructive sleep apnea (adult) (pediatric): Secondary | ICD-10-CM | POA: Diagnosis not present

## 2023-06-06 HISTORY — DX: Aneurysm of the ascending aorta, without rupture: I71.21

## 2023-06-06 MED ORDER — DAPAGLIFLOZIN PROPANEDIOL 10 MG PO TABS: 10.0000 mg | ORAL_TABLET | Freq: Every day | ORAL | Status: AC

## 2023-06-06 MED ORDER — ENTRESTO 49-51 MG PO TABS
1.0000 | ORAL_TABLET | Freq: Two times a day (BID) | ORAL | 1 refills | Status: DC
  Filled 2023-06-06: qty 180, 90d supply, fill #0

## 2023-06-06 MED ORDER — METOPROLOL SUCCINATE ER 100 MG PO TB24
100.0000 mg | ORAL_TABLET | Freq: Every day | ORAL | 3 refills | Status: DC
  Filled 2023-06-06: qty 90, 90d supply, fill #0

## 2023-06-06 NOTE — Telephone Encounter (Signed)
 Error

## 2023-06-06 NOTE — Progress Notes (Signed)
 Cardiology Office Note:  .   Date:  06/06/2023  ID:  Ryan DELENA Centers, MD, DOB 10-Oct-1950, MRN [id number] PCP: Ryan Elsie JONETTA Mickey., MD  Ryan Walsh Cardiologist:  Ryan LELON Claudene DOUGLAS, MD (Inactive)    History of Present Illness: .   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  He was previously a patient of Dr. Claudene.  He was last seen 01/2022 and was doing well at that time.  He has a history of prior anterior MI treated with PCI.  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  He notes chronic LE edema that is worse than usual at this time.  He denies orthopnea or PND.  He uses compression stockings daily and rarely takes extra furosemide .   He was seen in the ED 04/2023 with an episode of SVT.  He is unclear what precipitated this event as he was taking his carvedilol  as prescribed.  He denied any recent stressors, heavy alcohol , or caffeine intake.  His heart rate was reportedly in the 160s to 170s.  He was given adenosine  and later spontensously convered. Daily diltiazem  was added to his regimen and ramipril  was discontinued.  He denies any recurrent episodes since that time.  Dr. Centers reports that his biggest struggle has been recovering from cervical disc fusion.  The most recent surgery in June of the previous year resulted in significant debilitation, including an inability to walk or use his arms for self-care.  He required extensive inpatient physical therapy and is still in the recovery phase, experiencing residual numbness in his hands and feet.  He plans to start physical therapy here at [facility] next week.  [name] blood pressure is generally well-controlled, with readings around 120-130/70-80. However, he has a persistently elevated resting heart rate of 95-105 bpm. He has been diligent in monitoring his blood pressure  and pulse at home.  ROS:  As per HPI  Studies Reviewed: .       Echo 03/28/20:  1. Left ventricular ejection fraction, by estimation, is 45 to 50%. The  left ventricle has mildly reduced function. The left ventricle has no  regional wall motion abnormalities. There is mild concentric hypertrophy  with focal moderate basal-septal  hypertrophy.Left ventricular diastolic parameters are consistent with  Grade I diastolic dysfunction (impaired relaxation).   2. Right ventricular systolic function is normal. The right ventricular  size is normal.   3. The mitral valve is normal in structure. Trivial mitral valve  regurgitation.   4. The aortic valve is tricuspid. There is mild calcification of the  aortic valve. There is mild thickening of the aortic valve. Aortic valve  regurgitation is not visualized.   5. Aortic dilatation noted. There is mild dilatation of the aortic root,  measuring 41 mm. There is mild to moderate dilatation of the ascending  aorta, measuring 41 mm.   6. The inferior vena cava is normal in size with <50% respiratory  variability, suggesting right atrial pressure of 8 mmHg.   Risk Assessment/Calculations:             Physical Exam:   VS:  BP 132/64 (BP Location: Left Arm, Patient Position: Sitting, Cuff Size: Normal)   Pulse (!) 102   Ht 5' 4 (1.626 m)   Wt 200 lb (90.7 kg)   SpO2 98%   BMI  34.33 kg/m  , BMI Body mass index is 34.33 kg/m. GENERAL:  Well appearing HEENT: Pupils equal round and reactive, fundi not visualized, oral mucosa unremarkable NECK:  No jugular venous distention, waveform within normal limits, carotid upstroke brisk and symmetric, no bruits, no thyromegaly LUNGS:  Clear to auscultation bilaterally HEART:  RRR.  PMI not displaced or sustained,S1 and S2 within normal limits, no S3, no S4, no clicks, no rubs, no murmurs ABD:  Flat, positive bowel sounds normal in frequency in pitch, no bruits, no rebound, no guarding, no midline  pulsatile mass, no hepatomegaly, no splenomegaly EXT:  2 plus pulses throughout, 2+ LE edema, no cyanosis no clubbing SKIN:  No rashes no nodules NEURO:  Cranial nerves II through XII grossly intact, motor grossly intact throughout PSYCH:  Cognitively intact, oriented to person place and time   ASSESSMENT AND PLAN: .      # Supraventricular Tachycardia (SVT) Recent episodes of SVT with associated mild chest discomfort. Episodes self-resolved or resolved. No clear precipitating factors identified. -Switch from Carvedilol  to Metoprolol  Succinate 100mg  daily to better control heart rate and potentially prevent further SVT episodes.  # Heart Failure with Moderately Reduced Ejection Fraction (HFmrEF) Stable with mildly reduced ejection fraction (EF 45%). Currently on Carvedilol  and Ramipril . -Discontinue Carvedilol  and Ramipril . -Start Metoprolol  Succinate 100mg  daily and Entresto  49/51mg  twice daily. -Continue Farxiga  -Check BMP 1 week after starting Entresto .  # Mild Aortic Dilation Stable mild aortic dilation (41mm). -Ensure good blood pressure control to prevent further dilation. -Repeat echocardiogram in 3 months to assess stability.  # Follow-up -Check blood pressure regularly at home and report any significant changes. -Return for follow-up appointment after echocardiogram results are available.        Dispo: f/u 3 months after echo  Signed, Ryan Scarce, MD

## 2023-06-06 NOTE — Patient Instructions (Signed)
 Medication Instructions:  STOP RAMIPRIL    WEDNESDAY START ENTRESTO  49-51 MG TWICE A DAY   STOP CARVEDILOL    START METOPROLOL  100 MG DAILY   *If you need a refill on your cardiac medications before your next appointment, please call your pharmacy*  Lab Work: BMET 1 WEEK AFTER STARTING ENTRESTO    If you have labs (blood work) drawn today and your tests are completely normal, you will receive your results only by: MyChart Message (if you have MyChart) OR A paper copy in the mail If you have any lab test that is abnormal or we need to change your treatment, we will call you to review the results.  Testing/Procedures: Your physician has requested that you have an echocardiogram. Echocardiography is a painless test that uses sound waves to create images of your heart. It provides your doctor with information about the size and shape of your heart and how well your heart's chambers and valves are working. This procedure takes approximately one hour. There are no restrictions for this procedure. Please do NOT wear cologne, perfume, aftershave, or lotions (deodorant is allowed). Please arrive 15 minutes prior to your appointment time.  Please note: We ask at that you not bring children with you during ultrasound (echo/ vascular) testing. Due to room size and safety concerns, children are not allowed in the ultrasound rooms during exams. Our front office staff cannot provide observation of children in our lobby area while testing is being conducted. An adult accompanying a patient to their appointment will only be allowed in the ultrasound room at the discretion of the ultrasound technician under special circumstances. We apologize for any inconvenience.  TO BE DONE IN 3 MONTHS ABOUT 1 WEEK PRIOR TO FOLLOW UP  Follow-Up: At [facility], you and your health needs are our priority.  As part of our continuing mission to provide you with exceptional heart care, we have created designated  Provider Care Teams.  These Care Teams include your primary Cardiologist (physician) and Advanced Practice Providers (APPs -  Physician Assistants and Nurse Practitioners) who all work together to provide you with the care you need, when you need it.  We recommend signing up for the patient portal called MyChart.  Sign up information is provided on this After Visit Summary.  MyChart is used to connect with patients for Virtual Visits (Telemedicine).  Patients are able to view lab/test results, encounter notes, upcoming appointments, etc.  Non-urgent messages can be sent to your provider as well.   To learn more about what you can do with MyChart, go to [email or web address].    Your next appointment:   3 month(s)  Provider:   Annabella Scarce, MD    Other Instructions

## 2023-06-07 ENCOUNTER — Other Ambulatory Visit: Payer: Self-pay

## 2023-06-07 ENCOUNTER — Telehealth (HOSPITAL_BASED_OUTPATIENT_CLINIC_OR_DEPARTMENT_OTHER): Payer: Self-pay | Admitting: Cardiovascular Disease

## 2023-06-07 ENCOUNTER — Other Ambulatory Visit (HOSPITAL_COMMUNITY): Payer: Self-pay

## 2023-06-07 DIAGNOSIS — I10 Essential (primary) hypertension: Secondary | ICD-10-CM

## 2023-06-07 MED ORDER — CLOPIDOGREL BISULFATE 75 MG PO TABS: 75.0000 mg | ORAL_TABLET | Freq: Every morning | ORAL | 3 refills | Status: DC

## 2023-06-07 MED ORDER — ENTRESTO 49-51 MG PO TABS: 1.0000 | ORAL_TABLET | Freq: Two times a day (BID) | ORAL | 3 refills | Status: DC

## 2023-06-07 MED ORDER — DILTIAZEM HCL ER COATED BEADS 120 MG PO CP24: 120.0000 mg | ORAL_CAPSULE | Freq: Every day | ORAL | 3 refills | Status: AC

## 2023-06-07 MED ORDER — ATORVASTATIN CALCIUM 40 MG PO TABS
40.0000 mg | ORAL_TABLET | Freq: Every day | ORAL | 3 refills | Status: DC
  Filled 2023-06-16 – 2023-06-30 (×2): qty 90, 90d supply, fill #0
  Filled 2023-08-25 – 2023-09-23 (×2): qty 90, 90d supply, fill #1
  Filled 2023-09-29 – 2023-12-20 (×2): qty 90, 90d supply, fill #2
  Filled 2024-01-14 – 2024-04-09 (×2): qty 90, 90d supply, fill #3

## 2023-06-07 MED ORDER — METOPROLOL SUCCINATE ER 100 MG PO TB24: 100.0000 mg | ORAL_TABLET | Freq: Every day | ORAL | 3 refills | Status: DC

## 2023-06-07 NOTE — Telephone Encounter (Signed)
 Pt c/o medication issue:  1. Name of Medication:   sacubitril -valsartan  (ENTRESTO ) 49-51 MG  metoprolol  succinate (TOPROL -XL) 100 MG 24 hr tablet  diltiazem  (CARDIZEM  CD) 120 MG 24 hr capsule  dapagliflozin  propanediol (FARXIGA ) 10 MG TABS tablet  atorvastatin  (LIPITOR) 40 MG tablet   2. How are you currently taking this medication (dosage and times per day)?   As prescribed  3. Are you having a reaction (difficulty breathing--STAT)?   4. What is your medication issue?   Patient stated he needs to switch these medications prescribed by Dr. Raford to [id number] - [place], [place] - [address] AT [facility] [address]  Patient wants a call back to confirm when prescriptions have been sent.  Patient also wants a list of his medications posted to his MyChart.

## 2023-06-07 NOTE — Telephone Encounter (Signed)
 Discussed medications with patient and refilled cardiac meds as requested

## 2023-06-08 ENCOUNTER — Other Ambulatory Visit (HOSPITAL_COMMUNITY): Payer: Self-pay

## 2023-06-08 ENCOUNTER — Other Ambulatory Visit: Payer: Self-pay

## 2023-06-09 ENCOUNTER — Other Ambulatory Visit (HOSPITAL_COMMUNITY): Payer: Self-pay

## 2023-06-09 DIAGNOSIS — I2510 Atherosclerotic heart disease of native coronary artery without angina pectoris: Secondary | ICD-10-CM | POA: Diagnosis not present

## 2023-06-09 DIAGNOSIS — I152 Hypertension secondary to endocrine disorders: Secondary | ICD-10-CM | POA: Diagnosis not present

## 2023-06-09 DIAGNOSIS — E1159 Type 2 diabetes mellitus with other circulatory complications: Secondary | ICD-10-CM | POA: Diagnosis not present

## 2023-06-09 DIAGNOSIS — I5042 Chronic combined systolic (congestive) and diastolic (congestive) heart failure: Secondary | ICD-10-CM | POA: Diagnosis not present

## 2023-06-09 DIAGNOSIS — T8131XD Disruption of external operation (surgical) wound, not elsewhere classified, subsequent encounter: Secondary | ICD-10-CM | POA: Diagnosis not present

## 2023-06-09 DIAGNOSIS — M4802 Spinal stenosis, cervical region: Secondary | ICD-10-CM | POA: Diagnosis not present

## 2023-06-15 ENCOUNTER — Other Ambulatory Visit (HOSPITAL_COMMUNITY): Payer: Self-pay

## 2023-06-15 DIAGNOSIS — T8131XD Disruption of external operation (surgical) wound, not elsewhere classified, subsequent encounter: Secondary | ICD-10-CM | POA: Diagnosis not present

## 2023-06-15 DIAGNOSIS — E1159 Type 2 diabetes mellitus with other circulatory complications: Secondary | ICD-10-CM | POA: Diagnosis not present

## 2023-06-15 DIAGNOSIS — I2510 Atherosclerotic heart disease of native coronary artery without angina pectoris: Secondary | ICD-10-CM | POA: Diagnosis not present

## 2023-06-15 DIAGNOSIS — M4802 Spinal stenosis, cervical region: Secondary | ICD-10-CM | POA: Diagnosis not present

## 2023-06-15 DIAGNOSIS — R972 Elevated prostate specific antigen [PSA]: Secondary | ICD-10-CM | POA: Diagnosis not present

## 2023-06-15 DIAGNOSIS — I152 Hypertension secondary to endocrine disorders: Secondary | ICD-10-CM | POA: Diagnosis not present

## 2023-06-15 DIAGNOSIS — I5042 Chronic combined systolic (congestive) and diastolic (congestive) heart failure: Secondary | ICD-10-CM | POA: Diagnosis not present

## 2023-06-15 MED ORDER — METOPROLOL SUCCINATE ER 100 MG PO TB24
100.0000 mg | ORAL_TABLET | Freq: Every day | ORAL | 4 refills | Status: DC
  Filled 2023-06-15: qty 90, 90d supply, fill #0
  Filled 2023-08-25: qty 90, 90d supply, fill #1

## 2023-06-15 MED ORDER — SACUBITRIL-VALSARTAN 49-51 MG PO TABS
1.0000 | ORAL_TABLET | Freq: Two times a day (BID) | ORAL | 4 refills | Status: AC
  Filled 2023-06-15: qty 180, 90d supply, fill #0
  Filled 2023-08-25: qty 180, 90d supply, fill #1
  Filled 2023-09-23 – 2023-12-08 (×3): qty 180, 90d supply, fill #2
  Filled 2023-12-20 – 2024-03-06 (×3): qty 180, 90d supply, fill #3
  Filled 2024-05-29: qty 180, 90d supply, fill #4

## 2023-06-15 MED ORDER — POTASSIUM CHLORIDE CRYS ER 20 MEQ PO TBCR
20.0000 meq | EXTENDED_RELEASE_TABLET | Freq: Every day | ORAL | 3 refills | Status: AC
  Filled 2023-06-15 – 2023-09-29 (×4): qty 90, 90d supply, fill #0
  Filled 2023-12-08: qty 90, 90d supply, fill #1
  Filled 2023-12-20 – 2024-03-06 (×3): qty 90, 90d supply, fill #2
  Filled 2024-05-29: qty 90, 90d supply, fill #3

## 2023-06-15 MED ORDER — METFORMIN HCL ER 500 MG PO TB24
1000.0000 mg | ORAL_TABLET | Freq: Two times a day (BID) | ORAL | 3 refills | Status: AC
  Filled 2023-06-15: qty 360, 180d supply, fill #0
  Filled 2023-06-30 – 2023-09-23 (×3): qty 360, 90d supply, fill #0
  Filled 2023-09-29 – 2023-12-20 (×2): qty 360, 90d supply, fill #1
  Filled 2024-01-14 – 2024-04-09 (×2): qty 360, 90d supply, fill #2
  Filled 2024-05-29 – 2024-05-31 (×2): qty 360, 90d supply, fill #3

## 2023-06-15 MED ORDER — MOUNJARO 12.5 MG/0.5ML ~~LOC~~ SOAJ
12.5000 mg | SUBCUTANEOUS | 4 refills | Status: AC
  Filled 2023-06-15: qty 6, 84d supply, fill #0
  Filled 2023-08-25 – 2023-09-23 (×2): qty 6, 84d supply, fill #1
  Filled 2023-09-29 – 2023-12-20 (×2): qty 6, 84d supply, fill #2

## 2023-06-15 MED ORDER — FUROSEMIDE 40 MG PO TABS
40.0000 mg | ORAL_TABLET | Freq: Two times a day (BID) | ORAL | 4 refills | Status: DC | PRN
  Filled 2023-06-15: qty 180, 90d supply, fill #0
  Filled 2023-08-25: qty 180, 90d supply, fill #1

## 2023-06-15 MED ORDER — DAPAGLIFLOZIN PROPANEDIOL 10 MG PO TABS
10.0000 mg | ORAL_TABLET | Freq: Every day | ORAL | 4 refills | Status: DC
  Filled 2023-06-15: qty 90, 90d supply, fill #0
  Filled 2023-08-25: qty 90, 90d supply, fill #1
  Filled 2023-09-23 – 2023-12-08 (×3): qty 90, 90d supply, fill #2
  Filled 2023-12-20 – 2024-03-06 (×3): qty 90, 90d supply, fill #3
  Filled 2024-05-29: qty 90, 90d supply, fill #4

## 2023-06-15 MED ORDER — DILTIAZEM HCL ER 120 MG PO CP24
120.0000 mg | ORAL_CAPSULE | Freq: Every day | ORAL | 4 refills | Status: DC
  Filled 2023-06-15 – 2023-06-16 (×2): qty 90, 90d supply, fill #0
  Filled 2023-08-25: qty 90, 90d supply, fill #1

## 2023-06-15 MED ORDER — CLOPIDOGREL BISULFATE 75 MG PO TABS
75.0000 mg | ORAL_TABLET | Freq: Every morning | ORAL | 4 refills | Status: DC
  Filled 2023-06-15: qty 90, 90d supply, fill #0
  Filled 2023-08-25: qty 90, 90d supply, fill #1
  Filled 2023-09-23 – 2023-12-08 (×3): qty 90, 90d supply, fill #2
  Filled 2023-12-20 – 2024-03-06 (×3): qty 90, 90d supply, fill #3
  Filled 2024-05-31: qty 90, 90d supply, fill #4

## 2023-06-16 ENCOUNTER — Other Ambulatory Visit: Payer: Self-pay

## 2023-06-16 ENCOUNTER — Other Ambulatory Visit (HOSPITAL_COMMUNITY): Payer: Self-pay

## 2023-06-17 ENCOUNTER — Other Ambulatory Visit (HOSPITAL_COMMUNITY): Payer: Self-pay

## 2023-06-20 ENCOUNTER — Other Ambulatory Visit (HOSPITAL_BASED_OUTPATIENT_CLINIC_OR_DEPARTMENT_OTHER): Payer: Self-pay

## 2023-06-20 ENCOUNTER — Telehealth (HOSPITAL_BASED_OUTPATIENT_CLINIC_OR_DEPARTMENT_OTHER): Payer: Self-pay | Admitting: Cardiovascular Disease

## 2023-06-20 ENCOUNTER — Other Ambulatory Visit (HOSPITAL_COMMUNITY): Payer: Self-pay

## 2023-06-20 NOTE — Telephone Encounter (Signed)
  Patient states his pharmacy was changed in error. Please send all medications going forward to [facility]. Please update chart.

## 2023-06-20 NOTE — Telephone Encounter (Signed)
Pharmacy changed on pt's chart

## 2023-06-22 DIAGNOSIS — R350 Frequency of micturition: Secondary | ICD-10-CM | POA: Diagnosis not present

## 2023-06-22 DIAGNOSIS — I2510 Atherosclerotic heart disease of native coronary artery without angina pectoris: Secondary | ICD-10-CM | POA: Diagnosis not present

## 2023-06-22 DIAGNOSIS — R351 Nocturia: Secondary | ICD-10-CM | POA: Diagnosis not present

## 2023-06-22 DIAGNOSIS — D759 Disease of blood and blood-forming organs, unspecified: Secondary | ICD-10-CM | POA: Diagnosis not present

## 2023-06-22 DIAGNOSIS — E1159 Type 2 diabetes mellitus with other circulatory complications: Secondary | ICD-10-CM | POA: Diagnosis not present

## 2023-06-22 DIAGNOSIS — M4802 Spinal stenosis, cervical region: Secondary | ICD-10-CM | POA: Diagnosis not present

## 2023-06-22 DIAGNOSIS — Z791 Long term (current) use of non-steroidal anti-inflammatories (NSAID): Secondary | ICD-10-CM | POA: Diagnosis not present

## 2023-06-22 DIAGNOSIS — R972 Elevated prostate specific antigen [PSA]: Secondary | ICD-10-CM | POA: Diagnosis not present

## 2023-06-22 DIAGNOSIS — M130 Polyarthritis, unspecified: Secondary | ICD-10-CM | POA: Diagnosis not present

## 2023-06-22 DIAGNOSIS — N401 Enlarged prostate with lower urinary tract symptoms: Secondary | ICD-10-CM | POA: Diagnosis not present

## 2023-06-22 DIAGNOSIS — I152 Hypertension secondary to endocrine disorders: Secondary | ICD-10-CM | POA: Diagnosis not present

## 2023-06-22 DIAGNOSIS — D696 Thrombocytopenia, unspecified: Secondary | ICD-10-CM | POA: Diagnosis not present

## 2023-06-22 DIAGNOSIS — I479 Paroxysmal tachycardia, unspecified: Secondary | ICD-10-CM | POA: Diagnosis not present

## 2023-06-22 DIAGNOSIS — I252 Old myocardial infarction: Secondary | ICD-10-CM | POA: Diagnosis not present

## 2023-06-22 DIAGNOSIS — H547 Unspecified visual loss: Secondary | ICD-10-CM | POA: Diagnosis not present

## 2023-06-22 DIAGNOSIS — I5042 Chronic combined systolic (congestive) and diastolic (congestive) heart failure: Secondary | ICD-10-CM | POA: Diagnosis not present

## 2023-06-22 DIAGNOSIS — Z7984 Long term (current) use of oral hypoglycemic drugs: Secondary | ICD-10-CM | POA: Diagnosis not present

## 2023-06-22 DIAGNOSIS — Z981 Arthrodesis status: Secondary | ICD-10-CM | POA: Diagnosis not present

## 2023-06-22 DIAGNOSIS — Z7902 Long term (current) use of antithrombotics/antiplatelets: Secondary | ICD-10-CM | POA: Diagnosis not present

## 2023-06-22 DIAGNOSIS — Z6834 Body mass index (BMI) 34.0-34.9, adult: Secondary | ICD-10-CM | POA: Diagnosis not present

## 2023-06-22 DIAGNOSIS — E785 Hyperlipidemia, unspecified: Secondary | ICD-10-CM | POA: Diagnosis not present

## 2023-06-22 DIAGNOSIS — N3941 Urge incontinence: Secondary | ICD-10-CM | POA: Diagnosis not present

## 2023-06-22 DIAGNOSIS — G4733 Obstructive sleep apnea (adult) (pediatric): Secondary | ICD-10-CM | POA: Diagnosis not present

## 2023-06-22 DIAGNOSIS — Z7985 Long-term (current) use of injectable non-insulin antidiabetic drugs: Secondary | ICD-10-CM | POA: Diagnosis not present

## 2023-06-22 DIAGNOSIS — T8131XD Disruption of external operation (surgical) wound, not elsewhere classified, subsequent encounter: Secondary | ICD-10-CM | POA: Diagnosis not present

## 2023-06-22 DIAGNOSIS — Z951 Presence of aortocoronary bypass graft: Secondary | ICD-10-CM | POA: Diagnosis not present

## 2023-06-23 DIAGNOSIS — I152 Hypertension secondary to endocrine disorders: Secondary | ICD-10-CM | POA: Diagnosis not present

## 2023-06-23 DIAGNOSIS — I5042 Chronic combined systolic (congestive) and diastolic (congestive) heart failure: Secondary | ICD-10-CM | POA: Diagnosis not present

## 2023-06-23 DIAGNOSIS — I2510 Atherosclerotic heart disease of native coronary artery without angina pectoris: Secondary | ICD-10-CM | POA: Diagnosis not present

## 2023-06-23 DIAGNOSIS — M4802 Spinal stenosis, cervical region: Secondary | ICD-10-CM | POA: Diagnosis not present

## 2023-06-23 DIAGNOSIS — E1159 Type 2 diabetes mellitus with other circulatory complications: Secondary | ICD-10-CM | POA: Diagnosis not present

## 2023-06-23 DIAGNOSIS — T8131XD Disruption of external operation (surgical) wound, not elsewhere classified, subsequent encounter: Secondary | ICD-10-CM | POA: Diagnosis not present

## 2023-06-30 ENCOUNTER — Other Ambulatory Visit (HOSPITAL_COMMUNITY): Payer: Self-pay

## 2023-06-30 ENCOUNTER — Other Ambulatory Visit: Payer: Self-pay

## 2023-07-01 ENCOUNTER — Other Ambulatory Visit (HOSPITAL_BASED_OUTPATIENT_CLINIC_OR_DEPARTMENT_OTHER): Payer: Self-pay

## 2023-07-04 ENCOUNTER — Other Ambulatory Visit (HOSPITAL_COMMUNITY): Payer: Self-pay

## 2023-07-12 DIAGNOSIS — M4712 Other spondylosis with myelopathy, cervical region: Secondary | ICD-10-CM | POA: Diagnosis not present

## 2023-07-15 ENCOUNTER — Other Ambulatory Visit (HOSPITAL_COMMUNITY): Payer: Self-pay

## 2023-07-15 MED ORDER — GEMTESA 75 MG PO TABS
75.0000 mg | ORAL_TABLET | Freq: Every day | ORAL | 5 refills | Status: DC
  Filled 2023-07-15 – 2023-07-18 (×2): qty 30, 30d supply, fill #0

## 2023-07-18 ENCOUNTER — Other Ambulatory Visit (HOSPITAL_COMMUNITY): Payer: Self-pay

## 2023-07-19 ENCOUNTER — Other Ambulatory Visit (HOSPITAL_COMMUNITY): Payer: Self-pay

## 2023-07-24 NOTE — Therapy (Signed)
 OUTPATIENT PHYSICAL THERAPY CERVICAL EVALUATION   Patient Name: TESLA BOCHICCHIO, MD MRN: [id number] DOB:04-05-1951, 73 y.o., male Today's Date: 07/26/2023  END OF SESSION:  PT End of Session - 07/25/23 1608     Visit Number 1    Number of Visits 12    Date for PT Re-Evaluation 09/06/23    Authorization Type MCR A and B    PT Start Time 1453    PT Stop Time 1553    PT Time Calculation (min) 60 min    Activity Tolerance Patient tolerated treatment well    Behavior During Therapy [facility] for tasks assessed/performed             Past Medical History:  Diagnosis Date   Aneurysm of ascending aorta (HCC) 06/06/2023   Chronic combined systolic and diastolic CHF (congestive heart failure) (HCC)    Coronary atherosclerosis of native coronary artery    Dental crowns present    Essential hypertension, benign    states under control with meds., has been on med. x 15 yr.   GERD (gastroesophageal reflux disease)    Hepatitis    Drug induced hepatitis   MI (myocardial infarction) (HCC) 10/27/2002   Non-insulin dependent type 2 diabetes mellitus (HCC)    Obesity    Osteoarthritis    hips and knees   Pure hypercholesterolemia    Sleep apnea    Past Surgical History:  Procedure Laterality Date   ANKLE SURGERY Left    as a child   ANTERIOR CERVICAL DECOMP/DISCECTOMY FUSION Left 11/09/2021   Procedure: ANTERIOR CERVICAL DECOMPRESSION/DISCECTOMY FUSION 3 LEVELS C3-C6;  Surgeon: Venita Lick, MD;  Location: [facility];  Service: Orthopedics;  Laterality: Left;  4 hrs 3 C-Bed   ANTERIOR INTEROSSEOUS NERVE DECOMPRESSION Right 04/15/2021   Procedure: ANTERIOR INTEROSSEOUS NERVE DECOMPRESSION;  Surgeon: Dairl Ponder, MD;  Location: [facility];  Service: Orthopedics;  Laterality: Right;  Only need 1 hour for case,  Axillary block/Mac   CARDIAC CATHETERIZATION  10/27/2002   CARPAL TUNNEL RELEASE Right 11/21/2013   CARPAL TUNNEL RELEASE Right 04/15/2021   Procedure: CARPAL  TUNNEL RELEASE;  Surgeon: Dairl Ponder, MD;  Location: [facility];  Service: Orthopedics;  Laterality: Right;   COLONOSCOPY WITH PROPOFOL N/A 04/29/2023   Procedure: COLONOSCOPY WITH PROPOFOL;  Surgeon: Jeani Hawking, MD;  Location: [facility];  Service: Gastroenterology;  Laterality: N/A;   CORONARY STENT PLACEMENT  10/27/2002   mid LAD   TOTAL HIP ARTHROPLASTY Left 04/09/1999   TOTAL HIP ARTHROPLASTY Right    TOTAL KNEE ARTHROPLASTY Left 02/03/2011   TOTAL KNEE ARTHROPLASTY Right 05/11/2021   Procedure: TOTAL KNEE ARTHROPLASTY;  Surgeon: Ollen Gross, MD;  Location: [facility];  Service: Orthopedics;  Laterality: Right;   ULNAR TUNNEL RELEASE Left 06/18/2016   Procedure: LEFT CUBITAL TUNNEL RELEASE;  Surgeon: Dairl Ponder, MD;  Location: [facility];  Service: Orthopedics;  Laterality: Left;   Patient Active Problem List   Diagnosis Date Noted   Aneurysm of ascending aorta (HCC) 06/06/2023   Cervical myelopathy (HCC) 11/09/2021   OA (osteoarthritis) of knee 05/11/2021   Primary osteoarthritis of right knee 05/11/2021   Colon cancer screening 10/31/2020   Gastroesophageal reflux disease 10/31/2020   Morbid obesity (HCC) 10/31/2020   History of colonic polyps 10/31/2020   Pain of left hip joint 12/13/2019   AVNRT (AV nodal re-entry tachycardia) (HCC)    Withdrawal arrhythmia (HCC)    Cubital tunnel syndrome, left 06/29/2016   CAD (coronary artery  disease), native coronary artery 05/08/2014   Hyperlipidemia 03/21/2013    Class: Chronic   Old MI (myocardial infarction) 03/21/2013    Class: Chronic   DM (diabetes mellitus), type 2, uncontrolled 03/21/2013    Class: Chronic   Essential hypertension, benign 03/21/2013    Class: Chronic   Obesity (BMI 30-39.9) 03/21/2013   Chronic combined systolic and diastolic CHF (congestive heart failure) (HCC)      REFERRING PROVIDER: Doretha Sou, MD   REFERRING DIAG: [phone] (ICD-10-CM) - Other  spondylosis with myelopathy, cervical region   THERAPY DIAG:  Muscle weakness (generalized)  Other abnormalities of gait and mobility  Cervicalgia  Stiffness of right shoulder, not elsewhere classified  Stiffness of left shoulder, not elsewhere classified  Rationale for Evaluation and Treatment: Rehabilitation  ONSET DATE: DOS  11/19/2022  SUBJECTIVE:                                                                                                                                                                                                         SUBJECTIVE STATEMENT: Pt states it started off as carpal tunnel.  He had CTR though the numbness returned.  Pt had an anterior cervical fusion C3-6.  He had PT and was improving.  He stopped PT and had increased pain in December 2023.  Pt saw MD and had a repeat MRI.  Pt was informed that C1-3 was the problem.  He then had a posterior C1-3 fusion in June 2024.  He had significant difficulty with function and mobility after that surgery.  He states his Ues and LE shut down.  Pt had 3 months of IP PT.  Pt has a HEP from IP PT though hasn't been performing them much lately.    Pt saw MD on 2/18.  Pt states MD stated things are looking good and it's going to take time.   He reports having tightness and R sided cervical pain which feels positional.  If he hold his head up in good posture, the pain and tightness improve.  Pt reports having weakness in bilat Ue's and LE's.  Pt reports having gait and balance deficits.  Pt has limitations in dexterity in hands and has difficulty opening bottles.   Hand dominance: Right  PERTINENT HISTORY:  CHF, MI, coronary artery disease with stent placement, HTN, tachycardia, and Aneurysm of ascending aorta   DM type 2 OA  PSHx: Cervical fusion C3-6 10/2021 and C1-C3 posterior spinal fusion 11/19/2022 Bilat THA (2000 and 2002) Bilat TKA  (2012 and 2022) R wrist CTR R Forearm/Anterior  interrosseous nerve  decompression 03/2021 L Ulnar tunnel release L ankle surgery (child)   PAIN:  NPRS:  0/10 current and best, 5-6/10 worst  Location:  R UT, R sided cervical Type:  Numbness in bilat feet, R > L and bilat hands.  Burning in L hand. Aggravating factors:  Looking to the Right for a prolonged time including looking at the computer screen. Easing factors:  having correct posture    PRECAUTIONS: Other: cervical fusion x 2, bilat THA, bilat TKA, aneurysm of ascending aorta     WEIGHT BEARING RESTRICTIONS: none indicated  FALLS:  Has patient fallen in last 6 months? 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    PLOF: Independent  Pt has been using the cane since the 1st cervical fusion surgery.  Pt had better quality of gait/ambulation prior to the 2nd cervical fusion.  PATIENT GOALS: improve strength and coordination.    OBJECTIVE:  Note: Objective measures were completed at Evaluation unless otherwise noted.  DIAGNOSTIC FINDINGS:    PATIENT SURVEYS:  UEFI:62/80  COGNITION: Overall cognitive status: Within functional limits for tasks assessed  SENSATION: L L5 and C5 1+.  2+ otherwise t/o bilat UE and LE dermatomes.  POSTURE: Pt has FHP with increased cervical flexion.    PALPATION: Pt had no tenderness with palpation of cervical and bilat UT.    CERVICAL ROM:   Active ROM A/PROM (deg) eval  Flexion 13  Extension 32  Right lateral flexion   Left lateral flexion   Right rotation 22  Left rotation 20   (Blank rows = not tested) Pt had no pain with cervical AROM  Shoulder flexion AROM:  R:  102, L:  113  UE and LE STRENGTH:  MMT and HHD Right eval Left eval  Shoulder flexion        Hip abduction 21.1 25  Shoulder adduction    Shoulder extension    Shoulder internal rotation     Shoulder external rotation    Elbow flexion 5/5 5/5  Elbow extension 4/5 seated 4/5 seated  Wrist flexion 4+ to 5/5  4/5  Wrist extension Weakness in bilat though worse on R   Knee flexion 5/5 seated 5/5 seated  Knee extension 5/5 5/5  DF 5/5 5/5  PF WFL seated WFL seated   (Blank rows = not tested)    FUNCTIONAL TESTS:  Transfers:  Pt required multiple attempts to stand up from chair when asked to stand up.  5x STS test:  9.44 seconds without UE's  Gait:  cane on L, decreased step length L > R, Cervical in flexion with gait.  TREATMENT DATE:  See below for pt education.  PATIENT EDUCATION:  Education details: relevant anatomy, objective findings, POC, rationale of interventions, and what to expect next treatment.  PT answered pt's questions.  Person educated: Patient Education method: Explanation Education comprehension: verbalized understanding  HOME EXERCISE PROGRAM: Pt has a HEP from IP rehab.  Will update and give at a later date.  ASSESSMENT:  CLINICAL IMPRESSION: Patient is a 73 y.o. male s/p C3-6 fusion in 10/2021 and C1-C3 posterior spinal fusion on 11/19/2022 presenting to the clinic with muscle weakness, limited shoulder flexion ROM R>L, cervicalgia, and gait abnormality.  Pt had significant difficulty with function and mobility after 2nd fusion surgery.  He states his Ues and LEs shut down.  Pt had 3 months of IP PT which pt responded well and improved.  Pt has expected limitations in cervical ROM concerning he has had 2 fusions.  Pt has weakness in bilat Ue's and bilat hip abd.  Pt reports balance deficits and limitations with hand dexterity.   Pt required multiple attempts to stand up from chair when asked to stand up though did well on the 5x STS test being WNL.  Pt has multiple co-morbidities.  He should benefit from skilled PT services to  address impairments and to assist in restoring desired level of function.   OBJECTIVE IMPAIRMENTS: Abnormal gait, decreased activity tolerance, decreased balance, decreased endurance, decreased mobility, difficulty walking, decreased ROM, decreased strength, hypomobility, impaired flexibility, impaired UE functional use, postural dysfunction, and pain.   ACTIVITY LIMITATIONS: squatting, transfers, reach over head, and locomotion level  PARTICIPATION LIMITATIONS: cleaning, shopping, and community activity  PERSONAL FACTORS: 3+ comorbidities: bilat THA, bilat TKA, CHF, DM, OA  are also affecting patient's functional outcome.   REHAB POTENTIAL: Good  CLINICAL DECISION MAKING: Stable/uncomplicated  EVALUATION COMPLEXITY: Low   GOALS:   SHORT TERM GOALS: Target date: 08/15/23   Pt will be independent and compliant with HEP for improved strength, pain, and mobility.  Baseline:  Goal status: INITIAL  2.  Pt will demo at least 10-15 deg in bilat shoulder flexion AROM for improved reaching.  Baseline:  Goal status: INITIAL  3.  Pt will report at least a 25% improvement in his gait and balance.   Baseline:  Goal status: INITIAL  4.  Pt will tolerate postural/scapular strengthening without adverse effects for improved stability and posture with daily and work activities and to reduce stress on cervical.  Baseline:  Goal status: INITIAL    LONG TERM GOALS: Target date: 09/06/2023  Pt will demo improved bilat elbow extension strength to 5/5 and bilat hip abd strength by at least 5-8 lbs for improved performance of transfers, mobility, and gait.  Baseline:  Goal status: INITIAL  2.  Pt will report at least a 70% improvement in his gait and balance.  Baseline:  Goal status: INITIAL  3.  Pt will demo improved posture with gait and improved step length bilat. Baseline:  Goal status: INITIAL    PLAN:  PT FREQUENCY: 2x/week  PT DURATION: 6 weeks  PLANNED INTERVENTIONS:  97164- PT Re-evaluation, 97110-Therapeutic exercises, 97530- Therapeutic activity, [id number]- Neuromuscular re-education, 97535- Self Care, 16109- Manual therapy, [phone]- Gait training, [phone]- Aquatic Therapy, Patient/Family education, Balance training, Stair training, Taping, Dry Needling, Joint mobilization, Cryotherapy, and Moist heat  PLAN FOR NEXT SESSION: Postural/scapular strengthening with theraband including rows and extensions.  Elbow strengthening.  Check hand opposition.  Work on balance and gait.    Audie Clear III PT, DPT 07/27/23 2:06 PM

## 2023-07-25 ENCOUNTER — Ambulatory Visit (HOSPITAL_BASED_OUTPATIENT_CLINIC_OR_DEPARTMENT_OTHER): Payer: Medicare Other | Attending: Internal Medicine | Admitting: Physical Therapy

## 2023-07-25 DIAGNOSIS — M4712 Other spondylosis with myelopathy, cervical region: Secondary | ICD-10-CM | POA: Diagnosis not present

## 2023-07-25 DIAGNOSIS — M542 Cervicalgia: Secondary | ICD-10-CM

## 2023-07-25 DIAGNOSIS — M25611 Stiffness of right shoulder, not elsewhere classified: Secondary | ICD-10-CM

## 2023-07-25 DIAGNOSIS — M25612 Stiffness of left shoulder, not elsewhere classified: Secondary | ICD-10-CM

## 2023-07-25 DIAGNOSIS — M6281 Muscle weakness (generalized): Secondary | ICD-10-CM

## 2023-07-25 DIAGNOSIS — R2689 Other abnormalities of gait and mobility: Secondary | ICD-10-CM

## 2023-07-26 ENCOUNTER — Other Ambulatory Visit: Payer: Self-pay

## 2023-07-26 ENCOUNTER — Encounter (HOSPITAL_BASED_OUTPATIENT_CLINIC_OR_DEPARTMENT_OTHER): Payer: Self-pay | Admitting: Physical Therapy

## 2023-07-27 ENCOUNTER — Other Ambulatory Visit: Payer: Self-pay

## 2023-07-27 ENCOUNTER — Other Ambulatory Visit (HOSPITAL_COMMUNITY): Payer: Self-pay

## 2023-07-28 ENCOUNTER — Ambulatory Visit (HOSPITAL_BASED_OUTPATIENT_CLINIC_OR_DEPARTMENT_OTHER): Admitting: Physical Therapy

## 2023-07-28 DIAGNOSIS — M25611 Stiffness of right shoulder, not elsewhere classified: Secondary | ICD-10-CM

## 2023-07-28 DIAGNOSIS — R2689 Other abnormalities of gait and mobility: Secondary | ICD-10-CM

## 2023-07-28 DIAGNOSIS — M6281 Muscle weakness (generalized): Secondary | ICD-10-CM

## 2023-07-28 DIAGNOSIS — M25612 Stiffness of left shoulder, not elsewhere classified: Secondary | ICD-10-CM

## 2023-07-28 DIAGNOSIS — M542 Cervicalgia: Secondary | ICD-10-CM

## 2023-07-28 DIAGNOSIS — M4712 Other spondylosis with myelopathy, cervical region: Secondary | ICD-10-CM | POA: Diagnosis not present

## 2023-07-28 NOTE — Therapy (Signed)
 OUTPATIENT PHYSICAL THERAPY CERVICAL EVALUATION   Patient Name: Ryan MCCLIMANS, MD MRN: [id number] DOB:07-11-1950, 73 y.o., male Today's Date: 07/29/2023  END OF SESSION:  PT End of Session - 07/28/23 1627     Visit Number 2    Number of Visits 12    Date for PT Re-Evaluation 09/06/23    Authorization Type MCR A and B    PT Start Time 1626   Pt arrived late to treatment.   PT Stop Time 1659    PT Time Calculation (min) 33 min    Activity Tolerance Patient tolerated treatment well    Behavior During Therapy WFL for tasks assessed/performed             Past Medical History:  Diagnosis Date   Aneurysm of ascending aorta (HCC) 06/06/2023   Chronic combined systolic and diastolic CHF (congestive heart failure) (HCC)    Coronary atherosclerosis of native coronary artery    Dental crowns present    Essential hypertension, benign    states under control with meds., has been on med. x 15 yr.   GERD (gastroesophageal reflux disease)    Hepatitis    Drug induced hepatitis   MI (myocardial infarction) (HCC) 10/27/2002   Non-insulin dependent type 2 diabetes mellitus (HCC)    Obesity    Osteoarthritis    hips and knees   Pure hypercholesterolemia    Sleep apnea    Past Surgical History:  Procedure Laterality Date   ANKLE SURGERY Left    as a child   ANTERIOR CERVICAL DECOMP/DISCECTOMY FUSION Left 11/09/2021   Procedure: ANTERIOR CERVICAL DECOMPRESSION/DISCECTOMY FUSION 3 LEVELS C3-C6;  Surgeon: Venita Lick, MD;  Location: [facility];  Service: Orthopedics;  Laterality: Left;  4 hrs 3 C-Bed   ANTERIOR INTEROSSEOUS NERVE DECOMPRESSION Right 04/15/2021   Procedure: ANTERIOR INTEROSSEOUS NERVE DECOMPRESSION;  Surgeon: Dairl Ponder, MD;  Location: [facility];  Service: Orthopedics;  Laterality: Right;  Only need 1 hour for case,  Axillary block/Mac   CARDIAC CATHETERIZATION  10/27/2002   CARPAL TUNNEL RELEASE Right 11/21/2013   CARPAL TUNNEL RELEASE Right  04/15/2021   Procedure: CARPAL TUNNEL RELEASE;  Surgeon: Dairl Ponder, MD;  Location: [facility];  Service: Orthopedics;  Laterality: Right;   COLONOSCOPY WITH PROPOFOL N/A 04/29/2023   Procedure: COLONOSCOPY WITH PROPOFOL;  Surgeon: Jeani Hawking, MD;  Location: [facility];  Service: Gastroenterology;  Laterality: N/A;   CORONARY STENT PLACEMENT  10/27/2002   mid LAD   TOTAL HIP ARTHROPLASTY Left 04/09/1999   TOTAL HIP ARTHROPLASTY Right    TOTAL KNEE ARTHROPLASTY Left 02/03/2011   TOTAL KNEE ARTHROPLASTY Right 05/11/2021   Procedure: TOTAL KNEE ARTHROPLASTY;  Surgeon: Ollen Gross, MD;  Location: [facility];  Service: Orthopedics;  Laterality: Right;   ULNAR TUNNEL RELEASE Left 06/18/2016   Procedure: LEFT CUBITAL TUNNEL RELEASE;  Surgeon: Dairl Ponder, MD;  Location: [facility];  Service: Orthopedics;  Laterality: Left;   Patient Active Problem List   Diagnosis Date Noted   Aneurysm of ascending aorta (HCC) 06/06/2023   Cervical myelopathy (HCC) 11/09/2021   OA (osteoarthritis) of knee 05/11/2021   Primary osteoarthritis of right knee 05/11/2021   Colon cancer screening 10/31/2020   Gastroesophageal reflux disease 10/31/2020   Morbid obesity (HCC) 10/31/2020   History of colonic polyps 10/31/2020   Pain of left hip joint 12/13/2019   AVNRT (AV nodal re-entry tachycardia) (HCC)    Withdrawal arrhythmia (HCC)    Cubital tunnel syndrome, left  06/29/2016   CAD (coronary artery disease), native coronary artery 05/08/2014   Hyperlipidemia 03/21/2013    Class: Chronic   Old MI (myocardial infarction) 03/21/2013    Class: Chronic   DM (diabetes mellitus), type 2, uncontrolled 03/21/2013    Class: Chronic   Essential hypertension, benign 03/21/2013    Class: Chronic   Obesity (BMI 30-39.9) 03/21/2013   Chronic combined systolic and diastolic CHF (congestive heart failure) (HCC)      REFERRING PROVIDER: Doretha Sou, MD   REFERRING  DIAG: [phone] (ICD-10-CM) - Other spondylosis with myelopathy, cervical region   THERAPY DIAG:  Muscle weakness (generalized)  Other abnormalities of gait and mobility  Cervicalgia  Stiffness of right shoulder, not elsewhere classified  Stiffness of left shoulder, not elsewhere classified  Rationale for Evaluation and Treatment: Rehabilitation  ONSET DATE: DOS  11/19/2022  SUBJECTIVE:                                                                                                                                                                                                         SUBJECTIVE STATEMENT: Pt states he felt good after prior Rx.  He denies pain currently.    Hand dominance: Right  PERTINENT HISTORY:  CHF, MI, coronary artery disease with stent placement, HTN, tachycardia, and Aneurysm of ascending aorta   DM type 2 OA  PSHx: Cervical fusion C3-6 10/2021 and C1-C3 posterior spinal fusion 11/19/2022 Bilat THA (2000 and 2002) Bilat TKA  (2012 and 2022) R wrist CTR R Forearm/Anterior interrosseous nerve decompression 03/2021 L Ulnar tunnel release L ankle surgery (child)   PAIN:  NPRS:  0/10 current and best, 5-6/10 worst  Location:  R UT, R sided cervical Type:  Numbness in bilat feet, R > L and bilat hands.  Burning in L hand. Aggravating factors:  Looking to the Right for a prolonged time including looking at the computer screen. Easing factors:  having correct posture    PRECAUTIONS: Other: cervical fusion x 2, bilat THA, bilat TKA, aneurysm of ascending aorta     WEIGHT BEARING RESTRICTIONS: none indicated  FALLS:  Has patient fallen in last 6 months? 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    PLOF: Independent  Pt has been using the cane since the 1st cervical  fusion surgery.  Pt had better quality of gait/ambulation  prior to the 2nd cervical fusion.  PATIENT GOALS: improve strength and coordination.    OBJECTIVE:  Note: Objective measures were completed at Evaluation unless otherwise noted.  DIAGNOSTIC FINDINGS:    PATIENT SURVEYS:  UEFI:62/80  COGNITION: Overall cognitive status: Within functional limits for tasks assessed  SENSATION: L L5 and C5 1+.  2+ otherwise t/o bilat UE and LE dermatomes.  POSTURE: Pt has FHP with increased cervical flexion.    PALPATION: Pt had no tenderness with palpation of cervical and bilat UT.    CERVICAL ROM:   Active ROM A/PROM (deg) eval  Flexion 13  Extension 32  Right lateral flexion   Left lateral flexion   Right rotation 22  Left rotation 20   (Blank rows = not tested) Pt had no pain with cervical AROM  Shoulder flexion AROM:  R:  102, L:  113  UE and LE STRENGTH:  MMT and HHD Right eval Left eval  Shoulder flexion        Hip abduction 21.1 25  Shoulder adduction    Shoulder extension    Shoulder internal rotation    Shoulder external rotation    Elbow flexion 5/5 5/5  Elbow extension 4/5 seated 4/5 seated  Wrist flexion 4+ to 5/5  4/5  Wrist extension Weakness in bilat though worse on R   Knee flexion 5/5 seated 5/5 seated  Knee extension 5/5 5/5  DF 5/5 5/5  PF WFL seated WFL seated   (Blank rows = not tested)    FUNCTIONAL TESTS:  Transfers:  Pt required multiple attempts to stand up from chair when asked to stand up.  5x STS test:  9.44 seconds without UE's  Gait:  cane on L, decreased step length L > R, Cervical in flexion with gait.  TREATMENT DATE:                                                                                                                                Opposition:  R:  unable to reach 5th digit, L:  able to reach 5th digit though has difficulty.   Opposition x10 bilat Standing rows with RTB with retraction 2x15 Standing shoulder  extension with RTB 2x15 Standing triceps extension 2x10 with RTB  Standing biceps curl  3# 2x10 Pulleys in flexion 2x15  Sidestepping at rail with UE support x 3 laps Marching on airex with bilat UE support 2x10 Standing on airex 2x30 sec without UE support with SBA   PATIENT EDUCATION:  Education details:  relevant anatomy, objective findings, POC, rationale of interventions, and what to expect next treatment.  PT answered pt's questions.  Person educated: Patient Education method: Explanation, demonstration, verbal and tactile cues Education comprehension: verbalized understanding, returned demonstration, verbal and tactile cues required  HOME EXERCISE PROGRAM: Pt has a HEP from IP rehab.  Will update and give at a later date.  ASSESSMENT:  CLINICAL IMPRESSION: PT performed exercises to improve  UE and postural strength, balance, gait, mobility, and hand dexterity.  Pt performed exercises well with cuing and instruction in correct form.  Pt has difficulty with opposition to 5th digit which is worse on R.  Pt brought in prior HEP from IP PT.  PT reviewed it which consisted of putty exercises and seated LE strengthening.  Pt states that is not all of the exercises though what he could find.  He responded well to Rx reporting no pain after Rx.   He should benefit from skilled PT services to address impairments and to assist in restoring desired level of function.    OBJECTIVE IMPAIRMENTS: Abnormal gait, decreased activity tolerance, decreased balance, decreased endurance, decreased mobility, difficulty walking, decreased ROM, decreased strength, hypomobility, impaired flexibility, impaired UE functional use, postural dysfunction, and pain.   ACTIVITY LIMITATIONS: squatting, transfers, reach over head, and locomotion level  PARTICIPATION LIMITATIONS: cleaning, shopping, and community activity  PERSONAL FACTORS: 3+ comorbidities: bilat THA, bilat TKA, CHF, DM, OA  are also affecting  patient's functional outcome.   REHAB POTENTIAL: Good  CLINICAL DECISION MAKING: Stable/uncomplicated  EVALUATION COMPLEXITY: Low   GOALS:   SHORT TERM GOALS: Target date: 08/15/23   Pt will be independent and compliant with HEP for improved strength, pain, and mobility.  Baseline:  Goal status: INITIAL  2.  Pt will demo at least 10-15 deg in bilat shoulder flexion AROM for improved reaching.  Baseline:  Goal status: INITIAL  3.  Pt will report at least a 25% improvement in his gait and balance.   Baseline:  Goal status: INITIAL  4.  Pt will tolerate postural/scapular strengthening without adverse effects for improved stability and posture with daily and work activities and to reduce stress on cervical.  Baseline:  Goal status: INITIAL    LONG TERM GOALS: Target date: 09/06/2023  Pt will demo improved bilat elbow extension strength to 5/5 and bilat hip abd strength by at least 5-8 lbs for improved performance of transfers, mobility, and gait.  Baseline:  Goal status: INITIAL  2.  Pt will report at least a 70% improvement in his gait and balance.  Baseline:  Goal status: INITIAL  3.  Pt will demo improved posture with gait and improved step length bilat. Baseline:  Goal status: INITIAL    PLAN:  PT FREQUENCY: 2x/week  PT DURATION: 6 weeks  PLANNED INTERVENTIONS: 97164- PT Re-evaluation, 97110-Therapeutic exercises, 97530- Therapeutic activity, [id number]- Neuromuscular re-education, 97535- Self Care, [id number]- Manual therapy, [phone]- Gait training, [phone]- Aquatic Therapy, Patient/Family education, Balance training, Stair training, Taping, Dry Needling, Joint mobilization, Cryotherapy, and Moist heat  PLAN FOR NEXT SESSION: Postural/scapular strengthening with theraband, elbow strengthening, balance and gait.  Give HEP    Audie Clear III PT, DPT 07/29/23 5:58 PM

## 2023-07-29 ENCOUNTER — Encounter: Payer: Self-pay | Admitting: Podiatry

## 2023-07-29 ENCOUNTER — Encounter (HOSPITAL_BASED_OUTPATIENT_CLINIC_OR_DEPARTMENT_OTHER): Payer: Self-pay | Admitting: Physical Therapy

## 2023-07-29 ENCOUNTER — Ambulatory Visit (INDEPENDENT_AMBULATORY_CARE_PROVIDER_SITE_OTHER): Payer: Medicare Other | Admitting: Podiatry

## 2023-07-29 DIAGNOSIS — M79674 Pain in right toe(s): Secondary | ICD-10-CM

## 2023-07-29 DIAGNOSIS — B351 Tinea unguium: Secondary | ICD-10-CM | POA: Diagnosis not present

## 2023-07-29 DIAGNOSIS — M79675 Pain in left toe(s): Secondary | ICD-10-CM

## 2023-08-01 ENCOUNTER — Ambulatory Visit (HOSPITAL_BASED_OUTPATIENT_CLINIC_OR_DEPARTMENT_OTHER): Admitting: Physical Therapy

## 2023-08-02 NOTE — Progress Notes (Signed)
 Subjective:   Patient ID: Ryan Bad, MD, male   DOB: 73 y.o.   MRN: [id number]   HPI Patient presents with painful nailbeds 1-5 both feet that get thick and he cannot cut himself   ROS      Objective:  Physical Exam  Neurovascular status intact with thick yellow brittle nailbeds 1-5 both feet     Assessment:  Chronic mycotic nail infection with pain 1-5 both feet     Plan:  Debride nailbeds 1-5 both feet neurogenic bleeding reappoint routine care

## 2023-08-04 ENCOUNTER — Encounter (HOSPITAL_BASED_OUTPATIENT_CLINIC_OR_DEPARTMENT_OTHER): Payer: Self-pay | Admitting: Physical Therapy

## 2023-08-04 ENCOUNTER — Ambulatory Visit (HOSPITAL_BASED_OUTPATIENT_CLINIC_OR_DEPARTMENT_OTHER): Payer: Self-pay | Admitting: Physical Therapy

## 2023-08-04 DIAGNOSIS — M4712 Other spondylosis with myelopathy, cervical region: Secondary | ICD-10-CM | POA: Diagnosis not present

## 2023-08-04 DIAGNOSIS — R2689 Other abnormalities of gait and mobility: Secondary | ICD-10-CM

## 2023-08-04 DIAGNOSIS — M6281 Muscle weakness (generalized): Secondary | ICD-10-CM

## 2023-08-04 DIAGNOSIS — M25611 Stiffness of right shoulder, not elsewhere classified: Secondary | ICD-10-CM

## 2023-08-04 DIAGNOSIS — M25612 Stiffness of left shoulder, not elsewhere classified: Secondary | ICD-10-CM

## 2023-08-04 DIAGNOSIS — M542 Cervicalgia: Secondary | ICD-10-CM

## 2023-08-04 NOTE — Therapy (Signed)
 OUTPATIENT PHYSICAL THERAPY CERVICAL TREATMENT   Patient Name: Ryan KALB, MD MRN: [id number] DOB:1950/05/28, 73 y.o., male Today's Date: 08/04/2023  END OF SESSION:  PT End of Session - 08/04/23 0827     Visit Number 3    Number of Visits 12    Date for PT Re-Evaluation 09/06/23    Authorization Type MCR A and B    PT Start Time 0823   Pt arrived 21 mins late to treatment   PT Stop Time 0848    PT Time Calculation (min) 25 min    Activity Tolerance Patient tolerated treatment well    Behavior During Therapy [facility] for tasks assessed/performed              Past Medical History:  Diagnosis Date   Aneurysm of ascending aorta (HCC) 06/06/2023   Chronic combined systolic and diastolic CHF (congestive heart failure) (HCC)    Coronary atherosclerosis of native coronary artery    Dental crowns present    Essential hypertension, benign    states under control with meds., has been on med. x 15 yr.   GERD (gastroesophageal reflux disease)    Hepatitis    Drug induced hepatitis   MI (myocardial infarction) (HCC) 10/27/2002   Non-insulin dependent type 2 diabetes mellitus (HCC)    Obesity    Osteoarthritis    hips and knees   Pure hypercholesterolemia    Sleep apnea    Past Surgical History:  Procedure Laterality Date   ANKLE SURGERY Left    as a child   ANTERIOR CERVICAL DECOMP/DISCECTOMY FUSION Left 11/09/2021   Procedure: ANTERIOR CERVICAL DECOMPRESSION/DISCECTOMY FUSION 3 LEVELS C3-C6;  Surgeon: Venita Lick, MD;  Location: [facility];  Service: Orthopedics;  Laterality: Left;  4 hrs 3 C-Bed   ANTERIOR INTEROSSEOUS NERVE DECOMPRESSION Right 04/15/2021   Procedure: ANTERIOR INTEROSSEOUS NERVE DECOMPRESSION;  Surgeon: Dairl Ponder, MD;  Location: [facility];  Service: Orthopedics;  Laterality: Right;  Only need 1 hour for case,  Axillary block/Mac   CARDIAC CATHETERIZATION  10/27/2002   CARPAL TUNNEL RELEASE Right 11/21/2013   CARPAL TUNNEL RELEASE  Right 04/15/2021   Procedure: CARPAL TUNNEL RELEASE;  Surgeon: Dairl Ponder, MD;  Location: [facility];  Service: Orthopedics;  Laterality: Right;   COLONOSCOPY WITH PROPOFOL N/A 04/29/2023   Procedure: COLONOSCOPY WITH PROPOFOL;  Surgeon: Jeani Hawking, MD;  Location: [facility];  Service: Gastroenterology;  Laterality: N/A;   CORONARY STENT PLACEMENT  10/27/2002   mid LAD   TOTAL HIP ARTHROPLASTY Left 04/09/1999   TOTAL HIP ARTHROPLASTY Right    TOTAL KNEE ARTHROPLASTY Left 02/03/2011   TOTAL KNEE ARTHROPLASTY Right 05/11/2021   Procedure: TOTAL KNEE ARTHROPLASTY;  Surgeon: Ollen Gross, MD;  Location: [facility];  Service: Orthopedics;  Laterality: Right;   ULNAR TUNNEL RELEASE Left 06/18/2016   Procedure: LEFT CUBITAL TUNNEL RELEASE;  Surgeon: Dairl Ponder, MD;  Location: [facility];  Service: Orthopedics;  Laterality: Left;   Patient Active Problem List   Diagnosis Date Noted   Aneurysm of ascending aorta (HCC) 06/06/2023   Cervical myelopathy (HCC) 11/09/2021   OA (osteoarthritis) of knee 05/11/2021   Primary osteoarthritis of right knee 05/11/2021   Colon cancer screening 10/31/2020   Gastroesophageal reflux disease 10/31/2020   Morbid obesity (HCC) 10/31/2020   History of colonic polyps 10/31/2020   Pain of left hip joint 12/13/2019   AVNRT (AV nodal re-entry tachycardia) (HCC)    Withdrawal arrhythmia (HCC)    Cubital  tunnel syndrome, left 06/29/2016   CAD (coronary artery disease), native coronary artery 05/08/2014   Hyperlipidemia 03/21/2013    Class: Chronic   Old MI (myocardial infarction) 03/21/2013    Class: Chronic   DM (diabetes mellitus), type 2, uncontrolled 03/21/2013    Class: Chronic   Essential hypertension, benign 03/21/2013    Class: Chronic   Obesity (BMI 30-39.9) 03/21/2013   Chronic combined systolic and diastolic CHF (congestive heart failure) (HCC)      REFERRING PROVIDER: Doretha Sou, MD    REFERRING DIAG: [phone] (ICD-10-CM) - Other spondylosis with myelopathy, cervical region   THERAPY DIAG:  Muscle weakness (generalized)  Other abnormalities of gait and mobility  Cervicalgia  Stiffness of right shoulder, not elsewhere classified  Stiffness of left shoulder, not elsewhere classified  Rationale for Evaluation and Treatment: Rehabilitation  ONSET DATE: DOS  11/19/2022  SUBJECTIVE:                                                                                                                                                                                                         SUBJECTIVE STATEMENT: Pt denies any adverse effects after prior Rx.  He denies pain currently.    Hand dominance: Right  PERTINENT HISTORY:  CHF, MI, coronary artery disease with stent placement, HTN, tachycardia, and Aneurysm of ascending aorta   DM type 2 OA  PSHx: Cervical fusion C3-6 10/2021 and C1-C3 posterior spinal fusion 11/19/2022 Bilat THA (2000 and 2002) Bilat TKA  (2012 and 2022) R wrist CTR R Forearm/Anterior interrosseous nerve decompression 03/2021 L Ulnar tunnel release L ankle surgery (child)   PAIN:  NPRS:  0/10 current and best, 5-6/10 worst  Location:  R UT, R sided cervical Type:  Numbness in bilat feet, R > L and bilat hands.  Burning in L hand. Aggravating factors:  Looking to the Right for a prolonged time including looking at the computer screen. Easing factors:  having correct posture    PRECAUTIONS: Other: cervical fusion x 2, bilat THA, bilat TKA, aneurysm of ascending aorta     WEIGHT BEARING RESTRICTIONS: none indicated  FALLS:  Has patient fallen in last 6 months? 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    PLOF: Independent  Pt has been using the cane since  the 1st cervical fusion surgery.  Pt had better  quality of gait/ambulation prior to the 2nd cervical fusion.  PATIENT GOALS: improve strength and coordination.    OBJECTIVE:  Note: Objective measures were completed at Evaluation unless otherwise noted.  DIAGNOSTIC FINDINGS:    TREATMENT DATE:                                                                                                                                Opposition x10 bilat Standing rows with retraction x15 reps each with RTB and GTB Standing shoulder extension with RTB 2x15 Standing triceps extension 2x10 with RTB  Standing biceps curl  3# 3x10 Pulleys in flexion 3x15  Pt received a HEP handout and was educated in correct form and appropriate frequency.  PATIENT EDUCATION:  Education details:  relevant anatomy, POC, rationale of interventions, exercise form and HEP.  Person educated: Patient Education method: Explanation, demonstration, verbal and tactile cues, handout Education comprehension: verbalized understanding, returned demonstration, verbal and tactile cues required  HOME EXERCISE PROGRAM: Access Code: [email or web address] URL: [email or web address] Date: 08/04/2023 Prepared by: Aaron Edelman  Exercises - Thumb Opposition  - 2 x daily - 7 x weekly - 2-3 sets - 10 reps - Standing Shoulder Row with Anchored Resistance  - 1 x daily - 4-5 x weekly - 2 sets - 10-15 reps - Shoulder extension with resistance - Neutral  - 1 x daily - 4-5 x weekly - 2 sets - 10 reps  ASSESSMENT:  CLINICAL IMPRESSION: Treatment time limited today due to pt arriving late.  PT performed exercises to improve UE and postural strength and hand dexterity.  Pt performed exercises well with cuing and instruction in correct form.  PT established HEP and gave pt a HEP handout.  Pt demonstrates good understanding of HEP.  He responded well to Rx having no pain and no c/o's after Rx.   He should benefit from continued skilled PT services  to address impairments and to assist in restoring desired level of function.    OBJECTIVE IMPAIRMENTS: Abnormal gait, decreased activity tolerance, decreased balance, decreased endurance, decreased mobility, difficulty walking, decreased ROM, decreased strength, hypomobility, impaired flexibility, impaired UE functional use, postural dysfunction, and pain.   ACTIVITY LIMITATIONS: squatting, transfers, reach over head, and locomotion level  PARTICIPATION LIMITATIONS: cleaning, shopping, and community activity  PERSONAL FACTORS: 3+ comorbidities: bilat THA, bilat TKA, CHF, DM, OA  are also affecting patient's functional outcome.   REHAB POTENTIAL: Good  CLINICAL DECISION MAKING: Stable/uncomplicated  EVALUATION COMPLEXITY: Low   GOALS:   SHORT TERM GOALS: Target date: 08/15/23   Pt will be independent and compliant with HEP for improved strength, pain, and mobility.  Baseline:  Goal status: INITIAL  2.  Pt will demo at least 10-15 deg in bilat shoulder flexion AROM for improved reaching.  Baseline:  Goal status: INITIAL  3.  Pt will report at least a 25% improvement in his gait and balance.   Baseline:  Goal status: INITIAL  4.  Pt will tolerate postural/scapular strengthening without adverse effects for improved stability and posture with daily and work activities and to reduce stress on cervical.  Baseline:  Goal status: INITIAL    LONG TERM GOALS: Target date: 09/06/2023  Pt will demo improved bilat elbow extension strength to 5/5 and bilat hip abd strength by at least 5-8 lbs for improved performance of transfers, mobility, and gait.  Baseline:  Goal status: INITIAL  2.  Pt will report at least a 70% improvement in his gait and balance.  Baseline:  Goal status: INITIAL  3.  Pt will demo improved posture with gait and improved step length bilat. Baseline:  Goal status: INITIAL    PLAN:  PT FREQUENCY: 2x/week  PT DURATION: 6 weeks  PLANNED INTERVENTIONS:  97164- PT Re-evaluation, 97110-Therapeutic exercises, 97530- Therapeutic activity, [id number]- Neuromuscular re-education, 97535- Self Care, 86578- Manual therapy, [phone]- Gait training, [phone]- Aquatic Therapy, Patient/Family education, Balance training, Stair training, Taping, Dry Needling, Joint mobilization, Cryotherapy, and Moist heat  PLAN FOR NEXT SESSION: Postural/scapular strengthening with theraband, elbow strengthening, balance and gait.  Give HEP    Audie Clear III PT, DPT 08/04/23 10:22 PM

## 2023-08-06 IMAGING — MR MR CERVICAL SPINE W/O CM
5 of 6 series · 35 of 48 positions shown · non-contrast
Comparison: None.

CLINICAL DATA: Neck pain FEV.T (UBN-QH-CM)

DDD (degenerative disc disease), cervical 5N5.55 (UBN-QH-CM)
EXAM:
MRI CERVICAL SPINE WITHOUT CONTRAST
TECHNIQUE: Multiplanar, multisequence MR imaging of the cervical spine was
performed. No intravenous contrast was administered.

[Series 2: T2 · sagittal · 3.0mm · 0.82mm/px · 7 of 18 slices shown (1 of 3)]
[im 1/18]
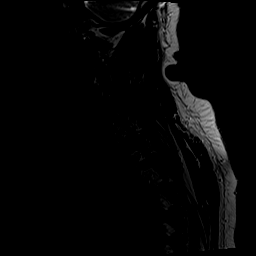
[im 3/18]
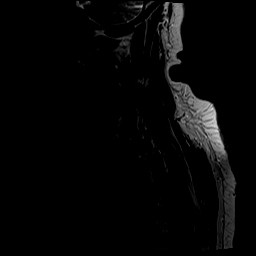
[im 6/18]
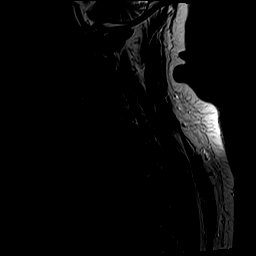
[im 9/18]
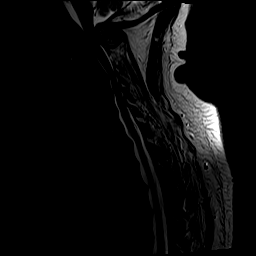
[im 12/18]
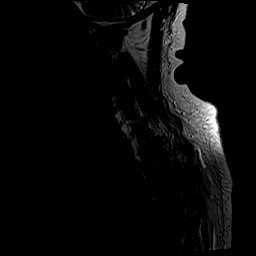
[im 15/18]
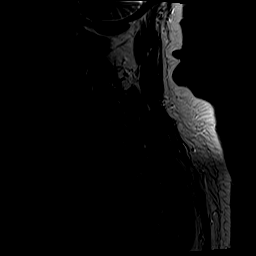
[im 18/18]
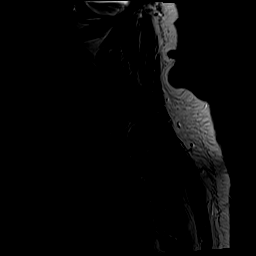

[Series 3: T1 · sagittal · 3.0mm · 0.41mm/px · 7 of 18 slices shown]
[im 1/18]
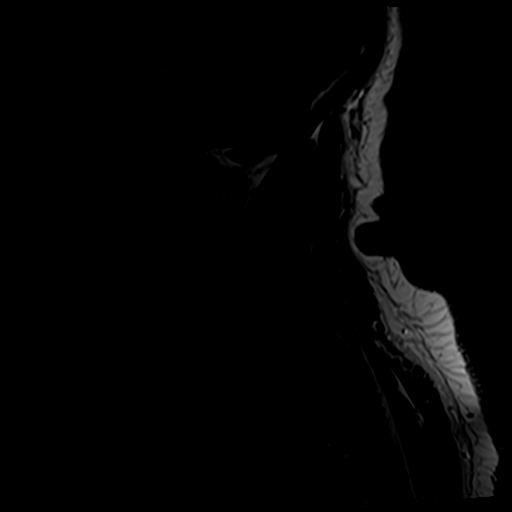
[im 3/18]
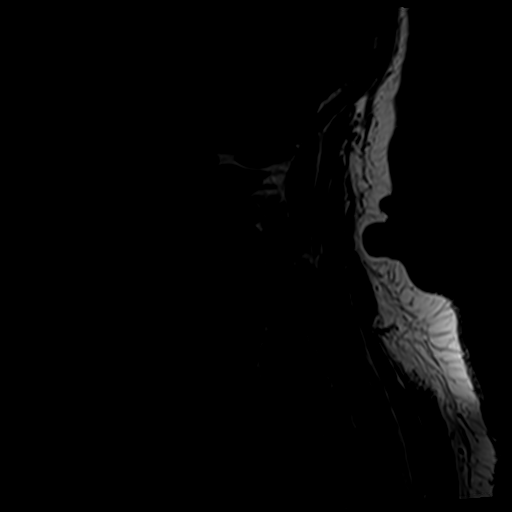
[im 6/18]
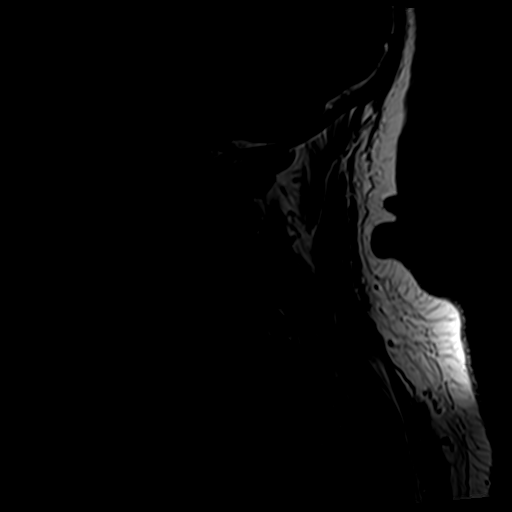
[im 9/18]
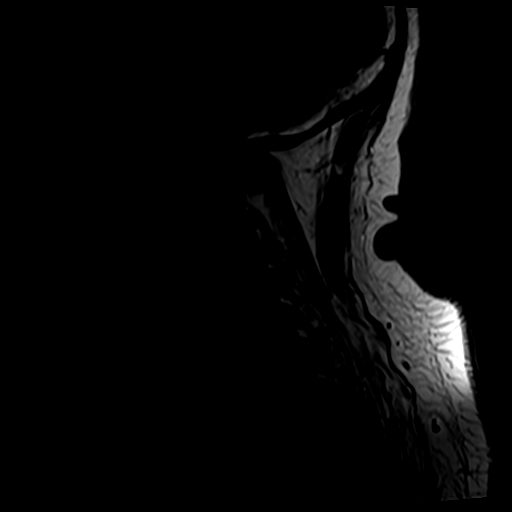
[im 12/18]
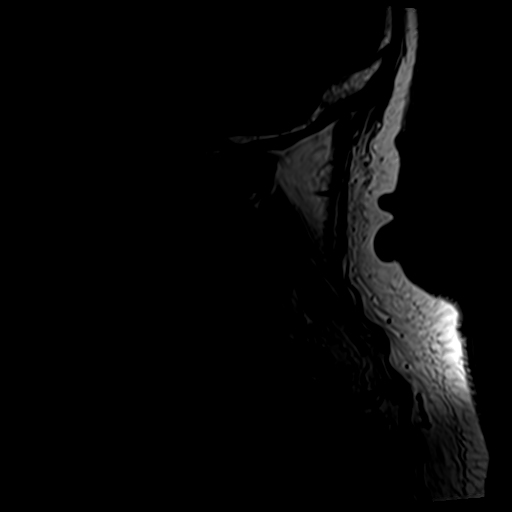
[im 15/18]
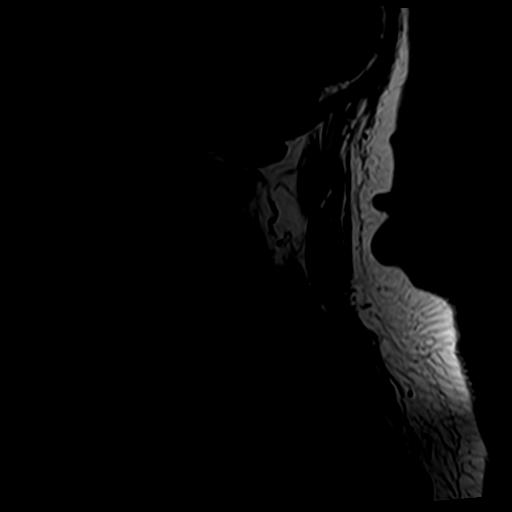
[im 18/18]
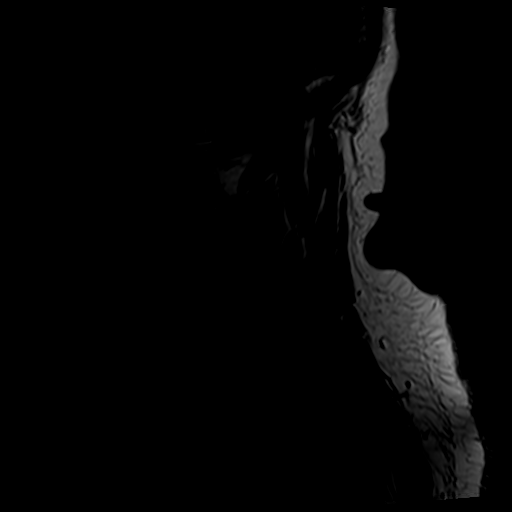

[Series 4: T2 · sagittal · 3.0mm · 0.82mm/px · 6 of 18 slices shown (2 of 3)]
[im 1/18]
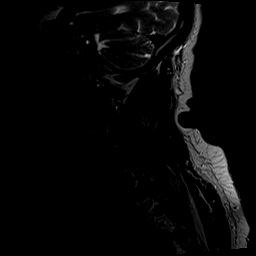
[im 4/18]
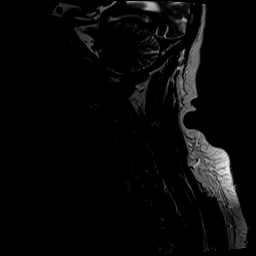
[im 7/18]
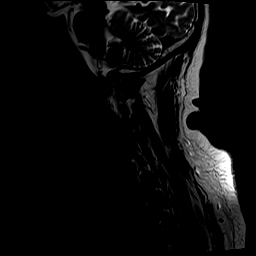
[im 11/18]
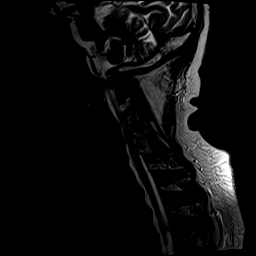
[im 14/18]
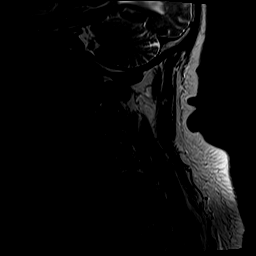
[im 18/18]
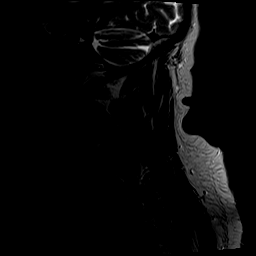

[Series 5: tir sag · sagittal · 3.0mm · 0.41mm/px · 4 of 18 slices shown]
[im 1/18]
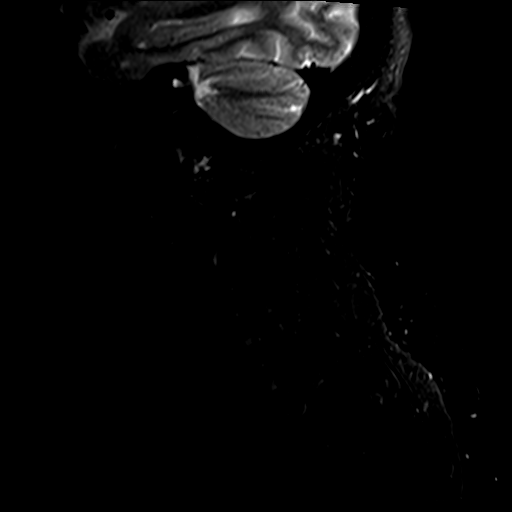
[im 4/18]
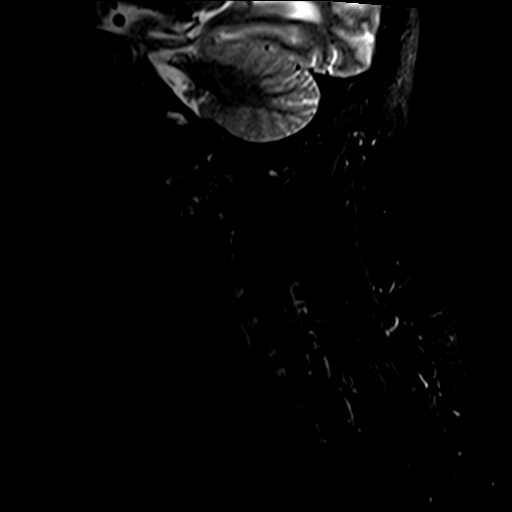
[im 7/18]
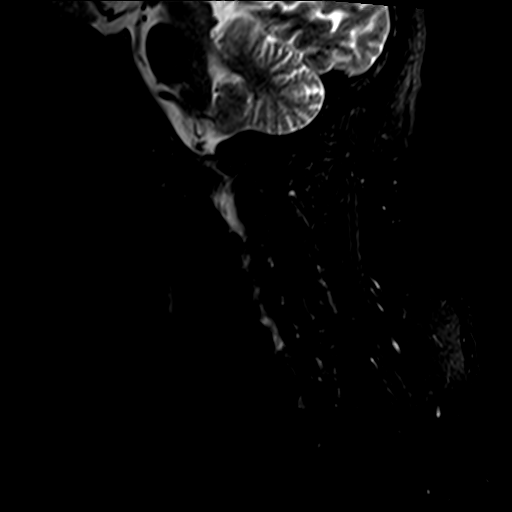
[im 11/18]
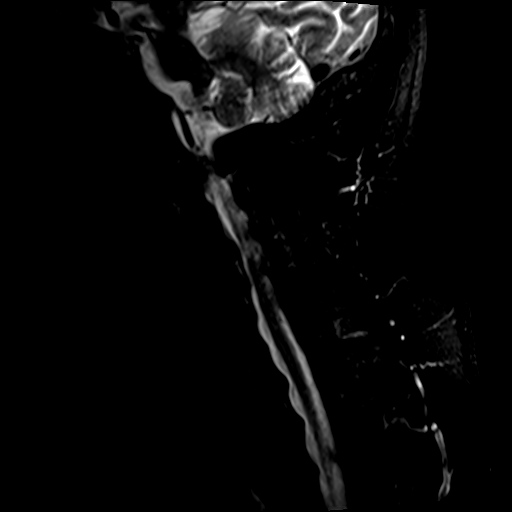

[Series 7: T2 · axial · 3.0mm · 0.78mm/px · z∈[-18,+95]mm · 11 of 33 slices shown (3 of 3)]
[im 1/33]
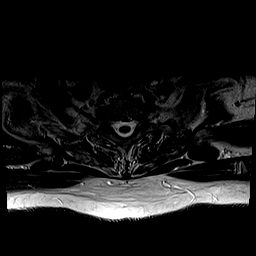
[im 4/33]
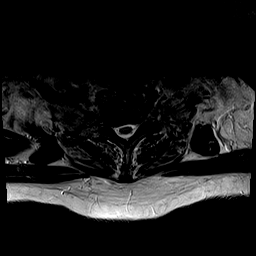
[im 7/33]
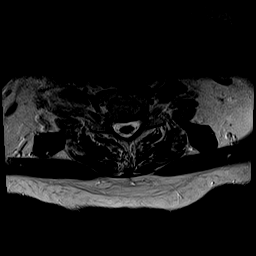
[im 10/33]
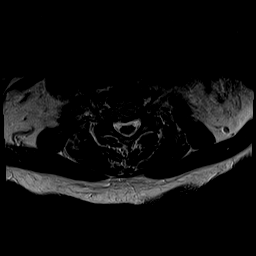
[im 13/33]
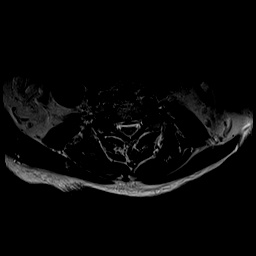
[im 17/33]
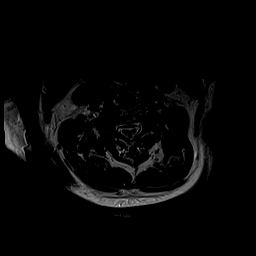
[im 20/33]
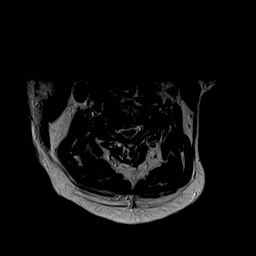
[im 23/33]
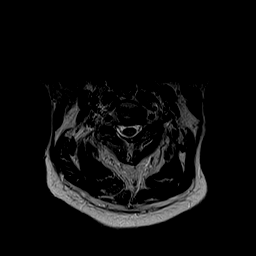
[im 26/33]
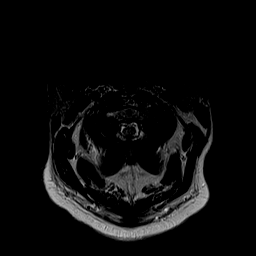
[im 29/33]
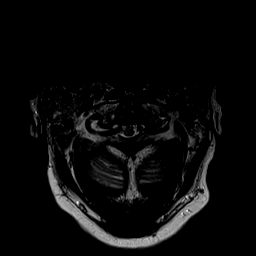
[im 33/33]
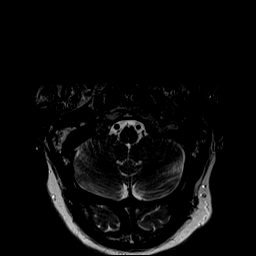

[35 of 48 positions shown; findings below may reference images not displayed]

FINDINGS: Alignment: Reversal of normal cervical lordosis. Grade 1
retrolisthesis at C4-5 and C5-6.

Vertebrae: No fracture, evidence of discitis, or bone lesion.

Cord: Focus of hyperintense T2-weighted signal within the spinal
cord at the C3-4 level

Posterior Fossa, vertebral arteries, paraspinal tissues: Normal

Disc levels:

C1-2: Unremarkable.

C2-3: Small left asymmetric disc bulge with uncovertebral spurring.
There is no spinal canal stenosis. Moderate left neural foraminal
stenosis.

C3-4: Medium-sized disc osteophyte complex. Severe spinal canal
stenosis. Severe bilateral neural foraminal stenosis.

C4-5: Small disc bulge with bilateral uncovertebral hypertrophy.
Mild spinal canal stenosis. Severe bilateral neural foraminal
stenosis.

C5-6: Small disc bulge, left asymmetric. There is no spinal canal
stenosis. Moderate right and severe left neural foraminal stenosis.

C6-7: Disc space narrowing. There is no spinal canal stenosis. No
neural foraminal stenosis.

C7-T1: Medium-sized disc bulge with endplate spurring, greater on
the right. There is no spinal canal stenosis. Moderate bilateral
neural foraminal stenosis.
IMPRESSION: 1. Severe spinal canal stenosis at C3-4 with signal changes of
myelomalacia.
2. Mild C4-5 spinal canal stenosis.
3. Multilevel moderate to severe neural foraminal stenosis, worst at
bilateral C3-4, bilateral C4-5 and left C5-6.

## 2023-08-08 ENCOUNTER — Ambulatory Visit (HOSPITAL_BASED_OUTPATIENT_CLINIC_OR_DEPARTMENT_OTHER): Admitting: Physical Therapy

## 2023-08-08 ENCOUNTER — Encounter (HOSPITAL_BASED_OUTPATIENT_CLINIC_OR_DEPARTMENT_OTHER): Payer: Self-pay | Admitting: Physical Therapy

## 2023-08-08 DIAGNOSIS — M6281 Muscle weakness (generalized): Secondary | ICD-10-CM

## 2023-08-08 DIAGNOSIS — M542 Cervicalgia: Secondary | ICD-10-CM

## 2023-08-08 DIAGNOSIS — R2689 Other abnormalities of gait and mobility: Secondary | ICD-10-CM

## 2023-08-08 DIAGNOSIS — M4712 Other spondylosis with myelopathy, cervical region: Secondary | ICD-10-CM | POA: Diagnosis not present

## 2023-08-08 NOTE — Therapy (Signed)
 OUTPATIENT PHYSICAL THERAPY CERVICAL TREATMENT   Patient Name: Ryan FERGER, MD MRN: [id number] DOB:1951/02/14, 73 y.o., male Today's Date: 08/08/2023  END OF SESSION:  PT End of Session - 08/08/23 0952     Visit Number 4    Number of Visits 12    Date for PT Re-Evaluation 09/06/23    Authorization Type MCR A and B    PT Start Time 0930    PT Stop Time 1012    PT Time Calculation (min) 42 min    Activity Tolerance Patient tolerated treatment well    Behavior During Therapy [facility] for tasks assessed/performed               Past Medical History:  Diagnosis Date   Aneurysm of ascending aorta (HCC) 06/06/2023   Chronic combined systolic and diastolic CHF (congestive heart failure) (HCC)    Coronary atherosclerosis of native coronary artery    Dental crowns present    Essential hypertension, benign    states under control with meds., has been on med. x 15 yr.   GERD (gastroesophageal reflux disease)    Hepatitis    Drug induced hepatitis   MI (myocardial infarction) (HCC) 10/27/2002   Non-insulin dependent type 2 diabetes mellitus (HCC)    Obesity    Osteoarthritis    hips and knees   Pure hypercholesterolemia    Sleep apnea    Past Surgical History:  Procedure Laterality Date   ANKLE SURGERY Left    as a child   ANTERIOR CERVICAL DECOMP/DISCECTOMY FUSION Left 11/09/2021   Procedure: ANTERIOR CERVICAL DECOMPRESSION/DISCECTOMY FUSION 3 LEVELS C3-C6;  Surgeon: Venita Lick, MD;  Location: [facility];  Service: Orthopedics;  Laterality: Left;  4 hrs 3 C-Bed   ANTERIOR INTEROSSEOUS NERVE DECOMPRESSION Right 04/15/2021   Procedure: ANTERIOR INTEROSSEOUS NERVE DECOMPRESSION;  Surgeon: Dairl Ponder, MD;  Location: [facility];  Service: Orthopedics;  Laterality: Right;  Only need 1 hour for case,  Axillary block/Mac   CARDIAC CATHETERIZATION  10/27/2002   CARPAL TUNNEL RELEASE Right 11/21/2013   CARPAL TUNNEL RELEASE Right 04/15/2021   Procedure: CARPAL  TUNNEL RELEASE;  Surgeon: Dairl Ponder, MD;  Location: [facility];  Service: Orthopedics;  Laterality: Right;   COLONOSCOPY WITH PROPOFOL N/A 04/29/2023   Procedure: COLONOSCOPY WITH PROPOFOL;  Surgeon: Jeani Hawking, MD;  Location: [facility];  Service: Gastroenterology;  Laterality: N/A;   CORONARY STENT PLACEMENT  10/27/2002   mid LAD   TOTAL HIP ARTHROPLASTY Left 04/09/1999   TOTAL HIP ARTHROPLASTY Right    TOTAL KNEE ARTHROPLASTY Left 02/03/2011   TOTAL KNEE ARTHROPLASTY Right 05/11/2021   Procedure: TOTAL KNEE ARTHROPLASTY;  Surgeon: Ollen Gross, MD;  Location: [facility];  Service: Orthopedics;  Laterality: Right;   ULNAR TUNNEL RELEASE Left 06/18/2016   Procedure: LEFT CUBITAL TUNNEL RELEASE;  Surgeon: Dairl Ponder, MD;  Location: [facility];  Service: Orthopedics;  Laterality: Left;   Patient Active Problem List   Diagnosis Date Noted   Aneurysm of ascending aorta (HCC) 06/06/2023   Cervical myelopathy (HCC) 11/09/2021   OA (osteoarthritis) of knee 05/11/2021   Primary osteoarthritis of right knee 05/11/2021   Colon cancer screening 10/31/2020   Gastroesophageal reflux disease 10/31/2020   Morbid obesity (HCC) 10/31/2020   History of colonic polyps 10/31/2020   Pain of left hip joint 12/13/2019   AVNRT (AV nodal re-entry tachycardia) (HCC)    Withdrawal arrhythmia (HCC)    Cubital tunnel syndrome, left 06/29/2016   CAD (  coronary artery disease), native coronary artery 05/08/2014   Hyperlipidemia 03/21/2013    Class: Chronic   Old MI (myocardial infarction) 03/21/2013    Class: Chronic   DM (diabetes mellitus), type 2, uncontrolled 03/21/2013    Class: Chronic   Essential hypertension, benign 03/21/2013    Class: Chronic   Obesity (BMI 30-39.9) 03/21/2013   Chronic combined systolic and diastolic CHF (congestive heart failure) (HCC)      REFERRING PROVIDER: Doretha Sou, MD   REFERRING DIAG: [id number] (ICD-10-CM) - Other  spondylosis with myelopathy, cervical region   THERAPY DIAG:  Muscle weakness (generalized)  Other abnormalities of gait and mobility  Cervicalgia  Rationale for Evaluation and Treatment: Rehabilitation  ONSET DATE: DOS  11/19/2022  SUBJECTIVE:                                                                                                                                                                                                         SUBJECTIVE STATEMENT: The patient reports the neck just gets a little sore at times. He has been working on his exercises some.   Hand dominance: Right  PERTINENT HISTORY:  CHF, MI, coronary artery disease with stent placement, HTN, tachycardia, and Aneurysm of ascending aorta   DM type 2 OA  PSHx: Cervical fusion C3-6 10/2021 and C1-C3 posterior spinal fusion 11/19/2022 Bilat THA (2000 and 2002) Bilat TKA  (2012 and 2022) R wrist CTR R Forearm/Anterior interrosseous nerve decompression 03/2021 L Ulnar tunnel release L ankle surgery (child)   PAIN:  NPRS:  0/10 current and best, 5-6/10 worst  Location:  R UT, R sided cervical Type:  Numbness in bilat feet, R > L and bilat hands.  Burning in L hand. Aggravating factors:  Looking to the Right for a prolonged time including looking at the computer screen. Easing factors:  having correct posture    PRECAUTIONS: Other: cervical fusion x 2, bilat THA, bilat TKA, aneurysm of ascending aorta     WEIGHT BEARING RESTRICTIONS: none indicated  FALLS:  Has patient fallen in last 6 months? 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    PLOF: Independent  Pt has been using the cane since the 1st cervical fusion surgery.  Pt had better quality of gait/ambulation prior to the 2nd cervical fusion.  PATIENT GOALS:  improve strength and coordination.  OBJECTIVE:  Note: Objective measures were completed at Evaluation unless otherwise noted.  DIAGNOSTIC FINDINGS:    TREATMENT DATE:                                                                                                                               Manual:  Trigger point release to cervical spine/ upper trap . Review of self release using the liba thera-cane  There-ex:  Gross grip putty 2x15  Key grip 2x10  Finger tips 2x10   Nero-re-ed:   Bilateral er yellow 2x10  Horizontal abduction 2x10  Narrow base eyes open 2x20 sec  Eyes closed 2x10   Tandem stance eyes closed       Last visit:  Opposition x10 bilat Standing rows with retraction x15 reps each with RTB and GTB Standing shoulder extension with RTB 2x15 Standing triceps extension 2x10 with RTB  Standing biceps curl  3# 3x10 Pulleys in flexion 3x15  Pt received a HEP handout and was educated in correct form and appropriate frequency.  PATIENT EDUCATION:  Education details:  relevant anatomy, POC, rationale of interventions, exercise form and HEP.  Person educated: Patient Education method: Explanation, demonstration, verbal and tactile cues, handout Education comprehension: verbalized understanding, returned demonstration, verbal and tactile cues required  HOME EXERCISE PROGRAM: Access Code: [email or web address] URL: [email or web address] Date: 08/04/2023 Prepared by: Aaron Edelman  Exercises - Thumb Opposition  - 2 x daily - 7 x weekly - 2-3 sets - 10 reps - Standing Shoulder Row with Anchored Resistance  - 1 x daily - 4-5 x weekly - 2 sets - 10-15 reps - Shoulder extension with resistance - Neutral  - 1 x daily - 4-5 x weekly - 2 sets - 10 reps  ASSESSMENT:  CLINICAL IMPRESSION: The patient tolerated treatment well. He continues to have spasming in his upper traps and into his cervical paraspinals. We reviewed self trigger point release using a  thera-cane. He reports hie will likely purchase one. We also reviewed a seated postural series. He had no significant pain. We initiated balance work today. We will progress him into a home program for balance   OBJECTIVE IMPAIRMENTS: Abnormal gait, decreased activity tolerance, decreased balance, decreased endurance, decreased mobility, difficulty walking, decreased ROM, decreased strength, hypomobility, impaired flexibility, impaired UE functional use, postural dysfunction, and pain.   ACTIVITY LIMITATIONS: squatting, transfers, reach over head, and locomotion level  PARTICIPATION LIMITATIONS: cleaning, shopping, and community activity  PERSONAL FACTORS: 3+ comorbidities: bilat THA, bilat TKA, CHF, DM, OA  are also affecting patient's functional outcome.   REHAB POTENTIAL: Good  CLINICAL DECISION MAKING: Stable/uncomplicated  EVALUATION COMPLEXITY: Low   GOALS:   SHORT TERM GOALS: Target date: 08/15/23   Pt will be independent and compliant with HEP for improved strength, pain, and mobility.  Baseline:  Goal status: INITIAL  2.  Pt will demo at least 10-15 deg in bilat shoulder flexion AROM for improved reaching.  Baseline:  Goal status:  INITIAL  3.  Pt will report at least a 25% improvement in his gait and balance.   Baseline:  Goal status: INITIAL  4.  Pt will tolerate postural/scapular strengthening without adverse effects for improved stability and posture with daily and work activities and to reduce stress on cervical.  Baseline:  Goal status: INITIAL    LONG TERM GOALS: Target date: 09/06/2023  Pt will demo improved bilat elbow extension strength to 5/5 and bilat hip abd strength by at least 5-8 lbs for improved performance of transfers, mobility, and gait.  Baseline:  Goal status: INITIAL  2.  Pt will report at least a 70% improvement in his gait and balance.  Baseline:  Goal status: INITIAL  3.  Pt will demo improved posture with gait and improved step  length bilat. Baseline:  Goal status: INITIAL    PLAN:  PT FREQUENCY: 2x/week  PT DURATION: 6 weeks  PLANNED INTERVENTIONS: 97164- PT Re-evaluation, 97110-Therapeutic exercises, 97530- Therapeutic activity, [id number]- Neuromuscular re-education, 97535- Self Care, 14782- Manual therapy, [phone]- Gait training, [phone]- Aquatic Therapy, Patient/Family education, Balance training, Stair training, Taping, Dry Needling, Joint mobilization, Cryotherapy, and Moist heat  PLAN FOR NEXT SESSION: Postural/scapular strengthening with theraband, elbow strengthening, balance and gait.  Give HEP    Audie Clear III PT, DPT 08/08/23 9:53 AM

## 2023-08-09 ENCOUNTER — Encounter (HOSPITAL_BASED_OUTPATIENT_CLINIC_OR_DEPARTMENT_OTHER): Payer: Self-pay | Admitting: Physical Therapy

## 2023-08-11 ENCOUNTER — Other Ambulatory Visit (HOSPITAL_COMMUNITY): Payer: Self-pay

## 2023-08-11 ENCOUNTER — Ambulatory Visit (HOSPITAL_BASED_OUTPATIENT_CLINIC_OR_DEPARTMENT_OTHER): Payer: Self-pay | Admitting: Physical Therapy

## 2023-08-11 DIAGNOSIS — R2689 Other abnormalities of gait and mobility: Secondary | ICD-10-CM

## 2023-08-11 DIAGNOSIS — M4712 Other spondylosis with myelopathy, cervical region: Secondary | ICD-10-CM | POA: Diagnosis not present

## 2023-08-11 DIAGNOSIS — M542 Cervicalgia: Secondary | ICD-10-CM

## 2023-08-11 DIAGNOSIS — M6281 Muscle weakness (generalized): Secondary | ICD-10-CM

## 2023-08-11 NOTE — Therapy (Signed)
 OUTPATIENT PHYSICAL THERAPY CERVICAL TREATMENT   Patient Name: Ryan TRULOCK, MD MRN: [id number] DOB:[birth year], 73 y.o., male Today's Date: 08/12/2023  END OF SESSION:  PT End of Session - 08/12/23 0836     Visit Number 5    Number of Visits 12    Date for PT Re-Evaluation 09/06/23    Authorization Type MCR A and B    PT Start Time 1230    PT Stop Time 1311    PT Time Calculation (min) 41 min    Activity Tolerance Patient tolerated treatment well    Behavior During Therapy [facility] for tasks assessed/performed                Past Medical History:  Diagnosis Date   Aneurysm of ascending aorta (HCC) 06/06/2023   Chronic combined systolic and diastolic CHF (congestive heart failure) (HCC)    Coronary atherosclerosis of native coronary artery    Dental crowns present    Essential hypertension, benign    states under control with meds., has been on med. x 15 yr.   GERD (gastroesophageal reflux disease)    Hepatitis    Drug induced hepatitis   MI (myocardial infarction) (HCC) 10/27/2002   Non-insulin dependent type 2 diabetes mellitus (HCC)    Obesity    Osteoarthritis    hips and knees   Pure hypercholesterolemia    Sleep apnea    Past Surgical History:  Procedure Laterality Date   ANKLE SURGERY Left    as a child   ANTERIOR CERVICAL DECOMP/DISCECTOMY FUSION Left 11/09/2021   Procedure: ANTERIOR CERVICAL DECOMPRESSION/DISCECTOMY FUSION 3 LEVELS C3-C6;  Surgeon: Venita Lick, MD;  Location: [facility];  Service: Orthopedics;  Laterality: Left;  4 hrs 3 C-Bed   ANTERIOR INTEROSSEOUS NERVE DECOMPRESSION Right 04/15/2021   Procedure: ANTERIOR INTEROSSEOUS NERVE DECOMPRESSION;  Surgeon: Dairl Ponder, MD;  Location: [facility];  Service: Orthopedics;  Laterality: Right;  Only need 1 hour for case,  Axillary block/Mac   CARDIAC CATHETERIZATION  10/27/2002   CARPAL TUNNEL RELEASE Right 11/21/2013   CARPAL TUNNEL RELEASE Right 04/15/2021   Procedure:  CARPAL TUNNEL RELEASE;  Surgeon: Dairl Ponder, MD;  Location: [facility];  Service: Orthopedics;  Laterality: Right;   COLONOSCOPY WITH PROPOFOL N/A 04/29/2023   Procedure: COLONOSCOPY WITH PROPOFOL;  Surgeon: Jeani Hawking, MD;  Location: [facility];  Service: Gastroenterology;  Laterality: N/A;   CORONARY STENT PLACEMENT  10/27/2002   mid LAD   TOTAL HIP ARTHROPLASTY Left 04/09/1999   TOTAL HIP ARTHROPLASTY Right    TOTAL KNEE ARTHROPLASTY Left 02/03/2011   TOTAL KNEE ARTHROPLASTY Right 05/11/2021   Procedure: TOTAL KNEE ARTHROPLASTY;  Surgeon: Ollen Gross, MD;  Location: [facility];  Service: Orthopedics;  Laterality: Right;   ULNAR TUNNEL RELEASE Left 06/18/2016   Procedure: LEFT CUBITAL TUNNEL RELEASE;  Surgeon: Dairl Ponder, MD;  Location: [facility];  Service: Orthopedics;  Laterality: Left;   Patient Active Problem List   Diagnosis Date Noted   Aneurysm of ascending aorta (HCC) 06/06/2023   Cervical myelopathy (HCC) 11/09/2021   OA (osteoarthritis) of knee 05/11/2021   Primary osteoarthritis of right knee 05/11/2021   Colon cancer screening 10/31/2020   Gastroesophageal reflux disease 10/31/2020   Morbid obesity (HCC) 10/31/2020   History of colonic polyps 10/31/2020   Pain of left hip joint 12/13/2019   AVNRT (AV nodal re-entry tachycardia) (HCC)    Withdrawal arrhythmia (HCC)    Cubital tunnel syndrome, left 06/29/2016  CAD (coronary artery disease), native coronary artery 05/08/2014   Hyperlipidemia 03/21/2013    Class: Chronic   Old MI (myocardial infarction) 03/21/2013    Class: Chronic   DM (diabetes mellitus), type 2, uncontrolled 03/21/2013    Class: Chronic   Essential hypertension, benign 03/21/2013    Class: Chronic   Obesity (BMI 30-39.9) 03/21/2013   Chronic combined systolic and diastolic CHF (congestive heart failure) (HCC)      REFERRING PROVIDER: Doretha Sou, MD   REFERRING DIAG: [phone] (ICD-10-CM) -  Other spondylosis with myelopathy, cervical region   THERAPY DIAG:  Muscle weakness (generalized)  Other abnormalities of gait and mobility  Cervicalgia  Rationale for Evaluation and Treatment: Rehabilitation  ONSET DATE: DOS  11/19/2022  SUBJECTIVE:                                                                                                                                                                                                         SUBJECTIVE STATEMENT: 3/20: Pt reported feeling good today. Reported neck still feels sore but overall is doing better.    The patient reports the neck just gets a little sore at times. He has been working on his exercises some.   Hand dominance: Right  PERTINENT HISTORY:  CHF, MI, coronary artery disease with stent placement, HTN, tachycardia, and Aneurysm of ascending aorta   DM type 2 OA  PSHx: Cervical fusion C3-6 10/2021 and C1-C3 posterior spinal fusion 11/19/2022 Bilat THA (2000 and 2002) Bilat TKA  (2012 and 2022) R wrist CTR R Forearm/Anterior interrosseous nerve decompression 03/2021 L Ulnar tunnel release L ankle surgery (child)   PAIN:  NPRS:  0/10 current and best, 5-6/10 worst  Location:  R UT, R sided cervical Type:  Numbness in bilat feet, R > L and bilat hands.  Burning in L hand. Aggravating factors:  Looking to the Right for a prolonged time including looking at the computer screen. Easing factors:  having correct posture    PRECAUTIONS: Other: cervical fusion x 2, bilat THA, bilat TKA, aneurysm of ascending aorta     WEIGHT BEARING RESTRICTIONS: none indicated  FALLS:  Has patient fallen in last 6 months? 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    PLOF: Independent  Pt has been using the cane since the 1st cervical  fusion surgery.  Pt had better quality of gait/ambulation prior to the 2nd cervical fusion.  PATIENT GOALS: improve strength and coordination.    OBJECTIVE:  Note: Objective measures were completed at Evaluation unless otherwise noted.  DIAGNOSTIC FINDINGS:    TREATMENT DATE:        3/20: Manual: STM and trigger point release to suboccipitals, paraspinals, and upper traps There-ex: Supine shoulder FLEX with cane no weight 2x15 RPE 3 Shoulder shrugs 2x10  There-Act  Self Care  Nuro-Re-ed                   Horizontal ABD with yellow RTB 2x12 RPE 4 Banded rows 2x10 yellow RTB                                                                                                        Manual:  Trigger point release to cervical spine/ upper trap . Review of self release using the liba thera-cane  There-ex:  Gross grip putty 2x15  Key grip 2x10  Finger tips 2x10   Nero-re-ed:   Bilateral er yellow 2x10  Horizontal abduction 2x10  Narrow base eyes open 2x20 sec  Eyes closed 2x10   Tandem stance eyes closed       Last visit:  Opposition x10 bilat Standing rows with retraction x15 reps each with RTB and GTB Standing shoulder extension with RTB 2x15 Standing triceps extension 2x10 with RTB  Standing biceps curl  3# 3x10 Pulleys in flexion 3x15  Pt received a HEP handout and was educated in correct form and appropriate frequency.  PATIENT EDUCATION:  Education details:  relevant anatomy, POC, rationale of interventions, exercise form and HEP.  Person educated: Patient Education method: Explanation, demonstration, verbal and tactile cues, handout Education comprehension: verbalized understanding, returned demonstration, verbal and tactile cues required  HOME EXERCISE PROGRAM: Access Code: [email or web address] URL: [email or web address] Date: 08/04/2023 Prepared by: Aaron Edelman  Exercises - Thumb Opposition  - 2 x daily - 7 x weekly - 2-3 sets - 10 reps -  Standing Shoulder Row with Anchored Resistance  - 1 x daily - 4-5 x weekly - 2 sets - 10-15 reps - Shoulder extension with resistance - Neutral  - 1 x daily - 4-5 x weekly - 2 sets - 10 reps  ASSESSMENT:  CLINICAL IMPRESSION: The patient continues to tolerate treatment well. He had no significant increase in pain. We expanded his HEP. He was advised to continue with his self soft ttissue mobilization and exercises.    OBJECTIVE IMPAIRMENTS: Abnormal gait, decreased activity tolerance, decreased balance, decreased endurance, decreased mobility, difficulty walking, decreased ROM, decreased strength, hypomobility, impaired flexibility, impaired UE functional use, postural dysfunction, and pain.   ACTIVITY LIMITATIONS: squatting, transfers, reach over head, and locomotion level  PARTICIPATION LIMITATIONS: cleaning, shopping, and community activity  PERSONAL FACTORS: 3+ comorbidities: bilat THA, bilat TKA, CHF, DM, OA  are also affecting patient's functional outcome.   REHAB POTENTIAL: Good  CLINICAL DECISION MAKING: Stable/uncomplicated  EVALUATION COMPLEXITY: Low   GOALS:   SHORT TERM GOALS: Target date: 08/15/23   Pt will  be independent and compliant with HEP for improved strength, pain, and mobility.  Baseline:  Goal status: INITIAL  2.  Pt will demo at least 10-15 deg in bilat shoulder flexion AROM for improved reaching.  Baseline:  Goal status: INITIAL  3.  Pt will report at least a 25% improvement in his gait and balance.   Baseline:  Goal status: INITIAL  4.  Pt will tolerate postural/scapular strengthening without adverse effects for improved stability and posture with daily and work activities and to reduce stress on cervical.  Baseline:  Goal status: INITIAL    LONG TERM GOALS: Target date: 09/06/2023  Pt will demo improved bilat elbow extension strength to 5/5 and bilat hip abd strength by at least 5-8 lbs for improved performance of transfers, mobility, and  gait.  Baseline:  Goal status: INITIAL  2.  Pt will report at least a 70% improvement in his gait and balance.  Baseline:  Goal status: INITIAL  3.  Pt will demo improved posture with gait and improved step length bilat. Baseline:  Goal status: INITIAL    PLAN:  PT FREQUENCY: 2x/week  PT DURATION: 6 weeks  PLANNED INTERVENTIONS: 97164- PT Re-evaluation, 97110-Therapeutic exercises, 97530- Therapeutic activity, [id number]- Neuromuscular re-education, 97535- Self Care, [id number]- Manual therapy, [id number]- Gait training, [phone]- Aquatic Therapy, Patient/Family education, Balance training, Stair training, Taping, Dry Needling, Joint mobilization, Cryotherapy, and Moist heat  PLAN FOR NEXT SESSION: Postural/scapular strengthening with theraband, elbow strengthening, balance and gait.  Give HEP    Audie Clear III PT, DPT 08/12/23 8:37 AM

## 2023-08-12 ENCOUNTER — Encounter (HOSPITAL_BASED_OUTPATIENT_CLINIC_OR_DEPARTMENT_OTHER): Payer: Self-pay | Admitting: Physical Therapy

## 2023-08-17 ENCOUNTER — Encounter (HOSPITAL_BASED_OUTPATIENT_CLINIC_OR_DEPARTMENT_OTHER): Payer: Self-pay | Admitting: Physical Therapy

## 2023-08-17 ENCOUNTER — Ambulatory Visit (HOSPITAL_BASED_OUTPATIENT_CLINIC_OR_DEPARTMENT_OTHER): Payer: Self-pay | Admitting: Physical Therapy

## 2023-08-17 DIAGNOSIS — M25612 Stiffness of left shoulder, not elsewhere classified: Secondary | ICD-10-CM

## 2023-08-17 DIAGNOSIS — M6281 Muscle weakness (generalized): Secondary | ICD-10-CM

## 2023-08-17 DIAGNOSIS — M542 Cervicalgia: Secondary | ICD-10-CM

## 2023-08-17 DIAGNOSIS — M25611 Stiffness of right shoulder, not elsewhere classified: Secondary | ICD-10-CM

## 2023-08-17 DIAGNOSIS — R2689 Other abnormalities of gait and mobility: Secondary | ICD-10-CM

## 2023-08-17 DIAGNOSIS — M4712 Other spondylosis with myelopathy, cervical region: Secondary | ICD-10-CM | POA: Diagnosis not present

## 2023-08-17 NOTE — Therapy (Signed)
 OUTPATIENT PHYSICAL THERAPY CERVICAL TREATMENT   Patient Name: Ryan HANTON, MD MRN: [id number] DOB:February 17, 1951, 73 y.o., male Today's Date: 08/17/2023  END OF SESSION:  PT End of Session - 08/17/23 0723     Visit Number 6    Number of Visits 12    PT Start Time 0720    PT Stop Time 0757    PT Time Calculation (min) 37 min    Activity Tolerance Patient tolerated treatment well;No increased pain    Behavior During Therapy [facility] for tasks assessed/performed                 Past Medical History:  Diagnosis Date   Aneurysm of ascending aorta (HCC) 06/06/2023   Chronic combined systolic and diastolic CHF (congestive heart failure) (HCC)    Coronary atherosclerosis of native coronary artery    Dental crowns present    Essential hypertension, benign    states under control with meds., has been on med. x 15 yr.   GERD (gastroesophageal reflux disease)    Hepatitis    Drug induced hepatitis   MI (myocardial infarction) (HCC) 10/27/2002   Non-insulin dependent type 2 diabetes mellitus (HCC)    Obesity    Osteoarthritis    hips and knees   Pure hypercholesterolemia    Sleep apnea    Past Surgical History:  Procedure Laterality Date   ANKLE SURGERY Left    as a child   ANTERIOR CERVICAL DECOMP/DISCECTOMY FUSION Left 11/09/2021   Procedure: ANTERIOR CERVICAL DECOMPRESSION/DISCECTOMY FUSION 3 LEVELS C3-C6;  Surgeon: Venita Lick, MD;  Location: [facility];  Service: Orthopedics;  Laterality: Left;  4 hrs 3 C-Bed   ANTERIOR INTEROSSEOUS NERVE DECOMPRESSION Right 04/15/2021   Procedure: ANTERIOR INTEROSSEOUS NERVE DECOMPRESSION;  Surgeon: Dairl Ponder, MD;  Location: [facility];  Service: Orthopedics;  Laterality: Right;  Only need 1 hour for case,  Axillary block/Mac   CARDIAC CATHETERIZATION  10/27/2002   CARPAL TUNNEL RELEASE Right 11/21/2013   CARPAL TUNNEL RELEASE Right 04/15/2021   Procedure: CARPAL TUNNEL RELEASE;  Surgeon: Dairl Ponder, MD;   Location: [facility];  Service: Orthopedics;  Laterality: Right;   COLONOSCOPY WITH PROPOFOL N/A 04/29/2023   Procedure: COLONOSCOPY WITH PROPOFOL;  Surgeon: Jeani Hawking, MD;  Location: [facility];  Service: Gastroenterology;  Laterality: N/A;   CORONARY STENT PLACEMENT  10/27/2002   mid LAD   TOTAL HIP ARTHROPLASTY Left 04/09/1999   TOTAL HIP ARTHROPLASTY Right    TOTAL KNEE ARTHROPLASTY Left 02/03/2011   TOTAL KNEE ARTHROPLASTY Right 05/11/2021   Procedure: TOTAL KNEE ARTHROPLASTY;  Surgeon: Ollen Gross, MD;  Location: [facility];  Service: Orthopedics;  Laterality: Right;   ULNAR TUNNEL RELEASE Left 06/18/2016   Procedure: LEFT CUBITAL TUNNEL RELEASE;  Surgeon: Dairl Ponder, MD;  Location: [facility];  Service: Orthopedics;  Laterality: Left;   Patient Active Problem List   Diagnosis Date Noted   Aneurysm of ascending aorta (HCC) 06/06/2023   Cervical myelopathy (HCC) 11/09/2021   OA (osteoarthritis) of knee 05/11/2021   Primary osteoarthritis of right knee 05/11/2021   Colon cancer screening 10/31/2020   Gastroesophageal reflux disease 10/31/2020   Morbid obesity (HCC) 10/31/2020   History of colonic polyps 10/31/2020   Pain of left hip joint 12/13/2019   AVNRT (AV nodal re-entry tachycardia) (HCC)    Withdrawal arrhythmia (HCC)    Cubital tunnel syndrome, left 06/29/2016   CAD (coronary artery disease), native coronary artery 05/08/2014   Hyperlipidemia 03/21/2013  Class: Chronic   Old MI (myocardial infarction) 03/21/2013    Class: Chronic   DM (diabetes mellitus), type 2, uncontrolled 03/21/2013    Class: Chronic   Essential hypertension, benign 03/21/2013    Class: Chronic   Obesity (BMI 30-39.9) 03/21/2013   Chronic combined systolic and diastolic CHF (congestive heart failure) (HCC)      REFERRING PROVIDER: Doretha Sou, MD   REFERRING DIAG: [phone] (ICD-10-CM) - Other spondylosis with myelopathy, cervical region    THERAPY DIAG:  Muscle weakness (generalized)  Other abnormalities of gait and mobility  Cervicalgia  Stiffness of left shoulder, not elsewhere classified  Stiffness of right shoulder, not elsewhere classified  Rationale for Evaluation and Treatment: Rehabilitation  ONSET DATE: DOS  11/19/2022  SUBJECTIVE:                                                                                                                                                                                                         SUBJECTIVE STATEMENT: 3/26 Pt reported no pain in neck this morning. Did some cleaning around the house and felt no increased pain or stiffness. Pt said functional activities done last time (reaching behind head, behind back) were really good and helped with his ROM.     Hand dominance: Right  PERTINENT HISTORY:  CHF, MI, coronary artery disease with stent placement, HTN, tachycardia, and Aneurysm of ascending aorta   DM type 2 OA  PSHx: Cervical fusion C3-6 10/2021 and C1-C3 posterior spinal fusion 11/19/2022 Bilat THA (2000 and 2002) Bilat TKA  (2012 and 2022) R wrist CTR R Forearm/Anterior interrosseous nerve decompression 03/2021 L Ulnar tunnel release L ankle surgery (child)   PAIN:  NPRS:  0/10 current and best, 5-6/10 worst  Location:  R UT, R sided cervical Type:  Numbness in bilat feet, R > L and bilat hands.  Burning in L hand. Aggravating factors:  Looking to the Right for a prolonged time including looking at the computer screen. Easing factors:  having correct posture    PRECAUTIONS: Other: cervical fusion x 2, bilat THA, bilat TKA, aneurysm of ascending aorta     WEIGHT BEARING RESTRICTIONS: none indicated  FALLS:  Has patient fallen in last 6 months? 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  He is  not performing surgeries.    PLOF: Independent  Pt has been using the cane since  the 1st cervical fusion surgery.  Pt had better quality of gait/ambulation prior to the 2nd cervical fusion.  PATIENT GOALS: improve strength and coordination.    OBJECTIVE:  Note: Objective measures were completed at Evaluation unless otherwise noted.  DIAGNOSTIC FINDINGS:    Active ROM A/PROM (deg) eval 08/17/23  Flexion 13 62  Extension 32 25  Right lateral flexion     Left lateral flexion     Right rotation 22 26  Left rotation 20 30   (Blank rows = not tested) Pt had no pain with cervical AROM  TREATMENT DATE:        3/26: Manual: All PROM performed with distraction to reduce pain and improve movement STM and trigger point release to suboccipitals, paraspinals, and upper traps There-ex: Supine shoulder FLEX with cane no weight 2x15 RPE 3 Shoulder shrugs 3x10 4 lbs RPE 5 There-Act Reaching behind head 2x10 Reaching up 2x10 Reaching behind back 2x10 Neuro-Re-ed  Banded rows 2x10 red RTB RPE 5 Banded pulldowns 3x10 red RTB RPE 5       3/20: Manual: STM and trigger point release to suboccipitals, paraspinals, and upper traps There-ex: Supine shoulder FLEX with cane no weight 2x15 RPE 3 Shoulder shrugs 2x10  There-Act  Self Care  Nuro-Re-ed                   Horizontal ABD with yellow RTB 2x12 RPE 4 Banded rows 2x10 yellow RTB                                                                                                        Manual:  Trigger point release to cervical spine/ upper trap . Review of self release using the liba thera-cane  There-ex:  Gross grip putty 2x15  Key grip 2x10  Finger tips 2x10   Nero-re-ed:   Bilateral er yellow 2x10  Horizontal abduction 2x10  Narrow base eyes open 2x20 sec  Eyes closed 2x10   Tandem stance eyes closed       Last visit:  Opposition x10 bilat Standing rows with retraction x15 reps each with RTB and GTB Standing  shoulder extension with RTB 2x15 Standing triceps extension 2x10 with RTB  Standing biceps curl  3# 3x10 Pulleys in flexion 3x15  Pt received a HEP handout and was educated in correct form and appropriate frequency.  PATIENT EDUCATION:  Education details:  relevant anatomy, POC, rationale of interventions, exercise form and HEP.  Person educated: Patient Education method: Explanation, demonstration, verbal and tactile cues, handout Education comprehension: verbalized understanding, returned demonstration, verbal and tactile cues required  HOME EXERCISE PROGRAM: Access Code: [email or web address] URL: [email or web address] Date: 08/04/2023 Prepared by: Aaron Edelman  Exercises - Thumb Opposition  - 2 x daily - 7 x weekly - 2-3 sets - 10 reps - Standing Shoulder Row with Anchored Resistance  - 1 x daily - 4-5 x weekly - 2 sets - 10-15 reps - Shoulder extension with resistance - Neutral  - 1 x daily - 4-5 x weekly - 2 sets - 10 reps  ASSESSMENT:  CLINICAL IMPRESSION: 3/26: Pt warmed up on the AAROM ropes. Proceeded to do STM to cervical region, UT, paraspinals which were tolerated well. Worked on trigger point in L UT and it was reduced and pt verbally said it felt better. Moved on to therapeutic exercise banded rows. Pt progressed to red band and said tolerated it well. Moved on to functional movements which he said have been really helpful. Did bilateral for all movements with verbal and tactile cues. Pt will continue to benefit from skilled physical therapy for improvements in functional ROM for household tasks.    OBJECTIVE IMPAIRMENTS: Abnormal gait, decreased activity tolerance, decreased balance, decreased endurance, decreased mobility, difficulty walking, decreased ROM, decreased strength, hypomobility, impaired flexibility, impaired UE functional use, postural dysfunction, and pain.   ACTIVITY LIMITATIONS: squatting, transfers, reach over head, and locomotion  level  PARTICIPATION LIMITATIONS: cleaning, shopping, and community activity  PERSONAL FACTORS: 3+ comorbidities: bilat THA, bilat TKA, CHF, DM, OA  are also affecting patient's functional outcome.   REHAB POTENTIAL: Good  CLINICAL DECISION MAKING: Stable/uncomplicated  EVALUATION COMPLEXITY: Low   GOALS:   SHORT TERM GOALS: Target date: 08/15/23   Pt will be independent and compliant with HEP for improved strength, pain, and mobility.  Baseline:  Goal status: 3/26 Pt met goal  2.  Pt will demo at least 10-15 deg in bilat shoulder flexion AROM for improved reaching.  Baseline:  Goal status: 3/26 Pt met goal  3.  Pt will report at least a 25% improvement in his gait and balance.   Baseline:  Goal status: 3/26 Pt met goal  4.  Pt will tolerate postural/scapular strengthening without adverse effects for improved stability and posture with daily and work activities and to reduce stress on cervical.  Baseline:  Goal status: 3/26 Pt met goal     LONG TERM GOALS: Target date: 09/06/2023  Pt will demo improved bilat elbow extension strength to 5/5 and bilat hip abd strength by at least 5-8 lbs for improved performance of transfers, mobility, and gait.  Baseline:  Goal status: INITIAL  2.  Pt will report at least a 70% improvement in his gait and balance.  Baseline:  Goal status: INITIAL  3.  Pt will demo improved posture with gait and improved step length bilat. Baseline:  Goal status: INITIAL    PLAN:  PT FREQUENCY: 2x/week  PT DURATION: 6 weeks  PLANNED INTERVENTIONS: 97164- PT Re-evaluation, 97110-Therapeutic exercises, 97530- Therapeutic activity, [id number]- Neuromuscular re-education, 97535- Self Care, 16109- Manual therapy, [phone]- Gait training, [phone]- Aquatic Therapy, Patient/Family education, Balance training, Stair training, Taping, Dry Needling, Joint mobilization, Cryotherapy, and Moist heat  PLAN FOR NEXT SESSION: Postural/scapular strengthening with  theraband, elbow strengthening, balance and gait.  Give HEP    Landry Corporal SPT 08/17/23 8:55 AM

## 2023-08-19 ENCOUNTER — Ambulatory Visit (HOSPITAL_BASED_OUTPATIENT_CLINIC_OR_DEPARTMENT_OTHER): Admitting: Physical Therapy

## 2023-08-19 ENCOUNTER — Encounter (HOSPITAL_BASED_OUTPATIENT_CLINIC_OR_DEPARTMENT_OTHER): Payer: Self-pay | Admitting: Physical Therapy

## 2023-08-19 DIAGNOSIS — M25611 Stiffness of right shoulder, not elsewhere classified: Secondary | ICD-10-CM

## 2023-08-19 DIAGNOSIS — M542 Cervicalgia: Secondary | ICD-10-CM

## 2023-08-19 DIAGNOSIS — M4712 Other spondylosis with myelopathy, cervical region: Secondary | ICD-10-CM | POA: Diagnosis not present

## 2023-08-19 DIAGNOSIS — M25612 Stiffness of left shoulder, not elsewhere classified: Secondary | ICD-10-CM

## 2023-08-19 DIAGNOSIS — M6281 Muscle weakness (generalized): Secondary | ICD-10-CM

## 2023-08-19 DIAGNOSIS — R2689 Other abnormalities of gait and mobility: Secondary | ICD-10-CM

## 2023-08-19 NOTE — Therapy (Signed)
 OUTPATIENT PHYSICAL THERAPY CERVICAL TREATMENT   Patient Name: Ryan PERRELL, MD MRN: [id number] DOB:03-03-1951, 73 y.o., male Today's Date: 08/19/2023  END OF SESSION:  PT End of Session - 08/19/23 1037     Visit Number 7    Number of Visits 12    Date for PT Re-Evaluation 09/06/23    Authorization Type MCR A and B    PT Start Time 1033    PT Stop Time 1113    PT Time Calculation (min) 40 min    Activity Tolerance Patient tolerated treatment well;No increased pain    Behavior During Therapy [facility] for tasks assessed/performed                 Past Medical History:  Diagnosis Date   Aneurysm of ascending aorta (HCC) 06/06/2023   Chronic combined systolic and diastolic CHF (congestive heart failure) (HCC)    Coronary atherosclerosis of native coronary artery    Dental crowns present    Essential hypertension, benign    states under control with meds., has been on med. x 15 yr.   GERD (gastroesophageal reflux disease)    Hepatitis    Drug induced hepatitis   MI (myocardial infarction) (HCC) 10/27/2002   Non-insulin dependent type 2 diabetes mellitus (HCC)    Obesity    Osteoarthritis    hips and knees   Pure hypercholesterolemia    Sleep apnea    Past Surgical History:  Procedure Laterality Date   ANKLE SURGERY Left    as a child   ANTERIOR CERVICAL DECOMP/DISCECTOMY FUSION Left 11/09/2021   Procedure: ANTERIOR CERVICAL DECOMPRESSION/DISCECTOMY FUSION 3 LEVELS C3-C6;  Surgeon: Venita Lick, MD;  Location: [facility];  Service: Orthopedics;  Laterality: Left;  4 hrs 3 C-Bed   ANTERIOR INTEROSSEOUS NERVE DECOMPRESSION Right 04/15/2021   Procedure: ANTERIOR INTEROSSEOUS NERVE DECOMPRESSION;  Surgeon: Dairl Ponder, MD;  Location: [facility];  Service: Orthopedics;  Laterality: Right;  Only need 1 hour for case,  Axillary block/Mac   CARDIAC CATHETERIZATION  10/27/2002   CARPAL TUNNEL RELEASE Right 11/21/2013   CARPAL TUNNEL RELEASE Right 04/15/2021    Procedure: CARPAL TUNNEL RELEASE;  Surgeon: Dairl Ponder, MD;  Location: [facility];  Service: Orthopedics;  Laterality: Right;   COLONOSCOPY WITH PROPOFOL N/A 04/29/2023   Procedure: COLONOSCOPY WITH PROPOFOL;  Surgeon: Jeani Hawking, MD;  Location: [facility];  Service: Gastroenterology;  Laterality: N/A;   CORONARY STENT PLACEMENT  10/27/2002   mid LAD   TOTAL HIP ARTHROPLASTY Left 04/09/1999   TOTAL HIP ARTHROPLASTY Right    TOTAL KNEE ARTHROPLASTY Left 02/03/2011   TOTAL KNEE ARTHROPLASTY Right 05/11/2021   Procedure: TOTAL KNEE ARTHROPLASTY;  Surgeon: Ollen Gross, MD;  Location: [facility];  Service: Orthopedics;  Laterality: Right;   ULNAR TUNNEL RELEASE Left 06/18/2016   Procedure: LEFT CUBITAL TUNNEL RELEASE;  Surgeon: Dairl Ponder, MD;  Location: [facility];  Service: Orthopedics;  Laterality: Left;   Patient Active Problem List   Diagnosis Date Noted   Aneurysm of ascending aorta (HCC) 06/06/2023   Cervical myelopathy (HCC) 11/09/2021   OA (osteoarthritis) of knee 05/11/2021   Primary osteoarthritis of right knee 05/11/2021   Colon cancer screening 10/31/2020   Gastroesophageal reflux disease 10/31/2020   Morbid obesity (HCC) 10/31/2020   History of colonic polyps 10/31/2020   Pain of left hip joint 12/13/2019   AVNRT (AV nodal re-entry tachycardia) (HCC)    Withdrawal arrhythmia (HCC)    Cubital tunnel syndrome, left  06/29/2016   CAD (coronary artery disease), native coronary artery 05/08/2014   Hyperlipidemia 03/21/2013    Class: Chronic   Old MI (myocardial infarction) 03/21/2013    Class: Chronic   DM (diabetes mellitus), type 2, uncontrolled 03/21/2013    Class: Chronic   Essential hypertension, benign 03/21/2013    Class: Chronic   Obesity (BMI 30-39.9) 03/21/2013   Chronic combined systolic and diastolic CHF (congestive heart failure) (HCC)      REFERRING PROVIDER: Doretha Sou, MD   REFERRING DIAG: [phone]  (ICD-10-CM) - Other spondylosis with myelopathy, cervical region   THERAPY DIAG:  Muscle weakness (generalized)  Other abnormalities of gait and mobility  Cervicalgia  Stiffness of right shoulder, not elsewhere classified  Stiffness of left shoulder, not elsewhere classified  Rationale for Evaluation and Treatment: Rehabilitation  ONSET DATE: DOS  11/19/2022  SUBJECTIVE:                                                                                                                                                                                                         SUBJECTIVE STATEMENT: Pt denies any adverse effects after prior Rx.  Pt reports improved ROM in UE's.  He states his strength is a little better.  Pt states he is performing his HEP a little bit.       Hand dominance: Right  PERTINENT HISTORY:  CHF, MI, coronary artery disease with stent placement, HTN, tachycardia, and Aneurysm of ascending aorta   DM type 2 OA  PSHx: Cervical fusion C3-6 10/2021 and C1-C3 posterior spinal fusion 11/19/2022 Bilat THA (2000 and 2002) Bilat TKA  (2012 and 2022) R wrist CTR R Forearm/Anterior interrosseous nerve decompression 03/2021 L Ulnar tunnel release L ankle surgery (child)   PAIN:  NPRS:  0/10 current and best, 5-6/10 worst  Location:  R UT, R sided cervical Type:  Numbness in bilat feet, R > L and bilat hands.  Burning in L hand. Aggravating factors:  Looking to the Right for a prolonged time including looking at the computer screen. Easing factors:  having correct posture    PRECAUTIONS: Other: cervical fusion x 2, bilat THA, bilat TKA, aneurysm of ascending aorta     WEIGHT BEARING RESTRICTIONS: none indicated  FALLS:  Has patient fallen in last 6 months? 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  PLOF: Independent  Pt has been using the cane since the 1st cervical fusion surgery.  Pt had better quality of gait/ambulation prior to the 2nd cervical fusion.  PATIENT GOALS: improve strength and coordination.    OBJECTIVE:  Note: Objective measures were completed at Evaluation unless otherwise noted.  DIAGNOSTIC FINDINGS:    Active ROM A/PROM (deg) eval 08/17/23  Flexion 13 62  Extension 32 25  Right lateral flexion     Left lateral flexion     Right rotation 22 26  Left rotation 20 30   (Blank rows = not tested) Pt had no pain with cervical AROM  TREATMENT DATE:        3/28   Shoulder Flexion  AROM:  Initial/Current: R:  102/137, L:  113/146 deg  Manual:  STM and to suboccipitals and paraspinals in supine. STM and TPR to bilat UT seated  Ther-ex: Supine shoulder FLEX with cane 2x10 Elbow extension with RTB 2x15 Banded rows x10, 15 reps with RTB Shoulder extension with retraction x10,15 reps with RTB  Ther-Act Reaching behind head 2x10 bilat Reaching up x10 bilat (seated) Shelf reach in standing 2 x 10 bilat Reaching behind back x10 bilat    3/26: Manual: All PROM performed with distraction to reduce pain and improve movement STM and trigger point release to suboccipitals, paraspinals, and upper traps There-ex: Supine shoulder FLEX with cane no weight 2x15 RPE 3 Shoulder shrugs 3x10 4 lbs RPE 5 There-Act Reaching behind head 2x10 Reaching up 2x10 Reaching behind back 2x10 Neuro-Re-ed  Banded rows 2x10 red RTB RPE 5 Banded pulldowns 3x10 red RTB RPE 5       3/20: Manual: STM and trigger point release to suboccipitals, paraspinals, and upper traps There-ex: Supine shoulder FLEX with cane no weight 2x15 RPE 3 Shoulder shrugs 2x10  There-Act  Self Care  Nuro-Re-ed                   Horizontal ABD with yellow RTB 2x12 RPE 4 Banded rows 2x10 yellow RTB                                                                                                         Manual:  Trigger point release to cervical spine/ upper trap . Review of self release using the liba thera-cane  There-ex:  Gross grip putty 2x15  Key grip 2x10  Finger tips 2x10   Nero-re-ed:   Bilateral er yellow 2x10  Horizontal abduction 2x10  Narrow base eyes open 2x20 sec  Eyes closed 2x10   Tandem stance eyes closed       Last visit:  Opposition x10 bilat Standing rows with retraction x15 reps each with RTB and GTB Standing shoulder extension with RTB 2x15 Standing triceps extension 2x10 with RTB  Standing biceps curl  3# 3x10 Pulleys in flexion 3x15  Pt received a HEP handout and was educated in correct form and appropriate frequency.  PATIENT EDUCATION:  Education details:  relevant anatomy, POC, rationale of interventions, exercise form and HEP.  Person educated: Patient  Education method: Explanation, demonstration, verbal and tactile cues, handout Education comprehension: verbalized understanding, returned demonstration, verbal and tactile cues required  HOME EXERCISE PROGRAM: Access Code: [email or web address] URL: [email or web address] Date: 08/04/2023 Prepared by: Aaron Edelman  Exercises - Thumb Opposition  - 2 x daily - 7 x weekly - 2-3 sets - 10 reps - Standing Shoulder Row with Anchored Resistance  - 1 x daily - 4-5 x weekly - 2 sets - 10-15 reps - Shoulder extension with resistance - Neutral  - 1 x daily - 4-5 x weekly - 2 sets - 10 reps  ASSESSMENT:  CLINICAL IMPRESSION: Pt demonstrates a good improvement in bilat shoulder flexion AROM as evidenced by goniometric measurements.  Pt performed reaching activities to improve functional reaching.  He requires cuing and instruction in correct form and performed exercises well with cuing and instruction.  PT performed STM to improve soft tissue tightness and mobility and reduce pain.  He has a trigger point in R UT.  Pt responded well to Rx having no c/o's and no  increased pain after Rx.  He should benefit from cont skilled PT to address impairments and ongoing goals and to improve overall function.     OBJECTIVE IMPAIRMENTS: Abnormal gait, decreased activity tolerance, decreased balance, decreased endurance, decreased mobility, difficulty walking, decreased ROM, decreased strength, hypomobility, impaired flexibility, impaired UE functional use, postural dysfunction, and pain.   ACTIVITY LIMITATIONS: squatting, transfers, reach over head, and locomotion level  PARTICIPATION LIMITATIONS: cleaning, shopping, and community activity  PERSONAL FACTORS: 3+ comorbidities: bilat THA, bilat TKA, CHF, DM, OA  are also affecting patient's functional outcome.   REHAB POTENTIAL: Good  CLINICAL DECISION MAKING: Stable/uncomplicated  EVALUATION COMPLEXITY: Low   GOALS:   SHORT TERM GOALS: Target date: 08/15/23   Pt will be independent and compliant with HEP for improved strength, pain, and mobility.  Baseline:  Goal status: 3/26 Pt met goal  2.  Pt will demo at least 10-15 deg in bilat shoulder flexion AROM for improved reaching.  Baseline:  Goal status: 3/26 Pt met goal  3.  Pt will report at least a 25% improvement in his gait and balance.   Baseline:  Goal status: 3/26 Pt met goal  4.  Pt will tolerate postural/scapular strengthening without adverse effects for improved stability and posture with daily and work activities and to reduce stress on cervical.  Baseline:  Goal status: 3/26 Pt met goal     LONG TERM GOALS: Target date: 09/06/2023  Pt will demo improved bilat elbow extension strength to 5/5 and bilat hip abd strength by at least 5-8 lbs for improved performance of transfers, mobility, and gait.  Baseline:  Goal status: INITIAL  2.  Pt will report at least a 70% improvement in his gait and balance.  Baseline:  Goal status: INITIAL  3.  Pt will demo improved posture with gait and improved step length bilat. Baseline:  Goal  status: INITIAL    PLAN:  PT FREQUENCY: 2x/week  PT DURATION: 6 weeks  PLANNED INTERVENTIONS: 97164- PT Re-evaluation, 97110-Therapeutic exercises, 97530- Therapeutic activity, [id number]- Neuromuscular re-education, 97535- Self Care, 21308- Manual therapy, [phone]- Gait training, [phone]- Aquatic Therapy, Patient/Family education, Balance training, Stair training, Taping, Dry Needling, Joint mobilization, Cryotherapy, and Moist heat  PLAN FOR NEXT SESSION: Postural/scapular strengthening with theraband, elbow strengthening, balance and gait.  Give HEP    Audie Clear III PT, DPT 08/19/23 12:29 PM

## 2023-08-22 ENCOUNTER — Encounter (HOSPITAL_BASED_OUTPATIENT_CLINIC_OR_DEPARTMENT_OTHER): Admitting: Physical Therapy

## 2023-08-23 ENCOUNTER — Ambulatory Visit (HOSPITAL_BASED_OUTPATIENT_CLINIC_OR_DEPARTMENT_OTHER): Attending: Internal Medicine | Admitting: Physical Therapy

## 2023-08-23 ENCOUNTER — Encounter (HOSPITAL_BASED_OUTPATIENT_CLINIC_OR_DEPARTMENT_OTHER): Payer: Self-pay | Admitting: Physical Therapy

## 2023-08-23 DIAGNOSIS — M542 Cervicalgia: Secondary | ICD-10-CM | POA: Diagnosis not present

## 2023-08-23 DIAGNOSIS — M6281 Muscle weakness (generalized): Secondary | ICD-10-CM | POA: Diagnosis not present

## 2023-08-23 DIAGNOSIS — M25611 Stiffness of right shoulder, not elsewhere classified: Secondary | ICD-10-CM | POA: Diagnosis not present

## 2023-08-23 DIAGNOSIS — R2689 Other abnormalities of gait and mobility: Secondary | ICD-10-CM | POA: Insufficient documentation

## 2023-08-23 DIAGNOSIS — M25612 Stiffness of left shoulder, not elsewhere classified: Secondary | ICD-10-CM | POA: Insufficient documentation

## 2023-08-23 NOTE — Therapy (Signed)
 OUTPATIENT PHYSICAL THERAPY CERVICAL TREATMENT   Patient Name: Ryan KRASS, MD MRN: [id number] DOB:04/24/1951, 73 y.o., male Today's Date: 08/24/2023  END OF SESSION:  PT End of Session - 08/23/23 0900     Visit Number 8    Number of Visits 12    Date for PT Re-Evaluation 09/06/23    Authorization Type MCR A and B    PT Start Time 0858   Pt arrived late to treatment   PT Stop Time 0936    PT Time Calculation (min) 38 min    Activity Tolerance Patient tolerated treatment well;No increased pain    Behavior During Therapy [facility] for tasks assessed/performed                 Past Medical History:  Diagnosis Date   Aneurysm of ascending aorta (HCC) 06/06/2023   Chronic combined systolic and diastolic CHF (congestive heart failure) (HCC)    Coronary atherosclerosis of native coronary artery    Dental crowns present    Essential hypertension, benign    states under control with meds., has been on med. x 15 yr.   GERD (gastroesophageal reflux disease)    Hepatitis    Drug induced hepatitis   MI (myocardial infarction) (HCC) 10/27/2002   Non-insulin dependent type 2 diabetes mellitus (HCC)    Obesity    Osteoarthritis    hips and knees   Pure hypercholesterolemia    Sleep apnea    Past Surgical History:  Procedure Laterality Date   ANKLE SURGERY Left    as a child   ANTERIOR CERVICAL DECOMP/DISCECTOMY FUSION Left 11/09/2021   Procedure: ANTERIOR CERVICAL DECOMPRESSION/DISCECTOMY FUSION 3 LEVELS C3-C6;  Surgeon: Venita Lick, MD;  Location: [facility];  Service: Orthopedics;  Laterality: Left;  4 hrs 3 C-Bed   ANTERIOR INTEROSSEOUS NERVE DECOMPRESSION Right 04/15/2021   Procedure: ANTERIOR INTEROSSEOUS NERVE DECOMPRESSION;  Surgeon: Dairl Ponder, MD;  Location: [facility];  Service: Orthopedics;  Laterality: Right;  Only need 1 hour for case,  Axillary block/Mac   CARDIAC CATHETERIZATION  10/27/2002   CARPAL TUNNEL RELEASE Right 11/21/2013   CARPAL  TUNNEL RELEASE Right 04/15/2021   Procedure: CARPAL TUNNEL RELEASE;  Surgeon: Dairl Ponder, MD;  Location: [facility];  Service: Orthopedics;  Laterality: Right;   COLONOSCOPY WITH PROPOFOL N/A 04/29/2023   Procedure: COLONOSCOPY WITH PROPOFOL;  Surgeon: Jeani Hawking, MD;  Location: [facility];  Service: Gastroenterology;  Laterality: N/A;   CORONARY STENT PLACEMENT  10/27/2002   mid LAD   TOTAL HIP ARTHROPLASTY Left 04/09/1999   TOTAL HIP ARTHROPLASTY Right    TOTAL KNEE ARTHROPLASTY Left 02/03/2011   TOTAL KNEE ARTHROPLASTY Right 05/11/2021   Procedure: TOTAL KNEE ARTHROPLASTY;  Surgeon: Ollen Gross, MD;  Location: [facility];  Service: Orthopedics;  Laterality: Right;   ULNAR TUNNEL RELEASE Left 06/18/2016   Procedure: LEFT CUBITAL TUNNEL RELEASE;  Surgeon: Dairl Ponder, MD;  Location: [facility];  Service: Orthopedics;  Laterality: Left;   Patient Active Problem List   Diagnosis Date Noted   Aneurysm of ascending aorta (HCC) 06/06/2023   Cervical myelopathy (HCC) 11/09/2021   OA (osteoarthritis) of knee 05/11/2021   Primary osteoarthritis of right knee 05/11/2021   Colon cancer screening 10/31/2020   Gastroesophageal reflux disease 10/31/2020   Morbid obesity (HCC) 10/31/2020   History of colonic polyps 10/31/2020   Pain of left hip joint 12/13/2019   AVNRT (AV nodal re-entry tachycardia) (HCC)    Withdrawal arrhythmia (HCC)  Cubital tunnel syndrome, left 06/29/2016   CAD (coronary artery disease), native coronary artery 05/08/2014   Hyperlipidemia 03/21/2013    Class: Chronic   Old MI (myocardial infarction) 03/21/2013    Class: Chronic   DM (diabetes mellitus), type 2, uncontrolled 03/21/2013    Class: Chronic   Essential hypertension, benign 03/21/2013    Class: Chronic   Obesity (BMI 30-39.9) 03/21/2013   Chronic combined systolic and diastolic CHF (congestive heart failure) (HCC)      REFERRING PROVIDER: Doretha Sou,  MD   REFERRING DIAG: [phone] (ICD-10-CM) - Other spondylosis with myelopathy, cervical region   THERAPY DIAG:  Muscle weakness (generalized)  Other abnormalities of gait and mobility  Cervicalgia  Stiffness of right shoulder, not elsewhere classified  Stiffness of left shoulder, not elsewhere classified  Rationale for Evaluation and Treatment: Rehabilitation  ONSET DATE: DOS  11/19/2022  SUBJECTIVE:                                                                                                                                                                                                         SUBJECTIVE STATEMENT: Pt states he felt good after prior Rx.  Pt reports improved ROM in UE's.  He states his strength is a little better.  Pt states he is performing his HEP a little bit, but not too much.  He denies pain currently.  Pt states he has some muscle spasms in R sided cervical paraspinals and tinnitus in R ear if he has his head turned to the right for too long.   Hand dominance: Right  PERTINENT HISTORY:  CHF, MI, coronary artery disease with stent placement, HTN, tachycardia, and Aneurysm of ascending aorta   DM type 2 OA  PSHx: Cervical fusion C3-6 10/2021 and C1-C3 posterior spinal fusion 11/19/2022 Bilat THA (2000 and 2002) Bilat TKA  (2012 and 2022) R wrist CTR R Forearm/Anterior interrosseous nerve decompression 03/2021 L Ulnar tunnel release L ankle surgery (child)   PAIN:  NPRS:  0/10 current and best, 5-6/10 worst  Location:  R UT, R sided cervical Type:  Numbness in bilat feet, R > L and bilat hands.  Burning in L hand. Aggravating factors:  Looking to the Right for a prolonged time including looking at the computer screen. Easing factors:  having correct posture    PRECAUTIONS: Other: cervical fusion x 2, bilat THA, bilat TKA, aneurysm of ascending aorta     WEIGHT BEARING RESTRICTIONS: none indicated  FALLS:  Has patient fallen in last 6 months?  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  He is not performing surgeries.    PLOF: Independent  Pt has been using the cane since the 1st cervical fusion surgery.  Pt had better quality of gait/ambulation prior to the 2nd cervical fusion.  PATIENT GOALS: improve strength and coordination.    OBJECTIVE:  Note: Objective measures were completed at Evaluation unless otherwise noted.  DIAGNOSTIC FINDINGS:     TREATMENT DATE:        4/1 Manual:  STM and to suboccipitals and paraspinals in supine. STM and TPR to bilat UT seated  Therapeutic Activity: Shelf reach in standing 2 x 10 bilat Sidestepping x 3 laps without UE support  Ther Ex: Rows with GTB 2x10 Standing shoulder extension with RTB 2x10 Seated Hz abd with YTB 2x10   3/28   Shoulder Flexion  AROM:  Initial/Current: R:  102/137, L:  113/146 deg  Manual:  STM and to suboccipitals and paraspinals in supine. STM and TPR to bilat UT seated  Ther-ex: Supine shoulder FLEX with cane 2x10 Elbow extension with RTB 2x15 Banded rows x10, 15 reps with RTB Shoulder extension with retraction x10,15 reps with RTB  Ther-Act Reaching behind head 2x10 bilat Reaching up x10 bilat (seated) Shelf reach in standing 2 x 10 bilat Reaching behind back x10 bilat    3/26: Manual: All PROM performed with distraction to reduce pain and improve movement STM and trigger point release to suboccipitals, paraspinals, and upper traps There-ex: Supine shoulder FLEX with cane no weight 2x15 RPE 3 Shoulder shrugs 3x10 4 lbs RPE 5 There-Act Reaching behind head 2x10 Reaching up 2x10 Reaching behind back 2x10 Neuro-Re-ed  Banded rows 2x10 red RTB RPE 5 Banded pulldowns 3x10 red RTB RPE 5       3/20: Manual: STM and trigger point release to suboccipitals, paraspinals, and upper  traps There-ex: Supine shoulder FLEX with cane no weight 2x15 RPE 3 Shoulder shrugs 2x10  There-Act  Self Care  Nuro-Re-ed                   Horizontal ABD with yellow RTB 2x12 RPE 4 Banded rows 2x10 yellow RTB                                                                                                        Manual:  Trigger point release to cervical spine/ upper trap . Review of self release using the liba thera-cane  There-ex:  Gross grip putty 2x15  Key grip 2x10  Finger tips 2x10   Nero-re-ed:   Bilateral er yellow 2x10  Horizontal abduction 2x10  Narrow base eyes open 2x20 sec  Eyes closed 2x10   Tandem stance eyes closed    PATIENT EDUCATION:  Education details:  relevant anatomy, POC, rationale of interventions, exercise form and HEP.  Person educated: Patient Education method: Explanation, demonstration, verbal and tactile cues, handout Education comprehension: verbalized  understanding, returned demonstration, verbal and tactile cues required  HOME EXERCISE PROGRAM: Access Code: [email or web address] URL: [email or web address] Date: 08/04/2023 Prepared by: Aaron Edelman  Exercises - Thumb Opposition  - 2 x daily - 7 x weekly - 2-3 sets - 10 reps - Standing Shoulder Row with Anchored Resistance  - 1 x daily - 4-5 x weekly - 2 sets - 10-15 reps - Shoulder extension with resistance - Neutral  - 1 x daily - 4-5 x weekly - 2 sets - 10 reps  ASSESSMENT:  CLINICAL IMPRESSION:  Pt is able to reach into shelf overhead.  Pt requires cuing and instruction in correct form with exercises.  PT performed STM to improve soft tissue tightness and mobility and reduce pain.  He has a trigger point in R UT and tolerated STM well.  Pt responded well to Rx stating he feels good, and has no pain after Rx.  He should benefit from cont skilled PT to address impairments and ongoing goals and to improve overall function.       OBJECTIVE IMPAIRMENTS: Abnormal gait, decreased  activity tolerance, decreased balance, decreased endurance, decreased mobility, difficulty walking, decreased ROM, decreased strength, hypomobility, impaired flexibility, impaired UE functional use, postural dysfunction, and pain.   ACTIVITY LIMITATIONS: squatting, transfers, reach over head, and locomotion level  PARTICIPATION LIMITATIONS: cleaning, shopping, and community activity  PERSONAL FACTORS: 3+ comorbidities: bilat THA, bilat TKA, CHF, DM, OA  are also affecting patient's functional outcome.   REHAB POTENTIAL: Good  CLINICAL DECISION MAKING: Stable/uncomplicated  EVALUATION COMPLEXITY: Low   GOALS:   SHORT TERM GOALS: Target date: 08/15/23   Pt will be independent and compliant with HEP for improved strength, pain, and mobility.  Baseline:  Goal status: 3/26 Pt met goal  2.  Pt will demo at least 10-15 deg in bilat shoulder flexion AROM for improved reaching.  Baseline:  Goal status: 3/26 Pt met goal  3.  Pt will report at least a 25% improvement in his gait and balance.   Baseline:  Goal status: 3/26 Pt met goal  4.  Pt will tolerate postural/scapular strengthening without adverse effects for improved stability and posture with daily and work activities and to reduce stress on cervical.  Baseline:  Goal status: 3/26 Pt met goal     LONG TERM GOALS: Target date: 09/06/2023  Pt will demo improved bilat elbow extension strength to 5/5 and bilat hip abd strength by at least 5-8 lbs for improved performance of transfers, mobility, and gait.  Baseline:  Goal status: INITIAL  2.  Pt will report at least a 70% improvement in his gait and balance.  Baseline:  Goal status: INITIAL  3.  Pt will demo improved posture with gait and improved step length bilat. Baseline:  Goal status: INITIAL    PLAN:  PT FREQUENCY: 2x/week  PT DURATION: 6 weeks  PLANNED INTERVENTIONS: 97164- PT Re-evaluation, 97110-Therapeutic exercises, 97530- Therapeutic activity, [id number]  Neuromuscular re-education, 97535- Self Care, 16109- Manual therapy, [phone]- Gait training, [phone]- Aquatic Therapy, Patient/Family education, Balance training, Stair training, Taping, Dry Needling, Joint mobilization, Cryotherapy, and Moist heat  PLAN FOR NEXT SESSION: Postural/scapular strengthening with theraband, elbow strengthening, balance and gait.      Audie Clear III PT, DPT 08/24/23 11:58 AM

## 2023-08-24 ENCOUNTER — Encounter (HOSPITAL_BASED_OUTPATIENT_CLINIC_OR_DEPARTMENT_OTHER): Payer: Self-pay | Admitting: Physical Therapy

## 2023-08-25 ENCOUNTER — Other Ambulatory Visit (HOSPITAL_COMMUNITY): Payer: Self-pay

## 2023-08-25 ENCOUNTER — Ambulatory Visit (HOSPITAL_BASED_OUTPATIENT_CLINIC_OR_DEPARTMENT_OTHER): Admitting: Physical Therapy

## 2023-08-25 ENCOUNTER — Encounter (HOSPITAL_BASED_OUTPATIENT_CLINIC_OR_DEPARTMENT_OTHER): Payer: Self-pay | Admitting: Physical Therapy

## 2023-08-25 ENCOUNTER — Other Ambulatory Visit: Payer: Self-pay

## 2023-08-25 DIAGNOSIS — M25611 Stiffness of right shoulder, not elsewhere classified: Secondary | ICD-10-CM | POA: Diagnosis not present

## 2023-08-25 DIAGNOSIS — M542 Cervicalgia: Secondary | ICD-10-CM

## 2023-08-25 DIAGNOSIS — M6281 Muscle weakness (generalized): Secondary | ICD-10-CM

## 2023-08-25 DIAGNOSIS — R2689 Other abnormalities of gait and mobility: Secondary | ICD-10-CM | POA: Diagnosis not present

## 2023-08-25 DIAGNOSIS — M25612 Stiffness of left shoulder, not elsewhere classified: Secondary | ICD-10-CM

## 2023-08-25 NOTE — Therapy (Signed)
 OUTPATIENT PHYSICAL THERAPY CERVICAL TREATMENT   Patient Name: Ryan BAUDOIN, MD MRN: [id number] DOB:1951/05/20, 73 y.o., male Today's Date: 08/25/2023  END OF SESSION:  PT End of Session - 08/25/23 0857     Visit Number 9    Number of Visits 12    Date for PT Re-Evaluation 09/06/23    Authorization Type MCR A and B    PT Start Time 0854    PT Stop Time 0937    PT Time Calculation (min) 43 min    Activity Tolerance Patient tolerated treatment well;No increased pain    Behavior During Therapy [facility] for tasks assessed/performed                 Past Medical History:  Diagnosis Date   Aneurysm of ascending aorta (HCC) 06/06/2023   Chronic combined systolic and diastolic CHF (congestive heart failure) (HCC)    Coronary atherosclerosis of native coronary artery    Dental crowns present    Essential hypertension, benign    states under control with meds., has been on med. x 15 yr.   GERD (gastroesophageal reflux disease)    Hepatitis    Drug induced hepatitis   MI (myocardial infarction) (HCC) 10/27/2002   Non-insulin dependent type 2 diabetes mellitus (HCC)    Obesity    Osteoarthritis    hips and knees   Pure hypercholesterolemia    Sleep apnea    Past Surgical History:  Procedure Laterality Date   ANKLE SURGERY Left    as a child   ANTERIOR CERVICAL DECOMP/DISCECTOMY FUSION Left 11/09/2021   Procedure: ANTERIOR CERVICAL DECOMPRESSION/DISCECTOMY FUSION 3 LEVELS C3-C6;  Surgeon: Venita Lick, MD;  Location: [facility];  Service: Orthopedics;  Laterality: Left;  4 hrs 3 C-Bed   ANTERIOR INTEROSSEOUS NERVE DECOMPRESSION Right 04/15/2021   Procedure: ANTERIOR INTEROSSEOUS NERVE DECOMPRESSION;  Surgeon: Dairl Ponder, MD;  Location: [facility];  Service: Orthopedics;  Laterality: Right;  Only need 1 hour for case,  Axillary block/Mac   CARDIAC CATHETERIZATION  10/27/2002   CARPAL TUNNEL RELEASE Right 11/21/2013   CARPAL TUNNEL RELEASE Right 04/15/2021    Procedure: CARPAL TUNNEL RELEASE;  Surgeon: Dairl Ponder, MD;  Location: [facility];  Service: Orthopedics;  Laterality: Right;   COLONOSCOPY WITH PROPOFOL N/A 04/29/2023   Procedure: COLONOSCOPY WITH PROPOFOL;  Surgeon: Jeani Hawking, MD;  Location: [facility];  Service: Gastroenterology;  Laterality: N/A;   CORONARY STENT PLACEMENT  10/27/2002   mid LAD   TOTAL HIP ARTHROPLASTY Left 04/09/1999   TOTAL HIP ARTHROPLASTY Right    TOTAL KNEE ARTHROPLASTY Left 02/03/2011   TOTAL KNEE ARTHROPLASTY Right 05/11/2021   Procedure: TOTAL KNEE ARTHROPLASTY;  Surgeon: Ollen Gross, MD;  Location: [facility];  Service: Orthopedics;  Laterality: Right;   ULNAR TUNNEL RELEASE Left 06/18/2016   Procedure: LEFT CUBITAL TUNNEL RELEASE;  Surgeon: Dairl Ponder, MD;  Location: [facility];  Service: Orthopedics;  Laterality: Left;   Patient Active Problem List   Diagnosis Date Noted   Aneurysm of ascending aorta (HCC) 06/06/2023   Cervical myelopathy (HCC) 11/09/2021   OA (osteoarthritis) of knee 05/11/2021   Primary osteoarthritis of right knee 05/11/2021   Colon cancer screening 10/31/2020   Gastroesophageal reflux disease 10/31/2020   Morbid obesity (HCC) 10/31/2020   History of colonic polyps 10/31/2020   Pain of left hip joint 12/13/2019   AVNRT (AV nodal re-entry tachycardia) (HCC)    Withdrawal arrhythmia (HCC)    Cubital tunnel syndrome, left  06/29/2016   CAD (coronary artery disease), native coronary artery 05/08/2014   Hyperlipidemia 03/21/2013    Class: Chronic   Old MI (myocardial infarction) 03/21/2013    Class: Chronic   DM (diabetes mellitus), type 2, uncontrolled 03/21/2013    Class: Chronic   Essential hypertension, benign 03/21/2013    Class: Chronic   Obesity (BMI 30-39.9) 03/21/2013   Chronic combined systolic and diastolic CHF (congestive heart failure) (HCC)      REFERRING PROVIDER: Doretha Sou, MD   REFERRING DIAG: [id number]  (ICD-10-CM) - Other spondylosis with myelopathy, cervical region   THERAPY DIAG:  Muscle weakness (generalized)  Other abnormalities of gait and mobility  Cervicalgia  Stiffness of right shoulder, not elsewhere classified  Stiffness of left shoulder, not elsewhere classified  Rationale for Evaluation and Treatment: Rehabilitation  ONSET DATE: DOS  11/19/2022  SUBJECTIVE:                                                                                                                                                                                                         SUBJECTIVE STATEMENT: Pt denies any adverse effects after prior Rx.  Pt states he felt good after prior Rx.  Pt reports his ROM is slowly getting better.  He denies pain currently.     Hand dominance: Right  PERTINENT HISTORY:  CHF, MI, coronary artery disease with stent placement, HTN, tachycardia, and Aneurysm of ascending aorta   DM type 2 OA  PSHx: Cervical fusion C3-6 10/2021 and C1-C3 posterior spinal fusion 11/19/2022 Bilat THA (2000 and 2002) Bilat TKA  (2012 and 2022) R wrist CTR R Forearm/Anterior interrosseous nerve decompression 03/2021 L Ulnar tunnel release L ankle surgery (child)   PAIN:  NPRS:  0/10 current and best, 5-6/10 worst  Location:  R UT, R sided cervical Type:  Numbness in bilat feet, R > L and bilat hands.  Burning in L hand. Aggravating factors:  Looking to the Right for a prolonged time including looking at the computer screen. Easing factors:  having correct posture    PRECAUTIONS: Other: cervical fusion x 2, bilat THA, bilat TKA, aneurysm of ascending aorta     WEIGHT BEARING RESTRICTIONS: none indicated  FALLS:  Has patient fallen in last 6 months? 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  He is not performing  surgeries.    PLOF: Independent  Pt has been using the cane since the 1st cervical fusion surgery.  Pt had better quality of gait/ambulation prior to the 2nd cervical fusion.  PATIENT GOALS: improve strength and coordination.    OBJECTIVE:  Note: Objective measures were completed at Evaluation unless otherwise noted.  DIAGNOSTIC FINDINGS:     TREATMENT DATE:        4/3  Therapeutic Activity: Shelf reach in standing 2 x 10 bilat Sidestepping x 3 laps without UE support Marching on Airex x 15 with UE support  Ther Ex: Pulleys in flexion and scaption Rows with GTB 2x10 Standing shoulder extension with RTB 2x15 Standing elbow extension with RTB 2x15 Seated Hz abd with YTB 2x10 Standing on airex with FA 2x30 sec and NBOS 2x30 sec  Reviewed and updated HEP.  Pt requested a handout of all exercises.  PT printed out a HEP and gave pt a handout. PT educated pt in appropriate frequency of home exercises.   4/1 Manual:  STM and to suboccipitals and paraspinals in supine. STM and TPR to bilat UT seated  Therapeutic Activity: Shelf reach in standing 2 x 10 bilat Sidestepping x 3 laps without UE support  Ther Ex: Pulleys in flexion and scaption Rows with GTB 2x10 Standing shoulder extension with RTB 2x10 Seated Hz abd with YTB 2x10   3/28   Shoulder Flexion  AROM:  Initial/Current: R:  102/137, L:  113/146 deg  Manual:  STM and to suboccipitals and paraspinals in supine. STM and TPR to bilat UT seated  Ther-ex: Supine shoulder FLEX with cane 2x10 Elbow extension with RTB 2x15 Banded rows x10, 15 reps with RTB Shoulder extension with retraction x10,15 reps with RTB  Ther-Act Reaching behind head 2x10 bilat Reaching up x10 bilat (seated) Shelf reach in standing 2 x 10 bilat Reaching behind back x10 bilat    3/26: Manual: All PROM performed with distraction to reduce pain and improve movement STM and trigger point release to suboccipitals, paraspinals, and  upper traps There-ex: Supine shoulder FLEX with cane no weight 2x15 RPE 3 Shoulder shrugs 3x10 4 lbs RPE 5 There-Act Reaching behind head 2x10 Reaching up 2x10 Reaching behind back 2x10 Neuro-Re-ed  Banded rows 2x10 red RTB RPE 5 Banded pulldowns 3x10 red RTB RPE 5       3/20: Manual: STM and trigger point release to suboccipitals, paraspinals, and upper traps There-ex: Supine shoulder FLEX with cane no weight 2x15 RPE 3 Shoulder shrugs 2x10  There-Act  Self Care  Nuro-Re-ed                   Horizontal ABD with yellow RTB 2x12 RPE 4 Banded rows 2x10 yellow RTB                                                                                                        Manual:  Trigger point release to cervical spine/ upper trap . Review of self release using the liba thera-cane  There-ex:  Gross grip putty 2x15  Key grip 2x10  Finger tips 2x10  Nero-re-ed:   Bilateral er yellow 2x10  Horizontal abduction 2x10  Narrow base eyes open 2x20 sec  Eyes closed 2x10   Tandem stance eyes closed    PATIENT EDUCATION:  Education details:  PT answered questions.  Updated HEP.  relevant anatomy, POC, rationale of interventions, exercise form and HEP.  Person educated: Patient Education method: Explanation, demonstration, verbal and tactile cues, handout Education comprehension: verbalized understanding, returned demonstration, verbal and tactile cues required  HOME EXERCISE PROGRAM: Access Code: [email or web address] URL: [email or web address] Date: 08/04/2023 Prepared by: Aaron Edelman  Exercises - Thumb Opposition  - 2 x daily - 7 x weekly - 2-3 sets - 10 reps - Standing Shoulder Row with Anchored Resistance  - 1 x daily - 4-5 x weekly - 2 sets - 10-15 reps - Shoulder extension with resistance - Neutral  - 1 x daily - 4-5 x weekly - 2 sets - 10 reps  ASSESSMENT:  CLINICAL IMPRESSION:  Pt has improved UE elevation and is able to reach into shelf overhead.  Pt is  improving on form with exercises and requires cuing and instruction in correct form.  Pt has difficulty with correct form with seated hz abduction.  PT worked on shoulder mobility, postural/scapular stabilization and strengthening, and balance/stability.  Pt tolerated exercises well.  He requested a handout with all of his home exercises.  PT updated HEP and gave pt a handout.  PT educated pt in correct form and appropriate frequency of exercies.  He responded well to Rx having no c/o's and no pain after Rx.  He should benefit from cont skilled PT to address impairments and ongoing goals and to improve overall function.      OBJECTIVE IMPAIRMENTS: Abnormal gait, decreased activity tolerance, decreased balance, decreased endurance, decreased mobility, difficulty walking, decreased ROM, decreased strength, hypomobility, impaired flexibility, impaired UE functional use, postural dysfunction, and pain.   ACTIVITY LIMITATIONS: squatting, transfers, reach over head, and locomotion level  PARTICIPATION LIMITATIONS: cleaning, shopping, and community activity  PERSONAL FACTORS: 3+ comorbidities: bilat THA, bilat TKA, CHF, DM, OA  are also affecting patient's functional outcome.   REHAB POTENTIAL: Good  CLINICAL DECISION MAKING: Stable/uncomplicated  EVALUATION COMPLEXITY: Low   GOALS:   SHORT TERM GOALS: Target date: 08/15/23   Pt will be independent and compliant with HEP for improved strength, pain, and mobility.  Baseline:  Goal status: 3/26 Pt met goal  2.  Pt will demo at least 10-15 deg in bilat shoulder flexion AROM for improved reaching.  Baseline:  Goal status: 3/26 Pt met goal  3.  Pt will report at least a 25% improvement in his gait and balance.   Baseline:  Goal status: 3/26 Pt met goal  4.  Pt will tolerate postural/scapular strengthening without adverse effects for improved stability and posture with daily and work activities and to reduce stress on cervical.  Baseline:   Goal status: 3/26 Pt met goal     LONG TERM GOALS: Target date: 09/06/2023  Pt will demo improved bilat elbow extension strength to 5/5 and bilat hip abd strength by at least 5-8 lbs for improved performance of transfers, mobility, and gait.  Baseline:  Goal status: INITIAL  2.  Pt will report at least a 70% improvement in his gait and balance.  Baseline:  Goal status: INITIAL  3.  Pt will demo improved posture with gait and improved step length bilat. Baseline:  Goal status: INITIAL    PLAN:  PT FREQUENCY: 2x/week  PT DURATION: 6 weeks  PLANNED INTERVENTIONS: 97164- PT Re-evaluation, 97110-Therapeutic exercises, 97530- Therapeutic activity, [id number]- Neuromuscular re-education, 97535- Self Care, 16109- Manual therapy, [phone]- Gait training, [phone]- Aquatic Therapy, Patient/Family education, Balance training, Stair training, Taping, Dry Needling, Joint mobilization, Cryotherapy, and Moist heat  PLAN FOR NEXT SESSION: Postural/scapular strengthening with theraband, elbow strengthening, balance and gait.      Audie Clear III PT, DPT 08/25/23 11:26 PM

## 2023-08-29 ENCOUNTER — Ambulatory Visit (HOSPITAL_BASED_OUTPATIENT_CLINIC_OR_DEPARTMENT_OTHER): Admitting: Physical Therapy

## 2023-08-29 ENCOUNTER — Encounter (HOSPITAL_BASED_OUTPATIENT_CLINIC_OR_DEPARTMENT_OTHER): Payer: Self-pay | Admitting: Physical Therapy

## 2023-08-29 DIAGNOSIS — M25612 Stiffness of left shoulder, not elsewhere classified: Secondary | ICD-10-CM | POA: Diagnosis not present

## 2023-08-29 DIAGNOSIS — R2689 Other abnormalities of gait and mobility: Secondary | ICD-10-CM | POA: Diagnosis not present

## 2023-08-29 DIAGNOSIS — M6281 Muscle weakness (generalized): Secondary | ICD-10-CM

## 2023-08-29 DIAGNOSIS — M542 Cervicalgia: Secondary | ICD-10-CM | POA: Diagnosis not present

## 2023-08-29 DIAGNOSIS — M25611 Stiffness of right shoulder, not elsewhere classified: Secondary | ICD-10-CM | POA: Diagnosis not present

## 2023-08-29 NOTE — Therapy (Incomplete)
 OUTPATIENT PHYSICAL THERAPY CERVICAL TREATMENT Progress Note Reporting Period 07/25/2023 to 08/29/2023  See note below for Objective Data and Assessment of Progress/Goals.       Patient Name: Ryan HOHEISEL, MD MRN: [id number] DOB:[birth year], 73 y.o., male Today's Date: 08/30/2023  END OF SESSION:  PT End of Session - 08/29/23 0912     Visit Number 10    Number of Visits 20    Date for PT Re-Evaluation 10/03/23    Authorization Type MCR A and B    PT Start Time 0900   Pt arrived late to treatment   PT Stop Time 0931    PT Time Calculation (min) 31 min    Activity Tolerance Patient tolerated treatment well;No increased pain    Behavior During Therapy [facility] for tasks assessed/performed                 Past Medical History:  Diagnosis Date   Aneurysm of ascending aorta (HCC) 06/06/2023   Chronic combined systolic and diastolic CHF (congestive heart failure) (HCC)    Coronary atherosclerosis of native coronary artery    Dental crowns present    Essential hypertension, benign    states under control with meds., has been on med. x 15 yr.   GERD (gastroesophageal reflux disease)    Hepatitis    Drug induced hepatitis   MI (myocardial infarction) (HCC) 10/27/2002   Non-insulin dependent type 2 diabetes mellitus (HCC)    Obesity    Osteoarthritis    hips and knees   Pure hypercholesterolemia    Sleep apnea    Past Surgical History:  Procedure Laterality Date   ANKLE SURGERY Left    as a child   ANTERIOR CERVICAL DECOMP/DISCECTOMY FUSION Left 11/09/2021   Procedure: ANTERIOR CERVICAL DECOMPRESSION/DISCECTOMY FUSION 3 LEVELS C3-C6;  Surgeon: Venita Lick, MD;  Location: [facility];  Service: Orthopedics;  Laterality: Left;  4 hrs 3 C-Bed   ANTERIOR INTEROSSEOUS NERVE DECOMPRESSION Right 04/15/2021   Procedure: ANTERIOR INTEROSSEOUS NERVE DECOMPRESSION;  Surgeon: Dairl Ponder, MD;  Location: [facility];  Service: Orthopedics;  Laterality: Right;  Only  need 1 hour for case,  Axillary block/Mac   CARDIAC CATHETERIZATION  10/27/2002   CARPAL TUNNEL RELEASE Right 11/21/2013   CARPAL TUNNEL RELEASE Right 04/15/2021   Procedure: CARPAL TUNNEL RELEASE;  Surgeon: Dairl Ponder, MD;  Location: [facility];  Service: Orthopedics;  Laterality: Right;   COLONOSCOPY WITH PROPOFOL N/A 04/29/2023   Procedure: COLONOSCOPY WITH PROPOFOL;  Surgeon: Jeani Hawking, MD;  Location: [facility];  Service: Gastroenterology;  Laterality: N/A;   CORONARY STENT PLACEMENT  10/27/2002   mid LAD   TOTAL HIP ARTHROPLASTY Left 04/09/1999   TOTAL HIP ARTHROPLASTY Right    TOTAL KNEE ARTHROPLASTY Left 02/03/2011   TOTAL KNEE ARTHROPLASTY Right 05/11/2021   Procedure: TOTAL KNEE ARTHROPLASTY;  Surgeon: Ollen Gross, MD;  Location: [facility];  Service: Orthopedics;  Laterality: Right;   ULNAR TUNNEL RELEASE Left 06/18/2016   Procedure: LEFT CUBITAL TUNNEL RELEASE;  Surgeon: Dairl Ponder, MD;  Location: [facility];  Service: Orthopedics;  Laterality: Left;   Patient Active Problem List   Diagnosis Date Noted   Aneurysm of ascending aorta (HCC) 06/06/2023   Cervical myelopathy (HCC) 11/09/2021   OA (osteoarthritis) of knee 05/11/2021   Primary osteoarthritis of right knee 05/11/2021   Colon cancer screening 10/31/2020   Gastroesophageal reflux disease 10/31/2020   Morbid obesity (HCC) 10/31/2020   History of colonic polyps 10/31/2020  Pain of left hip joint 12/13/2019   AVNRT (AV nodal re-entry tachycardia) (HCC)    Withdrawal arrhythmia (HCC)    Cubital tunnel syndrome, left 06/29/2016   CAD (coronary artery disease), native coronary artery 05/08/2014   Hyperlipidemia 03/21/2013    Class: Chronic   Old MI (myocardial infarction) 03/21/2013    Class: Chronic   DM (diabetes mellitus), type 2, uncontrolled 03/21/2013    Class: Chronic   Essential hypertension, benign 03/21/2013    Class: Chronic   Obesity (BMI 30-39.9)  03/21/2013   Chronic combined systolic and diastolic CHF (congestive heart failure) (HCC)      REFERRING PROVIDER: Doretha Sou, MD   REFERRING DIAG: [phone] (ICD-10-CM) - Other spondylosis with myelopathy, cervical region   THERAPY DIAG:  Muscle weakness (generalized)  Other abnormalities of gait and mobility  Cervicalgia  Stiffness of right shoulder, not elsewhere classified  Stiffness of left shoulder, not elsewhere classified  Rationale for Evaluation and Treatment: Rehabilitation  ONSET DATE: DOS  11/19/2022  SUBJECTIVE:                                                                                                                                                                                                         SUBJECTIVE STATEMENT: Pt denies any adverse effects after prior Rx.  Pt states he felt good after prior Rx.  Pt reports improved ROM and strength.  He is able to reach higher at home.  He denies pain currently.  Pt will reports 50% improvement in his gait and balance.  Pt reports limitations with reaching behind his head.  He still has deficits with strength, walking, balance, and hand dexterity.       Hand dominance: Right  PERTINENT HISTORY:  CHF, MI, coronary artery disease with stent placement, HTN, tachycardia, and Aneurysm of ascending aorta   DM type 2 OA  PSHx: Cervical fusion C3-6 10/2021 and C1-C3 posterior spinal fusion 11/19/2022 Bilat THA (2000 and 2002) Bilat TKA  (2012 and 2022) R wrist CTR R Forearm/Anterior interrosseous nerve decompression 03/2021 L Ulnar tunnel release L ankle surgery (child)   PAIN:  NPRS:  0/10 current and best, 5-6/10 worst  Location:  R UT, R sided cervical Type:  Numbness in bilat feet, R > L and bilat hands.  Burning in L hand. Aggravating factors:  Looking to the Right for a prolonged time including looking at the computer screen. Easing factors:  having correct posture    PRECAUTIONS: Other:  cervical fusion x 2, bilat THA, bilat TKA, aneurysm of ascending aorta  WEIGHT BEARING RESTRICTIONS: none indicated  FALLS:  Has patient fallen in last 6 months? 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    PLOF: Independent  Pt has been using the cane since the 1st cervical fusion surgery.  Pt had better quality of gait/ambulation prior to the 2nd cervical fusion.  PATIENT GOALS: improve strength and coordination.    OBJECTIVE:  Note: Objective measures were completed at Evaluation unless otherwise noted.  DIAGNOSTIC FINDINGS:     TREATMENT DATE:        4/7 Shoulder Flexion AROM: Initial/Current: R: 102/141, L: 113/147 deg    MMT and HHD Right eval Left eval Right 4/7 Left 4/7  Shoulder flexion                 Hip abduction 21.1 25 29.3 28.9  Shoulder adduction        Shoulder extension        Shoulder internal rotation        Shoulder external rotation        Elbow flexion 5/5 5/5    Elbow extension 4/5 seated 4/5 seated 4+/5 seated 4-/5 seated  Wrist flexion 4+ to 5/5  4/5    Wrist extension Weakness in bilat though worse on R      Knee flexion 5/5 seated 5/5 seated    Knee extension 5/5 5/5    DF 5/5 5/5    PF WFL seated WFL seated     (Blank rows = not tested)   Active ROM A/PROM (deg) eval 08/17/23 4/7  Flexion 13 62   Extension 32 25   Right lateral flexion       Left lateral flexion       Right rotation 22 26 31   Left rotation 20 30 26    (Blank rows = not tested) Pt had no pain with cervical AROM   Shoulder pulleys in flexion and scaption Shelf reaches 3x10 bilat Seated hz abd with YTB Sidestepping x 3 laps   UEFI:  62/80    4/3  Therapeutic Activity: Shelf reach in standing 2 x 10 bilat Sidestepping x 3 laps without UE support Marching on Airex x  15 with UE support  Ther Ex: Pulleys in flexion and scaption Rows with GTB 2x10 Standing shoulder extension with RTB 2x15 Standing elbow extension with RTB 2x15 Seated Hz abd with YTB 2x10 Standing on airex with FA 2x30 sec and NBOS 2x30 sec  Reviewed and updated HEP.  Pt requested a handout of all exercises.  PT printed out a HEP and gave pt a handout. PT educated pt in appropriate frequency of home exercises.   4/1 Manual:  STM and to suboccipitals and paraspinals in supine. STM and TPR to bilat UT seated  Therapeutic Activity: Shelf reach in standing 2 x 10 bilat Sidestepping x 3 laps without UE support  Ther Ex: Pulleys in flexion and scaption Rows with GTB 2x10 Standing shoulder extension with RTB 2x10 Seated Hz abd with YTB 2x10   3/28   Shoulder Flexion  AROM:  Initial/Current: R:  102/137, L:  113/146 deg  Manual:  STM and to suboccipitals and paraspinals in supine. STM and TPR to bilat UT seated  Ther-ex: Supine shoulder FLEX with cane 2x10 Elbow extension with RTB 2x15 Banded  rows x10, 15 reps with RTB Shoulder extension with retraction x10,15 reps with RTB  Ther-Act Reaching behind head 2x10 bilat Reaching up x10 bilat (seated) Shelf reach in standing 2 x 10 bilat Reaching behind back x10 bilat    3/26: Manual: All PROM performed with distraction to reduce pain and improve movement STM and trigger point release to suboccipitals, paraspinals, and upper traps There-ex: Supine shoulder FLEX with cane no weight 2x15 RPE 3 Shoulder shrugs 3x10 4 lbs RPE 5 There-Act Reaching behind head 2x10 Reaching up 2x10 Reaching behind back 2x10 Neuro-Re-ed  Banded rows 2x10 red RTB RPE 5 Banded pulldowns 3x10 red RTB RPE 5         PATIENT EDUCATION:  Education details:  PT answered questions.  relevant anatomy, POC, rationale of interventions, exercise form and HEP.  Person educated: Patient Education method: Explanation, demonstration, verbal  and tactile cues, handout Education comprehension: verbalized understanding, returned demonstration, verbal and tactile cues required  HOME EXERCISE PROGRAM: Access Code: [email or web address] URL: [email or web address] Date: 08/04/2023 Prepared by: Aaron Edelman  Exercises - Thumb Opposition  - 2 x daily - 7 x weekly - 2-3 sets - 10 reps - Standing Shoulder Row with Anchored Resistance  - 1 x daily - 4-5 x weekly - 2 sets - 10-15 reps - Shoulder extension with resistance - Neutral  - 1 x daily - 4-5 x weekly - 2 sets - 10 reps  ASSESSMENT:  CLINICAL IMPRESSION: Treatment time limited today due to pt arriving late.  Pt is improving with function as evidenced by subjective reports.   Pt reports 50% improvement in his gait and balance.  He is able to reach higher at home though still reports limitations with reaching behind his head.  Though pt is improving, he still reports deficits with strength, walking, balance, and hand dexterity.  Pt demonstrates improved bilat hip abduction strength as evidenced by HHD testing.  He has improved with R elbow extension strength though has worse L elbow extension strength.  Pt demonstrates improved cervical rotation AROM and bilat shoulder flexion AROM as evidenced by goniometric testing.  He responded well to Rx having no c/o's after Rx.  He has met all his STG's and partially met LTG #2.  He should benefit from cont skilled PT to address impairments and ongoing goals and to restore desired level of function.     OBJECTIVE IMPAIRMENTS: Abnormal gait, decreased activity tolerance, decreased balance, decreased endurance, decreased mobility, difficulty walking, decreased ROM, decreased strength, hypomobility, impaired flexibility, impaired UE functional use, postural dysfunction, and pain.   ACTIVITY LIMITATIONS: squatting, transfers, reach over head, and locomotion level  PARTICIPATION LIMITATIONS: cleaning, shopping, and community activity  PERSONAL  FACTORS: 3+ comorbidities: bilat THA, bilat TKA, CHF, DM, OA  are also affecting patient's functional outcome.   REHAB POTENTIAL: Good  CLINICAL DECISION MAKING: Stable/uncomplicated  EVALUATION COMPLEXITY: Low   GOALS:   SHORT TERM GOALS: Target date: 08/15/23   Pt will be independent and compliant with HEP for improved strength, pain, and mobility.  Baseline:  Goal status: 3/26 Pt met goal  2.  Pt will demo at least 10-15 deg in bilat shoulder flexion AROM for improved reaching.  Baseline:  Goal status: 3/26 Pt met goal  3.  Pt will report at least a 25% improvement in his gait and balance.   Baseline:  Goal status: 3/26 Pt met goal  4.  Pt will tolerate postural/scapular strengthening without adverse effects for improved stability and posture with daily  and work activities and to reduce stress on cervical.  Baseline:  Goal status: 3/26 Pt met goal     LONG TERM GOALS: Target date: 10/03/2023  Pt will demo improved bilat elbow extension strength to 5/5 and bilat hip abd strength by at least 5-8 lbs for improved performance of transfers, mobility, and gait.  Baseline:  Goal status: PROGRESSING  4/7  2.  Pt will report at least a 70% improvement in his gait and balance.  Baseline:  Goal status: 70% MET  4/7  3.  Pt will demo improved posture with gait and improved step length bilat. Baseline:  Goal status: ONGOING    PLAN:  PT FREQUENCY: 2x/week  PT DURATION:  5 weeks  PLANNED INTERVENTIONS: 97164- PT Re-evaluation, 97110-Therapeutic exercises, 97530- Therapeutic activity, [id number]- Neuromuscular re-education, 97535- Self Care, [id number]- Manual therapy, [phone]- Gait training, [phone]- Aquatic Therapy, Patient/Family education, Balance training, Stair training, Taping, Dry Needling, Joint mobilization, Cryotherapy, and Moist heat  PLAN FOR NEXT SESSION: Postural/scapular strengthening with theraband, elbow strengthening, balance and gait.      Audie Clear III PT,  DPT 08/30/23 6:36 AM

## 2023-08-31 ENCOUNTER — Other Ambulatory Visit (HOSPITAL_COMMUNITY): Payer: Self-pay

## 2023-09-01 ENCOUNTER — Encounter (HOSPITAL_BASED_OUTPATIENT_CLINIC_OR_DEPARTMENT_OTHER): Payer: Self-pay | Admitting: Physical Therapy

## 2023-09-01 ENCOUNTER — Other Ambulatory Visit (HOSPITAL_COMMUNITY): Payer: Self-pay

## 2023-09-01 ENCOUNTER — Ambulatory Visit (HOSPITAL_BASED_OUTPATIENT_CLINIC_OR_DEPARTMENT_OTHER): Admitting: Physical Therapy

## 2023-09-01 DIAGNOSIS — M25611 Stiffness of right shoulder, not elsewhere classified: Secondary | ICD-10-CM

## 2023-09-01 DIAGNOSIS — R2689 Other abnormalities of gait and mobility: Secondary | ICD-10-CM

## 2023-09-01 DIAGNOSIS — M25612 Stiffness of left shoulder, not elsewhere classified: Secondary | ICD-10-CM | POA: Diagnosis not present

## 2023-09-01 DIAGNOSIS — M6281 Muscle weakness (generalized): Secondary | ICD-10-CM | POA: Diagnosis not present

## 2023-09-01 DIAGNOSIS — M542 Cervicalgia: Secondary | ICD-10-CM | POA: Diagnosis not present

## 2023-09-01 NOTE — Therapy (Unsigned)
 OUTPATIENT PHYSICAL THERAPY CERVICAL TREATMENT Progress Note Reporting Period 07/25/2023 to 08/29/2023  See note below for Objective Data and Assessment of Progress/Goals.       Patient Name: Ryan SHANKMAN, MD MRN: [id number] DOB:1951/01/17, 73 y.o., male Today's Date: 09/01/2023  END OF SESSION:        Past Medical History:  Diagnosis Date   Aneurysm of ascending aorta (HCC) 06/06/2023   Chronic combined systolic and diastolic CHF (congestive heart failure) (HCC)    Coronary atherosclerosis of native coronary artery    Dental crowns present    Essential hypertension, benign    states under control with meds., has been on med. x 15 yr.   GERD (gastroesophageal reflux disease)    Hepatitis    Drug induced hepatitis   MI (myocardial infarction) (HCC) 10/27/2002   Non-insulin dependent type 2 diabetes mellitus (HCC)    Obesity    Osteoarthritis    hips and knees   Pure hypercholesterolemia    Sleep apnea    Past Surgical History:  Procedure Laterality Date   ANKLE SURGERY Left    as a child   ANTERIOR CERVICAL DECOMP/DISCECTOMY FUSION Left 11/09/2021   Procedure: ANTERIOR CERVICAL DECOMPRESSION/DISCECTOMY FUSION 3 LEVELS C3-C6;  Surgeon: Venita Lick, MD;  Location: [facility];  Service: Orthopedics;  Laterality: Left;  4 hrs 3 C-Bed   ANTERIOR INTEROSSEOUS NERVE DECOMPRESSION Right 04/15/2021   Procedure: ANTERIOR INTEROSSEOUS NERVE DECOMPRESSION;  Surgeon: Dairl Ponder, MD;  Location: [facility];  Service: Orthopedics;  Laterality: Right;  Only need 1 hour for case,  Axillary block/Mac   CARDIAC CATHETERIZATION  10/27/2002   CARPAL TUNNEL RELEASE Right 11/21/2013   CARPAL TUNNEL RELEASE Right 04/15/2021   Procedure: CARPAL TUNNEL RELEASE;  Surgeon: Dairl Ponder, MD;  Location: [facility];  Service: Orthopedics;  Laterality: Right;   COLONOSCOPY WITH PROPOFOL N/A 04/29/2023   Procedure: COLONOSCOPY WITH PROPOFOL;  Surgeon: Jeani Hawking, MD;  Location: [facility];  Service: Gastroenterology;  Laterality: N/A;   CORONARY STENT PLACEMENT  10/27/2002   mid LAD   TOTAL HIP ARTHROPLASTY Left 04/09/1999   TOTAL HIP ARTHROPLASTY Right    TOTAL KNEE ARTHROPLASTY Left 02/03/2011   TOTAL KNEE ARTHROPLASTY Right 05/11/2021   Procedure: TOTAL KNEE ARTHROPLASTY;  Surgeon: Ollen Gross, MD;  Location: [facility];  Service: Orthopedics;  Laterality: Right;   ULNAR TUNNEL RELEASE Left 06/18/2016   Procedure: LEFT CUBITAL TUNNEL RELEASE;  Surgeon: Dairl Ponder, MD;  Location: [facility];  Service: Orthopedics;  Laterality: Left;   Patient Active Problem List   Diagnosis Date Noted   Aneurysm of ascending aorta (HCC) 06/06/2023   Cervical myelopathy (HCC) 11/09/2021   OA (osteoarthritis) of knee 05/11/2021   Primary osteoarthritis of right knee 05/11/2021   Colon cancer screening 10/31/2020   Gastroesophageal reflux disease 10/31/2020   Morbid obesity (HCC) 10/31/2020   History of colonic polyps 10/31/2020   Pain of left hip joint 12/13/2019   AVNRT (AV nodal re-entry tachycardia) (HCC)    Withdrawal arrhythmia (HCC)    Cubital tunnel syndrome, left 06/29/2016   CAD (coronary artery disease), native coronary artery 05/08/2014   Hyperlipidemia 03/21/2013    Class: Chronic   Old MI (myocardial infarction) 03/21/2013    Class: Chronic   DM (diabetes mellitus), type 2, uncontrolled 03/21/2013    Class: Chronic   Essential hypertension, benign 03/21/2013    Class: Chronic   Obesity (BMI 30-39.9) 03/21/2013   Chronic combined systolic and diastolic  CHF (congestive heart failure) (HCC)      REFERRING PROVIDER: Doretha Sou, MD   REFERRING DIAG: [phone] (ICD-10-CM) - Other spondylosis with myelopathy, cervical region   THERAPY DIAG:  No diagnosis found.  Rationale for Evaluation and Treatment: Rehabilitation  ONSET DATE: DOS  11/19/2022  SUBJECTIVE:                                                                                                                                                                                                          SUBJECTIVE STATEMENT: Pt denies any adverse effects after prior Rx.  Pt denies pain currently.  "I can definitely tell my reach is better." He still has deficits with strength, walking, balance, and hand dexterity.       Hand dominance: Right  PERTINENT HISTORY:  CHF, MI, coronary artery disease with stent placement, HTN, tachycardia, and Aneurysm of ascending aorta   DM type 2 OA  PSHx: Cervical fusion C3-6 10/2021 and C1-C3 posterior spinal fusion 11/19/2022 Bilat THA (2000 and 2002) Bilat TKA  (2012 and 2022) R wrist CTR R Forearm/Anterior interrosseous nerve decompression 03/2021 L Ulnar tunnel release L ankle surgery (child)   PAIN:  NPRS:  0/10 current and best, 5-6/10 worst  Location:  R UT, R sided cervical Type:  Numbness in bilat feet, R > L and bilat hands.  Burning in L hand. Aggravating factors:  Looking to the Right for a prolonged time including looking at the computer screen. Easing factors:  having correct posture    PRECAUTIONS: Other: cervical fusion x 2, bilat THA, bilat TKA, aneurysm of ascending aorta     WEIGHT BEARING RESTRICTIONS: none indicated  FALLS:  Has patient fallen in last 6 months? 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    PLOF: Independent  Pt has been using the cane since the 1st cervical fusion surgery.  Pt had better quality of gait/ambulation prior to the 2nd cervical fusion.  PATIENT GOALS: improve strength and coordination.    OBJECTIVE:  Note: Objective measures were completed at Evaluation unless otherwise noted.  DIAGNOSTIC FINDINGS:     TREATMENT DATE:        4/10 Shoulder pulleys  in flexion and scaption Shelf reaches 2x10 bilat Elbow extension with RTB 2x15 Sidestepping x 3 laps Marching on airex x 20 reps with bilat  UE support Pt ambulated bwds with 1 UE assist on rail x 2 laps  Manual Therapy:  STM and TPR to bilat UT in sitting.   4/7 Shoulder Flexion AROM: Initial/Current: R: 102/141, L: 113/147 deg    MMT and HHD Right eval Left eval Right 4/7 Left 4/7  Shoulder flexion                 Hip abduction 21.1 25 29.3 28.9  Shoulder adduction        Shoulder extension        Shoulder internal rotation        Shoulder external rotation        Elbow flexion 5/5 5/5    Elbow extension 4/5 seated 4/5 seated 4+/5 seated 4-/5 seated  Wrist flexion 4+ to 5/5  4/5    Wrist extension Weakness in bilat though worse on R      Knee flexion 5/5 seated 5/5 seated    Knee extension 5/5 5/5    DF 5/5 5/5    PF WFL seated WFL seated     (Blank rows = not tested)   Active ROM A/PROM (deg) eval 08/17/23 4/7  Flexion 13 62   Extension 32 25   Right lateral flexion       Left lateral flexion       Right rotation 22 26 31   Left rotation 20 30 26    (Blank rows = not tested) Pt had no pain with cervical AROM   Shoulder pulleys in flexion and scaption Shelf reaches 3x10 bilat Seated hz abd with YTB Sidestepping x 3 laps   UEFI:  62/80    4/3  Therapeutic Activity: Shelf reach in standing 2 x 10 bilat Sidestepping x 3 laps without UE support Marching on Airex x 15 with UE support  Ther Ex: Pulleys in flexion and scaption Rows with GTB 2x10 Standing shoulder extension with RTB 2x15 Standing elbow extension with RTB 2x15 Seated Hz abd with YTB 2x10 Standing on airex with FA 2x30 sec and NBOS 2x30 sec  Reviewed and updated HEP.  Pt requested a handout of all exercises.  PT printed out a HEP and gave pt a handout. PT educated pt in appropriate frequency of home exercises.   4/1 Manual:  STM and to suboccipitals and paraspinals in supine. STM and  TPR to bilat UT seated  Therapeutic Activity: Shelf reach in standing 2 x 10 bilat Sidestepping x 3 laps without UE support  Ther Ex: Pulleys in flexion and scaption Rows with GTB 2x10 Standing shoulder extension with RTB 2x10 Seated Hz abd with YTB 2x10   3/28   Shoulder Flexion  AROM:  Initial/Current: R:  102/137, L:  113/146 deg  Manual:  STM and to suboccipitals and paraspinals in supine. STM and TPR to bilat UT seated  Ther-ex: Supine shoulder FLEX with cane 2x10 Elbow extension with RTB 2x15 Banded rows x10, 15 reps with RTB Shoulder extension with retraction x10,15 reps with RTB  Ther-Act Reaching behind head 2x10 bilat Reaching up x10 bilat (seated) Shelf reach in standing 2 x 10 bilat Reaching behind back x10 bilat    3/26: Manual: All PROM performed with distraction to reduce pain and improve movement STM and trigger point release to suboccipitals, paraspinals, and upper traps There-ex: Supine shoulder FLEX with cane no weight 2x15 RPE 3 Shoulder shrugs 3x10 4 lbs RPE 5 There-Act Reaching behind head 2x10 Reaching up 2x10 Reaching behind back 2x10 Neuro-Re-ed  Banded  rows 2x10 red RTB RPE 5 Banded pulldowns 3x10 red RTB RPE 5         PATIENT EDUCATION:  Education details:  PT answered questions.  relevant anatomy, POC, rationale of interventions, exercise form and HEP.  Person educated: Patient Education method: Explanation, demonstration, verbal and tactile cues, handout Education comprehension: verbalized understanding, returned demonstration, verbal and tactile cues required  HOME EXERCISE PROGRAM: Access Code: [email or web address] URL: [email or web address] Date: 08/04/2023 Prepared by: Aaron Edelman  Exercises - Thumb Opposition  - 2 x daily - 7 x weekly - 2-3 sets - 10 reps - Standing Shoulder Row with Anchored Resistance  - 1 x daily - 4-5 x weekly - 2 sets - 10-15 reps - Shoulder extension with resistance - Neutral  - 1 x  daily - 4-5 x weekly - 2 sets - 10 reps  ASSESSMENT:  CLINICAL IMPRESSION: Treatment time limited today due to pt arriving late.  Pt is improving with function as evidenced by subjective reports.   Pt reports 50% improvement in his gait and balance.  He is able to reach higher at home though still reports limitations with reaching behind his head.  Though pt is improving, he still reports deficits with strength, walking, balance, and hand dexterity.  Pt demonstrates improved bilat hip abduction strength as evidenced by HHD testing.  He has improved with R elbow extension strength though has worse L elbow extension strength.  Pt demonstrates improved cervical rotation AROM and bilat shoulder flexion AROM as evidenced by goniometric testing.  He responded well to Rx having no c/o's after Rx.  He has met all his STG's and partially met LTG #2.  He should benefit from cont skilled PT to address impairments and ongoing goals and to restore desired level of function.     OBJECTIVE IMPAIRMENTS: Abnormal gait, decreased activity tolerance, decreased balance, decreased endurance, decreased mobility, difficulty walking, decreased ROM, decreased strength, hypomobility, impaired flexibility, impaired UE functional use, postural dysfunction, and pain.   ACTIVITY LIMITATIONS: squatting, transfers, reach over head, and locomotion level  PARTICIPATION LIMITATIONS: cleaning, shopping, and community activity  PERSONAL FACTORS: 3+ comorbidities: bilat THA, bilat TKA, CHF, DM, OA  are also affecting patient's functional outcome.   REHAB POTENTIAL: Good  CLINICAL DECISION MAKING: Stable/uncomplicated  EVALUATION COMPLEXITY: Low   GOALS:   SHORT TERM GOALS: Target date: 08/15/23   Pt will be independent and compliant with HEP for improved strength, pain, and mobility.  Baseline:  Goal status: 3/26 Pt met goal  2.  Pt will demo at least 10-15 deg in bilat shoulder flexion AROM for improved reaching.   Baseline:  Goal status: 3/26 Pt met goal  3.  Pt will report at least a 25% improvement in his gait and balance.   Baseline:  Goal status: 3/26 Pt met goal  4.  Pt will tolerate postural/scapular strengthening without adverse effects for improved stability and posture with daily and work activities and to reduce stress on cervical.  Baseline:  Goal status: 3/26 Pt met goal     LONG TERM GOALS: Target date: 10/03/2023  Pt will demo improved bilat elbow extension strength to 5/5 and bilat hip abd strength by at least 5-8 lbs for improved performance of transfers, mobility, and gait.  Baseline:  Goal status: PROGRESSING  4/7  2.  Pt will report at least a 70% improvement in his gait and balance.  Baseline:  Goal status: 70% MET  4/7  3.  Pt will demo improved posture with gait  and improved step length bilat. Baseline:  Goal status: ONGOING    PLAN:  PT FREQUENCY: 2x/week  PT DURATION:  5 weeks  PLANNED INTERVENTIONS: 97164- PT Re-evaluation, 97110-Therapeutic exercises, 97530- Therapeutic activity, [id number]- Neuromuscular re-education, 97535- Self Care, [id number]- Manual therapy, [phone]- Gait training, [phone]- Aquatic Therapy, Patient/Family education, Balance training, Stair training, Taping, Dry Needling, Joint mobilization, Cryotherapy, and Moist heat  PLAN FOR NEXT SESSION: Postural/scapular strengthening with theraband, elbow strengthening, balance and gait.      Audie Clear III PT, DPT 09/01/23 9:07 AM

## 2023-09-05 ENCOUNTER — Encounter (HOSPITAL_BASED_OUTPATIENT_CLINIC_OR_DEPARTMENT_OTHER): Payer: Self-pay | Admitting: Physical Therapy

## 2023-09-05 ENCOUNTER — Ambulatory Visit (HOSPITAL_BASED_OUTPATIENT_CLINIC_OR_DEPARTMENT_OTHER): Admitting: Physical Therapy

## 2023-09-05 DIAGNOSIS — R2689 Other abnormalities of gait and mobility: Secondary | ICD-10-CM

## 2023-09-05 DIAGNOSIS — M6281 Muscle weakness (generalized): Secondary | ICD-10-CM | POA: Diagnosis not present

## 2023-09-05 DIAGNOSIS — M25612 Stiffness of left shoulder, not elsewhere classified: Secondary | ICD-10-CM

## 2023-09-05 DIAGNOSIS — M542 Cervicalgia: Secondary | ICD-10-CM

## 2023-09-05 DIAGNOSIS — M25611 Stiffness of right shoulder, not elsewhere classified: Secondary | ICD-10-CM | POA: Diagnosis not present

## 2023-09-05 NOTE — Therapy (Signed)
 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, MD MRN: [id number] DOB:1951/01/21, 73 y.o., male Today's Date: 09/05/2023  END OF SESSION:  PT End of Session - 09/05/23 0810     Visit Number 12    Number of Visits 20    Date for PT Re-Evaluation 10/03/23    Authorization Type MCR A and B    PT Start Time 0807    PT Stop Time 0849    PT Time Calculation (min) 42 min    Activity Tolerance Patient tolerated treatment well;No increased pain    Behavior During Therapy [facility] for tasks assessed/performed                  Past Medical History:  Diagnosis Date   Aneurysm of ascending aorta (HCC) 06/06/2023   Chronic combined systolic and diastolic CHF (congestive heart failure) (HCC)    Coronary atherosclerosis of native coronary artery    Dental crowns present    Essential hypertension, benign    states under control with meds., has been on med. x 15 yr.   GERD (gastroesophageal reflux disease)    Hepatitis    Drug induced hepatitis   MI (myocardial infarction) (HCC) 10/27/2002   Non-insulin dependent type 2 diabetes mellitus (HCC)    Obesity    Osteoarthritis    hips and knees   Pure hypercholesterolemia    Sleep apnea    Past Surgical History:  Procedure Laterality Date   ANKLE SURGERY Left    as a child   ANTERIOR CERVICAL DECOMP/DISCECTOMY FUSION Left 11/09/2021   Procedure: ANTERIOR CERVICAL DECOMPRESSION/DISCECTOMY FUSION 3 LEVELS C3-C6;  Surgeon: Venita Lick, MD;  Location: [facility];  Service: Orthopedics;  Laterality: Left;  4 hrs 3 C-Bed   ANTERIOR INTEROSSEOUS NERVE DECOMPRESSION Right 04/15/2021   Procedure: ANTERIOR INTEROSSEOUS NERVE DECOMPRESSION;  Surgeon: Dairl Ponder, MD;  Location: [facility];  Service: Orthopedics;  Laterality: Right;  Only need 1 hour for case,  Axillary block/Mac   CARDIAC CATHETERIZATION  10/27/2002   CARPAL TUNNEL RELEASE Right 11/21/2013   CARPAL TUNNEL RELEASE Right  04/15/2021   Procedure: CARPAL TUNNEL RELEASE;  Surgeon: Dairl Ponder, MD;  Location: [facility];  Service: Orthopedics;  Laterality: Right;   COLONOSCOPY WITH PROPOFOL N/A 04/29/2023   Procedure: COLONOSCOPY WITH PROPOFOL;  Surgeon: Jeani Hawking, MD;  Location: [facility];  Service: Gastroenterology;  Laterality: N/A;   CORONARY STENT PLACEMENT  10/27/2002   mid LAD   TOTAL HIP ARTHROPLASTY Left 04/09/1999   TOTAL HIP ARTHROPLASTY Right    TOTAL KNEE ARTHROPLASTY Left 02/03/2011   TOTAL KNEE ARTHROPLASTY Right 05/11/2021   Procedure: TOTAL KNEE ARTHROPLASTY;  Surgeon: Ollen Gross, MD;  Location: [facility];  Service: Orthopedics;  Laterality: Right;   ULNAR TUNNEL RELEASE Left 06/18/2016   Procedure: LEFT CUBITAL TUNNEL RELEASE;  Surgeon: Dairl Ponder, MD;  Location:  [facility];  Service: Orthopedics;  Laterality: Left;   Patient Active Problem List   Diagnosis Date Noted   Aneurysm of ascending aorta (HCC) 06/06/2023   Cervical myelopathy (HCC) 11/09/2021   OA (osteoarthritis) of knee 05/11/2021   Primary osteoarthritis of right knee 05/11/2021   Colon cancer screening 10/31/2020   Gastroesophageal reflux disease 10/31/2020   Morbid obesity (HCC) 10/31/2020   History of colonic polyps 10/31/2020   Pain of left hip joint 12/13/2019   AVNRT (AV nodal re-entry tachycardia) (HCC)    Withdrawal arrhythmia (HCC)  Cubital tunnel syndrome, left 06/29/2016   CAD (coronary artery disease), native coronary artery 05/08/2014   Hyperlipidemia 03/21/2013    Class: Chronic   Old MI (myocardial infarction) 03/21/2013    Class: Chronic   DM (diabetes mellitus), type 2, uncontrolled 03/21/2013    Class: Chronic   Essential hypertension, benign 03/21/2013    Class: Chronic   Obesity (BMI 30-39.9) 03/21/2013   Chronic combined systolic and diastolic CHF (congestive heart failure) (HCC)      REFERRING PROVIDER: Armandina Lana, MD   REFERRING  DIAG: [phone] (ICD-10-CM) - Other spondylosis with myelopathy, cervical region   THERAPY DIAG:  Muscle weakness (generalized)  Other abnormalities of gait and mobility  Cervicalgia  Stiffness of right shoulder, not elsewhere classified  Stiffness of left shoulder, not elsewhere classified  Rationale for Evaluation and Treatment: Rehabilitation  ONSET DATE: DOS  11/19/2022  SUBJECTIVE:                                                                                                                                                                                                         SUBJECTIVE STATEMENT: Pt states he felt good after prior Rx.  Pt denies pain currently.  Pt states his walking is better and his balance is a little better.  Pt reports his hand dexterity is slowly improving.  Pt reports he's been doing his HEP a little bit.       Hand dominance: Right  PERTINENT HISTORY:  CHF, MI, coronary artery disease with stent placement, HTN, tachycardia, and Aneurysm of ascending aorta   DM type 2 OA  PSHx: Cervical fusion C3-6 10/2021 and C1-C3 posterior spinal fusion 11/19/2022 Bilat THA (2000 and 2002) Bilat TKA  (2012 and 2022) R wrist CTR R Forearm/Anterior interrosseous nerve decompression 03/2021 L Ulnar tunnel release L ankle surgery (child)   PAIN:  NPRS:  0/10 current and best, 5-6/10 worst  Location:  R UT, R sided cervical Type:  Numbness in bilat feet, R > L and bilat hands.  Burning in L hand. Aggravating factors:  Looking to the Right for a prolonged time including looking at the computer screen. Easing factors:  having correct posture    PRECAUTIONS: Other: cervical fusion x 2, bilat THA, bilat TKA, aneurysm of ascending aorta     WEIGHT BEARING RESTRICTIONS: none indicated  FALLS:  Has patient fallen in last 6 months? 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  He is not performing surgeries.    PLOF: Independent  Pt has been using the cane since the 1st cervical fusion surgery.  Pt had better quality of gait/ambulation prior to the 2nd cervical fusion.  PATIENT GOALS: improve strength and coordination.    OBJECTIVE:  Note: Objective measures were completed at Evaluation unless otherwise noted.  DIAGNOSTIC FINDINGS:     TREATMENT DATE:        4/14 Therapeutic Exercise: Shoulder pulleys in flexion and scaption Elbow extension with RTB 2x15 Rows with GTB 2x15  Therapeutic Activities: Reaching behind head 2x10 Shelf reaches 2x10 bilat Sidestepping x 3 laps Lateral step ups x 10 bilat Marching on airex approx 20-25 reps with bilat UE support Pt ambulated bwds with 1 UE assist on rail x 2 laps  Manual Therapy:  STM and TPR to bilat UT in sitting and to cervical paraspinals in supine with triangle bolster under knees.   4/10 Therapeutic Exercise: Shoulder pulleys in flexion and scaption Shelf reaches 2x10 bilat Elbow extension with RTB 2x15  Therapeutic Activities: Sidestepping x 3 laps Marching on airex x 20 reps with bilat UE support Pt ambulated bwds with 1 UE assist on rail x 2 laps  Manual Therapy:  STM and TPR to bilat UT in sitting.   4/7 Shoulder Flexion AROM: Initial/Current: R: 102/141, L: 113/147 deg    MMT and HHD Right eval Left eval Right 4/7 Left 4/7  Shoulder flexion                 Hip abduction 21.1 25 29.3 28.9  Shoulder adduction        Shoulder extension        Shoulder internal rotation        Shoulder external rotation        Elbow flexion 5/5 5/5    Elbow extension 4/5 seated 4/5 seated 4+/5 seated 4-/5 seated  Wrist flexion 4+ to 5/5  4/5    Wrist extension Weakness in bilat though worse on R      Knee flexion 5/5 seated 5/5 seated    Knee extension 5/5 5/5    DF 5/5 5/5    PF WFL seated WFL seated     (Blank rows =  not tested)   Active ROM A/PROM (deg) eval 08/17/23 4/7  Flexion 13 62   Extension 32 25   Right lateral flexion       Left lateral flexion       Right rotation 22 26 31   Left rotation 20 30 26    (Blank rows = not tested) Pt had no pain with cervical AROM   Shoulder pulleys in flexion and scaption Shelf reaches 3x10 bilat Seated hz abd with YTB Sidestepping x 3 laps   UEFI:  62/80    4/3  Therapeutic Activity: Shelf reach in standing 2 x 10 bilat Sidestepping x 3 laps without UE support Marching on Airex x 15 with UE support  Ther Ex: Pulleys in flexion and scaption Rows with GTB 2x10 Standing shoulder extension with RTB 2x15 Standing elbow extension with RTB 2x15 Seated Hz abd with YTB 2x10 Standing on airex with FA 2x30 sec and NBOS 2x30 sec  Reviewed and updated HEP.  Pt requested a handout of all exercises.  PT printed out a HEP and gave pt a handout. PT educated pt in appropriate frequency of home exercises.   4/1 Manual:  STM and to  suboccipitals and paraspinals in supine. STM and TPR to bilat UT seated  Therapeutic Activity: Shelf reach in standing 2 x 10 bilat Sidestepping x 3 laps without UE support  Ther Ex: Pulleys in flexion and scaption Rows with GTB 2x10 Standing shoulder extension with RTB 2x10 Seated Hz abd with YTB 2x10   3/28   Shoulder Flexion  AROM:  Initial/Current: R:  102/137, L:  113/146 deg  Manual:  STM and to suboccipitals and paraspinals in supine. STM and TPR to bilat UT seated  Ther-ex: Supine shoulder FLEX with cane 2x10 Elbow extension with RTB 2x15 Banded rows x10, 15 reps with RTB Shoulder extension with retraction x10,15 reps with RTB  Ther-Act Reaching behind head 2x10 bilat Reaching up x10 bilat (seated) Shelf reach in standing 2 x 10 bilat Reaching behind back x10 bilat      PATIENT EDUCATION:  Education details:  PT answered questions.  relevant anatomy, POC, rationale of interventions, exercise  form and HEP.  PT encouraged pt to be compliant with HEP. Person educated: Patient Education method: Explanation, demonstration, verbal and tactile cues, handout Education comprehension: verbalized understanding, returned demonstration, verbal and tactile cues required  HOME EXERCISE PROGRAM: Access Code: [email or web address] URL: [email or web address] Date: 08/04/2023 Prepared by: Marnie Siren    ASSESSMENT:  CLINICAL IMPRESSION: Pt is improving with function as evidenced by subjective reports.  He reports performing his HEP a little and PT educated pt on the importance of being compliant with HEP.  Pt gives good effort with all exercises.  Pt performed functional reaching behind head and requires cuing to keep his head straight and slow down his movement.  Pt continues to require verbal and tactile cuing for correct form with standing shoulder extension though form has improved.  Pt requires UE support with ambulating bwds and marching on airex.  Pt has a trigger point in UT and PT performed STM/TPR to reduce spasm and improve soft tissue mobility and tightness.  He responded well to Rx having no pain and c/o's after Rx.  He should benefit from cont skilled PT to address impairments and ongoing goals and to restore desired level of function.     OBJECTIVE IMPAIRMENTS: Abnormal gait, decreased activity tolerance, decreased balance, decreased endurance, decreased mobility, difficulty walking, decreased ROM, decreased strength, hypomobility, impaired flexibility, impaired UE functional use, postural dysfunction, and pain.   ACTIVITY LIMITATIONS: squatting, transfers, reach over head, and locomotion level  PARTICIPATION LIMITATIONS: cleaning, shopping, and community activity  PERSONAL FACTORS: 3+ comorbidities: bilat THA, bilat TKA, CHF, DM, OA  are also affecting patient's functional outcome.   REHAB POTENTIAL: Good  CLINICAL DECISION MAKING: Stable/uncomplicated  EVALUATION  COMPLEXITY: Low   GOALS:   SHORT TERM GOALS: Target date: 08/15/23   Pt will be independent and compliant with HEP for improved strength, pain, and mobility.  Baseline:  Goal status: 3/26 Pt met goal  2.  Pt will demo at least 10-15 deg in bilat shoulder flexion AROM for improved reaching.  Baseline:  Goal status: 3/26 Pt met goal  3.  Pt will report at least a 25% improvement in his gait and balance.   Baseline:  Goal status: 3/26 Pt met goal  4.  Pt will tolerate postural/scapular strengthening without adverse effects for improved stability and posture with daily and work activities and to reduce stress on cervical.  Baseline:  Goal status: 3/26 Pt met goal     LONG TERM GOALS: Target date: 10/03/2023  Pt will demo improved bilat elbow extension  strength to 5/5 and bilat hip abd strength by at least 5-8 lbs for improved performance of transfers, mobility, and gait.  Baseline:  Goal status: PROGRESSING  4/7  2.  Pt will report at least a 70% improvement in his gait and balance.  Baseline:  Goal status: 70% MET  4/7  3.  Pt will demo improved posture with gait and improved step length bilat. Baseline:  Goal status: ONGOING    PLAN:  PT FREQUENCY: 2x/week  PT DURATION:  5 weeks  PLANNED INTERVENTIONS: 97164- PT Re-evaluation, 97110-Therapeutic exercises, 97530- Therapeutic activity, [id number]- Neuromuscular re-education, 97535- Self Care, 24401- Manual therapy, [phone]- Gait training, [phone]- Aquatic Therapy, Patient/Family education, Balance training, Stair training, Taping, Dry Needling, Joint mobilization, Cryotherapy, and Moist heat  PLAN FOR NEXT SESSION: Postural/scapular strengthening with theraband, elbow strengthening, balance and gait.      Trina Fujita III PT, DPT 09/05/23 10:55 AM

## 2023-09-08 ENCOUNTER — Ambulatory Visit (INDEPENDENT_AMBULATORY_CARE_PROVIDER_SITE_OTHER): Payer: Medicare Other

## 2023-09-08 ENCOUNTER — Ambulatory Visit (HOSPITAL_BASED_OUTPATIENT_CLINIC_OR_DEPARTMENT_OTHER): Admitting: Physical Therapy

## 2023-09-08 ENCOUNTER — Encounter (HOSPITAL_BASED_OUTPATIENT_CLINIC_OR_DEPARTMENT_OTHER): Payer: Self-pay | Admitting: Physical Therapy

## 2023-09-08 DIAGNOSIS — R2689 Other abnormalities of gait and mobility: Secondary | ICD-10-CM

## 2023-09-08 DIAGNOSIS — M25611 Stiffness of right shoulder, not elsewhere classified: Secondary | ICD-10-CM

## 2023-09-08 DIAGNOSIS — I5042 Chronic combined systolic (congestive) and diastolic (congestive) heart failure: Secondary | ICD-10-CM

## 2023-09-08 DIAGNOSIS — I10 Essential (primary) hypertension: Secondary | ICD-10-CM

## 2023-09-08 DIAGNOSIS — M25612 Stiffness of left shoulder, not elsewhere classified: Secondary | ICD-10-CM

## 2023-09-08 DIAGNOSIS — Z5181 Encounter for therapeutic drug level monitoring: Secondary | ICD-10-CM | POA: Diagnosis not present

## 2023-09-08 DIAGNOSIS — M6281 Muscle weakness (generalized): Secondary | ICD-10-CM | POA: Diagnosis not present

## 2023-09-08 DIAGNOSIS — M542 Cervicalgia: Secondary | ICD-10-CM | POA: Diagnosis not present

## 2023-09-08 LAB — ECHOCARDIOGRAM COMPLETE
AR max vel: 2.53 cm2
AV Area VTI: 2.35 cm2
AV Area mean vel: 2.47 cm2
AV Mean grad: 4 mmHg
AV Peak grad: 8.5 mmHg
Ao pk vel: 1.46 m/s
Area-P 1/2: 8.16 cm2
Est EF: 40
S' Lateral: 3.26 cm

## 2023-09-08 NOTE — Therapy (Signed)
 OUTPATIENT PHYSICAL THERAPY CERVICAL TREATMENT       Patient Name: Ryan LANUZA, MD MRN: [id number] DOB:Aug 01, 1950, 73 y.o., male Today's Date: 09/08/2023  END OF SESSION:  PT End of Session - 09/08/23 1000     Visit Number 13    Number of Visits 20    Date for PT Re-Evaluation 10/03/23    Authorization Type MCR A and B    PT Start Time 0950    PT Stop Time 1030    PT Time Calculation (min) 40 min    Activity Tolerance Patient tolerated treatment well;No increased pain    Behavior During Therapy [facility] for tasks assessed/performed                  Past Medical History:  Diagnosis Date   Aneurysm of ascending aorta (HCC) 06/06/2023   Chronic combined systolic and diastolic CHF (congestive heart failure) (HCC)    Coronary atherosclerosis of native coronary artery    Dental crowns present    Essential hypertension, benign    states under control with meds., has been on med. x 15 yr.   GERD (gastroesophageal reflux disease)    Hepatitis    Drug induced hepatitis   MI (myocardial infarction) (HCC) 10/27/2002   Non-insulin dependent type 2 diabetes mellitus (HCC)    Obesity    Osteoarthritis    hips and knees   Pure hypercholesterolemia    Sleep apnea    Past Surgical History:  Procedure Laterality Date   ANKLE SURGERY Left    as a child   ANTERIOR CERVICAL DECOMP/DISCECTOMY FUSION Left 11/09/2021   Procedure: ANTERIOR CERVICAL DECOMPRESSION/DISCECTOMY FUSION 3 LEVELS C3-C6;  Surgeon: Venita Lick, MD;  Location: [facility];  Service: Orthopedics;  Laterality: Left;  4 hrs 3 C-Bed   ANTERIOR INTEROSSEOUS NERVE DECOMPRESSION Right 04/15/2021   Procedure: ANTERIOR INTEROSSEOUS NERVE DECOMPRESSION;  Surgeon: Dairl Ponder, MD;  Location: [facility];  Service: Orthopedics;  Laterality: Right;  Only need 1 hour for case,  Axillary block/Mac   CARDIAC CATHETERIZATION  10/27/2002   CARPAL TUNNEL RELEASE Right 11/21/2013   CARPAL TUNNEL RELEASE Right  04/15/2021   Procedure: CARPAL TUNNEL RELEASE;  Surgeon: Dairl Ponder, MD;  Location: [facility];  Service: Orthopedics;  Laterality: Right;   COLONOSCOPY WITH PROPOFOL N/A 04/29/2023   Procedure: COLONOSCOPY WITH PROPOFOL;  Surgeon: Jeani Hawking, MD;  Location: [facility];  Service: Gastroenterology;  Laterality: N/A;   CORONARY STENT PLACEMENT  10/27/2002   mid LAD   TOTAL HIP ARTHROPLASTY Left 04/09/1999   TOTAL HIP ARTHROPLASTY Right    TOTAL KNEE ARTHROPLASTY Left 02/03/2011   TOTAL KNEE ARTHROPLASTY Right 05/11/2021   Procedure: TOTAL KNEE ARTHROPLASTY;  Surgeon: Ollen Gross, MD;  Location: [facility];  Service: Orthopedics;  Laterality: Right;   ULNAR TUNNEL RELEASE Left 06/18/2016   Procedure: LEFT CUBITAL TUNNEL RELEASE;  Surgeon: Dairl Ponder, MD;  Location: [facility];  Service: Orthopedics;  Laterality: Left;   Patient Active Problem List   Diagnosis Date Noted   Aneurysm of ascending aorta (HCC) 06/06/2023   Cervical myelopathy (HCC) 11/09/2021   OA (osteoarthritis) of knee 05/11/2021   Primary osteoarthritis of right knee 05/11/2021   Colon cancer screening 10/31/2020   Gastroesophageal reflux disease 10/31/2020   Morbid obesity (HCC) 10/31/2020   History of colonic polyps 10/31/2020   Pain of left hip joint 12/13/2019   AVNRT (AV nodal re-entry tachycardia) (HCC)    Withdrawal arrhythmia (HCC)  Cubital tunnel syndrome, left 06/29/2016   CAD (coronary artery disease), native coronary artery 05/08/2014   Hyperlipidemia 03/21/2013    Class: Chronic   Old MI (myocardial infarction) 03/21/2013    Class: Chronic   DM (diabetes mellitus), type 2, uncontrolled 03/21/2013    Class: Chronic   Essential hypertension, benign 03/21/2013    Class: Chronic   Obesity (BMI 30-39.9) 03/21/2013   Chronic combined systolic and diastolic CHF (congestive heart failure) (HCC)      REFERRING PROVIDER: Doretha Sou, MD   REFERRING  DIAG: [phone] (ICD-10-CM) - Other spondylosis with myelopathy, cervical region   THERAPY DIAG:  Muscle weakness (generalized)  Other abnormalities of gait and mobility  Cervicalgia  Stiffness of right shoulder, not elsewhere classified  Stiffness of left shoulder, not elsewhere classified  Rationale for Evaluation and Treatment: Rehabilitation  ONSET DATE: DOS  11/19/2022  SUBJECTIVE:                                                                                                                                                                                                         SUBJECTIVE STATEMENT: Pt denies any adverse effects after prior Rx.  Pt had an echo this AM and states everything looked good.  Pt denies pain currently.  Pt states he hasn't done his HEP.  Pt states he is able to reach and lift UE much better.         Hand dominance: Right  PERTINENT HISTORY:  CHF, MI, coronary artery disease with stent placement, HTN, tachycardia, and Aneurysm of ascending aorta   DM type 2 OA  PSHx: Cervical fusion C3-6 10/2021 and C1-C3 posterior spinal fusion 11/19/2022 Bilat THA (2000 and 2002) Bilat TKA  (2012 and 2022) R wrist CTR R Forearm/Anterior interrosseous nerve decompression 03/2021 L Ulnar tunnel release L ankle surgery (child)   PAIN:  NPRS:  0/10 current and best, 5-6/10 worst  Location:  R UT, R sided cervical Type:  Numbness in bilat feet, R > L and bilat hands.  Burning in L hand. Aggravating factors:  Looking to the Right for a prolonged time including looking at the computer screen. Easing factors:  having correct posture    PRECAUTIONS: Other: cervical fusion x 2, bilat THA, bilat TKA, aneurysm of ascending aorta     WEIGHT BEARING RESTRICTIONS: none indicated  FALLS:  Has patient fallen in last 6 months? 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  He is not performing surgeries.    PLOF: Independent  Pt has been using the cane since the 1st cervical fusion surgery.  Pt had better quality of gait/ambulation prior to the 2nd cervical fusion.  PATIENT GOALS: improve strength and coordination.    OBJECTIVE:  Note: Objective measures were completed at Evaluation unless otherwise noted.  DIAGNOSTIC FINDINGS:     TREATMENT DATE:        4/17 Therapeutic Exercise: Shoulder pulleys in flexion and scaption Elbow extension with RTB 2x15 Rows with GTB 2x15  Therapeutic Activities: Reaching behind head 2x10 Shelf reaches 3x10 bilat Sidestepping x 2 laps, with RTB above his knees x 10 at rail Lateral step ups 2 x 10 bilat Marching on airex approx 25 reps x 2 sets with bilat UE support Pt ambulated bwds without UE assist with SBA x 2 laps  Manual Therapy:  STM to bilat cervical paraspinals in supine with triangle bolster under knees.   4/14 Therapeutic Exercise: Shoulder pulleys in flexion and scaption Elbow extension with RTB 2x15 Rows with GTB 2x15  Therapeutic Activities: Reaching behind head 2x10 Shelf reaches 2x10 bilat Sidestepping x 3 laps Lateral step ups x 10 bilat Marching on airex approx 20-25 reps with bilat UE support Pt ambulated bwds with 1 UE assist on rail x 2 laps  Manual Therapy:  STM and TPR to bilat UT in sitting and to cervical paraspinals in supine with triangle bolster under knees.   4/10 Therapeutic Exercise: Shoulder pulleys in flexion and scaption Shelf reaches 2x10 bilat Elbow extension with RTB 2x15  Therapeutic Activities: Sidestepping x 3 laps Marching on airex x 20 reps with bilat UE support Pt ambulated bwds with 1 UE assist on rail x 2 laps  Manual Therapy:  STM and TPR to bilat UT in sitting.   4/7 Shoulder Flexion AROM: Initial/Current: R: 102/141, L: 113/147 deg    MMT and HHD Right eval Left eval  Right 4/7 Left 4/7  Shoulder flexion                 Hip abduction 21.1 25 29.3 28.9  Shoulder adduction        Shoulder extension        Shoulder internal rotation        Shoulder external rotation        Elbow flexion 5/5 5/5    Elbow extension 4/5 seated 4/5 seated 4+/5 seated 4-/5 seated  Wrist flexion 4+ to 5/5  4/5    Wrist extension Weakness in bilat though worse on R      Knee flexion 5/5 seated 5/5 seated    Knee extension 5/5 5/5    DF 5/5 5/5    PF WFL seated WFL seated     (Blank rows = not tested)   Active ROM A/PROM (deg) eval 08/17/23 4/7  Flexion 13 62   Extension 32 25   Right lateral flexion       Left lateral flexion       Right rotation 22 26 31   Left rotation 20 30 26    (Blank rows = not tested) Pt had no pain with cervical AROM   Shoulder pulleys in flexion and scaption Shelf reaches 3x10 bilat Seated hz abd with YTB Sidestepping x 3 laps   UEFI:  62/80    4/3  Therapeutic Activity: Shelf reach in standing 2 x 10 bilat Sidestepping x 3 laps without  UE support Marching on Airex x 15 with UE support  Ther Ex: Pulleys in flexion and scaption Rows with GTB 2x10 Standing shoulder extension with RTB 2x15 Standing elbow extension with RTB 2x15 Seated Hz abd with YTB 2x10 Standing on airex with FA 2x30 sec and NBOS 2x30 sec  Reviewed and updated HEP.  Pt requested a handout of all exercises.  PT printed out a HEP and gave pt a handout. PT educated pt in appropriate frequency of home exercises.   4/1 Manual:  STM and to suboccipitals and paraspinals in supine. STM and TPR to bilat UT seated  Therapeutic Activity: Shelf reach in standing 2 x 10 bilat Sidestepping x 3 laps without UE support  Ther Ex: Pulleys in flexion and scaption Rows with GTB 2x10 Standing shoulder extension with RTB 2x10 Seated Hz abd with YTB 2x10      PATIENT EDUCATION:  Education details:  PT answered questions.  relevant anatomy, POC, rationale of  interventions, exercise form and HEP.  PT encouraged pt to be compliant with HEP. Person educated: Patient Education method: Explanation, demonstration, verbal and tactile cues Education comprehension: verbalized understanding, returned demonstration, verbal and tactile cues required  HOME EXERCISE PROGRAM: Access Code: [email or web address] URL: [email or web address] Date: 08/04/2023 Prepared by: Aaron Edelman    ASSESSMENT:  CLINICAL IMPRESSION: Pt states he hasn't performed his HEP and PT encouraged pt to be compliant with HEP.  Pt gives good effort with all exercises.  He is improving with strength and balance as evidenced by performance of exercises.  Pt was able to ambulate bwd without UE support.  PT did provide SBA with ambulating bwds though pt had no LOB.  Pt continues to require verbal and tactile cuing for correct form with standing shoulder extension though form has improved.   He responded well to Rx having no pain and c/o's after Rx.  He should benefit from cont skilled PT to address impairments and ongoing goals and to restore desired level of function.     OBJECTIVE IMPAIRMENTS: Abnormal gait, decreased activity tolerance, decreased balance, decreased endurance, decreased mobility, difficulty walking, decreased ROM, decreased strength, hypomobility, impaired flexibility, impaired UE functional use, postural dysfunction, and pain.   ACTIVITY LIMITATIONS: squatting, transfers, reach over head, and locomotion level  PARTICIPATION LIMITATIONS: cleaning, shopping, and community activity  PERSONAL FACTORS: 3+ comorbidities: bilat THA, bilat TKA, CHF, DM, OA  are also affecting patient's functional outcome.   REHAB POTENTIAL: Good  CLINICAL DECISION MAKING: Stable/uncomplicated  EVALUATION COMPLEXITY: Low   GOALS:   SHORT TERM GOALS: Target date: 08/15/23   Pt will be independent and compliant with HEP for improved strength, pain, and mobility.  Baseline:  Goal  status: 3/26 Pt met goal  2.  Pt will demo at least 10-15 deg in bilat shoulder flexion AROM for improved reaching.  Baseline:  Goal status: 3/26 Pt met goal  3.  Pt will report at least a 25% improvement in his gait and balance.   Baseline:  Goal status: 3/26 Pt met goal  4.  Pt will tolerate postural/scapular strengthening without adverse effects for improved stability and posture with daily and work activities and to reduce stress on cervical.  Baseline:  Goal status: 3/26 Pt met goal     LONG TERM GOALS: Target date: 10/03/2023  Pt will demo improved bilat elbow extension strength to 5/5 and bilat hip abd strength by at least 5-8 lbs for improved performance of transfers, mobility, and gait.  Baseline:  Goal status: PROGRESSING  4/7  2.  Pt will report at least a 70% improvement in his gait and balance.  Baseline:  Goal status: 70% MET  4/7  3.  Pt will demo improved posture with gait and improved step length bilat. Baseline:  Goal status: ONGOING    PLAN:  PT FREQUENCY: 2x/week  PT DURATION:  5 weeks  PLANNED INTERVENTIONS: [id number]- PT Re-evaluation, 97110-Therapeutic exercises, 97530- Therapeutic activity, [id number]- Neuromuscular re-education, 97535- Self Care, [id number]- Manual therapy, [phone]- Gait training, [phone]- Aquatic Therapy, Patient/Family education, Balance training, Stair training, Taping, Dry Needling, Joint mobilization, Cryotherapy, and Moist heat  PLAN FOR NEXT SESSION: Postural/scapular strengthening with theraband, elbow strengthening, balance and gait.      Trina Fujita III PT, DPT 09/08/23 1:04 PM

## 2023-09-09 LAB — BASIC METABOLIC PANEL WITH GFR
BUN/Creatinine Ratio: 17 (ref 10–24)
BUN: 18 mg/dL (ref 8–27)
CO2: 22 mmol/L (ref 20–29)
Calcium: 9.4 mg/dL (ref 8.6–10.2)
Chloride: 101 mmol/L (ref 96–106)
Creatinine, Ser: 1.03 mg/dL (ref 0.76–1.27)
Glucose: 129 mg/dL — ABNORMAL HIGH (ref 70–99)
Potassium: 4.7 mmol/L (ref 3.5–5.2)
Sodium: 141 mmol/L (ref 134–144)
eGFR: 76 mL/min/{1.73_m2} (ref >59)

## 2023-09-14 ENCOUNTER — Encounter (HOSPITAL_BASED_OUTPATIENT_CLINIC_OR_DEPARTMENT_OTHER): Payer: Self-pay | Admitting: Physical Therapy

## 2023-09-14 ENCOUNTER — Ambulatory Visit (HOSPITAL_BASED_OUTPATIENT_CLINIC_OR_DEPARTMENT_OTHER): Payer: Self-pay | Admitting: Physical Therapy

## 2023-09-14 DIAGNOSIS — M25611 Stiffness of right shoulder, not elsewhere classified: Secondary | ICD-10-CM

## 2023-09-14 DIAGNOSIS — M542 Cervicalgia: Secondary | ICD-10-CM | POA: Diagnosis not present

## 2023-09-14 DIAGNOSIS — M6281 Muscle weakness (generalized): Secondary | ICD-10-CM

## 2023-09-14 DIAGNOSIS — R2689 Other abnormalities of gait and mobility: Secondary | ICD-10-CM | POA: Diagnosis not present

## 2023-09-14 DIAGNOSIS — M25612 Stiffness of left shoulder, not elsewhere classified: Secondary | ICD-10-CM | POA: Diagnosis not present

## 2023-09-14 NOTE — Therapy (Signed)
 OUTPATIENT PHYSICAL THERAPY CERVICAL TREATMENT       Patient Name: Ryan PERLMUTTER, MD MRN: [id number] DOB:[birth year], 73 y.o., male Today's Date: 09/14/2023  END OF SESSION:  PT End of Session - 09/14/23 1352     Visit Number 14    Number of Visits 20    Date for PT Re-Evaluation 10/03/23    Authorization Type MCR A and B    PT Start Time 1348    PT Stop Time 1428    PT Time Calculation (min) 40 min    Activity Tolerance Patient tolerated treatment well;No increased pain    Behavior During Therapy [facility] for tasks assessed/performed                   Past Medical History:  Diagnosis Date   Aneurysm of ascending aorta (HCC) 06/06/2023   Chronic combined systolic and diastolic CHF (congestive heart failure) (HCC)    Coronary atherosclerosis of native coronary artery    Dental crowns present    Essential hypertension, benign    states under control with meds., has been on med. x 15 yr.   GERD (gastroesophageal reflux disease)    Hepatitis    Drug induced hepatitis   MI (myocardial infarction) (HCC) 10/27/2002   Non-insulin  dependent type 2 diabetes mellitus (HCC)    Obesity    Osteoarthritis    hips and knees   Pure hypercholesterolemia    Sleep apnea    Past Surgical History:  Procedure Laterality Date   ANKLE SURGERY Left    as a child   ANTERIOR CERVICAL DECOMP/DISCECTOMY FUSION Left 11/09/2021   Procedure: ANTERIOR CERVICAL DECOMPRESSION/DISCECTOMY FUSION 3 LEVELS C3-C6;  Surgeon: Mort Ards, MD;  Location: [facility];  Service: Orthopedics;  Laterality: Left;  4 hrs 3 C-Bed   ANTERIOR INTEROSSEOUS NERVE DECOMPRESSION Right 04/15/2021   Procedure: ANTERIOR INTEROSSEOUS NERVE DECOMPRESSION;  Surgeon: Florida Hurter, MD;  Location: [facility];  Service: Orthopedics;  Laterality: Right;  Only need 1 hour for case,  Axillary block/Mac   CARDIAC CATHETERIZATION  10/27/2002   CARPAL TUNNEL RELEASE Right 11/21/2013   CARPAL TUNNEL RELEASE  Right 04/15/2021   Procedure: CARPAL TUNNEL RELEASE;  Surgeon: Florida Hurter, MD;  Location: [facility];  Service: Orthopedics;  Laterality: Right;   COLONOSCOPY WITH PROPOFOL  N/A 04/29/2023   Procedure: COLONOSCOPY WITH PROPOFOL ;  Surgeon: Alvis Jourdain, MD;  Location: [facility];  Service: Gastroenterology;  Laterality: N/A;   CORONARY STENT PLACEMENT  10/27/2002   mid LAD   TOTAL HIP ARTHROPLASTY Left 04/09/1999   TOTAL HIP ARTHROPLASTY Right    TOTAL KNEE ARTHROPLASTY Left 02/03/2011   TOTAL KNEE ARTHROPLASTY Right 05/11/2021   Procedure: TOTAL KNEE ARTHROPLASTY;  Surgeon: Liliane Rei, MD;  Location: [facility];  Service: Orthopedics;  Laterality: Right;   ULNAR TUNNEL RELEASE Left 06/18/2016   Procedure: LEFT CUBITAL TUNNEL RELEASE;  Surgeon: Florida Hurter, MD;  Location: [facility];  Service: Orthopedics;  Laterality: Left;   Patient Active Problem List   Diagnosis Date Noted   Aneurysm of ascending aorta (HCC) 06/06/2023   Cervical myelopathy (HCC) 11/09/2021   OA (osteoarthritis) of knee 05/11/2021   Primary osteoarthritis of right knee 05/11/2021   Colon cancer screening 10/31/2020   Gastroesophageal reflux disease 10/31/2020   Morbid obesity (HCC) 10/31/2020   History of colonic polyps 10/31/2020   Pain of left hip joint 12/13/2019   AVNRT (AV nodal re-entry tachycardia) (HCC)    Withdrawal arrhythmia (HCC)  Cubital tunnel syndrome, left 06/29/2016   CAD (coronary artery disease), native coronary artery 05/08/2014   Hyperlipidemia 03/21/2013    Class: Chronic   Old MI (myocardial infarction) 03/21/2013    Class: Chronic   DM (diabetes mellitus), type 2, uncontrolled 03/21/2013    Class: Chronic   Essential hypertension, benign 03/21/2013    Class: Chronic   Obesity (BMI 30-39.9) 03/21/2013   Chronic combined systolic and diastolic CHF (congestive heart failure) (HCC)      REFERRING PROVIDER: Armandina Lana, MD    REFERRING DIAG: [phone] (ICD-10-CM) - Other spondylosis with myelopathy, cervical region   THERAPY DIAG:  Muscle weakness (generalized)  Other abnormalities of gait and mobility  Cervicalgia  Stiffness of right shoulder, not elsewhere classified  Stiffness of left shoulder, not elsewhere classified  Rationale for Evaluation and Treatment: Rehabilitation  ONSET DATE: DOS  11/19/2022  SUBJECTIVE:                                                                                                                                                                                                         SUBJECTIVE STATEMENT: 4/23 Pt denies any pain today. Says he noticed that he can reach up to shelves better.    Hand dominance: Right  PERTINENT HISTORY:  CHF, MI, coronary artery disease with stent placement, HTN, tachycardia, and Aneurysm of ascending aorta   DM type 2 OA  PSHx: Cervical fusion C3-6 10/2021 and C1-C3 posterior spinal fusion 11/19/2022 Bilat THA (2000 and 2002) Bilat TKA  (2012 and 2022) R wrist CTR R Forearm/Anterior interrosseous nerve decompression 03/2021 L Ulnar tunnel release L ankle surgery (child)   PAIN:  NPRS:  0/10 current and best, 5-6/10 worst  Location:  R UT, R sided cervical Type:  Numbness in bilat feet, R > L and bilat hands.  Burning in L hand. Aggravating factors:  Looking to the Right for a prolonged time including looking at the computer screen. Easing factors:  having correct posture    PRECAUTIONS: Other: cervical fusion x 2, bilat THA, bilat TKA, aneurysm of ascending aorta     WEIGHT BEARING RESTRICTIONS: none indicated  FALLS:  Has patient fallen in last 6 months? 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    PLOF: Independent  Pt has been using  the cane since the 1st cervical fusion  surgery.  Pt had better quality of gait/ambulation prior to the 2nd cervical fusion.  PATIENT GOALS: improve strength and coordination.    OBJECTIVE:  Note: Objective measures were completed at Evaluation unless otherwise noted.  DIAGNOSTIC FINDINGS:     TREATMENT DATE:        4/23 There-ex: Pulleys AAROM warm up Pulldowns 3x10 green RTB RPE 5 Rows green RTB 3x10 RPE 5 3 way towel slide on wall 3x  Neuro-Re-ed  Shoulder scaption 3x10 2lbs RPE 5 with mirror  Reach behind back 3x10 both sides with mirror  Reach behind head 3x10 both sides with mirror  Pallof press green RTB 3x10 each side   4/17 Therapeutic Exercise: Shoulder pulleys in flexion and scaption Elbow extension with RTB 2x15 Rows with GTB 2x15  Therapeutic Activities: Reaching behind head 2x10 Shelf reaches 3x10 bilat Sidestepping x 2 laps, with RTB above his knees x 10 at rail Lateral step ups 2 x 10 bilat Marching on airex approx 25 reps x 2 sets with bilat UE support Pt ambulated bwds without UE assist with SBA x 2 laps  Manual Therapy:  STM to bilat cervical paraspinals in supine with triangle bolster under knees.   4/14 Therapeutic Exercise: Shoulder pulleys in flexion and scaption Elbow extension with RTB 2x15 Rows with GTB 2x15  Therapeutic Activities: Reaching behind head 2x10 Shelf reaches 2x10 bilat Sidestepping x 3 laps Lateral step ups x 10 bilat Marching on airex approx 20-25 reps with bilat UE support Pt ambulated bwds with 1 UE assist on rail x 2 laps  Manual Therapy:  STM and TPR to bilat UT in sitting and to cervical paraspinals in supine with triangle bolster under knees.   4/10 Therapeutic Exercise: Shoulder pulleys in flexion and scaption Shelf reaches 2x10 bilat Elbow extension with RTB 2x15  Therapeutic Activities: Sidestepping x 3 laps Marching on airex x 20 reps with bilat UE support Pt ambulated bwds  with 1 UE assist on rail x 2 laps  Manual Therapy:  STM and TPR to bilat UT in sitting.   4/7 Shoulder Flexion AROM: Initial/Current: R: 102/141, L: 113/147 deg    MMT and HHD Right eval Left eval Right 4/7 Left 4/7  Shoulder flexion                 Hip abduction 21.1 25 29.3 28.9  Shoulder adduction        Shoulder extension        Shoulder internal rotation        Shoulder external rotation        Elbow flexion 5/5 5/5    Elbow extension 4/5 seated 4/5 seated 4+/5 seated 4-/5 seated  Wrist flexion 4+ to 5/5  4/5    Wrist extension Weakness in bilat though worse on R      Knee flexion 5/5 seated 5/5 seated    Knee extension 5/5 5/5    DF 5/5 5/5    PF WFL seated WFL seated     (Blank rows = not tested)   Active ROM A/PROM (deg) eval 08/17/23 4/7  Flexion 13 62   Extension 32 25   Right lateral flexion       Left lateral flexion       Right rotation 22 26 31   Left rotation 20 30 26    (Blank rows = not tested) Pt had no pain with cervical AROM   Shoulder pulleys in flexion and scaption Shelf reaches 3x10 bilat  Seated hz abd with YTB Sidestepping x 3 laps   UEFI:  62/80    4/3  Therapeutic Activity: Shelf reach in standing 2 x 10 bilat Sidestepping x 3 laps without UE support Marching on Airex x 15 with UE support  Ther Ex: Pulleys in flexion and scaption Rows with GTB 2x10 Standing shoulder extension with RTB 2x15 Standing elbow extension with RTB 2x15 Seated Hz abd with YTB 2x10 Standing on airex with FA 2x30 sec and NBOS 2x30 sec  Reviewed and updated HEP.  Pt requested a handout of all exercises.  PT printed out a HEP and gave pt a handout. PT educated pt in appropriate frequency of home exercises.   4/1 Manual:  STM and to suboccipitals and paraspinals in supine. STM and TPR to bilat UT seated  Therapeutic Activity: Shelf reach in standing 2 x 10 bilat Sidestepping x 3 laps without UE support  Ther Ex: Pulleys in flexion and  scaption Rows with GTB 2x10 Standing shoulder extension with RTB 2x10 Seated Hz abd with YTB 2x10      PATIENT EDUCATION:  Education details:  PT answered questions.  relevant anatomy, POC, rationale of interventions, exercise form and HEP.  PT encouraged pt to be compliant with HEP. Person educated: Patient Education method: Explanation, demonstration, verbal and tactile cues Education comprehension: verbalized understanding, returned demonstration, verbal and tactile cues required  HOME EXERCISE PROGRAM: Access Code: [email or web address] URL: [email or web address] Date: 08/04/2023 Prepared by: Marnie Siren    ASSESSMENT:  CLINICAL IMPRESSION: 4/23 Pt warmed up on the rope pulleys with no increase in symptoms. Therapeutic exercises were performde focusing on general strengthening and ROM. Neuro exercises were targeted at body positioning and muscle activation using a mirror for visual cueing. Pt is continuing to improve on strength and ROM. Pt will continue to benefit from skilled physical therapy to increase functional strength and ROM for functional ADL's.     OBJECTIVE IMPAIRMENTS: Abnormal gait, decreased activity tolerance, decreased balance, decreased endurance, decreased mobility, difficulty walking, decreased ROM, decreased strength, hypomobility, impaired flexibility, impaired UE functional use, postural dysfunction, and pain.   ACTIVITY LIMITATIONS: squatting, transfers, reach over head, and locomotion level  PARTICIPATION LIMITATIONS: cleaning, shopping, and community activity  PERSONAL FACTORS: 3+ comorbidities: bilat THA, bilat TKA, CHF, DM, OA  are also affecting patient's functional outcome.   REHAB POTENTIAL: Good  CLINICAL DECISION MAKING: Stable/uncomplicated  EVALUATION COMPLEXITY: Low   GOALS:   SHORT TERM GOALS: Target date: 08/15/23   Pt will be independent and compliant with HEP for improved strength, pain, and mobility.  Baseline:  Goal  status: 3/26 Pt met goal  2.  Pt will demo at least 10-15 deg in bilat shoulder flexion AROM for improved reaching.  Baseline:  Goal status: 3/26 Pt met goal  3.  Pt will report at least a 25% improvement in his gait and balance.   Baseline:  Goal status: 3/26 Pt met goal  4.  Pt will tolerate postural/scapular strengthening without adverse effects for improved stability and posture with daily and work activities and to reduce stress on cervical.  Baseline:  Goal status: 3/26 Pt met goal     LONG TERM GOALS: Target date: 10/03/2023  Pt will demo improved bilat elbow extension strength to 5/5 and bilat hip abd strength by at least 5-8 lbs for improved performance of transfers, mobility, and gait.  Baseline:  Goal status: PROGRESSING  4/7  2.  Pt will report at least a 70% improvement in his gait and  balance.  Baseline:  Goal status: 70% MET  4/7  3.  Pt will demo improved posture with gait and improved step length bilat. Baseline:  Goal status: ONGOING    PLAN:  PT FREQUENCY: 2x/week  PT DURATION:  5 weeks  PLANNED INTERVENTIONS: 97164- PT Re-evaluation, 97110-Therapeutic exercises, 97530- Therapeutic activity, [id number]- Neuromuscular re-education, 97535- Self Care, 14782- Manual therapy, [phone]- Gait training, [phone]- Aquatic Therapy, Patient/Family education, Balance training, Stair training, Taping, Dry Needling, Joint mobilization, Cryotherapy, and Moist heat  PLAN FOR NEXT SESSION: Postural/scapular strengthening with theraband, elbow strengthening, balance and gait.      Herminia Lope Zabdiel Dripps SPT 09/14/23 5:26 PM

## 2023-09-15 ENCOUNTER — Ambulatory Visit (INDEPENDENT_AMBULATORY_CARE_PROVIDER_SITE_OTHER): Payer: Medicare Other | Admitting: Cardiovascular Disease

## 2023-09-15 VITALS — BP 110/68 | HR 98 | Resp 16 | Ht 64.0 in | Wt 204.0 lb

## 2023-09-15 DIAGNOSIS — E669 Obesity, unspecified: Secondary | ICD-10-CM

## 2023-09-15 DIAGNOSIS — I25118 Atherosclerotic heart disease of native coronary artery with other forms of angina pectoris: Secondary | ICD-10-CM | POA: Diagnosis not present

## 2023-09-15 DIAGNOSIS — I10 Essential (primary) hypertension: Secondary | ICD-10-CM | POA: Diagnosis not present

## 2023-09-15 DIAGNOSIS — I5042 Chronic combined systolic (congestive) and diastolic (congestive) heart failure: Secondary | ICD-10-CM

## 2023-09-15 DIAGNOSIS — I4719 Other supraventricular tachycardia: Secondary | ICD-10-CM

## 2023-09-15 DIAGNOSIS — I7121 Aneurysm of the ascending aorta, without rupture: Secondary | ICD-10-CM

## 2023-09-15 DIAGNOSIS — E7849 Other hyperlipidemia: Secondary | ICD-10-CM

## 2023-09-15 MED ORDER — METOPROLOL SUCCINATE ER 200 MG PO TB24
200.0000 mg | ORAL_TABLET | Freq: Every day | ORAL | 3 refills | Status: AC
  Filled 2023-09-15: qty 90, 90d supply, fill #0
  Filled 2023-09-23 – 2023-12-08 (×3): qty 90, 90d supply, fill #1
  Filled 2023-12-20 – 2024-03-06 (×3): qty 90, 90d supply, fill #2
  Filled 2024-05-31: qty 90, 90d supply, fill #3

## 2023-09-15 NOTE — Patient Instructions (Signed)
 Medication Instructions:  STOP DILTIAZEM    INCREASE METOPROLOL  TO 200  MG DAILY   TAKE AFTERNOON DOSE OF FUROSEMIDE  SATURDAY AND SUNDAY   *If you need a refill on your cardiac medications before your next appointment, please call your pharmacy*  Lab Work: NONE   Testing/Procedures: Your physician has requested that you have an echocardiogram. Echocardiography is a painless test that uses sound waves to create images of your heart. It provides your doctor with information about the size and shape of your heart and how well your heart's chambers and valves are working. This procedure takes approximately one hour. There are no restrictions for this procedure. Please do NOT wear cologne, perfume, aftershave, or lotions (deodorant is allowed). Please arrive 15 minutes prior to your appointment time.  Please note: We ask at that you not bring children with you during ultrasound (echo/ vascular) testing. Due to room size and safety concerns, children are not allowed in the ultrasound rooms during exams. Our front office staff cannot provide observation of children in our lobby area while testing is being conducted. An adult accompanying a patient to their appointment will only be allowed in the ultrasound room at the discretion of the ultrasound technician under special circumstances. We apologize for any inconvenience. IN 6 MONTHS   Follow-Up: At [facility], you and your health needs are our priority.  As part of our continuing mission to provide you with exceptional heart care, our providers are all part of one team.  This team includes your primary Cardiologist (physician) and Advanced Practice Providers or APPs (Physician Assistants and Nurse Practitioners) who all work together to provide you with the care you need, when you need it.  Your next appointment:   AFTER ECHO 6 month(s)  Provider:   Maudine Sos, MD, Slater Duncan, NP, or Neomi Banks, NP   We recommend  signing up for the patient portal called "MyChart".  Sign up information is provided on this After Visit Summary.  MyChart is used to connect with patients for Virtual Visits (Telemedicine).  Patients are able to view lab/test results, encounter notes, upcoming appointments, etc.  Non-urgent messages can be sent to your provider as well.   To learn more about what you can do with MyChart, go to [email or web address].

## 2023-09-15 NOTE — Progress Notes (Signed)
 Cardiology Office Note:  .   Date:  09/20/2023  ID:  Ryan Joiner, MD, DOB January 09, 1951, MRN [id number] PCP: Jeannine Milroy., MD  [facility] Providers Cardiologist:  Mickiel Albany, MD (Inactive)    History of Present Illness: .   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  He was previously a patient of [name].  He was last seen 01/2022 and was doing well at that time.  He has a history of prior anterior MI treated with PCI.  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  He notes chronic LE edema that is worse than usual at this time.  He denies orthopnea or PND.  He uses compression stockings daily and rarely takes extra furosemide .    He was seen in the ED 04/2023 with an episode of SVT.  He is unclear what precipitated this event as he was taking his carvedilol  as prescribed.    At his visit 05/2023 carvedilol  was switched to metoprolol .  He was aslo switched from ramipriol to Entresto .  Echo 08/2023 revealed LVEF 40% with ascending aorta 4.1 cm.   Discussed the use of AI scribe software for clinical note transcription with the patient, who gave verbal consent to proceed.  History of Present Illness Dr. [name] reports that he feels 'pretty good' overall with improved energy levels and reduced swelling since the last medication adjustment. No issues with breathing, chest pain, or pressure, and no orthopnea. His weight has plateaued, which he attributes to a less strict diet since returning home from a three-month inpatient rehabilitation following surgery. He continues physical therapy and plans to increase his exercise regimen, including using a home weight room.  He is currently taking Mounjaro  at 12.5 mg and is considering increasing the dose to 15 mg after consulting with his primary care physician. He has not experienced any  side effects from the current dosage. He is on metoprolol  100 mg, which effectively prevents episodes of supraventricular tachycardia (SVT). He is also taking Lasix  40 mg once daily, with plans to adjust to twice daily over the weekend to manage fluid retention.  He is curious about a supplement called Beribane, which he heard might help with cholesterol and hemoglobin A1c levels. He is concerned about potential interactions with his current medications, particularly anticoagulants and antihypertensives.  No dizziness or lightheadedness despite low BP.  He acknowledges some residual swelling but does not actively monitor his fluid intake, which he describes as average. He finds it challenging to limit salt intake.  ROS:  As per HPI  Studies Reviewed: .       Echo 09/08/23:  1. LV function is depressed with hypokinesis of the inferior , distal  anterior ; akinesis of the distal inferior, distal septal and apical walls  . Left ventricular ejection fraction, by estimation, is 40%. There is  moderate asymmetric left ventricular  hypertrophy. Left ventricular diastolic parameters are consistent with  Grade I diastolic dysfunction (impaired relaxation).   2. Right ventricular systolic function is mildly reduced. The right  ventricular size is normal.   3. Left atrial size was moderately dilated.   4. Trivial mitral valve regurgitation.   5. The aortic valve is tricuspid. Aortic valve regurgitation is not  visualized. Aortic valve sclerosis/calcification is present, without any  evidence of aortic stenosis.   6. Aortic  dilatation noted. There is mild dilatation of the aortic root,  measuring 41 mm. There is mild dilatation of the ascending aorta,  measuring 39 mm.   7. The inferior vena cava is normal in size with greater than 50%  respiratory variability, suggesting right atrial pressure of 3 mmHg.   Risk Assessment/Calculations:        Physical Exam:   VS:  BP 110/68 (BP Location: Right  Arm, Patient Position: Sitting, Cuff Size: Normal)   Pulse 98   Resp 16   Ht 5\' 4"  (1.626 m)   Wt 204 lb (92.5 kg)   SpO2 94%   BMI 35.02 kg/m  , BMI Body mass index is 35.02 kg/m. GENERAL:  Well appearing HEENT: Pupils equal round and reactive, fundi not visualized, oral mucosa unremarkable NECK:  No jugular venous distention, waveform within normal limits, carotid upstroke brisk and symmetric, no bruits, no thyromegaly LUNGS:  Clear to auscultation bilaterally HEART:  RRR.  PMI not displaced or sustained,S1 and S2 within normal limits, no S3, no S4, no clicks, no rubs, no murmurs ABD:  Flat, positive bowel sounds normal in frequency in pitch, no bruits, no rebound, no guarding, no midline pulsatile mass, no hepatomegaly, no splenomegaly EXT:  2 plus pulses throughout, no edema, no cyanosis no clubbing SKIN:  No rashes no nodules NEURO:  Cranial nerves II through XII grossly intact, motor grossly intact throughout PSYCH:  Cognitively intact, oriented to person place and time   ASSESSMENT AND PLAN: .    Assessment & Plan # Heart failure with reduced ejection fraction Ejection fraction decreased to 40%. Current regimen includes metoprolol  and diltiazem . Diltiazem  suboptimal due to potential exacerbation of heart failure. Metoprolol  preferred for heart rate control. Reports improved energy and reduced swelling. - Discontinue diltiazem . - Increase metoprolol  to 200 mg daily.  We can add spironolactone if needed for BP/HF control. -Continue Entresto  and Farxiga .   - Reassess with echocardiogram in 6 months. - Advise fluid restriction to 1.5 liters per day. - Recommend reducing dietary sodium.  # Supraventricular tachycardia (SVT) No recent SVT episodes. Metoprolol  effectively prevents SVT.  Increasing dose and stopping diltiazem  as above.   # Peripheral edema Peripheral edema improved. Current management with Lasix  40 mg daily. Additional diuretic therapy may be beneficial. - Advise  additional Lasix  40 mg dose in early afternoon over the weekend for fluid management.  # Obesity Obesity management with Mounjaro  12.5 mg. Reports weight plateau. Plans to discuss increasing Mounjaro  to 15 mg with primary care provider. Engaging in physical therapy and plans to increase home physical activity. No significant side effects with current Mounjaro  dosage. - Discuss Mounjaro  dose increase to 15 mg with primary care provider. - Encourage increased physical activity and exercise.  # Follow-up Follow-up plans to monitor heart failure management and medication adjustments. Awaiting pharmacist's input on Beribane supplement interaction. - Follow up post-echocardiogram in 6 months. - Send MyChart message regarding pharmacist's input on Beribane supplement interaction.      Dispo: f/u in 6 months  Signed, Maudine Sos, MD

## 2023-09-16 ENCOUNTER — Other Ambulatory Visit (HOSPITAL_COMMUNITY): Payer: Self-pay

## 2023-09-16 ENCOUNTER — Other Ambulatory Visit: Payer: Self-pay

## 2023-09-19 ENCOUNTER — Ambulatory Visit (HOSPITAL_BASED_OUTPATIENT_CLINIC_OR_DEPARTMENT_OTHER): Admitting: Physical Therapy

## 2023-09-19 ENCOUNTER — Encounter (HOSPITAL_BASED_OUTPATIENT_CLINIC_OR_DEPARTMENT_OTHER): Payer: Self-pay | Admitting: Physical Therapy

## 2023-09-19 DIAGNOSIS — M25612 Stiffness of left shoulder, not elsewhere classified: Secondary | ICD-10-CM | POA: Diagnosis not present

## 2023-09-19 DIAGNOSIS — M25611 Stiffness of right shoulder, not elsewhere classified: Secondary | ICD-10-CM | POA: Diagnosis not present

## 2023-09-19 DIAGNOSIS — M542 Cervicalgia: Secondary | ICD-10-CM

## 2023-09-19 DIAGNOSIS — M6281 Muscle weakness (generalized): Secondary | ICD-10-CM | POA: Diagnosis not present

## 2023-09-19 DIAGNOSIS — R2689 Other abnormalities of gait and mobility: Secondary | ICD-10-CM

## 2023-09-19 NOTE — Therapy (Unsigned)
 OUTPATIENT PHYSICAL THERAPY CERVICAL TREATMENT       Patient Name: Ryan KIBLER, MD MRN: [id number] DOB:[birth year], 73 y.o., male Today's Date: 09/19/2023  END OF SESSION:  PT End of Session - 09/19/23 1345     Visit Number 15    Number of Visits 20    Date for PT Re-Evaluation 10/03/23    Authorization Type MCR A and B    PT Start Time 1319    Activity Tolerance No increased pain;Patient tolerated treatment well    Behavior During Therapy [facility] for tasks assessed/performed                    Past Medical History:  Diagnosis Date   Aneurysm of ascending aorta (HCC) 06/06/2023   Chronic combined systolic and diastolic CHF (congestive heart failure) (HCC)    Coronary atherosclerosis of native coronary artery    Dental crowns present    Essential hypertension, benign    states under control with meds., has been on med. x 15 yr.   GERD (gastroesophageal reflux disease)    Hepatitis    Drug induced hepatitis   MI (myocardial infarction) (HCC) 10/27/2002   Non-insulin  dependent type 2 diabetes mellitus (HCC)    Obesity    Osteoarthritis    hips and knees   Pure hypercholesterolemia    Sleep apnea    Past Surgical History:  Procedure Laterality Date   ANKLE SURGERY Left    as a child   ANTERIOR CERVICAL DECOMP/DISCECTOMY FUSION Left 11/09/2021   Procedure: ANTERIOR CERVICAL DECOMPRESSION/DISCECTOMY FUSION 3 LEVELS C3-C6;  Surgeon: Mort Ards, MD;  Location: [facility];  Service: Orthopedics;  Laterality: Left;  4 hrs 3 C-Bed   ANTERIOR INTEROSSEOUS NERVE DECOMPRESSION Right 04/15/2021   Procedure: ANTERIOR INTEROSSEOUS NERVE DECOMPRESSION;  Surgeon: Florida Hurter, MD;  Location: [facility];  Service: Orthopedics;  Laterality: Right;  Only need 1 hour for case,  Axillary block/Mac   CARDIAC CATHETERIZATION  10/27/2002   CARPAL TUNNEL RELEASE Right 11/21/2013   CARPAL TUNNEL RELEASE Right 04/15/2021   Procedure: CARPAL TUNNEL RELEASE;   Surgeon: Florida Hurter, MD;  Location: [facility];  Service: Orthopedics;  Laterality: Right;   COLONOSCOPY WITH PROPOFOL  N/A 04/29/2023   Procedure: COLONOSCOPY WITH PROPOFOL ;  Surgeon: Alvis Jourdain, MD;  Location: [facility];  Service: Gastroenterology;  Laterality: N/A;   CORONARY STENT PLACEMENT  10/27/2002   mid LAD   TOTAL HIP ARTHROPLASTY Left 04/09/1999   TOTAL HIP ARTHROPLASTY Right    TOTAL KNEE ARTHROPLASTY Left 02/03/2011   TOTAL KNEE ARTHROPLASTY Right 05/11/2021   Procedure: TOTAL KNEE ARTHROPLASTY;  Surgeon: Liliane Rei, MD;  Location: [facility];  Service: Orthopedics;  Laterality: Right;   ULNAR TUNNEL RELEASE Left 06/18/2016   Procedure: LEFT CUBITAL TUNNEL RELEASE;  Surgeon: Florida Hurter, MD;  Location: [facility];  Service: Orthopedics;  Laterality: Left;   Patient Active Problem List   Diagnosis Date Noted   Aneurysm of ascending aorta (HCC) 06/06/2023   Cervical myelopathy (HCC) 11/09/2021   OA (osteoarthritis) of knee 05/11/2021   Primary osteoarthritis of right knee 05/11/2021   Colon cancer screening 10/31/2020   Gastroesophageal reflux disease 10/31/2020   Morbid obesity (HCC) 10/31/2020   History of colonic polyps 10/31/2020   Pain of left hip joint 12/13/2019   AVNRT (AV nodal re-entry tachycardia) (HCC)    Withdrawal arrhythmia (HCC)    Cubital tunnel syndrome, left 06/29/2016   CAD (coronary artery disease), native coronary  artery 05/08/2014   Hyperlipidemia 03/21/2013    Class: Chronic   Old MI (myocardial infarction) 03/21/2013    Class: Chronic   DM (diabetes mellitus), type 2, uncontrolled 03/21/2013    Class: Chronic   Essential hypertension, benign 03/21/2013    Class: Chronic   Obesity (BMI 30-39.9) 03/21/2013   Chronic combined systolic and diastolic CHF (congestive heart failure) (HCC)      REFERRING PROVIDER: Armandina Lana, MD   REFERRING DIAG: [phone] (ICD-10-CM) - Other spondylosis with  myelopathy, cervical region   THERAPY DIAG:  Muscle weakness (generalized)  Cervicalgia  Other abnormalities of gait and mobility  Stiffness of right shoulder, not elsewhere classified  Stiffness of left shoulder, not elsewhere classified  Rationale for Evaluation and Treatment: Rehabilitation  ONSET DATE: DOS  11/19/2022  SUBJECTIVE:                                                                                                                                                                                                         SUBJECTIVE STATEMENT: 4/28 Pt reports no pain today. A little stiffness in the shoulder after mowing the lawn yesterday.    Hand dominance: Right  PERTINENT HISTORY:  CHF, MI, coronary artery disease with stent placement, HTN, tachycardia, and Aneurysm of ascending aorta   DM type 2 OA  PSHx: Cervical fusion C3-6 10/2021 and C1-C3 posterior spinal fusion 11/19/2022 Bilat THA (2000 and 2002) Bilat TKA  (2012 and 2022) R wrist CTR R Forearm/Anterior interrosseous nerve decompression 03/2021 L Ulnar tunnel release L ankle surgery (child)   PAIN:  NPRS:  0/10 current and best, 5-6/10 worst  Location:  R UT, R sided cervical Type:  Numbness in bilat feet, R > L and bilat hands.  Burning in L hand. Aggravating factors:  Looking to the Right for a prolonged time including looking at the computer screen. Easing factors:  having correct posture    PRECAUTIONS: Other: cervical fusion x 2, bilat THA, bilat TKA, aneurysm of ascending aorta     WEIGHT BEARING RESTRICTIONS: none indicated  FALLS:  Has patient fallen in last 6 months? 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    PLOF: Independent  Pt has been using the cane since the 1st cervical fusion surgery.  Pt  had better quality of gait/ambulation prior to the 2nd cervical  fusion.  PATIENT GOALS: improve strength and coordination.    OBJECTIVE:  Note: Objective measures were completed at Evaluation unless otherwise noted.  DIAGNOSTIC FINDINGS:     TREATMENT DATE:        4/28  There-ex: Rope pulleys AAROM Tricep ext green RTB 3x10 Rows green RTB 3x10 Pulldowns green RTB 3x10 Bicep curls 3lbs 3x10  Neuro-Re-ed  Mirror behind head reach 3x10 each arm  Mirror behind back reach 3x10 each arm  3 way towel slide on wall 3x each arm   4/23 There-ex: Pulleys AAROM warm up Pulldowns 3x10 green RTB RPE 5 Rows green RTB 3x10 RPE 5 3 way towel slide on wall 3x  Neuro-Re-ed  Shoulder scaption 3x10 2lbs RPE 5 with mirror  Reach behind back 3x10 both sides with mirror  Reach behind head 3x10 both sides with mirror  Pallof press green RTB 3x10 each side   4/17 Therapeutic Exercise: Shoulder pulleys in flexion and scaption Elbow extension with RTB 2x15 Rows with GTB 2x15  Therapeutic Activities: Reaching behind head 2x10 Shelf reaches 3x10 bilat Sidestepping x 2 laps, with RTB above his knees x 10 at rail Lateral step ups 2 x 10 bilat Marching on airex approx 25 reps x 2 sets with bilat UE support Pt ambulated bwds without UE assist with SBA x 2 laps  Manual Therapy:  STM to bilat cervical paraspinals in supine with triangle bolster under knees.   4/14 Therapeutic Exercise: Shoulder pulleys in flexion and scaption Elbow extension with RTB 2x15 Rows with GTB 2x15  Therapeutic Activities: Reaching behind head 2x10 Shelf reaches 2x10 bilat Sidestepping x 3 laps Lateral step ups x 10 bilat Marching on airex approx 20-25 reps with bilat UE support Pt ambulated bwds with 1 UE assist on rail x 2 laps  Manual Therapy:  STM and TPR to bilat UT in sitting and to cervical paraspinals in supine with triangle bolster under  knees.   4/10 Therapeutic Exercise: Shoulder pulleys in flexion and scaption Shelf reaches 2x10 bilat Elbow extension with RTB 2x15  Therapeutic Activities: Sidestepping x 3 laps Marching on airex x 20 reps with bilat UE support Pt ambulated bwds with 1 UE assist on rail x 2 laps  Manual Therapy:  STM and TPR to bilat UT in sitting.   4/7 Shoulder Flexion AROM: Initial/Current: R: 102/141, L: 113/147 deg    MMT and HHD Right eval Left eval Right 4/7 Left 4/7  Shoulder flexion                 Hip abduction 21.1 25 29.3 28.9  Shoulder adduction        Shoulder extension        Shoulder internal rotation        Shoulder external rotation        Elbow flexion 5/5 5/5    Elbow extension 4/5 seated 4/5 seated 4+/5 seated 4-/5 seated  Wrist flexion 4+ to 5/5  4/5    Wrist extension Weakness in bilat though worse on R      Knee flexion 5/5 seated 5/5 seated    Knee extension 5/5 5/5    DF 5/5 5/5    PF WFL seated WFL seated     (Blank rows = not tested)   Active ROM A/PROM (deg) eval 08/17/23 4/7  Flexion 13 62   Extension 32 25   Right lateral flexion       Left lateral  flexion       Right rotation 22 26 31   Left rotation 20 30 26    (Blank rows = not tested) Pt had no pain with cervical AROM   Shoulder pulleys in flexion and scaption Shelf reaches 3x10 bilat Seated hz abd with YTB Sidestepping x 3 laps   UEFI:  62/80    4/3  Therapeutic Activity: Shelf reach in standing 2 x 10 bilat Sidestepping x 3 laps without UE support Marching on Airex x 15 with UE support  Ther Ex: Pulleys in flexion and scaption Rows with GTB 2x10 Standing shoulder extension with RTB 2x15 Standing elbow extension with RTB 2x15 Seated Hz abd with YTB 2x10 Standing on airex with FA 2x30 sec and NBOS 2x30 sec  Reviewed and updated HEP.  Pt requested a handout of all exercises.  PT printed out a HEP and gave pt a handout. PT educated pt in appropriate frequency of home  exercises.   4/1 Manual:  STM and to suboccipitals and paraspinals in supine. STM and TPR to bilat UT seated  Therapeutic Activity: Shelf reach in standing 2 x 10 bilat Sidestepping x 3 laps without UE support  Ther Ex: Pulleys in flexion and scaption Rows with GTB 2x10 Standing shoulder extension with RTB 2x10 Seated Hz abd with YTB 2x10      PATIENT EDUCATION:  Education details:  PT answered questions.  relevant anatomy, POC, rationale of interventions, exercise form and HEP.  PT encouraged pt to be compliant with HEP. Person educated: Patient Education method: Explanation, demonstration, verbal and tactile cues Education comprehension: verbalized understanding, returned demonstration, verbal and tactile cues required  HOME EXERCISE PROGRAM: Access Code: [email or web address] URL: [email or web address] Date: 08/04/2023 Prepared by: Marnie Siren    ASSESSMENT:  CLINICAL IMPRESSION: 4/28 Pt warmed up on the rope pulleys with no increase in symptoms. Therapeutic exercises were performed focusing on general strengthening and ROM. Neuro exercises were targeted at body positioning and muscle activation using a mirror for visual cueing. Towel slide focused on ROM along with muscle activation keeping shoulders level. Pt is continuing to improve on strength and ROM. Pt will continue to benefit from skilled physical therapy to increase functional strength and ROM for functional ADL's.     OBJECTIVE IMPAIRMENTS: Abnormal gait, decreased activity tolerance, decreased balance, decreased endurance, decreased mobility, difficulty walking, decreased ROM, decreased strength, hypomobility, impaired flexibility, impaired UE functional use, postural dysfunction, and pain.   ACTIVITY LIMITATIONS: squatting, transfers, reach over head, and locomotion level  PARTICIPATION LIMITATIONS: cleaning, shopping, and community activity  PERSONAL FACTORS: 3+ comorbidities: bilat THA, bilat TKA,  CHF, DM, OA  are also affecting patient's functional outcome.   REHAB POTENTIAL: Good  CLINICAL DECISION MAKING: Stable/uncomplicated  EVALUATION COMPLEXITY: Low   GOALS:   SHORT TERM GOALS: Target date: 08/15/23   Pt will be independent and compliant with HEP for improved strength, pain, and mobility.  Baseline:  Goal status: 3/26 Pt met goal  2.  Pt will demo at least 10-15 deg in bilat shoulder flexion AROM for improved reaching.  Baseline:  Goal status: 3/26 Pt met goal  3.  Pt will report at least a 25% improvement in his gait and balance.   Baseline:  Goal status: 3/26 Pt met goal  4.  Pt will tolerate postural/scapular strengthening without adverse effects for improved stability and posture with daily and work activities and to reduce stress on cervical.  Baseline:  Goal status: 3/26 Pt met goal     LONG TERM  GOALS: Target date: 10/03/2023  Pt will demo improved bilat elbow extension strength to 5/5 and bilat hip abd strength by at least 5-8 lbs for improved performance of transfers, mobility, and gait.  Baseline:  Goal status: PROGRESSING  4/7  2.  Pt will report at least a 70% improvement in his gait and balance.  Baseline:  Goal status: 70% MET  4/7  3.  Pt will demo improved posture with gait and improved step length bilat. Baseline:  Goal status: ONGOING    PLAN:  PT FREQUENCY: 2x/week  PT DURATION:  5 weeks  PLANNED INTERVENTIONS: 97164- PT Re-evaluation, 97110-Therapeutic exercises, 97530- Therapeutic activity, [id number]- Neuromuscular re-education, 97535- Self Care, 16109- Manual therapy, [phone]- Gait training, [phone]- Aquatic Therapy, Patient/Family education, Balance training, Stair training, Taping, Dry Needling, Joint mobilization, Cryotherapy, and Moist heat  PLAN FOR NEXT SESSION: Postural/scapular strengthening with theraband, elbow strengthening, balance and gait.      Herminia Lope Terrianna Holsclaw SPT 09/19/23 1:46 PM

## 2023-09-21 ENCOUNTER — Ambulatory Visit (HOSPITAL_BASED_OUTPATIENT_CLINIC_OR_DEPARTMENT_OTHER): Admitting: Physical Therapy

## 2023-09-21 ENCOUNTER — Encounter (HOSPITAL_BASED_OUTPATIENT_CLINIC_OR_DEPARTMENT_OTHER): Payer: Self-pay | Admitting: Physical Therapy

## 2023-09-21 DIAGNOSIS — M25612 Stiffness of left shoulder, not elsewhere classified: Secondary | ICD-10-CM | POA: Diagnosis not present

## 2023-09-21 DIAGNOSIS — M6281 Muscle weakness (generalized): Secondary | ICD-10-CM | POA: Diagnosis not present

## 2023-09-21 DIAGNOSIS — M542 Cervicalgia: Secondary | ICD-10-CM | POA: Diagnosis not present

## 2023-09-21 DIAGNOSIS — M25611 Stiffness of right shoulder, not elsewhere classified: Secondary | ICD-10-CM | POA: Diagnosis not present

## 2023-09-21 DIAGNOSIS — R2689 Other abnormalities of gait and mobility: Secondary | ICD-10-CM | POA: Diagnosis not present

## 2023-09-21 NOTE — Therapy (Signed)
 OUTPATIENT PHYSICAL THERAPY CERVICAL TREATMENT       Patient Name: Ryan TRUSH, MD MRN: [id number] DOB:Oct 12, 1950, 73 y.o., male Today's Date: 09/21/2023  END OF SESSION:  PT End of Session - 09/21/23 1154     Visit Number 16    Number of Visits 20    Date for PT Re-Evaluation 10/03/23    Authorization Type MCR A and B    PT Start Time 1152    PT Stop Time 1230    PT Time Calculation (min) 38 min    Activity Tolerance Patient tolerated treatment well;No increased pain    Behavior During Therapy [facility] for tasks assessed/performed                     Past Medical History:  Diagnosis Date   Aneurysm of ascending aorta (HCC) 06/06/2023   Chronic combined systolic and diastolic CHF (congestive heart failure) (HCC)    Coronary atherosclerosis of native coronary artery    Dental crowns present    Essential hypertension, benign    states under control with meds., has been on med. x 15 yr.   GERD (gastroesophageal reflux disease)    Hepatitis    Drug induced hepatitis   MI (myocardial infarction) (HCC) 10/27/2002   Non-insulin  dependent type 2 diabetes mellitus (HCC)    Obesity    Osteoarthritis    hips and knees   Pure hypercholesterolemia    Sleep apnea    Past Surgical History:  Procedure Laterality Date   ANKLE SURGERY Left    as a child   ANTERIOR CERVICAL DECOMP/DISCECTOMY FUSION Left 11/09/2021   Procedure: ANTERIOR CERVICAL DECOMPRESSION/DISCECTOMY FUSION 3 LEVELS C3-C6;  Surgeon: Mort Ards, MD;  Location: [facility];  Service: Orthopedics;  Laterality: Left;  4 hrs 3 C-Bed   ANTERIOR INTEROSSEOUS NERVE DECOMPRESSION Right 04/15/2021   Procedure: ANTERIOR INTEROSSEOUS NERVE DECOMPRESSION;  Surgeon: Florida Hurter, MD;  Location: [facility];  Service: Orthopedics;  Laterality: Right;  Only need 1 hour for case,  Axillary block/Mac   CARDIAC CATHETERIZATION  10/27/2002   CARPAL TUNNEL RELEASE Right 11/21/2013   CARPAL TUNNEL RELEASE  Right 04/15/2021   Procedure: CARPAL TUNNEL RELEASE;  Surgeon: Florida Hurter, MD;  Location: [facility];  Service: Orthopedics;  Laterality: Right;   COLONOSCOPY WITH PROPOFOL  N/A 04/29/2023   Procedure: COLONOSCOPY WITH PROPOFOL ;  Surgeon: Alvis Jourdain, MD;  Location: [facility];  Service: Gastroenterology;  Laterality: N/A;   CORONARY STENT PLACEMENT  10/27/2002   mid LAD   TOTAL HIP ARTHROPLASTY Left 04/09/1999   TOTAL HIP ARTHROPLASTY Right    TOTAL KNEE ARTHROPLASTY Left 02/03/2011   TOTAL KNEE ARTHROPLASTY Right 05/11/2021   Procedure: TOTAL KNEE ARTHROPLASTY;  Surgeon: Liliane Rei, MD;  Location: [facility];  Service: Orthopedics;  Laterality: Right;   ULNAR TUNNEL RELEASE Left 06/18/2016   Procedure: LEFT CUBITAL TUNNEL RELEASE;  Surgeon: Florida Hurter, MD;  Location: [facility];  Service: Orthopedics;  Laterality: Left;   Patient Active Problem List   Diagnosis Date Noted   Aneurysm of ascending aorta (HCC) 06/06/2023   Cervical myelopathy (HCC) 11/09/2021   OA (osteoarthritis) of knee 05/11/2021   Primary osteoarthritis of right knee 05/11/2021   Colon cancer screening 10/31/2020   Gastroesophageal reflux disease 10/31/2020   Morbid obesity (HCC) 10/31/2020   History of colonic polyps 10/31/2020   Pain of left hip joint 12/13/2019   AVNRT (AV nodal re-entry tachycardia) (HCC)    Withdrawal arrhythmia (  HCC)    Cubital tunnel syndrome, left 06/29/2016   CAD (coronary artery disease), native coronary artery 05/08/2014   Hyperlipidemia 03/21/2013    Class: Chronic   Old MI (myocardial infarction) 03/21/2013    Class: Chronic   DM (diabetes mellitus), type 2, uncontrolled 03/21/2013    Class: Chronic   Essential hypertension, benign 03/21/2013    Class: Chronic   Obesity (BMI 30-39.9) 03/21/2013   Chronic combined systolic and diastolic CHF (congestive heart failure) (HCC)      REFERRING PROVIDER: Armandina Lana, MD    REFERRING DIAG: [phone] (ICD-10-CM) - Other spondylosis with myelopathy, cervical region   THERAPY DIAG:  Muscle weakness (generalized)  Cervicalgia  Other abnormalities of gait and mobility  Stiffness of right shoulder, not elsewhere classified  Stiffness of left shoulder, not elsewhere classified  Rationale for Evaluation and Treatment: Rehabilitation  ONSET DATE: DOS  11/19/2022  SUBJECTIVE:                                                                                                                                                                                                         SUBJECTIVE STATEMENT: 4/30 Pt reports no pain or stiffness today.    Hand dominance: Right  PERTINENT HISTORY:  CHF, MI, coronary artery disease with stent placement, HTN, tachycardia, and Aneurysm of ascending aorta   DM type 2 OA  PSHx: Cervical fusion C3-6 10/2021 and C1-C3 posterior spinal fusion 11/19/2022 Bilat THA (2000 and 2002) Bilat TKA  (2012 and 2022) R wrist CTR R Forearm/Anterior interrosseous nerve decompression 03/2021 L Ulnar tunnel release L ankle surgery (child)   PAIN:  NPRS:  0/10 current and best, 5-6/10 worst  Location:  R UT, R sided cervical Type:  Numbness in bilat feet, R > L and bilat hands.  Burning in L hand. Aggravating factors:  Looking to the Right for a prolonged time including looking at the computer screen. Easing factors:  having correct posture    PRECAUTIONS: Other: cervical fusion x 2, bilat THA, bilat TKA, aneurysm of ascending aorta     WEIGHT BEARING RESTRICTIONS: none indicated  FALLS:  Has patient fallen in last 6 months? 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    PLOF: Independent  Pt has been using the cane since the 1st cervical fusion  surgery.  Pt had better  quality of gait/ambulation prior to the 2nd cervical fusion.  PATIENT GOALS: improve strength and coordination.    OBJECTIVE:  Note: Objective measures were completed at Evaluation unless otherwise noted.  DIAGNOSTIC FINDINGS:     TREATMENT DATE:        4/30  There-ex: Shoulder pulleys AAROM Shoulder flex 3lb thumbs up 3x10 Standing cable rows 3x10 10lbs Rope pulldowns 3x10 10lbs (likes better than the bands) Pallof walkouts 3x5 each side 5lbs Rope hammer curls 3x10 5lbs Rope ext 3x10 5lbs Neuro-Re-ed  Cross shoulder flex PNF D2 2x10  Standing wall ball circles 3x10 each arm  Wall clocks 2lb 3x12   4/28  There-ex: Rope pulleys AAROM Tricep ext green RTB 3x10 Rows green RTB 3x10 Pulldowns green RTB 3x10 Bicep curls 3lbs 3x10  Neuro-Re-ed  Mirror behind head reach 3x10 each arm  Mirror behind back reach 3x10 each arm  3 way towel slide on wall 3x each arm   4/23 There-ex: Pulleys AAROM warm up Pulldowns 3x10 green RTB RPE 5 Rows green RTB 3x10 RPE 5 3 way towel slide on wall 3x  Neuro-Re-ed  Shoulder scaption 3x10 2lbs RPE 5 with mirror  Reach behind back 3x10 both sides with mirror  Reach behind head 3x10 both sides with mirror  Pallof press green RTB 3x10 each side   4/17 Therapeutic Exercise: Shoulder pulleys in flexion and scaption Elbow extension with RTB 2x15 Rows with GTB 2x15  Therapeutic Activities: Reaching behind head 2x10 Shelf reaches 3x10 bilat Sidestepping x 2 laps, with RTB above his knees x 10 at rail Lateral step ups 2 x 10 bilat Marching on airex approx 25 reps x 2 sets with bilat UE support Pt ambulated bwds without UE assist with SBA x 2 laps  Manual Therapy:  STM to bilat cervical paraspinals in supine with triangle bolster under knees.   4/14 Therapeutic Exercise: Shoulder pulleys in flexion and scaption Elbow extension with RTB  2x15 Rows with GTB 2x15  Therapeutic Activities: Reaching behind head 2x10 Shelf reaches 2x10 bilat Sidestepping x 3 laps Lateral step ups x 10 bilat Marching on airex approx 20-25 reps with bilat UE support Pt ambulated bwds with 1 UE assist on rail x 2 laps  Manual Therapy:  STM and TPR to bilat UT in sitting and to cervical paraspinals in supine with triangle bolster under knees.   4/10 Therapeutic Exercise: Shoulder pulleys in flexion and scaption Shelf reaches 2x10 bilat Elbow extension with RTB 2x15  Therapeutic Activities: Sidestepping x 3 laps Marching on airex x 20 reps with bilat UE support Pt ambulated bwds with 1 UE assist on rail x 2 laps  Manual Therapy:  STM and TPR to bilat UT in sitting.   4/7 Shoulder Flexion AROM: Initial/Current: R: 102/141, L: 113/147 deg    MMT and HHD Right eval Left eval Right 4/7 Left 4/7  Shoulder flexion                 Hip abduction 21.1 25 29.3 28.9  Shoulder adduction        Shoulder extension        Shoulder internal rotation        Shoulder external rotation        Elbow flexion 5/5 5/5    Elbow extension 4/5 seated 4/5 seated 4+/5 seated 4-/5 seated  Wrist flexion 4+ to 5/5  4/5    Wrist extension Weakness in bilat  though worse on R      Knee flexion 5/5 seated 5/5 seated    Knee extension 5/5 5/5    DF 5/5 5/5    PF WFL seated WFL seated     (Blank rows = not tested)   Active ROM A/PROM (deg) eval 08/17/23 4/7  Flexion 13 62   Extension 32 25   Right lateral flexion       Left lateral flexion       Right rotation 22 26 31   Left rotation 20 30 26    (Blank rows = not tested) Pt had no pain with cervical AROM   Shoulder pulleys in flexion and scaption Shelf reaches 3x10 bilat Seated hz abd with YTB Sidestepping x 3 laps   UEFI:  62/80    4/3  Therapeutic Activity: Shelf reach in standing 2 x 10 bilat Sidestepping x 3 laps without UE support Marching on Airex x 15 with UE support  Ther  Ex: Pulleys in flexion and scaption Rows with GTB 2x10 Standing shoulder extension with RTB 2x15 Standing elbow extension with RTB 2x15 Seated Hz abd with YTB 2x10 Standing on airex with FA 2x30 sec and NBOS 2x30 sec  Reviewed and updated HEP.  Pt requested a handout of all exercises.  PT printed out a HEP and gave pt a handout. PT educated pt in appropriate frequency of home exercises.   4/1 Manual:  STM and to suboccipitals and paraspinals in supine. STM and TPR to bilat UT seated  Therapeutic Activity: Shelf reach in standing 2 x 10 bilat Sidestepping x 3 laps without UE support  Ther Ex: Pulleys in flexion and scaption Rows with GTB 2x10 Standing shoulder extension with RTB 2x10 Seated Hz abd with YTB 2x10      PATIENT EDUCATION:  Education details:  PT answered questions.  relevant anatomy, POC, rationale of interventions, exercise form and HEP.  PT encouraged pt to be compliant with HEP. Person educated: Patient Education method: Explanation, demonstration, verbal and tactile cues Education comprehension: verbalized understanding, returned demonstration, verbal and tactile cues required  HOME EXERCISE PROGRAM: Access Code: [email or web address] URL: [email or web address] Date: 08/04/2023 Prepared by: Marnie Siren    ASSESSMENT:  CLINICAL IMPRESSION: 4/30 Pt warmed up on the rope pulleys with no increase in symptoms. Therapeutic exercises were performed focusing on general strengthening and ROM. Neuro exercises were targeted at body positioning and muscle activation against the wall. Wall clocks were done with 2lbs today which pt felt challenged him more. Rope pulldowns in gym he reported were better than the bands. Next visit can progress pt to gym activities. Pt is continuing to improve on strength and ROM. Pt will continue to benefit from skilled physical therapy to increase functional strength and ROM for functional ADL's.     OBJECTIVE IMPAIRMENTS:  Abnormal gait, decreased activity tolerance, decreased balance, decreased endurance, decreased mobility, difficulty walking, decreased ROM, decreased strength, hypomobility, impaired flexibility, impaired UE functional use, postural dysfunction, and pain.   ACTIVITY LIMITATIONS: squatting, transfers, reach over head, and locomotion level  PARTICIPATION LIMITATIONS: cleaning, shopping, and community activity  PERSONAL FACTORS: 3+ comorbidities: bilat THA, bilat TKA, CHF, DM, OA  are also affecting patient's functional outcome.   REHAB POTENTIAL: Good  CLINICAL DECISION MAKING: Stable/uncomplicated  EVALUATION COMPLEXITY: Low   GOALS:   SHORT TERM GOALS: Target date: 08/15/23   Pt will be independent and compliant with HEP for improved strength, pain, and mobility.  Baseline:  Goal status: 3/26 Pt met goal  2.  Pt will demo at least 10-15 deg in bilat shoulder flexion AROM for improved reaching.  Baseline:  Goal status: 3/26 Pt met goal  3.  Pt will report at least a 25% improvement in his gait and balance.   Baseline:  Goal status: 3/26 Pt met goal  4.  Pt will tolerate postural/scapular strengthening without adverse effects for improved stability and posture with daily and work activities and to reduce stress on cervical.  Baseline:  Goal status: 3/26 Pt met goal     LONG TERM GOALS: Target date: 10/03/2023  Pt will demo improved bilat elbow extension strength to 5/5 and bilat hip abd strength by at least 5-8 lbs for improved performance of transfers, mobility, and gait.  Baseline:  Goal status: PROGRESSING  4/29  2.  Pt will report at least a 70% improvement in his gait and balance.  Baseline:  Goal status: 70% MET  4/7  3.  Pt will demo improved posture with gait and improved step length bilat. Baseline:  Goal status: less cuing required 4/29    PLAN:  PT FREQUENCY: 2x/week  PT DURATION:  5 weeks  PLANNED INTERVENTIONS: 97164- PT Re-evaluation,  97110-Therapeutic exercises, 97530- Therapeutic activity, 97112- Neuromuscular re-education, 97535- Self Care, [id number]- Manual therapy, [phone]- Gait training, [phone]- Aquatic Therapy, Patient/Family education, Balance training, Stair training, Taping, Dry Needling, Joint mobilization, Cryotherapy, and Moist heat  PLAN FOR NEXT SESSION: Postural/scapular strengthening with theraband, elbow strengthening, balance and gait.      Herminia Lope Jshaun Abernathy SPT 09/21/23 5:16 PM  I have reviewed and concur with this student's documentation.   Kitty Perkins, PT 09/21/2023 5:17 PM   During this treatment session, the therapist was present, participating in and directing the treatment.

## 2023-09-23 ENCOUNTER — Other Ambulatory Visit: Payer: Self-pay

## 2023-09-23 ENCOUNTER — Other Ambulatory Visit (HOSPITAL_COMMUNITY): Payer: Self-pay

## 2023-09-27 ENCOUNTER — Ambulatory Visit (HOSPITAL_BASED_OUTPATIENT_CLINIC_OR_DEPARTMENT_OTHER): Attending: Internal Medicine | Admitting: Physical Therapy

## 2023-09-27 ENCOUNTER — Encounter (HOSPITAL_BASED_OUTPATIENT_CLINIC_OR_DEPARTMENT_OTHER): Payer: Self-pay | Admitting: Physical Therapy

## 2023-09-27 DIAGNOSIS — R2689 Other abnormalities of gait and mobility: Secondary | ICD-10-CM | POA: Diagnosis not present

## 2023-09-27 DIAGNOSIS — M6281 Muscle weakness (generalized): Secondary | ICD-10-CM | POA: Insufficient documentation

## 2023-09-27 DIAGNOSIS — M25612 Stiffness of left shoulder, not elsewhere classified: Secondary | ICD-10-CM | POA: Diagnosis not present

## 2023-09-27 DIAGNOSIS — M25611 Stiffness of right shoulder, not elsewhere classified: Secondary | ICD-10-CM | POA: Diagnosis not present

## 2023-09-27 DIAGNOSIS — M542 Cervicalgia: Secondary | ICD-10-CM | POA: Diagnosis not present

## 2023-09-27 IMAGING — RF DG CERVICAL SPINE 2 OR 3 VIEWS
1 series · 3 of 3 positions shown · non-contrast
Comparison: MRI cervical spine 09/18/2021

CLINICAL DATA: ACDF C3-C6

EXAM:
CERVICAL SPINE - 2-3 VIEW

[Series 1: run · 3 of 3 slices shown]
[im 1/3]
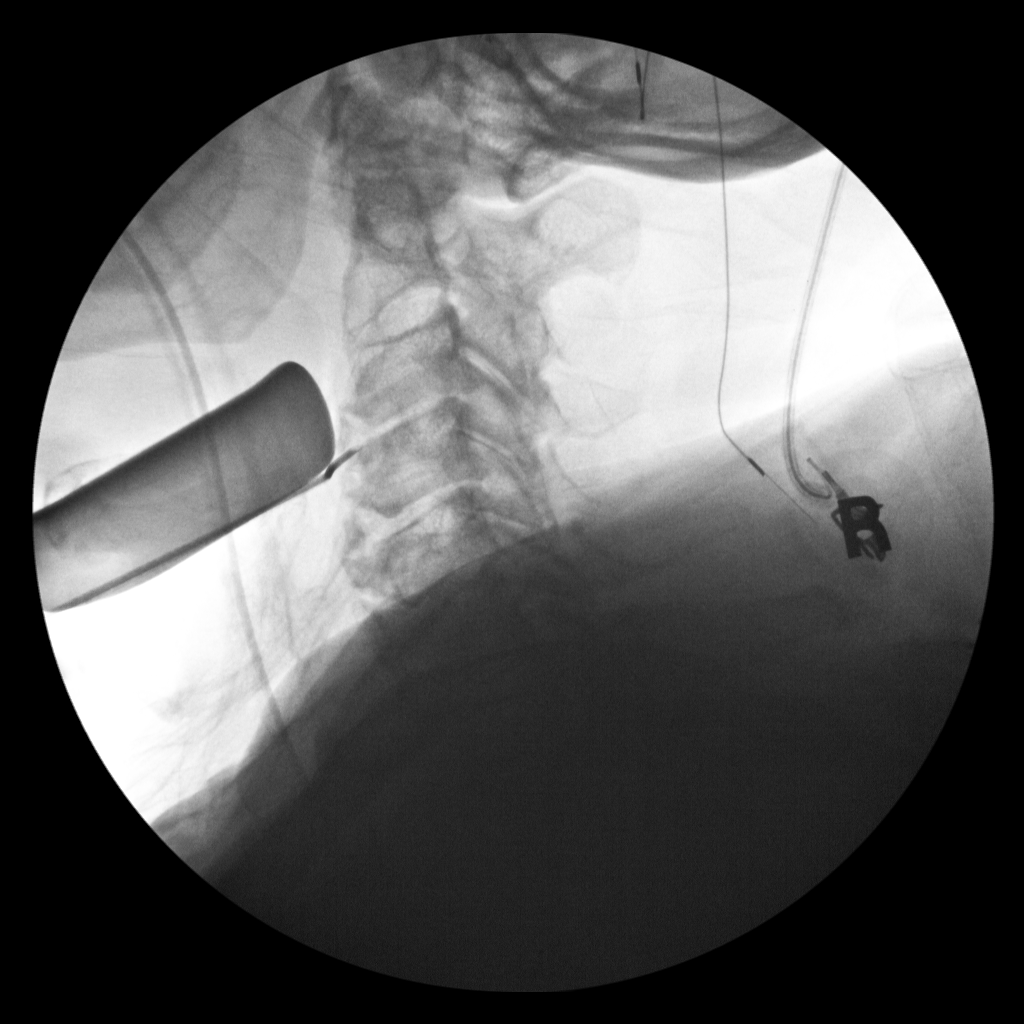
[im 2/3]
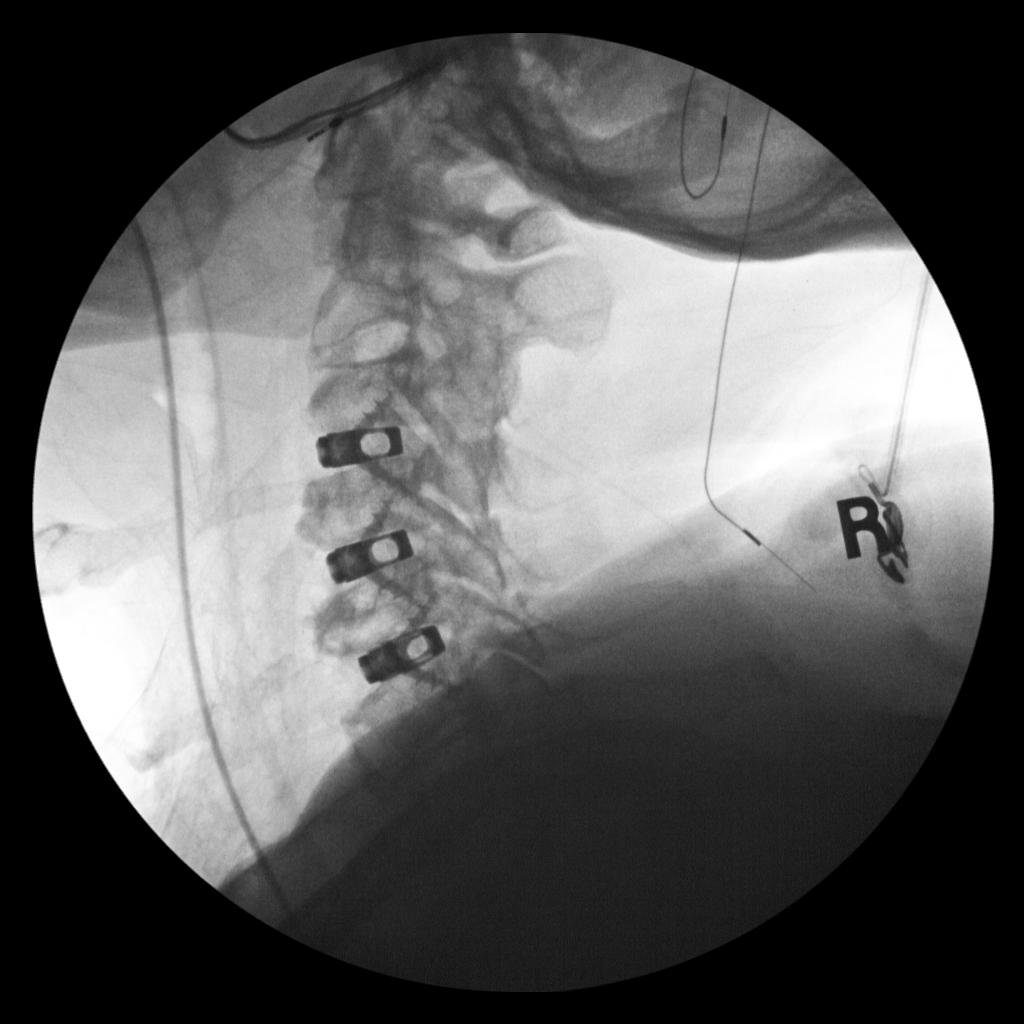
[im 3/3]
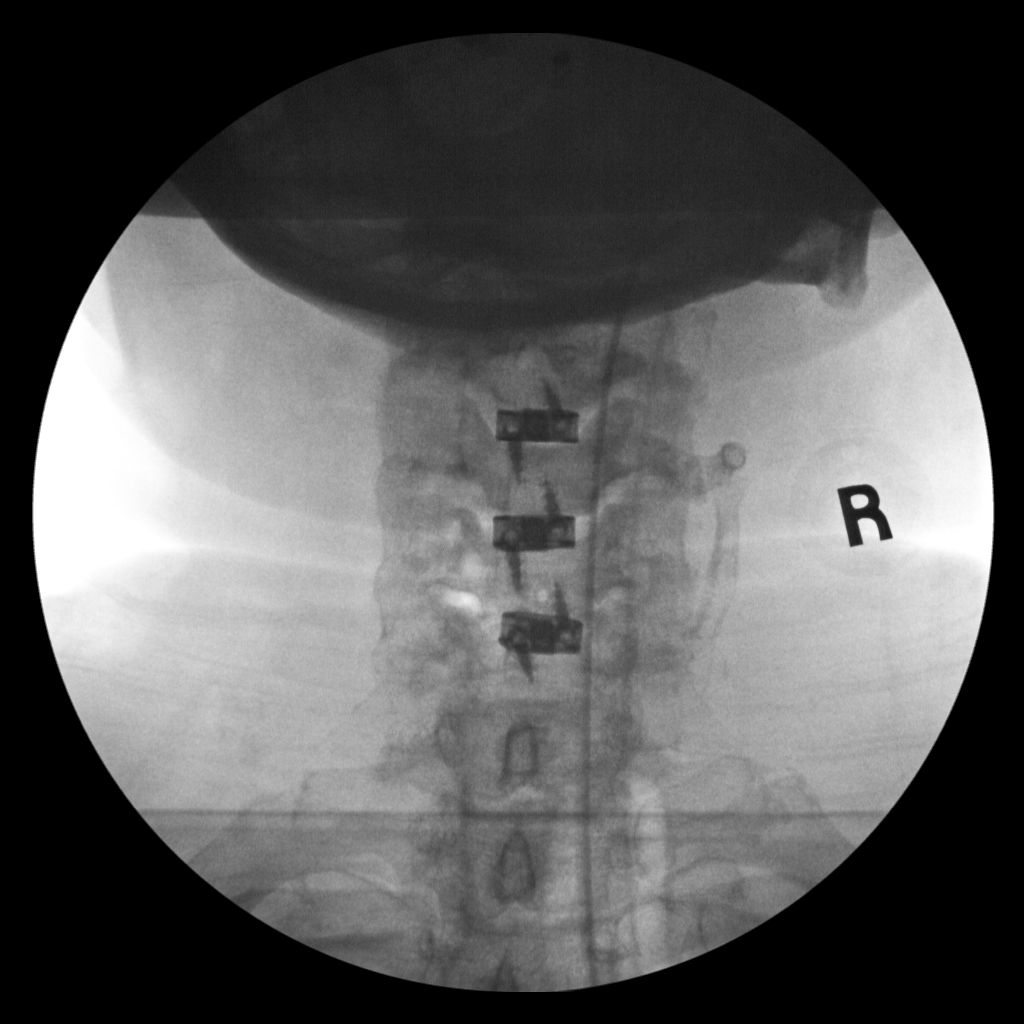

[3 of 3 positions shown; findings below may reference images not displayed]

FINDINGS: Intraoperative images during cervical spine ACDF from C3-C6.
IMPRESSION: Intraoperative images during ACDF from C3-C6.

## 2023-09-27 NOTE — Therapy (Signed)
 OUTPATIENT PHYSICAL THERAPY CERVICAL TREATMENT       Patient Name: Ryan VLASIC, MD MRN: [id number] DOB:May 20, 1951, 73 y.o., male Today's Date: 09/28/2023  END OF SESSION:  PT End of Session - 09/27/23 0808     Visit Number 17    Number of Visits 20    Date for PT Re-Evaluation 10/03/23    Authorization Type MCR A and B    PT Start Time 0806    PT Stop Time 0845    PT Time Calculation (min) 39 min    Activity Tolerance Patient tolerated treatment well;No increased pain    Behavior During Therapy [facility] for tasks assessed/performed                     Past Medical History:  Diagnosis Date   Aneurysm of ascending aorta (HCC) 06/06/2023   Chronic combined systolic and diastolic CHF (congestive heart failure) (HCC)    Coronary atherosclerosis of native coronary artery    Dental crowns present    Essential hypertension, benign    states under control with meds., has been on med. x 15 yr.   GERD (gastroesophageal reflux disease)    Hepatitis    Drug induced hepatitis   MI (myocardial infarction) (HCC) 10/27/2002   Non-insulin  dependent type 2 diabetes mellitus (HCC)    Obesity    Osteoarthritis    hips and knees   Pure hypercholesterolemia    Sleep apnea    Past Surgical History:  Procedure Laterality Date   ANKLE SURGERY Left    as a child   ANTERIOR CERVICAL DECOMP/DISCECTOMY FUSION Left 11/09/2021   Procedure: ANTERIOR CERVICAL DECOMPRESSION/DISCECTOMY FUSION 3 LEVELS C3-C6;  Surgeon: Mort Ards, MD;  Location: [facility];  Service: Orthopedics;  Laterality: Left;  4 hrs 3 C-Bed   ANTERIOR INTEROSSEOUS NERVE DECOMPRESSION Right 04/15/2021   Procedure: ANTERIOR INTEROSSEOUS NERVE DECOMPRESSION;  Surgeon: Florida Hurter, MD;  Location: [facility];  Service: Orthopedics;  Laterality: Right;  Only need 1 hour for case,  Axillary block/Mac   CARDIAC CATHETERIZATION  10/27/2002   CARPAL TUNNEL RELEASE Right 11/21/2013   CARPAL TUNNEL RELEASE  Right 04/15/2021   Procedure: CARPAL TUNNEL RELEASE;  Surgeon: Florida Hurter, MD;  Location: [facility];  Service: Orthopedics;  Laterality: Right;   COLONOSCOPY WITH PROPOFOL  N/A 04/29/2023   Procedure: COLONOSCOPY WITH PROPOFOL ;  Surgeon: Alvis Jourdain, MD;  Location: [facility];  Service: Gastroenterology;  Laterality: N/A;   CORONARY STENT PLACEMENT  10/27/2002   mid LAD   TOTAL HIP ARTHROPLASTY Left 04/09/1999   TOTAL HIP ARTHROPLASTY Right    TOTAL KNEE ARTHROPLASTY Left 02/03/2011   TOTAL KNEE ARTHROPLASTY Right 05/11/2021   Procedure: TOTAL KNEE ARTHROPLASTY;  Surgeon: Liliane Rei, MD;  Location: [facility];  Service: Orthopedics;  Laterality: Right;   ULNAR TUNNEL RELEASE Left 06/18/2016   Procedure: LEFT CUBITAL TUNNEL RELEASE;  Surgeon: Florida Hurter, MD;  Location: [facility];  Service: Orthopedics;  Laterality: Left;   Patient Active Problem List   Diagnosis Date Noted   Aneurysm of ascending aorta (HCC) 06/06/2023   Cervical myelopathy (HCC) 11/09/2021   OA (osteoarthritis) of knee 05/11/2021   Primary osteoarthritis of right knee 05/11/2021   Colon cancer screening 10/31/2020   Gastroesophageal reflux disease 10/31/2020   Morbid obesity (HCC) 10/31/2020   History of colonic polyps 10/31/2020   Pain of left hip joint 12/13/2019   AVNRT (AV nodal re-entry tachycardia) (HCC)    Withdrawal arrhythmia (  HCC)    Cubital tunnel syndrome, left 06/29/2016   CAD (coronary artery disease), native coronary artery 05/08/2014   Hyperlipidemia 03/21/2013    Class: Chronic   Old MI (myocardial infarction) 03/21/2013    Class: Chronic   DM (diabetes mellitus), type 2, uncontrolled 03/21/2013    Class: Chronic   Essential hypertension, benign 03/21/2013    Class: Chronic   Obesity (BMI 30-39.9) 03/21/2013   Chronic combined systolic and diastolic CHF (congestive heart failure) (HCC)      REFERRING PROVIDER: Armandina Lana, MD    REFERRING DIAG: [phone] (ICD-10-CM) - Other spondylosis with myelopathy, cervical region   THERAPY DIAG:  Muscle weakness (generalized)  Cervicalgia  Other abnormalities of gait and mobility  Stiffness of right shoulder, not elsewhere classified  Stiffness of left shoulder, not elsewhere classified  Rationale for Evaluation and Treatment: Rehabilitation  ONSET DATE: DOS  11/19/2022  SUBJECTIVE:                                                                                                                                                                                                         SUBJECTIVE STATEMENT: Pt denies pain currently.  He denies any pain, soreness, or adverse effects after prior Rx.  Pt reports improved strength and endurance.  Pt states he mowed his lawn.  Pt reports he has performed some of his HEP.   Hand dominance: Right  PERTINENT HISTORY:  CHF, MI, coronary artery disease with stent placement, HTN, tachycardia, and Aneurysm of ascending aorta   DM type 2 OA  PSHx: Cervical fusion C3-6 10/2021 and C1-C3 posterior spinal fusion 11/19/2022 Bilat THA (2000 and 2002) Bilat TKA  (2012 and 2022) R wrist CTR R Forearm/Anterior interrosseous nerve decompression 03/2021 L Ulnar tunnel release L ankle surgery (child)   PAIN:  NPRS:  0/10 current and best, 5-6/10 worst  Location:  R UT, R sided cervical Type:  Numbness in bilat feet, R > L and bilat hands.  Burning in L hand. Aggravating factors:  Looking to the Right for a prolonged time including looking at the computer screen. Easing factors:  having correct posture    PRECAUTIONS: Other: cervical fusion x 2, bilat THA, bilat TKA, aneurysm of ascending aorta     WEIGHT BEARING RESTRICTIONS: none indicated  FALLS:  Has patient fallen in last 6 months? 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  He is not performing surgeries.    PLOF: Independent  Pt has been using the cane since the 1st cervical fusion surgery.  Pt had better quality of gait/ambulation prior to the 2nd cervical fusion.  PATIENT GOALS: improve strength and coordination.    OBJECTIVE:  Note: Objective measures were completed at Evaluation unless otherwise noted.  DIAGNOSTIC FINDINGS:     TREATMENT DATE:        5/6 Ther Ex: Shoulder pulleys approx 4 min 3 way towel slide on wall 5x each arm Standing jobe's flexion 3# x10, 2# 2x10 Standing cable rows 3x10 10lbs  Rope pulldowns 3x10 10lbs Triceps extension with rope 5# 3x10 Rope hammer curls 3x10 5lbs Functional reach behind head in standing in front of mirror 2x10 bilat  Neuro Re-ed Activities: Standing ball circles on wall cw, ccw 3x10 each Sidestepping x 3 laps without UE support Ambulating bwds x 2 laps without UE support with SBA Marching on airex with UE support   4/30  There-ex: Shoulder pulleys AAROM Shoulder flex 3lb thumbs up 3x10 Standing cable rows 3x10 10lbs Rope pulldowns 3x10 10lbs (likes better than the bands) Pallof walkouts 3x5 each side 5lbs Rope hammer curls 3x10 5lbs Rope ext 3x10 5lbs Neuro-Re-ed  Cross shoulder flex PNF D2 2x10  Standing wall ball circles 3x10 each arm  Wall clocks 2lb 3x12   4/28  There-ex: Rope pulleys AAROM Tricep ext green RTB 3x10 Rows green RTB 3x10 Pulldowns green RTB 3x10 Bicep curls 3lbs 3x10  Neuro-Re-ed  Mirror behind head reach 3x10 each arm  Mirror behind back reach 3x10 each arm  3 way towel slide on wall 3x each arm   4/23 There-ex: Pulleys AAROM warm up Pulldowns 3x10 green RTB RPE 5 Rows green RTB 3x10 RPE 5 3 way towel slide on wall 3x  Neuro-Re-ed  Shoulder scaption 3x10 2lbs RPE 5 with mirror  Reach behind back 3x10 both sides  with mirror  Reach behind head 3x10 both sides with mirror  Pallof press green RTB 3x10 each side   4/17 Therapeutic Exercise: Shoulder pulleys in flexion and scaption Elbow extension with RTB 2x15 Rows with GTB 2x15  Therapeutic Activities: Reaching behind head 2x10 Shelf reaches 3x10 bilat Sidestepping x 2 laps, with RTB above his knees x 10 at rail Lateral step ups 2 x 10 bilat Marching on airex approx 25 reps x 2 sets with bilat UE support Pt ambulated bwds without UE assist with SBA x 2 laps  Manual Therapy:  STM to bilat cervical paraspinals in supine with triangle bolster under knees.   4/14 Therapeutic Exercise: Shoulder pulleys in flexion and scaption Elbow extension with RTB 2x15 Rows with GTB 2x15  Therapeutic Activities: Reaching behind head 2x10 Shelf reaches 2x10 bilat Sidestepping x 3 laps Lateral step ups x 10 bilat Marching on airex approx 20-25 reps with bilat UE support Pt ambulated bwds with 1 UE assist on rail x 2 laps  Manual Therapy:  STM and TPR to bilat UT in sitting and to cervical paraspinals in supine with triangle bolster under knees.   4/10 Therapeutic Exercise: Shoulder pulleys in flexion and scaption Shelf reaches 2x10 bilat Elbow extension with RTB 2x15  Therapeutic Activities: Sidestepping x 3 laps Marching on airex x 20 reps with bilat UE support Pt ambulated bwds with 1 UE assist on rail x 2 laps  Manual Therapy:  STM and TPR to bilat  UT in sitting.      PATIENT EDUCATION:  Education details:  relevant anatomy, POC, rationale of interventions, exercise form and HEP.  PT encouraged pt to be compliant with HEP. Person educated: Patient Education method: Explanation, demonstration, verbal and tactile cues Education comprehension: verbalized understanding, returned demonstration, verbal and tactile cues required  HOME EXERCISE PROGRAM: Access Code: [email or web address] URL: [email or web address] Date:  08/04/2023 Prepared by: Marnie Siren    ASSESSMENT:  CLINICAL IMPRESSION: Pt is progressing with strength as evidenced by performance and progression of exercises.  Pt had good tolerance with strengthening/cable exercises.  Pt performed exercises well with cuing and instruction in correct form.  PT also performed neuro re-ed activities to improve proprioception, functional stability, and mobility.  Pt did not use the rail with bwd ambulation and took small steps.  He responded well to Rx having no pain and no c/o's after Rx.    OBJECTIVE IMPAIRMENTS: Abnormal gait, decreased activity tolerance, decreased balance, decreased endurance, decreased mobility, difficulty walking, decreased ROM, decreased strength, hypomobility, impaired flexibility, impaired UE functional use, postural dysfunction, and pain.   ACTIVITY LIMITATIONS: squatting, transfers, reach over head, and locomotion level  PARTICIPATION LIMITATIONS: cleaning, shopping, and community activity  PERSONAL FACTORS: 3+ comorbidities: bilat THA, bilat TKA, CHF, DM, OA  are also affecting patient's functional outcome.   REHAB POTENTIAL: Good  CLINICAL DECISION MAKING: Stable/uncomplicated  EVALUATION COMPLEXITY: Low   GOALS:   SHORT TERM GOALS: Target date: 08/15/23   Pt will be independent and compliant with HEP for improved strength, pain, and mobility.  Baseline:  Goal status: 3/26 Pt met goal  2.  Pt will demo at least 10-15 deg in bilat shoulder flexion AROM for improved reaching.  Baseline:  Goal status: 3/26 Pt met goal  3.  Pt will report at least a 25% improvement in his gait and balance.   Baseline:  Goal status: 3/26 Pt met goal  4.  Pt will tolerate postural/scapular strengthening without adverse effects for improved stability and posture with daily and work activities and to reduce stress on cervical.  Baseline:  Goal status: 3/26 Pt met goal     LONG TERM GOALS: Target date: 10/03/2023  Pt will  demo improved bilat elbow extension strength to 5/5 and bilat hip abd strength by at least 5-8 lbs for improved performance of transfers, mobility, and gait.  Baseline:  Goal status: PROGRESSING  4/29  2.  Pt will report at least a 70% improvement in his gait and balance.  Baseline:  Goal status: 70% MET  4/7  3.  Pt will demo improved posture with gait and improved step length bilat. Baseline:  Goal status: less cuing required 4/29    PLAN:  PT FREQUENCY: 2x/week  PT DURATION:  5 weeks  PLANNED INTERVENTIONS: 97164- PT Re-evaluation, 97110-Therapeutic exercises, 97530- Therapeutic activity, 97112- Neuromuscular re-education, 97535- Self Care, [id number]- Manual therapy, [phone]- Gait training, [phone]- Aquatic Therapy, Patient/Family education, Balance training, Stair training, Taping, Dry Needling, Joint mobilization, Cryotherapy, and Moist heat  PLAN FOR NEXT SESSION: Postural/scapular strengthening with theraband, elbow strengthening, balance and gait.      Trina Fujita III PT, DPT 09/28/23 10:46 AM

## 2023-09-29 ENCOUNTER — Ambulatory Visit (HOSPITAL_BASED_OUTPATIENT_CLINIC_OR_DEPARTMENT_OTHER): Admitting: Physical Therapy

## 2023-09-29 ENCOUNTER — Other Ambulatory Visit: Payer: Self-pay

## 2023-09-29 ENCOUNTER — Other Ambulatory Visit (HOSPITAL_COMMUNITY): Payer: Self-pay

## 2023-09-29 DIAGNOSIS — M25611 Stiffness of right shoulder, not elsewhere classified: Secondary | ICD-10-CM | POA: Diagnosis not present

## 2023-09-29 DIAGNOSIS — R2689 Other abnormalities of gait and mobility: Secondary | ICD-10-CM

## 2023-09-29 DIAGNOSIS — M6281 Muscle weakness (generalized): Secondary | ICD-10-CM

## 2023-09-29 DIAGNOSIS — M542 Cervicalgia: Secondary | ICD-10-CM | POA: Diagnosis not present

## 2023-09-29 DIAGNOSIS — M25612 Stiffness of left shoulder, not elsewhere classified: Secondary | ICD-10-CM

## 2023-09-29 NOTE — Therapy (Signed)
 OUTPATIENT PHYSICAL THERAPY CERVICAL TREATMENT       Patient Name: Ryan GRISSETT, MD MRN: [id number] DOB:[birth year], 73 y.o., male Today's Date: 09/30/2023  END OF SESSION:  PT End of Session - 09/29/23 0945     Visit Number 18    Number of Visits 20    Date for PT Re-Evaluation 10/03/23    Authorization Type MCR A and B    PT Start Time 0942    PT Stop Time 1023    PT Time Calculation (min) 41 min    Activity Tolerance Patient tolerated treatment well;No increased pain    Behavior During Therapy [facility] for tasks assessed/performed                     Past Medical History:  Diagnosis Date   Aneurysm of ascending aorta (HCC) 06/06/2023   Chronic combined systolic and diastolic CHF (congestive heart failure) (HCC)    Coronary atherosclerosis of native coronary artery    Dental crowns present    Essential hypertension, benign    states under control with meds., has been on med. x 15 yr.   GERD (gastroesophageal reflux disease)    Hepatitis    Drug induced hepatitis   MI (myocardial infarction) (HCC) 10/27/2002   Non-insulin  dependent type 2 diabetes mellitus (HCC)    Obesity    Osteoarthritis    hips and knees   Pure hypercholesterolemia    Sleep apnea    Past Surgical History:  Procedure Laterality Date   ANKLE SURGERY Left    as a child   ANTERIOR CERVICAL DECOMP/DISCECTOMY FUSION Left 11/09/2021   Procedure: ANTERIOR CERVICAL DECOMPRESSION/DISCECTOMY FUSION 3 LEVELS C3-C6;  Surgeon: Mort Ards, MD;  Location: [facility];  Service: Orthopedics;  Laterality: Left;  4 hrs 3 C-Bed   ANTERIOR INTEROSSEOUS NERVE DECOMPRESSION Right 04/15/2021   Procedure: ANTERIOR INTEROSSEOUS NERVE DECOMPRESSION;  Surgeon: Florida Hurter, MD;  Location: [facility];  Service: Orthopedics;  Laterality: Right;  Only need 1 hour for case,  Axillary block/Mac   CARDIAC CATHETERIZATION  10/27/2002   CARPAL TUNNEL RELEASE Right 11/21/2013   CARPAL TUNNEL RELEASE  Right 04/15/2021   Procedure: CARPAL TUNNEL RELEASE;  Surgeon: Florida Hurter, MD;  Location: [facility];  Service: Orthopedics;  Laterality: Right;   COLONOSCOPY WITH PROPOFOL  N/A 04/29/2023   Procedure: COLONOSCOPY WITH PROPOFOL ;  Surgeon: Alvis Jourdain, MD;  Location: [facility];  Service: Gastroenterology;  Laterality: N/A;   CORONARY STENT PLACEMENT  10/27/2002   mid LAD   TOTAL HIP ARTHROPLASTY Left 04/09/1999   TOTAL HIP ARTHROPLASTY Right    TOTAL KNEE ARTHROPLASTY Left 02/03/2011   TOTAL KNEE ARTHROPLASTY Right 05/11/2021   Procedure: TOTAL KNEE ARTHROPLASTY;  Surgeon: Liliane Rei, MD;  Location: [facility];  Service: Orthopedics;  Laterality: Right;   ULNAR TUNNEL RELEASE Left 06/18/2016   Procedure: LEFT CUBITAL TUNNEL RELEASE;  Surgeon: Florida Hurter, MD;  Location: [facility];  Service: Orthopedics;  Laterality: Left;   Patient Active Problem List   Diagnosis Date Noted   Aneurysm of ascending aorta (HCC) 06/06/2023   Cervical myelopathy (HCC) 11/09/2021   OA (osteoarthritis) of knee 05/11/2021   Primary osteoarthritis of right knee 05/11/2021   Colon cancer screening 10/31/2020   Gastroesophageal reflux disease 10/31/2020   Morbid obesity (HCC) 10/31/2020   History of colonic polyps 10/31/2020   Pain of left hip joint 12/13/2019   AVNRT (AV nodal re-entry tachycardia) (HCC)    Withdrawal arrhythmia (  HCC)    Cubital tunnel syndrome, left 06/29/2016   CAD (coronary artery disease), native coronary artery 05/08/2014   Hyperlipidemia 03/21/2013    Class: Chronic   Old MI (myocardial infarction) 03/21/2013    Class: Chronic   DM (diabetes mellitus), type 2, uncontrolled 03/21/2013    Class: Chronic   Essential hypertension, benign 03/21/2013    Class: Chronic   Obesity (BMI 30-39.9) 03/21/2013   Chronic combined systolic and diastolic CHF (congestive heart failure) (HCC)      REFERRING PROVIDER: Armandina Lana, MD    REFERRING DIAG: [phone] (ICD-10-CM) - Other spondylosis with myelopathy, cervical region   THERAPY DIAG:  Muscle weakness (generalized)  Cervicalgia  Other abnormalities of gait and mobility  Stiffness of right shoulder, not elsewhere classified  Stiffness of left shoulder, not elsewhere classified  Rationale for Evaluation and Treatment: Rehabilitation  ONSET DATE: DOS  11/19/2022  SUBJECTIVE:                                                                                                                                                                                                         SUBJECTIVE STATEMENT: Pt denies pain currently.  He denies any pain, soreness, or adverse effects after prior Rx.  Pt reports improved strength and endurance.  Pt states he mowed his lawn and was tired afterwards.  Pt reports he has performed some of his HEP.   Hand dominance: Right  PERTINENT HISTORY:  CHF, MI, coronary artery disease with stent placement, HTN, tachycardia, and Aneurysm of ascending aorta   DM type 2 OA  PSHx: Cervical fusion C3-6 10/2021 and C1-C3 posterior spinal fusion 11/19/2022 Bilat THA (2000 and 2002) Bilat TKA  (2012 and 2022) R wrist CTR R Forearm/Anterior interrosseous nerve decompression 03/2021 L Ulnar tunnel release L ankle surgery (child)   PAIN:  NPRS:  0/10 current and best, 5-6/10 worst  Location:  R UT, R sided cervical Type:  Numbness in bilat feet, R > L and bilat hands.  Burning in L hand. Aggravating factors:  Looking to the Right for a prolonged time including looking at the computer screen. Easing factors:  having correct posture    PRECAUTIONS: Other: cervical fusion x 2, bilat THA, bilat TKA, aneurysm of ascending aorta     WEIGHT BEARING RESTRICTIONS: none indicated  FALLS:  Has patient fallen in last 6 months? 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  He is not performing surgeries.    PLOF: Independent  Pt has been using the cane since the 1st cervical fusion surgery.  Pt had better quality of gait/ambulation prior to the 2nd cervical fusion.  PATIENT GOALS: improve strength and coordination.    OBJECTIVE:  Note: Objective measures were completed at Evaluation unless otherwise noted.  DIAGNOSTIC FINDINGS:     TREATMENT DATE:        5/8 Ther Ex: Shoulder pulleys 3 way towel slide on wall 5x each arm Standing cable rows 3x10 10lbs  Rope pulldowns 3x10 10lbs Triceps extension with rope 5# 3x10 Rope hammer curls 3x10 5lbs   Neuro Re-ed Activities: Standing ball circles on wall cw, ccw 3x10 each Sidestepping x 3 laps without UE support Ambulating bwds x 2 laps without UE support with SBA   Manual Therapy: Pt received STM to bilat lumbar paraspinals and cervical paraspinals in sitting.  5/6 Ther Ex: Shoulder pulleys approx 4 min 3 way towel slide on wall 5x each arm Standing jobe's flexion 3# x10, 2# 2x10 Standing cable rows 3x10 10lbs  Rope pulldowns 3x10 10lbs Triceps extension with rope 5# 3x10 Rope hammer curls 3x10 5lbs Functional reach behind head in standing in front of mirror 2x10 bilat  Neuro Re-ed Activities: Standing ball circles on wall cw, ccw 3x10 each Sidestepping x 3 laps without UE support Ambulating bwds x 2 laps without UE support with SBA Marching on airex with UE support   4/30  There-ex: Shoulder pulleys AAROM Shoulder flex 3lb thumbs up 3x10 Standing cable rows 3x10 10lbs Rope pulldowns 3x10 10lbs (likes better than the bands) Pallof walkouts 3x5 each side 5lbs Rope hammer curls 3x10 5lbs Rope ext 3x10 5lbs Neuro-Re-ed  Cross shoulder flex PNF D2 2x10  Standing wall ball circles 3x10 each arm  Wall clocks 2lb 3x12   4/28  There-ex: Rope pulleys AAROM Tricep ext  green RTB 3x10 Rows green RTB 3x10 Pulldowns green RTB 3x10 Bicep curls 3lbs 3x10  Neuro-Re-ed  Mirror behind head reach 3x10 each arm  Mirror behind back reach 3x10 each arm  3 way towel slide on wall 3x each arm   4/23 There-ex: Pulleys AAROM warm up Pulldowns 3x10 green RTB RPE 5 Rows green RTB 3x10 RPE 5 3 way towel slide on wall 3x  Neuro-Re-ed  Shoulder scaption 3x10 2lbs RPE 5 with mirror  Reach behind back 3x10 both sides with mirror  Reach behind head 3x10 both sides with mirror  Pallof press green RTB 3x10 each side   4/17 Therapeutic Exercise: Shoulder pulleys in flexion and scaption Elbow extension with RTB 2x15 Rows with GTB 2x15  Therapeutic Activities: Reaching behind head 2x10 Shelf reaches 3x10 bilat Sidestepping x 2 laps, with RTB above his knees x 10 at rail Lateral step ups 2 x 10 bilat Marching on airex approx 25 reps x 2 sets with bilat UE support Pt ambulated bwds without UE assist with SBA x 2 laps  Manual Therapy:  STM to bilat cervical paraspinals in supine with triangle bolster under knees.     PATIENT EDUCATION:  Education details:  relevant anatomy, POC, rationale of interventions, exercise form and HEP.  PT encouraged pt to be compliant with HEP. Person educated: Patient Education method: Explanation, demonstration, verbal and tactile cues Education comprehension: verbalized understanding, returned demonstration, verbal and tactile cues required  HOME EXERCISE PROGRAM: Access Code:  [email or web address] URL: [email or web address] Date: 08/04/2023 Prepared by: Marnie Siren    ASSESSMENT:  CLINICAL IMPRESSION: Pt had good tolerance with strengthening/cable exercises.  He is improving with strength as evidenced by performance and progression of exercises.  PT performed neuro re-ed activities to improve proprioception, functional stability, and mobility.  PT instructed pt in proper form with ball  rolls on wall including to extend elbow and to keep ball at shoulder height.  Pt did not use the rail with bwd ambulation and PT cued pt to take larger steps.  He responded well to Rx having no pain and no c/o's after Rx.     OBJECTIVE IMPAIRMENTS: Abnormal gait, decreased activity tolerance, decreased balance, decreased endurance, decreased mobility, difficulty walking, decreased ROM, decreased strength, hypomobility, impaired flexibility, impaired UE functional use, postural dysfunction, and pain.   ACTIVITY LIMITATIONS: squatting, transfers, reach over head, and locomotion level  PARTICIPATION LIMITATIONS: cleaning, shopping, and community activity  PERSONAL FACTORS: 3+ comorbidities: bilat THA, bilat TKA, CHF, DM, OA are also affecting patient's functional outcome.   REHAB POTENTIAL: Good  CLINICAL DECISION MAKING: Stable/uncomplicated  EVALUATION COMPLEXITY: Low   GOALS:   SHORT TERM GOALS: Target date: 08/15/23   Pt will be independent and compliant with HEP for improved strength, pain, and mobility.  Baseline:  Goal status: 3/26 Pt met goal  2.  Pt will demo at least 10-15 deg in bilat shoulder flexion AROM for improved reaching.  Baseline:  Goal status: 3/26 Pt met goal  3.  Pt will report at least a 25% improvement in his gait and balance.   Baseline:  Goal status: 3/26 Pt met goal  4.  Pt will tolerate postural/scapular strengthening without adverse effects for improved stability and posture with daily and work activities and to reduce stress on cervical.  Baseline:  Goal status: 3/26 Pt met goal     LONG TERM GOALS: Target date: 10/03/2023  Pt will demo improved bilat elbow extension strength to 5/5 and bilat hip abd strength by at least 5-8 lbs for improved performance of transfers, mobility, and gait.  Baseline:  Goal status: PROGRESSING  4/29  2.  Pt will report at least a 70% improvement in his gait and balance.  Baseline:  Goal status: 70% MET   4/7  3.  Pt will demo improved posture with gait and improved step length bilat. Baseline:  Goal status: less cuing required 4/29    PLAN:  PT FREQUENCY: 2x/week  PT DURATION:  5 weeks  PLANNED INTERVENTIONS: 97164- PT Re-evaluation, 97110-Therapeutic exercises, 97530- Therapeutic activity, 97112- Neuromuscular re-education, 97535- Self Care, 10272- Manual therapy, [phone]- Gait training, [phone]- Aquatic Therapy, Patient/Family education, Balance training, Stair training, Taping, Dry Needling, Joint mobilization, Cryotherapy, and Moist heat  PLAN FOR NEXT SESSION: Postural/scapular strengthening with theraband, elbow strengthening, balance and gait.      Trina Fujita III PT, DPT 09/30/23 1:17 PM

## 2023-09-30 ENCOUNTER — Encounter (HOSPITAL_BASED_OUTPATIENT_CLINIC_OR_DEPARTMENT_OTHER): Payer: Self-pay | Admitting: Physical Therapy

## 2023-10-02 NOTE — Therapy (Signed)
 OUTPATIENT PHYSICAL THERAPY CERVICAL TREATMENT  DISCHARGE SUMMARY           Patient Name: Ryan DOBROWOLSKI, MD MRN: [id number] DOB:[birth year], 73 y.o., male Today's Date: 10/04/2023  END OF SESSION:  PT End of Session - 10/03/23 0857     Visit Number 19    Number of Visits 19    Authorization Type MCR A and B    PT Start Time 0806    PT Stop Time 0851    PT Time Calculation (min) 45 min    Activity Tolerance Patient tolerated treatment well    Behavior During Therapy [facility] for tasks assessed/performed                      Past Medical History:  Diagnosis Date   Aneurysm of ascending aorta (HCC) 06/06/2023   Chronic combined systolic and diastolic CHF (congestive heart failure) (HCC)    Coronary atherosclerosis of native coronary artery    Dental crowns present    Essential hypertension, benign    states under control with meds., has been on med. x 15 yr.   GERD (gastroesophageal reflux disease)    Hepatitis    Drug induced hepatitis   MI (myocardial infarction) (HCC) 10/27/2002   Non-insulin  dependent type 2 diabetes mellitus (HCC)    Obesity    Osteoarthritis    hips and knees   Pure hypercholesterolemia    Sleep apnea    Past Surgical History:  Procedure Laterality Date   ANKLE SURGERY Left    as a child   ANTERIOR CERVICAL DECOMP/DISCECTOMY FUSION Left 11/09/2021   Procedure: ANTERIOR CERVICAL DECOMPRESSION/DISCECTOMY FUSION 3 LEVELS C3-C6;  Surgeon: Mort Ards, MD;  Location: [facility];  Service: Orthopedics;  Laterality: Left;  4 hrs 3 C-Bed   ANTERIOR INTEROSSEOUS NERVE DECOMPRESSION Right 04/15/2021   Procedure: ANTERIOR INTEROSSEOUS NERVE DECOMPRESSION;  Surgeon: Florida Hurter, MD;  Location: [facility];  Service: Orthopedics;  Laterality: Right;  Only need 1 hour for case,  Axillary block/Mac   CARDIAC CATHETERIZATION  10/27/2002   CARPAL TUNNEL RELEASE Right 11/21/2013   CARPAL TUNNEL RELEASE Right 04/15/2021    Procedure: CARPAL TUNNEL RELEASE;  Surgeon: Florida Hurter, MD;  Location: [facility];  Service: Orthopedics;  Laterality: Right;   COLONOSCOPY WITH PROPOFOL  N/A 04/29/2023   Procedure: COLONOSCOPY WITH PROPOFOL ;  Surgeon: Alvis Jourdain, MD;  Location: [facility];  Service: Gastroenterology;  Laterality: N/A;   CORONARY STENT PLACEMENT  10/27/2002   mid LAD   TOTAL HIP ARTHROPLASTY Left 04/09/1999   TOTAL HIP ARTHROPLASTY Right    TOTAL KNEE ARTHROPLASTY Left 02/03/2011   TOTAL KNEE ARTHROPLASTY Right 05/11/2021   Procedure: TOTAL KNEE ARTHROPLASTY;  Surgeon: Liliane Rei, MD;  Location: [facility];  Service: Orthopedics;  Laterality: Right;   ULNAR TUNNEL RELEASE Left 06/18/2016   Procedure: LEFT CUBITAL TUNNEL RELEASE;  Surgeon: Florida Hurter, MD;  Location: [facility];  Service: Orthopedics;  Laterality: Left;   Patient Active Problem List   Diagnosis Date Noted   Aneurysm of ascending aorta (HCC) 06/06/2023   Cervical myelopathy (HCC) 11/09/2021   OA (osteoarthritis) of knee 05/11/2021   Primary osteoarthritis of right knee 05/11/2021   Colon cancer screening 10/31/2020   Gastroesophageal reflux disease 10/31/2020   Morbid obesity (HCC) 10/31/2020   History of colonic polyps 10/31/2020   Pain of left hip joint 12/13/2019   AVNRT (AV nodal re-entry tachycardia) (HCC)    Withdrawal arrhythmia (HCC)  Cubital tunnel syndrome, left 06/29/2016   CAD (coronary artery disease), native coronary artery 05/08/2014   Hyperlipidemia 03/21/2013    Class: Chronic   Old MI (myocardial infarction) 03/21/2013    Class: Chronic   DM (diabetes mellitus), type 2, uncontrolled 03/21/2013    Class: Chronic   Essential hypertension, benign 03/21/2013    Class: Chronic   Obesity (BMI 30-39.9) 03/21/2013   Chronic combined systolic and diastolic CHF (congestive heart failure) (HCC)      REFERRING PROVIDER: Armandina Lana, MD   REFERRING DIAG: [id number]  (ICD-10-CM) - Other spondylosis with myelopathy, cervical region   THERAPY DIAG:  Muscle weakness (generalized)  Cervicalgia  Other abnormalities of gait and mobility  Stiffness of right shoulder, not elsewhere classified  Stiffness of left shoulder, not elsewhere classified  Rationale for Evaluation and Treatment: Rehabilitation  ONSET DATE: DOS  11/19/2022  SUBJECTIVE:                                                                                                                                                                                                         SUBJECTIVE STATEMENT: Pt denies pain currently.  He denies any pain, soreness, or adverse effects after prior Rx.  Pt reports he has performed some of his HEP.  Functional Improvements:  Pt is able to cut his grass.  Strength, balance/coordination, and walking.  Pt is able to reach higher.  Pt reports a 70% improvement in reaching behind head.   Pt reports 50% improvement in his gait and balance (no change since PN)   Functional Deficits:  Pt reports though balance and strength have improved, he still has some balance issues and strength deficits.    Hand dominance: Right  PERTINENT HISTORY:  CHF, MI, coronary artery disease with stent placement, HTN, tachycardia, and Aneurysm of ascending aorta   DM type 2 OA  PSHx: Cervical fusion C3-6 10/2021 and C1-C3 posterior spinal fusion 11/19/2022 Bilat THA (2000 and 2002) Bilat TKA  (2012 and 2022) R wrist CTR R Forearm/Anterior interrosseous nerve decompression 03/2021 L Ulnar tunnel release L ankle surgery (child)   PAIN:  NPRS:  0/10 current and best, 5-6/10 worst  Location:  R UT, R sided cervical Type:  Numbness in bilat feet, R > L and bilat hands.  Burning in L hand. Aggravating factors:  Looking to the Right for a prolonged time including looking at the computer screen. Easing factors:  having correct posture    PRECAUTIONS: Other: cervical fusion x 2,  bilat THA, bilat TKA, aneurysm of ascending aorta  WEIGHT BEARING RESTRICTIONS: none indicated  FALLS:  Has patient fallen in last 6 months? 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    PLOF: Independent  Pt has been using the cane since the 1st cervical fusion surgery.  Pt had better quality of gait/ambulation prior to the 2nd cervical fusion.  PATIENT GOALS: improve strength and coordination.    OBJECTIVE:  Note: Objective measures were completed at Evaluation unless otherwise noted.  DIAGNOSTIC FINDINGS:     TREATMENT DATE:        5/12  Shoulder Flexion AROM: Initial/Current: R: 102/148, L: 113/147 deg      MMT and HHD Right eval Left eval Right 4/7 Left 4/7 Right 5/12 Left 5/12  Shoulder flexion                         Hip abduction 21.1 25 29.3 28.9 32.0 31.0  Shoulder adduction            Shoulder extension            Shoulder internal rotation            Shoulder external rotation            Elbow flexion 5/5 5/5        Elbow extension 4/5 seated 4/5 seated 4+/5 seated 4-/5 seated 4+/5 seated 4/5 seated  Wrist flexion 4+ to 5/5  4/5        Wrist extension Weakness in bilat though worse on R          Knee flexion 5/5 seated 5/5 seated        Knee extension 5/5 5/5        DF 5/5 5/5        PF WFL seated WFL seated         (Blank rows = not tested)     Active ROM A/PROM (deg) eval 08/17/23 4/7 5/12  Flexion 13 62     Extension 32 25     Right lateral flexion         Left lateral flexion         Right rotation 22 26 31  32  Left rotation 20 30 26 23    (Blank rows = not tested) Pt had no pain with cervical AROM  Gait:  Pt ambulated with and without cane.  He has improved step length bilat.  He has minimally improved posture with gait and continues to have cervical in  flexion with gait.    UEFI:  Prior/Current: 62/80 /  58/80  Reviewed HEP and updated HEP.  PT gave pt a HEP handout and was educated in correct form and appropriate frequency.    Ther Ex: Standing cable rows x10 10lbs, 2x10 12.5 lbs  Rope pulldowns 3x10 10lbs Sidestepping at rail x 3 laps without UE support      PATIENT EDUCATION:  Education details:  relevant anatomy, POC/discharge planning, objective findings, goal progress and progress overall, rationale of interventions, exercise form and HEP.  PT encouraged pt to be compliant with HEP. Person educated: Patient Education method: Explanation, demonstration, verbal and tactile cues Education comprehension: verbalized understanding, returned demonstration, verbal and tactile cues required  HOME EXERCISE PROGRAM: Access Code: [email or web address] URL: [email or web address] Date: 08/04/2023 Prepared  by: Marnie Siren  Updated HEP:  Side Stepping with Counter Support  - 1 x daily - 4-5 x weekly - 2-3 reps    ASSESSMENT:  CLINICAL IMPRESSION: Pt has made good improvement in PT and reports good functional improvement.  He has improved tolerance with activity as evidenced by mowing his lawn.  Pt is able to reach higher and reports a 70% improvement in reaching behind head.  Though balance and strength have improved, he still reports having some balance issues and strength deficits.  Pt demonstrates improved gait including increased step length bilat.  Pt demonstrates improved bilat hip abduction strength and L elbow extension strength.  Pt has improved cervical rotation AROM overall though L rotation was less than prior testing.  Pt is independent with HEP though is not consistent with HEP.  He states he performs some of his HEP though needs to work more on it.  PT reviewed and updated HEP today.  Pt has met STG's #2-4 and partially met STG #1 and all LTG's.  Pt is ready for discharge.    OBJECTIVE IMPAIRMENTS: Abnormal gait,  decreased activity tolerance, decreased balance, decreased endurance, decreased mobility, difficulty walking, decreased ROM, decreased strength, hypomobility, impaired flexibility, impaired UE functional use, postural dysfunction, and pain.   ACTIVITY LIMITATIONS: squatting, transfers, reach over head, and locomotion level  PARTICIPATION LIMITATIONS: cleaning, shopping, and community activity  PERSONAL FACTORS: 3+ comorbidities: bilat THA, bilat TKA, CHF, DM, OA are also affecting patient's functional outcome.   REHAB POTENTIAL: Good  CLINICAL DECISION MAKING: Stable/uncomplicated  EVALUATION COMPLEXITY: Low   GOALS:   SHORT TERM GOALS: Target date: 08/15/23   Pt will be independent and compliant with HEP for improved strength, pain, and mobility.  Baseline:  Goal status: PARTIALLY MET  5/12  2.  Pt will demo at least 10-15 deg in bilat shoulder flexion AROM for improved reaching.  Baseline:  Goal status: 3/26 Pt met goal  3.  Pt will report at least a 25% improvement in his gait and balance.   Baseline:  Goal status: 3/26 Pt met goal  4.  Pt will tolerate postural/scapular strengthening without adverse effects for improved stability and posture with daily and work activities and to reduce stress on cervical.  Baseline:  Goal status: 3/26 Pt met goal     LONG TERM GOALS: Target date: 10/03/2023  Pt will demo improved bilat elbow extension strength to 5/5 and bilat hip abd strength by at least 5-8 lbs for improved performance of transfers, mobility, and gait.  Baseline:  Goal status: 50% MET  5/12  2.  Pt will report at least a 70% improvement in his gait and balance.  Baseline:  Goal status: 70% MET  5/12  3.  Pt will demo improved posture with gait and improved step length bilat. Baseline:  Goal status: PARTIALLY MET   5/12    PLAN:  PT FREQUENCY: 2x/week  PT DURATION:  5 weeks  PLANNED INTERVENTIONS: 97164- PT Re-evaluation, 97110-Therapeutic exercises,  97530- Therapeutic activity, 97112- Neuromuscular re-education, 97535- Self Care, 16109- Manual therapy, [phone]- Gait training, [phone]- Aquatic Therapy, Patient/Family education, Balance training, Stair training, Taping, Dry Needling, Joint mobilization, Cryotherapy, and Moist heat  PLAN FOR NEXT SESSION: Pt to be discharged from skilled PT due to good functional progress, good progress toward goals, and being pleased with progress.  He will cont with HEP.  Pt is agreeable with discharge.   PHYSICAL THERAPY DISCHARGE SUMMARY  Visits from Start of Care: 19  Current  functional level related to goals / functional outcomes: See above   Remaining deficits: See above    Education / Equipment: See above     Trina Fujita III PT, DPT 10/04/23 10:33 AM

## 2023-10-03 ENCOUNTER — Ambulatory Visit (HOSPITAL_BASED_OUTPATIENT_CLINIC_OR_DEPARTMENT_OTHER): Admitting: Physical Therapy

## 2023-10-03 ENCOUNTER — Encounter (HOSPITAL_BASED_OUTPATIENT_CLINIC_OR_DEPARTMENT_OTHER): Payer: Self-pay | Admitting: Physical Therapy

## 2023-10-03 DIAGNOSIS — M25612 Stiffness of left shoulder, not elsewhere classified: Secondary | ICD-10-CM | POA: Diagnosis not present

## 2023-10-03 DIAGNOSIS — M6281 Muscle weakness (generalized): Secondary | ICD-10-CM

## 2023-10-03 DIAGNOSIS — M542 Cervicalgia: Secondary | ICD-10-CM | POA: Diagnosis not present

## 2023-10-03 DIAGNOSIS — R2689 Other abnormalities of gait and mobility: Secondary | ICD-10-CM | POA: Diagnosis not present

## 2023-10-03 DIAGNOSIS — M25611 Stiffness of right shoulder, not elsewhere classified: Secondary | ICD-10-CM | POA: Diagnosis not present

## 2023-10-04 ENCOUNTER — Other Ambulatory Visit (HOSPITAL_COMMUNITY): Payer: Self-pay

## 2023-10-04 ENCOUNTER — Other Ambulatory Visit: Payer: Self-pay

## 2023-10-04 MED ORDER — GABAPENTIN 300 MG PO CAPS
300.0000 mg | ORAL_CAPSULE | Freq: Three times a day (TID) | ORAL | 2 refills | Status: AC
  Filled 2023-10-04: qty 90, 30d supply, fill #0
  Filled 2023-12-08: qty 90, 30d supply, fill #1
  Filled 2023-12-20 – 2024-01-14 (×2): qty 90, 30d supply, fill #2

## 2023-10-04 MED ORDER — CELECOXIB 200 MG PO CAPS
200.0000 mg | ORAL_CAPSULE | Freq: Every day | ORAL | 3 refills | Status: AC
Start: 2023-10-04 — End: ?
  Filled 2023-10-04: qty 30, 30d supply, fill #0
  Filled 2023-12-08: qty 30, 30d supply, fill #1
  Filled 2023-12-20 – 2024-01-14 (×2): qty 30, 30d supply, fill #2
  Filled 2024-02-24: qty 30, 30d supply, fill #3

## 2023-10-12 ENCOUNTER — Ambulatory Visit: Admitting: Podiatry

## 2023-10-20 DIAGNOSIS — H938X3 Other specified disorders of ear, bilateral: Secondary | ICD-10-CM | POA: Diagnosis not present

## 2023-10-21 DIAGNOSIS — H903 Sensorineural hearing loss, bilateral: Secondary | ICD-10-CM | POA: Diagnosis not present

## 2023-10-25 ENCOUNTER — Encounter: Payer: Self-pay | Admitting: Podiatry

## 2023-10-25 ENCOUNTER — Ambulatory Visit (INDEPENDENT_AMBULATORY_CARE_PROVIDER_SITE_OTHER): Admitting: Podiatry

## 2023-10-25 DIAGNOSIS — E1149 Type 2 diabetes mellitus with other diabetic neurological complication: Secondary | ICD-10-CM

## 2023-10-25 DIAGNOSIS — B351 Tinea unguium: Secondary | ICD-10-CM

## 2023-10-25 DIAGNOSIS — M79674 Pain in right toe(s): Secondary | ICD-10-CM

## 2023-10-25 DIAGNOSIS — E1140 Type 2 diabetes mellitus with diabetic neuropathy, unspecified: Secondary | ICD-10-CM

## 2023-10-25 DIAGNOSIS — M2042 Other hammer toe(s) (acquired), left foot: Secondary | ICD-10-CM | POA: Diagnosis not present

## 2023-10-25 DIAGNOSIS — M2041 Other hammer toe(s) (acquired), right foot: Secondary | ICD-10-CM

## 2023-10-25 DIAGNOSIS — I739 Peripheral vascular disease, unspecified: Secondary | ICD-10-CM | POA: Diagnosis not present

## 2023-10-25 DIAGNOSIS — E119 Type 2 diabetes mellitus without complications: Secondary | ICD-10-CM | POA: Diagnosis not present

## 2023-10-25 DIAGNOSIS — M79675 Pain in left toe(s): Secondary | ICD-10-CM | POA: Diagnosis not present

## 2023-10-30 NOTE — Progress Notes (Signed)
 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  Chief Complaint  Patient presents with   Diabetes    "Trim my nails."  Dr. Gay Katayama - 06/23/2023: A1c - 6.9   Patient confirms h/o diabetes.  Patient denies any h/o foot wounds.  Patient has been diagnosed with neuropathy.  Jeannine Milroy., MD is patient's PCP.  Past Medical History:  Diagnosis Date   Aneurysm of ascending aorta (HCC) 06/06/2023   Chronic combined systolic and diastolic CHF (congestive heart failure) (HCC)    Coronary atherosclerosis of native coronary artery    Dental crowns present    Essential hypertension, benign    states under control with meds., has been on med. x 15 yr.   GERD (gastroesophageal reflux disease)    Hepatitis    Drug induced hepatitis   MI (myocardial infarction) (HCC) 10/27/2002   Non-insulin  dependent type 2 diabetes mellitus (HCC)    Obesity    Osteoarthritis    hips and knees   Pure hypercholesterolemia    Sleep apnea    Patient Active Problem List   Diagnosis Date Noted   Aneurysm of ascending aorta (HCC) 06/06/2023   Cervical myelopathy (HCC) 11/09/2021   OA (osteoarthritis) of knee 05/11/2021   Primary osteoarthritis of right knee 05/11/2021   Colon cancer screening 10/31/2020   Gastroesophageal reflux disease 10/31/2020   Morbid obesity (HCC) 10/31/2020   History of colonic polyps 10/31/2020   Pain of left hip joint 12/13/2019   AVNRT (AV nodal re-entry tachycardia) (HCC)    Withdrawal arrhythmia (HCC)    Cubital tunnel syndrome, left 06/29/2016   CAD (coronary artery disease), native coronary artery 05/08/2014   Hyperlipidemia 03/21/2013    Class: Chronic   Old MI (myocardial infarction) 03/21/2013    Class: Chronic   DM (diabetes mellitus), type 2, uncontrolled 03/21/2013    Class: Chronic   Essential hypertension, benign 03/21/2013    Class: Chronic   Obesity (BMI 30-39.9) 03/21/2013    Chronic combined systolic and diastolic CHF (congestive heart failure) (HCC)    Past Surgical History:  Procedure Laterality Date   ANKLE SURGERY Left    as a child   ANTERIOR CERVICAL DECOMP/DISCECTOMY FUSION Left 11/09/2021   Procedure: ANTERIOR CERVICAL DECOMPRESSION/DISCECTOMY FUSION 3 LEVELS C3-C6;  Surgeon: Mort Ards, MD;  Location: [facility];  Service: Orthopedics;  Laterality: Left;  4 hrs 3 C-Bed   ANTERIOR INTEROSSEOUS NERVE DECOMPRESSION Right 04/15/2021   Procedure: ANTERIOR INTEROSSEOUS NERVE DECOMPRESSION;  Surgeon: Florida Hurter, MD;  Location: [facility];  Service: Orthopedics;  Laterality: Right;  Only need 1 hour for case,  Axillary block/Mac   CARDIAC CATHETERIZATION  10/27/2002   CARPAL TUNNEL RELEASE Right 11/21/2013   CARPAL TUNNEL RELEASE Right 04/15/2021   Procedure: CARPAL TUNNEL RELEASE;  Surgeon: Florida Hurter, MD;  Location: [facility];  Service: Orthopedics;  Laterality: Right;   COLONOSCOPY WITH PROPOFOL  N/A 04/29/2023   Procedure: COLONOSCOPY WITH PROPOFOL ;  Surgeon: Alvis Jourdain, MD;  Location: [facility];  Service: Gastroenterology;  Laterality: N/A;   CORONARY STENT PLACEMENT  10/27/2002   mid LAD   TOTAL HIP ARTHROPLASTY Left 04/09/1999   TOTAL HIP ARTHROPLASTY Right    TOTAL KNEE ARTHROPLASTY Left 02/03/2011   TOTAL KNEE ARTHROPLASTY Right 05/11/2021   Procedure: TOTAL KNEE ARTHROPLASTY;  Surgeon: Liliane Rei, MD;  Location: [facility];  Service: Orthopedics;  Laterality: Right;   ULNAR TUNNEL RELEASE Left 06/18/2016  Procedure: LEFT CUBITAL TUNNEL RELEASE;  Surgeon: Florida Hurter, MD;  Location: [facility];  Service: Orthopedics;  Laterality: Left;   Current Outpatient Medications on File Prior to Visit  Medication Sig Dispense Refill   atorvastatin  (LIPITOR) 40 MG tablet Take 1 tablet (40 mg total) by mouth at bedtime. 90 tablet 3   calcium  carbonate (TUMS - DOSED IN MG ELEMENTAL CALCIUM ) 500  MG chewable tablet Chew 1,000 mg by mouth daily as needed for indigestion or heartburn.     celecoxib  (CELEBREX ) 200 MG capsule Take 1 capsule (200 mg total) by mouth 2 (two) times daily. 30 capsule 0   celecoxib  (CELEBREX ) 200 MG capsule Take 1 capsule (200 mg total) by mouth daily. 30 capsule 3   clopidogrel  (PLAVIX ) 75 MG tablet Take 1 tablet (75 mg total) by mouth in the morning. 90 tablet 4   dapagliflozin  propanediol (FARXIGA ) 10 MG TABS tablet Take 1 tablet (10 mg total) by mouth daily. 90 tablet 4   diclofenac  Sodium (VOLTAREN  ARTHRITIS PAIN) 1 % GEL Apply 4 grams topically 3 (three) times daily to bilateral wrists and ankles 100 g 0   diltiazem  (CARDIZEM  CD) 120 MG 24 hr capsule Take 1 capsule (120 mg total) by mouth daily. 90 capsule 3   furosemide  (LASIX ) 40 MG tablet Take 1 tablet (40 mg total) by mouth daily. 30 tablet 11   gabapentin  (NEURONTIN ) 300 MG capsule Take 1 capsule (300 mg total) by mouth 3 (three) times daily. 90 capsule 2   MELATONIN PO Take 15 mg by mouth at bedtime as needed (sleep).     metFORMIN  (GLUCOPHAGE -XR) 500 MG 24 hr tablet Take 2 tablets (1,000 mg total) by mouth 2 (two) times daily. 360 tablet 3   metoprolol  (TOPROL -XL) 200 MG 24 hr tablet Take 1 tablet (200 mg total) by mouth daily. 90 tablet 3   potassium chloride  SA (KLOR-CON  M) 20 MEQ tablet Take 1 tablet (20 mEq total) by mouth daily with a meal. 90 tablet 3   potassium chloride  SA (KLOR-CON  M20) 20 MEQ tablet Take 1 tablet (20 mEq total) by mouth daily with food. 30 tablet 11   sacubitril -valsartan  (ENTRESTO ) 49-51 MG Take 1 tablet by mouth 2 (two) times daily. 180 tablet 4   tirzepatide  (MOUNJARO ) 12.5 MG/0.5ML Pen Inject 12.5 mg into the skin once a week. 2 mL 11   tirzepatide  (MOUNJARO ) 12.5 MG/0.5ML Pen Inject 12.5 mg into the skin once a week. 6 mL 4   [DISCONTINUED] amLODipine  (NORVASC ) 5 MG tablet Take 1 tablet (5 mg total) by mouth daily. Please keep scheduled appointment for future refills. Thank  you. 90 tablet 0   [DISCONTINUED] ezetimibe  (ZETIA ) 10 MG tablet Take 1 tablet (10 mg total) by mouth daily. 90 tablet 0   No current facility-administered medications on file prior to visit.    No Known Allergies Social History   Occupational History   Not on file  Tobacco Use   Smoking status: Never   Smokeless tobacco: Never  Vaping Use   Vaping status: Never Used  Substance and Sexual Activity   Alcohol  use: Not Currently    Comment: occasionally   Drug use: No   Sexual activity: Not Currently   Family History  Problem Relation Age of Onset   Healthy Mother    Healthy Father    Immunization History  Administered Date(s) Administered   Influenza, Quadrivalent, Recombinant, Inj, Pf 03/14/2023   Moderna Sars-Covid-2 Vaccination 07/03/2019, 07/24/2019   PNEUMOCOCCAL CONJUGATE-20 03/14/2023  Research officer, [employer] 52yrs & up 03/13/2021   Pneumococcal Polysaccharide-23 03/14/2023   Tdap 05/09/2013   Zoster Recombinant(Shingrix ) 02/03/2017, 04/25/2017     Review of Systems: Negative except as noted in the HPI.   Objective: There were no vitals filed for this visit.  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 male in NAD. AAO X 3.  Diabetic foot exam was performed with the following findings:   Vascular Examination: CFT less than 3 seconds. DP pulses palpable b/l. PT pulses nonpalpable b/l. Digital hair absent. +2 Edema b/l LE. Skin temperature gradient warm to warn b/l. No ischemia or gangrene. No cyanosis or clubbing noted b/l.    Neurological Examination:Protective sensation decreased with 10 gram monofilament b/l.  Dermatological Examination: Pedal skin thin, shiny and atrophic b/l. No open wounds. No interdigital macerations.   Toenails 1-5 b/l thick, discolored, elongated with subungual debris and pain on dorsal palpation.   No corns, calluses nor porokeratotic lesions noted.  Musculoskeletal Examination: Muscle strength 5/5 to all lower  extremity muscle groups bilaterally. Hammertoe deformity noted 2-5 b/l. Utilizes cane for ambulation assistance.  Radiographs: None     Lab Results  Component Value Date   HGBA1C 6.8 (H) 11/02/2021   ADA Risk Categorization: High Risk  Patient has one or more of the following: Loss of protective sensation Absent pedal pulses Severe Foot deformity History of foot ulcer  Assessment: 1. Pain due to onychomycosis of toenails of both feet   2. Acquired hammertoes of both feet   3. PAD (peripheral artery disease) (HCC)   4. Diabetic neuropathy with neurologic complication (HCC)   5. Encounter for diabetic foot exam (HCC)     Plan: Diabetic foot examination performed today. All patient's and/or POA's questions/concerns addressed on today's visit. Toenails 1-5 debrided in length and girth without incident. Continue foot and shoe inspections daily. Monitor blood glucose per PCP/Endocrinologist's recommendations. Continue soft, supportive shoe gear daily. Report any pedal injuries to medical professional. Call office if there are any questions/concerns. -Patient/POA to call should there be question/concern in the interim. Return in about 3 months (around 01/25/2024).  Luella Sager, DPM      [place] LOCATION: [address], [place] [address]                   Office [phone]   [facility] LOCATION: [address] [place], [place] [address] Office [phone]

## 2023-11-30 DIAGNOSIS — R972 Elevated prostate specific antigen [PSA]: Secondary | ICD-10-CM | POA: Diagnosis not present

## 2023-12-07 DIAGNOSIS — R972 Elevated prostate specific antigen [PSA]: Secondary | ICD-10-CM | POA: Diagnosis not present

## 2023-12-07 DIAGNOSIS — R350 Frequency of micturition: Secondary | ICD-10-CM | POA: Diagnosis not present

## 2023-12-07 DIAGNOSIS — N401 Enlarged prostate with lower urinary tract symptoms: Secondary | ICD-10-CM | POA: Diagnosis not present

## 2023-12-08 ENCOUNTER — Other Ambulatory Visit (HOSPITAL_COMMUNITY): Payer: Self-pay

## 2023-12-19 ENCOUNTER — Other Ambulatory Visit (HOSPITAL_COMMUNITY): Payer: Self-pay

## 2023-12-19 ENCOUNTER — Other Ambulatory Visit: Payer: Self-pay

## 2023-12-19 DIAGNOSIS — E66811 Obesity, class 1: Secondary | ICD-10-CM | POA: Diagnosis not present

## 2023-12-19 DIAGNOSIS — G959 Disease of spinal cord, unspecified: Secondary | ICD-10-CM | POA: Diagnosis not present

## 2023-12-19 DIAGNOSIS — Z860101 Personal history of adenomatous and serrated colon polyps: Secondary | ICD-10-CM | POA: Diagnosis not present

## 2023-12-19 DIAGNOSIS — I471 Supraventricular tachycardia, unspecified: Secondary | ICD-10-CM | POA: Diagnosis not present

## 2023-12-19 DIAGNOSIS — N529 Male erectile dysfunction, unspecified: Secondary | ICD-10-CM | POA: Diagnosis not present

## 2023-12-19 DIAGNOSIS — E785 Hyperlipidemia, unspecified: Secondary | ICD-10-CM | POA: Diagnosis not present

## 2023-12-19 DIAGNOSIS — L309 Dermatitis, unspecified: Secondary | ICD-10-CM | POA: Diagnosis not present

## 2023-12-19 DIAGNOSIS — E1169 Type 2 diabetes mellitus with other specified complication: Secondary | ICD-10-CM | POA: Diagnosis not present

## 2023-12-19 DIAGNOSIS — I5022 Chronic systolic (congestive) heart failure: Secondary | ICD-10-CM | POA: Diagnosis not present

## 2023-12-19 DIAGNOSIS — R972 Elevated prostate specific antigen [PSA]: Secondary | ICD-10-CM | POA: Diagnosis not present

## 2023-12-19 DIAGNOSIS — I2510 Atherosclerotic heart disease of native coronary artery without angina pectoris: Secondary | ICD-10-CM | POA: Diagnosis not present

## 2023-12-19 DIAGNOSIS — I110 Hypertensive heart disease with heart failure: Secondary | ICD-10-CM | POA: Diagnosis not present

## 2023-12-19 MED ORDER — CLOTRIMAZOLE-BETAMETHASONE 1-0.05 % EX CREA
TOPICAL_CREAM | CUTANEOUS | 1 refills | Status: DC
  Filled 2023-12-19: qty 45, 14d supply, fill #0
  Filled 2023-12-20 – 2024-01-14 (×2): qty 45, 14d supply, fill #1

## 2023-12-19 MED ORDER — MOUNJARO 15 MG/0.5ML ~~LOC~~ SOAJ
15.0000 mg | SUBCUTANEOUS | 3 refills | Status: AC
  Filled 2023-12-19: qty 6, 84d supply, fill #0
  Filled 2023-12-20 – 2024-05-29 (×3): qty 6, 84d supply, fill #1

## 2023-12-20 ENCOUNTER — Other Ambulatory Visit: Payer: Self-pay

## 2023-12-20 ENCOUNTER — Other Ambulatory Visit (HOSPITAL_COMMUNITY): Payer: Self-pay

## 2024-01-05 ENCOUNTER — Other Ambulatory Visit (HOSPITAL_COMMUNITY): Payer: Self-pay

## 2024-01-14 ENCOUNTER — Other Ambulatory Visit (HOSPITAL_COMMUNITY): Payer: Self-pay

## 2024-01-16 ENCOUNTER — Other Ambulatory Visit: Payer: Self-pay

## 2024-01-18 ENCOUNTER — Other Ambulatory Visit (HOSPITAL_COMMUNITY): Payer: Self-pay

## 2024-01-18 MED ORDER — CELECOXIB 200 MG PO CAPS
200.0000 mg | ORAL_CAPSULE | Freq: Two times a day (BID) | ORAL | 0 refills | Status: AC
  Filled 2024-01-18 – 2024-02-13 (×3): qty 30, 15d supply, fill #0

## 2024-01-18 MED ORDER — CELECOXIB 200 MG PO CAPS
200.0000 mg | ORAL_CAPSULE | Freq: Two times a day (BID) | ORAL | 0 refills | Status: AC
  Filled 2024-01-18 – 2024-05-29 (×3): qty 30, 15d supply, fill #0

## 2024-01-19 ENCOUNTER — Other Ambulatory Visit (HOSPITAL_COMMUNITY): Payer: Self-pay

## 2024-01-24 ENCOUNTER — Other Ambulatory Visit (HOSPITAL_COMMUNITY): Payer: Self-pay

## 2024-02-01 ENCOUNTER — Ambulatory Visit (INDEPENDENT_AMBULATORY_CARE_PROVIDER_SITE_OTHER): Admitting: Podiatry

## 2024-02-01 DIAGNOSIS — Z91198 Patient's noncompliance with other medical treatment and regimen for other reason: Secondary | ICD-10-CM

## 2024-02-05 NOTE — Progress Notes (Signed)
 1. Failure to attend appointment with reason given    Appointment rescheduled by patient.

## 2024-02-07 ENCOUNTER — Other Ambulatory Visit (HOSPITAL_COMMUNITY): Payer: Self-pay

## 2024-02-07 DIAGNOSIS — Z23 Encounter for immunization: Secondary | ICD-10-CM | POA: Diagnosis not present

## 2024-02-07 MED ORDER — AREXVY 120 MCG/0.5ML IM SUSR
0.5000 mL | Freq: Once | INTRAMUSCULAR | 0 refills | Status: AC
  Filled 2024-02-07: qty 0.5, 1d supply, fill #0

## 2024-02-07 MED ORDER — COVID-19 MRNA VAC-TRIS(PFIZER) 30 MCG/0.3ML IM SUSY
0.3000 mL | PREFILLED_SYRINGE | Freq: Once | INTRAMUSCULAR | 0 refills | Status: AC
  Filled 2024-02-07 – 2024-02-17 (×2): qty 0.3, 1d supply, fill #0

## 2024-02-13 ENCOUNTER — Other Ambulatory Visit (HOSPITAL_COMMUNITY): Payer: Self-pay

## 2024-02-15 ENCOUNTER — Telehealth: Payer: Self-pay | Admitting: Podiatry

## 2024-02-15 ENCOUNTER — Ambulatory Visit (INDEPENDENT_AMBULATORY_CARE_PROVIDER_SITE_OTHER): Payer: Self-pay | Admitting: Podiatry

## 2024-02-15 DIAGNOSIS — Z91198 Patient's noncompliance with other medical treatment and regimen for other reason: Secondary | ICD-10-CM

## 2024-02-15 NOTE — Telephone Encounter (Signed)
 Dr.Pilkington called in asking if you could work him in 9/25 or 9/26 for RFC ?

## 2024-02-15 NOTE — Progress Notes (Signed)
 1. Failure to attend appointment with reason given    Patient called to reschedule appt with Dr. Magdalen.

## 2024-02-16 ENCOUNTER — Encounter: Payer: Self-pay | Admitting: Podiatry

## 2024-02-16 ENCOUNTER — Ambulatory Visit (INDEPENDENT_AMBULATORY_CARE_PROVIDER_SITE_OTHER): Admitting: Podiatry

## 2024-02-16 VITALS — Ht 64.0 in | Wt 204.0 lb

## 2024-02-16 DIAGNOSIS — B351 Tinea unguium: Secondary | ICD-10-CM

## 2024-02-16 DIAGNOSIS — M79675 Pain in left toe(s): Secondary | ICD-10-CM

## 2024-02-16 DIAGNOSIS — M79674 Pain in right toe(s): Secondary | ICD-10-CM | POA: Diagnosis not present

## 2024-02-16 NOTE — Progress Notes (Signed)
 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   MRN: [id number]   HPI Patient presents with chronic nail disease 1-5 both feet that are thick and painful and he cannot take care of with poor health   ROS      Objective:  Physical Exam  Neurovascular status intact thick yellow brittle nailbeds 1-5 both feet painful when pressed     Assessment:  Chronic mycotic nail infection 1-5 both feet     Plan:  Debridement painful nailbeds 1-5 both feet Neutra genic bleeding reappoint routine care

## 2024-02-17 ENCOUNTER — Other Ambulatory Visit (HOSPITAL_COMMUNITY): Payer: Self-pay

## 2024-02-17 ENCOUNTER — Telehealth: Payer: Self-pay | Admitting: Cardiovascular Disease

## 2024-02-17 NOTE — Telephone Encounter (Signed)
 Pt had to r/s ECHO due to work conflict; pt originally had a f/u appt for ECHO results on 10/17 but cancelled due to ECHO needing to be rescheduled. Pt would like to receive ECHO results over the phone.

## 2024-02-17 NOTE — Telephone Encounter (Signed)
 Returned a call back to the pt.   Pt advised to keep echo appt for 10/21 and we will call him with those results when they come back.   Pt aware that Dr. Raford wants to see him back for follow-up in Jan 2026, and we will reach out to him soon to arrange that appt.   Pt verbalized understanding and agrees with this plan.

## 2024-02-20 ENCOUNTER — Other Ambulatory Visit (HOSPITAL_COMMUNITY): Payer: Self-pay

## 2024-02-21 DIAGNOSIS — M4712 Other spondylosis with myelopathy, cervical region: Secondary | ICD-10-CM | POA: Diagnosis not present

## 2024-02-24 ENCOUNTER — Other Ambulatory Visit (HOSPITAL_COMMUNITY): Payer: Self-pay

## 2024-02-24 ENCOUNTER — Other Ambulatory Visit: Payer: Self-pay

## 2024-03-02 ENCOUNTER — Other Ambulatory Visit (HOSPITAL_BASED_OUTPATIENT_CLINIC_OR_DEPARTMENT_OTHER)

## 2024-03-06 ENCOUNTER — Other Ambulatory Visit (HOSPITAL_COMMUNITY): Payer: Self-pay

## 2024-03-09 ENCOUNTER — Ambulatory Visit (HOSPITAL_BASED_OUTPATIENT_CLINIC_OR_DEPARTMENT_OTHER): Admitting: Cardiovascular Disease

## 2024-03-13 ENCOUNTER — Ambulatory Visit (INDEPENDENT_AMBULATORY_CARE_PROVIDER_SITE_OTHER)

## 2024-03-13 DIAGNOSIS — I7121 Aneurysm of the ascending aorta, without rupture: Secondary | ICD-10-CM | POA: Diagnosis not present

## 2024-03-13 DIAGNOSIS — I10 Essential (primary) hypertension: Secondary | ICD-10-CM | POA: Diagnosis not present

## 2024-03-13 DIAGNOSIS — I5042 Chronic combined systolic (congestive) and diastolic (congestive) heart failure: Secondary | ICD-10-CM | POA: Diagnosis not present

## 2024-03-14 LAB — ECHOCARDIOGRAM COMPLETE: S' Lateral: 3.13 cm

## 2024-03-19 ENCOUNTER — Ambulatory Visit (HOSPITAL_BASED_OUTPATIENT_CLINIC_OR_DEPARTMENT_OTHER): Payer: Self-pay | Admitting: Family

## 2024-04-09 ENCOUNTER — Other Ambulatory Visit (HOSPITAL_COMMUNITY): Payer: Self-pay

## 2024-05-01 ENCOUNTER — Other Ambulatory Visit (HOSPITAL_COMMUNITY): Payer: Self-pay

## 2024-05-03 ENCOUNTER — Encounter (HOSPITAL_BASED_OUTPATIENT_CLINIC_OR_DEPARTMENT_OTHER): Payer: Self-pay | Admitting: Cardiovascular Disease

## 2024-05-04 DIAGNOSIS — H40013 Open angle with borderline findings, low risk, bilateral: Secondary | ICD-10-CM | POA: Diagnosis not present

## 2024-05-04 DIAGNOSIS — H25813 Combined forms of age-related cataract, bilateral: Secondary | ICD-10-CM | POA: Diagnosis not present

## 2024-05-04 DIAGNOSIS — E119 Type 2 diabetes mellitus without complications: Secondary | ICD-10-CM | POA: Diagnosis not present

## 2024-05-11 ENCOUNTER — Ambulatory Visit: Admitting: Podiatry

## 2024-05-16 ENCOUNTER — Ambulatory Visit: Admitting: Podiatry

## 2024-05-29 ENCOUNTER — Other Ambulatory Visit (HOSPITAL_COMMUNITY): Payer: Self-pay

## 2024-05-30 ENCOUNTER — Other Ambulatory Visit (HOSPITAL_COMMUNITY): Payer: Self-pay

## 2024-05-30 ENCOUNTER — Other Ambulatory Visit: Payer: Self-pay

## 2024-05-30 MED ORDER — FUROSEMIDE 40 MG PO TABS
40.0000 mg | ORAL_TABLET | Freq: Two times a day (BID) | ORAL | 3 refills | Status: AC
  Filled 2024-05-30: qty 180, 90d supply, fill #0

## 2024-05-31 ENCOUNTER — Other Ambulatory Visit (HOSPITAL_COMMUNITY): Payer: Self-pay

## 2024-05-31 ENCOUNTER — Other Ambulatory Visit: Payer: Self-pay

## 2024-05-31 ENCOUNTER — Other Ambulatory Visit (HOSPITAL_BASED_OUTPATIENT_CLINIC_OR_DEPARTMENT_OTHER): Payer: Self-pay | Admitting: Cardiovascular Disease

## 2024-05-31 MED ORDER — ATORVASTATIN CALCIUM 40 MG PO TABS
40.0000 mg | ORAL_TABLET | Freq: Every day | ORAL | 1 refills | Status: AC
  Filled 2024-05-31: qty 90, 90d supply, fill #0

## 2024-06-01 ENCOUNTER — Other Ambulatory Visit (HOSPITAL_COMMUNITY): Payer: Self-pay

## 2024-06-01 ENCOUNTER — Other Ambulatory Visit: Payer: Self-pay

## 2024-06-02 ENCOUNTER — Other Ambulatory Visit (HOSPITAL_COMMUNITY): Payer: Self-pay

## 2024-06-06 ENCOUNTER — Other Ambulatory Visit (HOSPITAL_COMMUNITY): Payer: Self-pay

## 2024-06-06 MED ORDER — CLOTRIMAZOLE-BETAMETHASONE 1-0.05 % EX CREA
TOPICAL_CREAM | Freq: Two times a day (BID) | CUTANEOUS | 3 refills | Status: AC
  Filled 2024-06-06: qty 45, 21d supply, fill #0

## 2024-06-06 MED ORDER — POTASSIUM CHLORIDE CRYS ER 20 MEQ PO TBCR
20.0000 meq | EXTENDED_RELEASE_TABLET | Freq: Every day | ORAL | 3 refills | Status: AC
  Filled 2024-06-06 – 2024-06-13 (×2): qty 90, 90d supply, fill #0

## 2024-06-06 MED ORDER — DAPAGLIFLOZIN PROPANEDIOL 10 MG PO TABS
10.0000 mg | ORAL_TABLET | Freq: Every day | ORAL | 4 refills | Status: AC
  Filled 2024-06-06: qty 90, 90d supply, fill #0

## 2024-06-07 ENCOUNTER — Encounter: Payer: Self-pay | Admitting: Podiatry

## 2024-06-07 ENCOUNTER — Ambulatory Visit: Admitting: Podiatry

## 2024-06-07 DIAGNOSIS — B351 Tinea unguium: Secondary | ICD-10-CM

## 2024-06-07 DIAGNOSIS — M79674 Pain in right toe(s): Secondary | ICD-10-CM | POA: Diagnosis not present

## 2024-06-07 DIAGNOSIS — M79675 Pain in left toe(s): Secondary | ICD-10-CM | POA: Diagnosis not present

## 2024-06-07 NOTE — Progress Notes (Signed)
 Subjective:   Patient ID: Ryan DELENA Centers, MD, male   DOB: 74 y.o.   MRN: [id number]   HPI Patient presents with elongated painful nailbeds 1-5 both feet thick and discomforting    ROS      Objective:  Physical Exam  Neurovascular status intact thick yellow brittle nailbeds 1-5 both feet painful     Assessment:  Mycotic nail infection 1-5 both feet painful     Plan:  Debridement painful nailbeds 1-5 both feet Neutra genic bleeding reappoint routine care also would like orthotics and I have recommended that he see the pedorthist in February for customized orthotic devices

## 2024-06-13 ENCOUNTER — Other Ambulatory Visit (HOSPITAL_BASED_OUTPATIENT_CLINIC_OR_DEPARTMENT_OTHER): Payer: Self-pay | Admitting: Cardiovascular Disease

## 2024-06-13 ENCOUNTER — Other Ambulatory Visit (HOSPITAL_COMMUNITY): Payer: Self-pay

## 2024-06-18 ENCOUNTER — Other Ambulatory Visit (HOSPITAL_COMMUNITY): Payer: Self-pay

## 2024-06-18 ENCOUNTER — Other Ambulatory Visit: Payer: Self-pay

## 2024-06-18 MED FILL — Clopidogrel Bisulfate Tab 75 MG (Base Equiv): ORAL | 90 days supply | Qty: 90 | Fill #0 | Status: AC

## 2024-07-06 ENCOUNTER — Ambulatory Visit (HOSPITAL_BASED_OUTPATIENT_CLINIC_OR_DEPARTMENT_OTHER): Payer: Self-pay | Admitting: Physical Therapy

## 2024-07-13 ENCOUNTER — Encounter (HOSPITAL_BASED_OUTPATIENT_CLINIC_OR_DEPARTMENT_OTHER): Payer: Self-pay | Admitting: Physical Therapy

## 2024-07-20 ENCOUNTER — Encounter (HOSPITAL_BASED_OUTPATIENT_CLINIC_OR_DEPARTMENT_OTHER): Payer: Self-pay | Admitting: Physical Therapy

## 2024-09-07 ENCOUNTER — Ambulatory Visit: Admitting: Podiatry

## 2024-10-26 ENCOUNTER — Ambulatory Visit (HOSPITAL_BASED_OUTPATIENT_CLINIC_OR_DEPARTMENT_OTHER): Admitting: Cardiovascular Disease
